# Patient Record
Sex: Female | Born: 1940 | Race: White | Hispanic: No | Marital: Married | Smoking: Former smoker
Health system: Southern US, Community
[De-identification: ages and names within clinical notes are randomized; demographics above are authoritative.]

## PROBLEM LIST (undated history)

## (undated) ENCOUNTER — Emergency Department (HOSPITAL_COMMUNITY): Admission: EM | Payer: Medicare Other

## (undated) DIAGNOSIS — Z923 Personal history of irradiation: Secondary | ICD-10-CM

## (undated) DIAGNOSIS — F419 Anxiety disorder, unspecified: Secondary | ICD-10-CM

## (undated) DIAGNOSIS — C3490 Malignant neoplasm of unspecified part of unspecified bronchus or lung: Secondary | ICD-10-CM

## (undated) DIAGNOSIS — J45909 Unspecified asthma, uncomplicated: Secondary | ICD-10-CM

## (undated) DIAGNOSIS — I10 Essential (primary) hypertension: Secondary | ICD-10-CM

## (undated) DIAGNOSIS — J449 Chronic obstructive pulmonary disease, unspecified: Secondary | ICD-10-CM

## (undated) DIAGNOSIS — Z8719 Personal history of other diseases of the digestive system: Secondary | ICD-10-CM

## (undated) DIAGNOSIS — G4730 Sleep apnea, unspecified: Secondary | ICD-10-CM

## (undated) HISTORY — PX: COLON RESECTION: SHX5231

## (undated) HISTORY — PX: CHOLECYSTECTOMY: SHX55

## (undated) HISTORY — DX: Essential (primary) hypertension: I10

## (undated) HISTORY — PX: OTHER SURGICAL HISTORY: SHX169

## (undated) HISTORY — DX: Unspecified asthma, uncomplicated: J45.909

## (undated) HISTORY — PX: TONSILLECTOMY: SUR1361

## (undated) HISTORY — PX: APPENDECTOMY: SHX54

---

## 2010-10-07 DIAGNOSIS — Z87442 Personal history of urinary calculi: Secondary | ICD-10-CM

## 2010-10-07 HISTORY — DX: Personal history of urinary calculi: Z87.442

## 2012-08-19 ENCOUNTER — Encounter: Payer: Self-pay | Admitting: Cardiovascular Disease

## 2016-01-01 DIAGNOSIS — G4733 Obstructive sleep apnea (adult) (pediatric): Secondary | ICD-10-CM | POA: Diagnosis present

## 2016-01-01 DIAGNOSIS — I493 Ventricular premature depolarization: Secondary | ICD-10-CM | POA: Insufficient documentation

## 2018-07-15 DIAGNOSIS — E669 Obesity, unspecified: Secondary | ICD-10-CM | POA: Insufficient documentation

## 2018-07-15 DIAGNOSIS — F17210 Nicotine dependence, cigarettes, uncomplicated: Secondary | ICD-10-CM | POA: Insufficient documentation

## 2019-03-08 ENCOUNTER — Telehealth: Payer: Self-pay | Admitting: Internal Medicine

## 2019-03-08 NOTE — Telephone Encounter (Signed)
Checked front desk and visit notes from [facility] were received. Called them at [phone] and talked with Jody, requested copy of chest xray results will be needed for the 03/10/19 appointment with Dr. Melvyn Novas. Jody said info will be faxed.  Called the patient to advise. She stated she will also bring her notes from her PCP to the visit as well.  Nothing further needed at this time.

## 2019-03-10 ENCOUNTER — Other Ambulatory Visit: Payer: Self-pay

## 2019-03-10 ENCOUNTER — Ambulatory Visit (INDEPENDENT_AMBULATORY_CARE_PROVIDER_SITE_OTHER): Payer: Medicare Other | Admitting: Internal Medicine

## 2019-03-10 ENCOUNTER — Encounter: Payer: Self-pay | Admitting: Internal Medicine

## 2019-03-10 VITALS — BP 136/76 | HR 70 | Temp 98.2°F | Ht 65.0 in | Wt 196.6 lb

## 2019-03-10 DIAGNOSIS — J449 Chronic obstructive pulmonary disease, unspecified: Secondary | ICD-10-CM | POA: Diagnosis not present

## 2019-03-10 DIAGNOSIS — F17210 Nicotine dependence, cigarettes, uncomplicated: Secondary | ICD-10-CM | POA: Diagnosis not present

## 2019-03-10 DIAGNOSIS — J441 Chronic obstructive pulmonary disease with (acute) exacerbation: Secondary | ICD-10-CM | POA: Insufficient documentation

## 2019-03-10 DIAGNOSIS — K432 Incisional hernia without obstruction or gangrene: Secondary | ICD-10-CM | POA: Insufficient documentation

## 2019-03-10 MED ORDER — BUDESONIDE-FORMOTEROL FUMARATE 160-4.5 MCG/ACT IN AERO: 2.0000 | INHALATION_SPRAY | Freq: Two times a day (BID) | RESPIRATORY_TRACT | 0 refills | Status: DC

## 2019-03-10 MED ORDER — BUDESONIDE-FORMOTEROL FUMARATE 160-4.5 MCG/ACT IN AERO: 2.0000 | INHALATION_SPRAY | Freq: Two times a day (BID) | RESPIRATORY_TRACT | 11 refills | Status: DC

## 2019-03-10 NOTE — Patient Instructions (Addendum)
The key is to stop smoking completely before smoking completely stops you! -For smoking cessation classes/info call [phone]      Plan A = Automatic = stop advair/ start Symbicort 160 Take 2 puffs first thing in am and then another 2 puffs about 12 hours later.    Work on inhaler technique:  relax and gently blow all the way out then take a nice smooth deep breath back in, triggering the inhaler at same time you start breathing in.  Hold for up to 5 seconds if you can. Blow out thru nose. Rinse and gargle with water when done.      Plan B = Backup Only use your albuterol inhaler as a rescue medication to be used if you can't catch your breath by resting or doing a relaxed purse lip breathing pattern.  - The less you use it, the better it will work when you need it. - Ok to use the inhaler up to 2 puffs  every 4 hours if you must but call for appointment if use goes up over your usual need - Don't leave home without it !!  (think of it like the spare tire for your car)   Plan C = Crisis - only use your albuterol nebulizer if you first try Plan B and it fails to help > ok to use the nebulizer up to every 4 hours but if start needing it regularly call for immediate appointment   Plan D = Doctor - call me if B and C not adequate  Plan E = ER - go to ER or call 911 if all else fails    Please remember to go to the lab department   for your tests - we will call you with the results when they are available.       Please schedule a follow up office visit in 4 weeks, sooner if needed  with all medications /inhalers/ solutions in hand so we can verify exactly what you are taking. This includes all medications from all doctors and over the counters

## 2019-03-10 NOTE — Assessment & Plan Note (Signed)
F/u by Alvino Chapel, Jule Ser   Would do better if loses wt and stops coughing but this is a tall order and suspect she'll be fine for surgery but would put off for now in absence of worse pain, obstructive symptoms.   Will readdress surgical clearance issues on return

## 2019-03-10 NOTE — Assessment & Plan Note (Signed)
Counseled re importance of smoking cessation but did not meet time criteria for separate billing    Total time devoted to counseling  > 50 % of initial 60 min office visit:  reviewed case with pt/   device teaching which extended face to face time for this visit discussion of options/alternatives/ personally creating written customized instructions  in presence of pt  then going over those specific  Instructions directly with the pt including how to use all of the meds but in particular covering each new medication in detail and the difference between the maintenance= "automatic" meds and the prns using an action plan format for the latter (If this problem/symptom => do that organization reading Left to right).  Please see AVS from this visit for a full list of these instructions which I personally wrote for this pt and  are unique to this visit.

## 2019-03-10 NOTE — Progress Notes (Signed)
Laurie Mccoy, female    DOB: 04/30/1941     MRN: [id number]   Brief patient profile:  89 yowf active smoker from [place] moved to [place] to live near her sons/grandchildren with onset around 1990 doe worse in hot/humid weather and with uri's much worse in winter of 2020 while on ACEi but blood pressure got so low and better since but wants to be cleared for umbilical surgery  in [place] wanted her cleared for 3rd surgery (original was for diverticulitis surgery) so referred to pulmonary clinic 03/10/2019 by Dr  Alvino Chapel.     History of Present Illness  03/10/2019  Pulmonary/ 1st office eval/Terricka Onofrio  Off acei x early May 2020  Chief Complaint  Patient presents with  . Pulmonary Consult    Referred by Dr. Charlane Ferretti for pulmonary clearance for hernia repair. Pt states she has had asthma "for a long time". She states having increased SOB since since she was sick in Feb 2020.   Dyspnea:  Slowed by feet > sob at present Cough: advair irritation mouth throat but really no cough since off acei  Sleep: on side electric bed just a little / cpap  SABA use: albuterol 4-5 mdi/ never neb anymore Singulair could not take bad dreams    No obvious day to day or daytime variability or assoc excess/ purulent sputum or mucus plugs or hemoptysis or cp or chest tightness, subjective wheeze or overt sinus or hb symptoms.   Sleeping ok  without nocturnal  or early am exacerbation  of respiratory  c/o's or need for noct saba. Also denies any obvious fluctuation of symptoms with weather or environmental changes or other aggravating or alleviating factors except as outlined above   No unusual exposure hx or h/o childhood pna/ asthma or knowledge of premature birth.  Current Allergies, Complete Past Medical History, Past Surgical History, Family History, and Social History were reviewed in [employer] record.  ROS  The following are not active complaints unless  bolded Hoarseness, sore throat, dysphagia, dental problems, itching, sneezing,  nasal congestion or discharge of excess mucus or purulent secretions, ear ache,   fever, chills, sweats, unintended wt loss or wt gain, classically pleuritic or exertional cp,  orthopnea pnd or arm/hand swelling  or leg swelling, presyncope, palpitations, abdominal pain, anorexia, nausea, vomiting, diarrhea  or change in bowel habits or change in bladder habits, change in stools or change in urine, dysuria, hematuria,  rash, arthralgias, visual complaints, headache, numbness, weakness or ataxia or problems with walking or coordination,  change in mood or  memory.          No past medical history on file.  Outpatient Medications Prior to Visit  Medication Sig Dispense Refill  . albuterol (PROAIR HFA) 108 (90 Base) MCG/ACT inhaler Inhale 2 puffs into the lungs every 4 (four) hours as needed for wheezing or shortness of breath.    . Fluticasone-Salmeterol (ADVAIR) 500-50 MCG/DOSE AEPB Inhale 1 puff into the lungs 2 (two) times daily.    [name] loratadine (CLARITIN) 10 MG tablet Take 10 mg by mouth daily.        Objective:     BP 136/76 (BP Location: Left Arm, Cuff Size: Normal)   Pulse 70   Temp 98.2 F (36.8 C) (Oral)   Ht 5\' 5"  (1.651 m)   Wt 196 lb 9.6 oz (89.2 kg)   SpO2 94%   BMI 32.72 kg/m   SpO2: 94 %  RA  Obese pleasant amb wf nad   HEENT: nl dentition, turbinates bilaterally, and oropharynx. Nl external ear canals without cough reflex   NECK :  without JVD/Nodes/TM/ nl carotid upstrokes bilaterally   LUNGS: no acc muscle use,  Nl contour chest Exp wheeze better with plm   without cough on insp or exp maneuvers   CV:  RRR  no s3 or murmur or increase in P2, and no edema   ABD:  Obese/soft and nontender with nl inspiratory excursion in the supine position. No bruits or organomegaly appreciated, bowel sounds nl - Ventral defect slt to R of midline scar   MS:  Nl gait/ ext warm without  deformities, calf tenderness, cyanosis or clubbing No obvious joint restrictions   SKIN: warm and dry without lesions    NEURO:  alert, approp, nl sensorium with  no motor or cerebellar deficits apparent.    cxr reported nl by pt w/in last 4 weeks of ov but not available on canopy to review      Assessment   COPD GOLD 3  Active smoker Spirometry 12/28/2018  FEV1 0.82 (37%)  Ratio 0.56 with classic curvature - d/c acei around 1st of May 2020 - d/c advair 500 03/10/2019   - 03/10/2019  After extensive coaching inhaler device,  effectiveness =    90% so try symbicort 160 2bid   I doubt she's as bad as spirometry suggests but clearly should not be continued on high dose advair having been successfully liberated from ACEi effects (which cause very similar symptoms)   rec symb 160 2bid  saba prn  Ov in 4 weeks with spirometry if COVID - 19 restrictions have been lifted.     Ventral hernia, recurrent F/u by Alvino Chapel, Jule Ser   Would do better if loses wt and stops coughing but this is a tall order and suspect she'll be fine for surgery but would put off for now in absence of worse pain, obstructive symptoms.   Will readdress surgical clearance issues on return     Cigarette smoker Counseled re importance of smoking cessation but did not meet time criteria for separate billing       Total time devoted to counseling  > 50 % of initial 60 min office visit:  reviewed case with pt/   device teaching which extended face to face time for this visit discussion of options/alternatives/ personally creating written customized instructions  in presence of pt  then going over those specific  Instructions directly with the pt including how to use all of the meds but in particular covering each new medication in detail and the difference between the maintenance= "automatic" meds and the prns using an action plan format for the latter (If this problem/symptom => do that organization  reading Left to right).  Please see AVS from this visit for a full list of these instructions which I personally wrote for this pt and  are unique to this visit.      Christinia Gully, MD 03/10/2019

## 2019-03-10 NOTE — Assessment & Plan Note (Signed)
Active smoker Spirometry 12/28/2018  FEV1 0.82 (37%)  Ratio 0.56 with classic curvature - d/c acei around 1st of May 2020 - d/c advair 500 03/10/2019   - 03/10/2019  After extensive coaching inhaler device,  effectiveness =    90% so try symbicort 160 2bid   I doubt she's as bad as spirometry suggests but clearly should not be continued on high dose advair having been successfully liberated from ACEi effects (which cause very similar symptoms)   rec symb 160 2bid  saba prn  Ov in 4 weeks with spirometry if COVID - 19 restrictions have been lifted.

## 2019-03-11 LAB — SAR COV2 SEROLOGY (COVID19)AB(IGG),IA: SARS CoV2 AB IGG: NEGATIVE

## 2019-03-11 NOTE — Progress Notes (Signed)
Spoke with pt and notified of results per Dr. Wert. Pt verbalized understanding and denied any questions. 

## 2019-03-17 LAB — ALPHA-1 ANTITRYPSIN PHENOTYPE: A-1 Antitrypsin, Ser: 129 mg/dL (ref 83–199)

## 2019-04-12 ENCOUNTER — Encounter: Payer: Self-pay | Admitting: Internal Medicine

## 2019-04-12 ENCOUNTER — Ambulatory Visit (INDEPENDENT_AMBULATORY_CARE_PROVIDER_SITE_OTHER): Payer: Medicare Other | Admitting: Internal Medicine

## 2019-04-12 ENCOUNTER — Other Ambulatory Visit: Payer: Self-pay

## 2019-04-12 DIAGNOSIS — F17210 Nicotine dependence, cigarettes, uncomplicated: Secondary | ICD-10-CM | POA: Diagnosis not present

## 2019-04-12 DIAGNOSIS — J449 Chronic obstructive pulmonary disease, unspecified: Secondary | ICD-10-CM | POA: Diagnosis not present

## 2019-04-12 MED ORDER — TRELEGY ELLIPTA 100-62.5-25 MCG/INH IN AEPB: 1.0000 | INHALATION_SPRAY | Freq: Every day | RESPIRATORY_TRACT | 11 refills | Status: DC

## 2019-04-12 MED ORDER — TRELEGY ELLIPTA 100-62.5-25 MCG/INH IN AEPB: 1.0000 | INHALATION_SPRAY | Freq: Every day | RESPIRATORY_TRACT | 0 refills | Status: DC

## 2019-04-12 NOTE — Progress Notes (Signed)
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     History of Present Illness  03/10/2019  Pulmonary/ 1st office eval/Laurie Mccoy  Off acei x early May 2020  Chief Complaint  Patient presents with  . Pulmonary Consult    Referred by Dr. Charlane Ferretti for pulmonary clearance for hernia repair. Pt states she has had asthma "for a long time". She states having increased SOB since since she was sick in Feb 2020.   Dyspnea:  Slowed by feet > sob at present Cough: advair irritation mouth throat but really no cough since off acei  Sleep: on side electric bed just a little / cpap  SABA use: albuterol 4-5 mdi/ never neb anymore Singulair could not take bad dreams   rec The key is to stop smoking completely before smoking completely stops you! -For smoking cessation classes/info call [phone]    Plan A = Automatic = stop advair/ start Symbicort 160 Take 2 puffs first thing in am and then another 2 puffs about 12 hours later.  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 Follow-up    Good days and bad days with her breathing- depends on the heat and humidity. She is using her proair 5 x daily on average. She has decided that she wants to stop smoking and  plans to stop end of this wk.   Dyspnea:  No regular ex/ limited by feet hurting x 1.5 y Cough: none Sleeping: on cpap does fines, but when starts day is sob SABA use: never needs at rest  02: none    No obvious day to day or daytime variability or assoc excess/ purulent sputum or mucus plugs or hemoptysis or cp or chest tightness, subjective wheeze or overt sinus or hb symptoms.   Sleeping as above without nocturnal   of respiratory  c/o's or need for noct saba. Also denies any obvious fluctuation of symptoms with weather or environmental changes or other aggravating or alleviating factors except as outlined above   No unusual exposure hx or h/o childhood pna/ asthma or knowledge of premature birth.  Current Allergies, Complete Past Medical History, Past Surgical History, Family History, and Social History were reviewed in [employer] record.  ROS  The following are not active complaints unless bolded Hoarseness, sore throat, dysphagia, dental problems, itching, sneezing,  nasal congestion or discharge of excess mucus or purulent secretions, ear ache,   fever, chills, sweats, unintended wt loss or wt gain, classically pleuritic or exertional cp,  orthopnea pnd or arm/hand swelling  or leg swelling, presyncope, palpitations, abdominal pain, anorexia, nausea, vomiting, diarrhea  or change in bowel habits or change  in bladder habits, change in stools or change in urine, dysuria, hematuria,  rash, arthralgias, visual complaints, headache, numbness, weakness or ataxia or problems with walking or coordination,  change in mood or  memory.        Current Meds  Medication Sig  . albuterol (PROAIR HFA) 108 (90 Base) MCG/ACT inhaler Inhale 2 puffs into the lungs every 4 (four) hours as needed for wheezing or shortness of breath.  . ALPRAZolam (XANAX) 0.25 MG tablet Take 0.25 mg by mouth 2 (two) times daily as needed for anxiety.  . budesonide-formoterol (SYMBICORT) 160-4.5  MCG/ACT inhaler Inhale 2 puffs into the lungs 2 (two) times daily.  [name] loratadine (CLARITIN) 10 MG tablet Take 10 mg by mouth daily.                      Objective:     pleasant amb wf nad  Wt Readings from Last 3 Encounters:  04/12/19 195 lb 12.8 oz (88.8 kg)  03/10/19 196 lb 9.6 oz (89.2 kg)     Vital signs reviewed - Note on arrival 02 sats  94% on RA      . HEENT: nl dentition / oropharynx. Nl external ear canals without cough reflex -  Mild bilateral non-specific turbinate edema     NECK :  without JVD/Nodes/TM/ nl carotid upstrokes bilaterally   LUNGS: no acc muscle use,  Min barrel  contour chest wall with bilateral  slightly decreased bs s audible wheeze and  without cough on insp or exp maneuver and min  Hyperresonant  to  percussion bilaterally     CV:  RRR  no s3 or murmur or increase in P2, and no edema   ABD:  Mod obese soft and nontender with pos end  insp Hoover's  in the supine position. No bruits or organomegaly appreciated, bowel sounds nl - ventral defect slt to right of midline scar  MS:   Nl gait/  ext warm without deformities, calf tenderness, cyanosis or clubbing No obvious joint restrictions   SKIN: warm and dry without lesions    NEURO:  alert, approp, nl sensorium with  no motor or cerebellar deficits apparent.         cxr 12/28/18 in care everywhere: nl        Assessment

## 2019-04-12 NOTE — Patient Instructions (Addendum)
Plan A = Automatic = Trelegy one click each  - take smooth deep breaths x 2 to make sure you get it all   Rinse and gargle when done   The key is to stop smoking completely before smoking completely stops you!      Plan B = Backup Only use your albuterol inhaler as a rescue medication to be used if you can't catch your breath by resting or doing a relaxed purse lip breathing pattern.  - The less you use it, the better it will work when you need it. - Ok to use the inhaler up to 2 puffs  every 4 hours if you must but call for appointment if use goes up over your usual need - Don't leave home without it !!  (think of it like the spare tire for your car)   Plan C = Crisis - only use your albuterol nebulizer if you first try Plan B and it fails to help > ok to use the nebulizer up to every 4 hours but if start needing it regularly call for immediate appointment    Please schedule a follow up visit in 3 months but call sooner if needed

## 2019-04-13 ENCOUNTER — Encounter: Payer: Self-pay | Admitting: Internal Medicine

## 2019-04-13 MED ORDER — ALBUTEROL SULFATE (2.5 MG/3ML) 0.083% IN NEBU: 2.5000 mg | INHALATION_SOLUTION | RESPIRATORY_TRACT | Status: DC | PRN

## 2019-04-13 NOTE — Assessment & Plan Note (Signed)
Active smoker Spirometry 12/28/2018  FEV1 0.82 (37%)  Ratio 0.56 with classic curvature - d/c acei around 1st of May 2020 - d/c advair 500 03/10/2019   - 03/10/2019    try symbicort 160 2bid  But worse sob, more saba by 04/12/2019  - alpha one AT  03/10/19   MM   Level 129  - 04/12/2019  After extensive coaching inhaler device,  effectiveness =    90% with elipta, try trelegy    Group D in terms of symptom/risk and laba/lama/ICS  therefore appropriate rx at this point >>>  trelegy one each am and see if improves ex tol, reduces saba use

## 2019-04-13 NOTE — Assessment & Plan Note (Signed)

## 2019-05-06 ENCOUNTER — Telehealth: Payer: Self-pay | Admitting: Internal Medicine

## 2019-05-06 NOTE — Telephone Encounter (Signed)
Left message for patient to call back  

## 2019-05-06 NOTE — Telephone Encounter (Signed)
Primary Pulmonologist: MW Last office visit and with whom: 04/12/2019 with MW What do we see them for (pulmonary problems): COPD Last OV assessment/plan: Instructions  Plan A = Automatic = Trelegy one click each  - take smooth deep breaths x 2 to make sure you get it all   Rinse and gargle when done   The key is to stop smoking completely before smoking completely stops you!     Plan B = Backup Only use your albuterol inhaler as a rescue medication to be used if you can't catch your breath by resting or doing a relaxed purse lip breathing pattern.  - The less you use it, the better it will work when you need it. - Ok to use the inhaler up to 2 puffs  every 4 hours if you must but call for appointment if use goes up over your usual need - Don't leave home without it !!  (think of it like the spare tire for your car)   Plan C = Crisis - only use your albuterol nebulizer if you first try Plan B and it fails to help > ok to use the nebulizer up to every 4 hours but if start needing it regularly call for immediate appointment    Please schedule a follow up visit in 3 months but call sooner if needed      Was appointment offered to patient (explain)?  Pt wants recommendations   Reason for call: Called and spoke with pt who believes she is having a reaction to the Trelegy inhaler.   Pt stated that she has had swelling in her legs, rapid heartbeat. Pt used to be on BP meds but stated that her BP had become too low so she was taken off of BP meds. From looking at [place] from another provider, at visit on 5/27 pt's lisinopril was not listed and it also was not on pt's first consult OV with MW nor was it on her med list last OV 7/6 with MW. Pt's BP two days ago was 175/75. I had pt take her BP while talking to her and her current BP was 181/85 and pulse is 101. Pt did take her lisinopril about an hour ago.  Pt did recheck her BP while I was still talking to her and the updated BP was 166/80  and pulse was 90.  Pt also has had a headache at the rt temple region. Pt said that her symptoms began 1 week after OV with MW 7/6.   Pt said that she did stop using the trelegy two days ago and began taking symbicort again. Pt has not had to use her rescue inhaler or neb machine any. Pt said that today, 7/30, her legs are not swollen and she does not feel like her heart is racing anymore.  Pt is still having a headache. Pt denies any current complaints of fever but stated a week ago she did have a temp and flu like symptoms and was tested for covid at an urgent care and the results did come back negative.  Pt denies any complaints of nausea or vomiting and also has no abdominal or muscle pain.  Pt does have complaints of feeling weak and being tired and also has an occ cough and will occ get up a small amount of clear phlegm.  Pt is wanting recs to help with her symptoms. Aaron Edelman, please advise on this for pt. Thanks!

## 2019-05-06 NOTE — Telephone Encounter (Signed)
Okay to stop Trelegy Ellipta inhaler, resume Symbicort 160.I would recommend the patient have a tele-visit or office visit with an APP to further evaluate symptoms  Wyn Quaker, FNP

## 2019-05-07 ENCOUNTER — Ambulatory Visit: Payer: Medicare Other | Admitting: Nurse Practitioner

## 2019-05-07 NOTE — Telephone Encounter (Signed)
Called spoke with patient , made OV today with TN to go over medication management.   Nothing further needed. Negative covid questions and negative swab last Friday.

## 2019-05-10 ENCOUNTER — Encounter: Payer: Self-pay | Admitting: Pulmonary Disease

## 2019-05-10 ENCOUNTER — Other Ambulatory Visit: Payer: Self-pay

## 2019-05-10 ENCOUNTER — Ambulatory Visit (INDEPENDENT_AMBULATORY_CARE_PROVIDER_SITE_OTHER): Payer: Medicare Other | Admitting: Pulmonary Disease

## 2019-05-10 VITALS — BP 138/78 | HR 78 | Temp 98.3°F | Ht 64.0 in | Wt 197.4 lb

## 2019-05-10 DIAGNOSIS — J449 Chronic obstructive pulmonary disease, unspecified: Secondary | ICD-10-CM

## 2019-05-10 DIAGNOSIS — F17210 Nicotine dependence, cigarettes, uncomplicated: Secondary | ICD-10-CM | POA: Diagnosis not present

## 2019-05-10 DIAGNOSIS — Z23 Encounter for immunization: Secondary | ICD-10-CM | POA: Diagnosis not present

## 2019-05-10 MED ORDER — BUDESONIDE-FORMOTEROL FUMARATE 160-4.5 MCG/ACT IN AERO: 2.0000 | INHALATION_SPRAY | Freq: Two times a day (BID) | RESPIRATORY_TRACT | 6 refills | Status: DC

## 2019-05-10 NOTE — Progress Notes (Signed)
@Patient  ID: Laurie Mccoy, female    DOB: Jan 29, 1941, 78 y.o.   MRN: [id number]  Chief Complaint  Patient presents with  . Follow-up    COPD, back on symbicort / had reaction trelegy ellipta    Referring provider: No ref. provider found  HPI:  78 year old female current every day smoker followed in our office for COPD  PMH: allergic rhinitis, remote episodes of depression Smoker/ Smoking History: Current every day smoker.  46-pack-year smoking history.  Smoking half pack per day. Maintenance: Symbicort 160 Pt of: Dr. Melvyn Novas  05/10/2019  - Visit   78 year old female current every day smoker followed in our office for suspected COPD.  Patient has a previous spirometry listed in our chart from March/2020 (she reports that this was actually done over a year ago) that shows an FEV1 of 0.82.  Patient has never had formal pulmonary function testing.  At last office visit in July/2020 patient was transitioned from Symbicort 160  to [place].  Patient reports that she took that for 2 to 3 weeks and tolerated quite well.  Patient reports that she had leg swelling as well as heart palpitations and chest pain.  Patient also reports that her blood pressure was quite elevated and she attributes this to her Trelegy Ellipta.  Patient then decided to take to lisinopril that she had from her previous prescription to help lower her blood pressure.  She did not notify our office or notify primary care to receive recommendations regarding blood pressure management.  MMRC - Breathlessness Score 2 - on level ground, I walk slower than people of the same age because of breathlessness, or have to stop for breathe when walking to my own pace   Tests:   03/10/2019-SARS-CoV-2-negative  03/10/2019-alpha-1-129, antitrypsin phenotype is PI*MM  Spirometry 12/28/2018  FEV1 0.82 (37%)  Ratio 0.56 with classic curvature  (Pt says this was over 1 year ago)   FENO:  No results found for: NITRICOXIDE  PFT: No  flowsheet data found.  Imaging: No results found.    Specialty Problems      Pulmonary Problems   COPD GOLD 3     Active smoker Spirometry 12/28/2018  FEV1 0.82 (37%)  Ratio 0.56 with classic curvature - d/c acei around 1st of May 2020 - d/c advair 500 03/10/2019   - 03/10/2019    try symbicort 160 2bid  But worse sob, more saba by 04/12/2019  - alpha one AT  03/10/19   MM   Level 129  - 04/12/2019  After extensive coaching inhaler device,  effectiveness =    90% with elipta, try trelegy  05/10/2019 >>> patient reported reaction to Trelegy Ellipta, transition back to Symbicort 05/10/2019 >>> pulmonary function testing ordered         Allergies  Allergen Reactions  . Compazine [Prochlorperazine Edisylate]     "I look like I have had a stroke"    Immunization History  Administered Date(s) Administered  . Influenza,inj,quad, With Preservative 09/17/2017  . Influenza-Unspecified 07/07/2018  . Pneumococcal Polysaccharide-23 05/10/2019    History reviewed. No pertinent past medical history.  Tobacco History: Social History   Tobacco Use  Smoking Status Current Every Day Smoker  . Packs/day: 1.00  . Years: 46.00  . Pack years: 46.00  Smokeless Tobacco Never Used   Ready to quit: No Counseling given: Yes  Smoking assessment and cessation counseling  Patient currently smoking: 0.5 ppd  I have advised the patient to quit/stop smoking as soon as possible  due to high risk for multiple medical problems.  It will also be very difficult for [place] to manage patient's  respiratory symptoms and status if we continue to expose her lungs to a known irritant.  We do not advise e-cigarettes as a form of stopping smoking.  Patient is not willing to quit smoking.  I have advised the patient that we can assist and have options of nicotine replacement therapy, provided smoking cessation education today, provided smoking cessation counseling, and provided cessation resources. Tried chantix - poor  reaction, didn't tolerate.   Follow-up next office visit office visit for assessment of smoking cessation.  Smoking cessation counseling advised for: 5 min    Outpatient Encounter Medications as of 05/10/2019  Medication Sig  . albuterol (PROAIR HFA) 108 (90 Base) MCG/ACT inhaler Inhale 2 puffs into the lungs every 4 (four) hours as needed for wheezing or shortness of breath.  [name] albuterol (PROVENTIL) (2.5 MG/3ML) 0.083% nebulizer solution Take 3 mLs (2.5 mg total) by nebulization every 4 (four) hours as needed for wheezing or shortness of breath.  . ALPRAZolam (XANAX) 0.25 MG tablet Take 0.25 mg by mouth 2 (two) times daily as needed for anxiety.  . budesonide-formoterol (SYMBICORT) 160-4.5 MCG/ACT inhaler Inhale 2 puffs into the lungs 2 (two) times daily.  [name] loratadine (CLARITIN) 10 MG tablet Take 10 mg by mouth daily.  . budesonide-formoterol (SYMBICORT) 160-4.5 MCG/ACT inhaler Inhale 2 puffs into the lungs 2 (two) times daily.  . Fluticasone-Umeclidin-Vilant (TRELEGY ELLIPTA) 100-62.5-25 MCG/INH AEPB Inhale 1 puff into the lungs daily.   No facility-administered encounter medications on file as of 05/10/2019.     Review of Systems  Review of Systems  Constitutional: Negative for activity change, fatigue and fever.  HENT: Negative for sinus pressure, sinus pain and sore throat.   Respiratory: Positive for shortness of breath. Negative for cough (occasionally productive cough with clear mucous) and wheezing.   Cardiovascular: Negative for chest pain, palpitations and leg swelling.  Gastrointestinal: Negative for diarrhea, nausea and vomiting.  Musculoskeletal: Positive for gait problem.       Nueropathy in feet  Neurological: Negative for dizziness.  Psychiatric/Behavioral: Negative for sleep disturbance. The patient is nervous/anxious.      Physical Exam  BP 138/78 (BP Location: Left Arm, Cuff Size: Normal)   Pulse 78   Temp 98.3 F (36.8 C) (Oral)   Ht 5\' 4"  (1.626 m)   Wt 197  lb 6.4 oz (89.5 kg)   SpO2 96%   BMI 33.88 kg/m   Wt Readings from Last 5 Encounters:  05/10/19 197 lb 6.4 oz (89.5 kg)  04/12/19 195 lb 12.8 oz (88.8 kg)  03/10/19 196 lb 9.6 oz (89.2 kg)    Physical Exam Vitals signs and nursing note reviewed.  Constitutional:      General: She is not in acute distress.    Appearance: Normal appearance.  HENT:     Head: Normocephalic and atraumatic.     Right Ear: Tympanic membrane, ear canal and external ear normal. There is no impacted cerumen.     Left Ear: Tympanic membrane, ear canal and external ear normal. There is no impacted cerumen.     Nose: Nose normal. No congestion or rhinorrhea.     Mouth/Throat:     Mouth: Mucous membranes are moist.     Pharynx: Oropharynx is clear.  Eyes:     Pupils: Pupils are equal, round, and reactive to light.  Neck:     Musculoskeletal: Normal range of  motion.  Cardiovascular:     Rate and Rhythm: Normal rate and regular rhythm.     Pulses: Normal pulses.     Heart sounds: Normal heart sounds. No murmur.  Pulmonary:     Effort: Pulmonary effort is normal. No respiratory distress.     Breath sounds: Normal breath sounds. No decreased air movement. No decreased breath sounds, wheezing or rales.     Comments: A few scattered rhonchi that clear with coughing Abdominal:     General: Abdomen is flat. Bowel sounds are normal.     Palpations: Abdomen is soft.     Comments: Obese abdomen  Musculoskeletal:     Right lower leg: No edema.     Left lower leg: No edema.  Skin:    General: Skin is warm and dry.     Capillary Refill: Capillary refill takes less than 2 seconds.  Neurological:     General: No focal deficit present.     Mental Status: She is alert and oriented to person, place, and time. Mental status is at baseline.     Gait: Gait normal.  Psychiatric:        Mood and Affect: Mood is anxious.        Behavior: Behavior normal.        Thought Content: Thought content normal.        Judgment:  Judgment normal.     Lab Results:  CBC No results found for: WBC, RBC, HGB, HCT, PLT, MCV, MCH, MCHC, RDW, LYMPHSABS, MONOABS, EOSABS, BASOSABS  BMET No results found for: NA, K, CL, CO2, GLUCOSE, BUN, CREATININE, CALCIUM, GFRNONAA, GFRAA  BNP No results found for: BNP  ProBNP No results found for: PROBNP    Assessment & Plan:   Cigarette smoker Discussed multiple times the need to stop smoking Patient is not interested in smoking cessation today Currently smoking half a pack a day  Plan: Emphasized the need to stop smoking Patient to have pulmonary function test Patient to contact our office if and when she decides to stop smoking Consider referral to lung cancer screening program based off of patient's pulmonary function test to evaluate for suspected emphysema  COPD GOLD 3  Plan: Continue Symbicort 160 Could consider transition to AstraZen[employer] triple therapy inhaler when released in September/2020 We will order pulmonary function testing Continue rescue inhaler use as needed Pneumovax 23 today     Return in about 6 weeks (around 06/21/2019), or if symptoms worsen or fail to improve, for Follow up with Dr. Melvyn Novas, Follow up for PFT.   Lauraine Rinne, NP 05/10/2019   This appointment was 32 minutes long with over 50% of the time in direct face-to-face patient care, assessment, plan of care, and follow-up.

## 2019-05-10 NOTE — Patient Instructions (Addendum)
Pneumovax23 today   Continue Symbicort 160 >>> 2 puffs in the morning right when you wake up, rinse out your mouth after use, 12 hours later 2 puffs, rinse after use >>> Take this daily, no matter what >>> This is not a rescue inhaler   Only use your albuterol as a rescue medication to be used if you can't catch your breath by resting or doing a relaxed purse lip breathing pattern.  - The less you use it, the better it will work when you need it. - Ok to use up to 2 puffs  every 4 hours if you must but call for immediate appointment if use goes up over your usual need - Don't leave home without it !!  (think of it like the spare tire for your car)    You need a pulmonary function test on return visit    We recommend that you stop smoking.  >>>You need to set a quit date >>>If you have friends or family who smoke, let them know you are trying to quit and not to smoke around you or in your living environment  Smoking Cessation Resources:  1 800 QUIT NOW  >>> Patient to call this resource and utilize it to help support her quit smoking >>> Keep up your hard work with stopping smoking  You can also contact the [facility] >>>For smoking cessation classes call [phone]  We do not recommend using e-cigarettes as a form of stopping smoking  You can sign up for smoking cessation support texts and information:  >>>https://smokefree.gov/smokefreetxt    Return in about 6 weeks (around 06/21/2019), or if symptoms worsen or fail to improve, for Follow up with Dr. Melvyn Novas, Follow up for PFT.     Coronavirus (COVID-19) Are you at risk?  Are you at risk for the Coronavirus (COVID-19)?  To be considered HIGH RISK for Coronavirus (COVID-19), you have to meet the following criteria:  . Traveled to [place], [place], [place], [place] or [place]; or in the [place] to [place], [place], [place], or [place]; and have fever, cough, and shortness of breath within the last  2 weeks of travel OR . Been in close contact with a person diagnosed with COVID-19 within the last 2 weeks and have fever, cough, and shortness of breath . IF YOU DO NOT MEET THESE CRITERIA, YOU ARE CONSIDERED LOW RISK FOR COVID-19.  What to do if you are HIGH RISK for COVID-19?  [name] If you are having a medical emergency, call 911. . Seek medical care right away. Before you go to a doctor's office, urgent care or emergency department, call ahead and tell them about your recent travel, contact with someone diagnosed with COVID-19, and your symptoms. You should receive instructions from your physician's office regarding next steps of care.  . When you arrive at healthcare provider, tell the healthcare staff immediately you have returned from visiting [place], [place], [place], [place] or [place]; or traveled in the [place] to [place], [place], [place], or [place]; in the last two weeks or you have been in close contact with a person diagnosed with COVID-19 in the last 2 weeks.   . Tell the health care staff about your symptoms: fever, cough and shortness of breath. . After you have been seen by a medical provider, you will be either: o Tested for (COVID-19) and discharged home on quarantine except to seek medical care if symptoms worsen, and asked to  -  Stay home and avoid contact with others until you get your results (4-5 days)  - Avoid travel on public transportation if possible (such as bus, train, or airplane) or o Sent to the Emergency Department by EMS for evaluation, COVID-19 testing, and possible admission depending on your condition and test results.  What to do if you are LOW RISK for COVID-19?  Reduce your risk of any infection by using the same precautions used for avoiding the common cold or flu:  [name] Wash your hands often with soap and warm water for at least 20 seconds.  If soap and water are not readily available, use an alcohol-based hand sanitizer with at least 60%  alcohol.  . If coughing or sneezing, cover your mouth and nose by coughing or sneezing into the elbow areas of your shirt or coat, into a tissue or into your sleeve (not your hands). . Avoid shaking hands with others and consider head nods or verbal greetings only. . Avoid touching your eyes, nose, or mouth with unwashed hands.  . Avoid close contact with people who are sick. . Avoid places or events with large numbers of people in one location, like concerts or sporting events. . Carefully consider travel plans you have or are making. . If you are planning any travel outside or inside the [place], visit the CDC's Travelers' Health webpage for the latest health notices. . If you have some symptoms but not all symptoms, continue to monitor at home and seek medical attention if your symptoms worsen. . If you are having a medical emergency, call 911.   [place] / e-Visit: [email or web address]         [facility]: [facility]: [phone]                   [facility]: [phone]           It is flu season:   >>> Best ways to protect herself from the flu: Receive the yearly flu vaccine, practice good hand hygiene washing with soap and also using hand sanitizer when available, eat a nutritious meals, get adequate rest, hydrate appropriately   Please contact the office if your symptoms worsen or you have concerns that you are not improving.   Thank you for choosing [facility] for your healthcare, and for allowing [place] to partner with you on your healthcare journey. I am thankful to be able to provide care to you today.   Wyn Quaker FNP-C

## 2019-05-10 NOTE — Assessment & Plan Note (Addendum)
Discussed multiple times the need to stop smoking Patient is not interested in smoking cessation today Currently smoking half a pack a day  Plan: Emphasized the need to stop smoking Patient to have pulmonary function test Patient to contact our office if and when she decides to stop smoking Consider referral to lung cancer screening program based off of patient's pulmonary function test to evaluate for suspected emphysema

## 2019-05-10 NOTE — Assessment & Plan Note (Signed)
Plan: Continue Symbicort 160 Could consider transition to AstraZeneca triple therapy inhaler when released in September/2020 We will order pulmonary function testing Continue rescue inhaler use as needed Pneumovax 23 today

## 2019-05-17 ENCOUNTER — Telehealth: Payer: Self-pay | Admitting: Internal Medicine

## 2019-05-17 NOTE — Telephone Encounter (Signed)
Laurie Mccoy, please advise if you have received a form from pt's insurance in regards to the Symbicort. Thanks!

## 2019-05-18 NOTE — Telephone Encounter (Signed)
Pt is calling for an update about faxed request from [facility] for Symbicort. Cb is [phone].

## 2019-05-18 NOTE — Telephone Encounter (Signed)
Spoke with the pt  She states we need to call her insurance to get her symbicort approved  ID number [id number]  [id number] Will need to call tomorrow

## 2019-05-18 NOTE — Telephone Encounter (Signed)
I do not have a form  I just checked Dr Gustavus Bryant lookat  I spoke with the pt and notified that I did not receive anything  Pt states that she is in her basement and needs [place] to call her back b/c she does not have good service  [facility]

## 2019-05-19 NOTE — Telephone Encounter (Addendum)
ATC pt back, line continued to ring without a VM, then went to busy signal. ATC pt back a second time and line went to busy signal almost immediately. WCB.

## 2019-05-19 NOTE — Telephone Encounter (Signed)
Called and spoke to pt. Informed pt we do not currently have Symbicort samples at this time but she can call back later to see if we get more. Also, inquired if pt would be willing to see if Dr. Melvyn Novas had a similar interim inhaler sample to use until we get a decision back about her Symbicort, pt refused. Will await decision of pt's Symbicort.

## 2019-05-19 NOTE — Telephone Encounter (Signed)
Returned call to patient. Patient was requesting to speak with Laurie Mccoy or Dr. Melvyn Mccoy due to her needing a PA approval  for Symbicort and she states her insurance has 'faxed [place] a form' on 3 separate days this week.  She doesn't understand why this has not been taken care of already.  Advised patient that note from today states one of our clinical staff had requested information from the pharmacy to start the PA process.  She didn't not understand why we didn't get the forms from the insurance company and why we needed to call the pharmacy. Explained to patient that the other nurse was most likely just trying to make sure we were able to get a form today in the event she was unable to locate the faxed form from the insurance company.  Advised patient I would go look for the form and call her back for an update.    Located the form and Benetta Spar assisted me in submitting the form online.  Contacted the patient to inform her we had submitted the PA and should receive a follow up hopefully by tomorrow and we would call her once we get feedback on the request. Patient states she contacted the pharmacy to 'see why we would request info from them and the pharmacy told her we did not need to call them about PA's and that anyone could fill out a simple form and it didn't have to be a doctor or a nurse.'    Patient asked about the process for PA requests and patient messages stating 'it doesn't seem to work.'  She was concerned about changing meds and now she is 'allergic' to trelegy and can't get the symbicort.  She states our processes don't work. Apologized to the patient for delays and advised I could have a manager contact her regarding her concerns if she desires.  Patient states she would like that.  Patient also requested she be given a copy of the PA form as she wishes to review what is required to complete these forms.  Wallene Dales will mail the patient a copy.   Will route to B. Rakestraw, RN for follow up  with patient concerns.

## 2019-05-19 NOTE — Telephone Encounter (Signed)
PT CALLING BACK  NEEDING TO SPEAK TO NURSE A/B THIS  SOMETHING A/B INSURANCE MIX/MESS UP SHE CAN BE REACHED @ [phone].Hillery Hunter

## 2019-05-19 NOTE — Telephone Encounter (Signed)
Called and spoke to patient.  Patient was upset about PA not resulting yet.  Patient gave Laurie Mccoy ID [id number].  I called [phone] and answered the pending questions.  Received approval with case ID [id number] with dates of approval until 05/18/2020.  Called and let patient know of approval. Nothing further needed at this time.

## 2019-05-19 NOTE — Telephone Encounter (Signed)
Before calling pt's insurance, I called pt's pharmacy to see if pt's symbicort was requiring a PA. Per pharmacy, the symbicort is requiring a PA to be done. I provided them with our office fax and they said that they would be sending the PA to our office. Will keep open and await fax.

## 2019-05-19 NOTE — Telephone Encounter (Signed)
Pt requesting a call back from [place] or Dr. Melvyn Novas regarding symbicort.  [phone].

## 2019-05-19 NOTE — Telephone Encounter (Signed)
PT CALLING TO SEE IF WE HAVE SAMPLES OF SYMBICORT SHE CAN BE REACHED @ [phone].Laurie Mccoy

## 2019-05-23 NOTE — Progress Notes (Signed)
Chart and office note reviewed in detail  > agree with a/p as outlined    

## 2019-07-10 ENCOUNTER — Other Ambulatory Visit (HOSPITAL_COMMUNITY): Payer: Medicare Other

## 2019-07-13 ENCOUNTER — Ambulatory Visit: Payer: Medicare Other | Admitting: Internal Medicine

## 2019-07-15 ENCOUNTER — Telehealth: Payer: Self-pay | Admitting: Internal Medicine

## 2019-07-15 ENCOUNTER — Other Ambulatory Visit: Payer: Self-pay | Admitting: Internal Medicine

## 2019-07-15 MED ORDER — PREDNISONE 10 MG PO TABS: ORAL_TABLET | ORAL | 0 refills | Status: DC

## 2019-07-15 NOTE — Telephone Encounter (Signed)
My avs says we changed trelegy  To symbicort due to reaction to trelegy so we're studck fuor now  But would leave off symbicort and just take albuterol prn with hfa or neb form in short run  [place] to use benadryl prn   If not improving:  Prednisone 10 mg take  4 each am x 2 days,   2 each am x 2 days,  1 each am x 2 days and stop     Ov w/in 2 weeks with all meds in hand to regroup

## 2019-07-15 NOTE — Telephone Encounter (Signed)
Called and spoke to pt. Pt states she has been taking her Symbicort for about a month and feels she is having a reaction to it. Pt c/o slight increase in SOB, low back aches, joint pain, hoarseness, fatigue, mild dizziness x 1 week. Pt also noticed a small rash on her arm that has been present for a couple days. Pt denies swelling, f/c/s, CP/tightness. Pt states she has not stopped the Symbicort, I advised her to stop until we hear from MW. Pt states she also has not take an antihistamine but has Benadryl at home. Pt states she does not feel this warrants and ED or UC visit, she would like recs from MW.   Dr. Melvyn Novas please advise. Thanks.

## 2019-07-15 NOTE — Telephone Encounter (Signed)
Called and spoke to pt. Informed her of the recs per MW. Rx sent to preferred pharmacy. Pt also states she recently took her pressure and it was 169/78 and HR was 88,advised pt to keep an eye on her pressure and if it doesn't go down or increase to then call her PCP or seek emergency care. Pt verbalized understanding. Pt requesting to keep this 'on file'. Pt has been scheduled to see MW on 10/21. Nothing further needed at this time.

## 2019-07-28 ENCOUNTER — Other Ambulatory Visit: Payer: Self-pay

## 2019-07-28 ENCOUNTER — Ambulatory Visit (INDEPENDENT_AMBULATORY_CARE_PROVIDER_SITE_OTHER): Payer: Medicare Other | Admitting: Internal Medicine

## 2019-07-28 ENCOUNTER — Encounter: Payer: Self-pay | Admitting: Internal Medicine

## 2019-07-28 DIAGNOSIS — J449 Chronic obstructive pulmonary disease, unspecified: Secondary | ICD-10-CM

## 2019-07-28 DIAGNOSIS — I10 Essential (primary) hypertension: Secondary | ICD-10-CM

## 2019-07-28 DIAGNOSIS — F17210 Nicotine dependence, cigarettes, uncomplicated: Secondary | ICD-10-CM | POA: Diagnosis not present

## 2019-07-28 MED ORDER — TELMISARTAN 40 MG PO TABS: 40.0000 mg | ORAL_TABLET | Freq: Every day | ORAL | 2 refills | Status: DC

## 2019-07-28 NOTE — Progress Notes (Signed)
Laurie Mccoy, female    DOB: 1941/04/27     MRN: [id number]   Brief patient profile:  34 yowf active smoker prev worked with GP's  from [place] moved to [place] to live near her sons/grandchildren with onset around 1990 doe worse in hot/humid weather and with uri's much worse in winter of 2020 while on ACEi but blood pressure got so low and better since but wants to be cleared for umbilical surgery  in [place] wanted her cleared for 3rd surgery (original was for diverticulitis surgery) so referred to pulmonary clinic 03/10/2019 by Dr  Alvino Chapel.     History of Present Illness  03/10/2019  Pulmonary/ 1st office eval/Laurie Mccoy  Off acei x early May 2020  Chief Complaint  Patient presents with  . Pulmonary Consult    Referred by Dr. Charlane Ferretti for pulmonary clearance for hernia repair. Pt states she has had asthma "for a long time". She states having increased SOB since since she was sick in Feb 2020.   Dyspnea:  Slowed by feet > sob at present Cough: advair irritation mouth throat but really no cough since off acei  Sleep: on side electric bed just a little / cpap  SABA use: albuterol 4-5 mdi/ never neb anymore Singulair could not take bad dreams   rec The key is to stop smoking completely before smoking completely stops you! -For smoking cessation classes/info call [phone]    Plan A = Automatic = stop advair/ start Symbicort 160 Take 2 puffs first thing in am and then another 2 puffs about 12 hours later.  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 Follow-up    Good days and bad days with her breathing- depends on the heat and humidity. She is using her proair 5 x daily on average. She has decided that she wants to stop smoking and  plans to stop end of this wk.   Dyspnea:  No regular ex/ limited by feet hurting x 1.5 y Cough: none Sleeping: on cpap does fine, but when starts day is sob SABA use: never needs at rest  02: none  rec Plan A = Automatic = Trelegy one click each  - take smooth deep breaths x 2 to make sure you get it all  Rinse and gargle when done  The key is to stop smoking completely before smoking completely stops you! Plan B = Backup Only use your albuterol inhaler as a rescue medication Plan C = Crisis - only use your albuterol nebulizer if you first try Plan B  07/26/2019 eval at [facility] for atypical cp migratory / upper abd also  Listed as taking lisinopril but hasn't started it  Nl trop/ probnp / cxr  rec re start lisinopril See cards > scheduled for 07/30/19 and no cp since   07/28/2019  f/u ov/Laurie Mccoy re: GOLD III copd / can't tol trelegy or symbicort so just using saba hfa tid Chief Complaint  Patient presents with  . Follow-up  Dyspnea:  Still limited by feet/ able to walk  slow pace ok flat surfaces  MMRC2 = can't walk a nl pace on a flat grade s sob but does fine slow and flat  Cough:  none Sleeping: on cpap  SABA use: 3 x day hfa/ no neb needed  02: none    No obvious day to day or daytime variability or assoc excess/ purulent sputum or mucus plugs or hemoptysis or   chest tightness, subjective wheeze or overt sinus or hb symptoms.   Sleeping  without nocturnal  or early am exacerbation  of respiratory  c/o's or need for noct saba. Also denies any obvious fluctuation of symptoms with weather or environmental changes or other aggravating or alleviating factors except as outlined above   No unusual exposure hx or h/o childhood pna/ asthma or knowledge of premature birth.  Current Allergies, Complete Past Medical History, Past Surgical History, Family History, and Social History were reviewed in [employer] record.  ROS  The following are not active  complaints unless bolded Hoarseness, sore throat, dysphagia, dental problems, itching, sneezing,  nasal congestion or discharge of excess mucus or purulent secretions, ear ache,   fever, chills, sweats, unintended wt loss or wt gain, classically pleuritic or exertional cp,  orthopnea pnd or arm/hand swelling  or leg swelling, presyncope, palpitations, abdominal pain, anorexia, nausea, vomiting, diarrhea  or change in bowel habits or change in bladder habits, change in stools or change in urine, dysuria, hematuria,  rash, arthralgias, visual complaints, headache, numbness, weakness or ataxia or problems with walking or coordination,  change in mood or  memory.        Current Meds  Medication Sig  . albuterol (PROAIR HFA) 108 (90 Base) MCG/ACT inhaler Inhale 2 puffs into the lungs every 4 (four) hours as needed for wheezing or shortness of breath.  . loratadine (CLARITIN) 10 MG tablet Take 10 mg by mouth daily.                            Objective:        Wt Readings from Last 3 Encounters:  07/28/19 198 lb (89.8 kg)  05/10/19 197 lb 6.4 oz (89.5 kg)  04/12/19 195 lb 12.8 oz (88.8 kg)     Vital signs reviewed - Note on arrival 02 sats  96% on RA and bp  160/74    Somewhat anxious but pleasant amb wf nad   HEENT : pt wearing mask not removed for exam due to covid - 19 concerns.   NECK :  without JVD/Nodes/TM/ nl carotid upstrokes bilaterally   LUNGS: no acc muscle use,  Min barrel  contour chest wall with bilateral  slightly decreased bs s audible wheeze and  without cough on insp or exp maneuvers and min  Hyperresonant  to  percussion bilaterally     CV:  RRR  no s3 or murmur or increase in P2, and no edema   ABD:  soft and nontender with pos end  insp Hoover's  in the supine position. No bruits or organomegaly appreciated, bowel sounds nl  MS:   Nl gait/  ext warm without deformities, calf tenderness, cyanosis or clubbing No obvious joint restrictions   SKIN: warm  and dry without lesions    NEURO:  alert, approp, nl sensorium with  no motor or cerebellar deficits apparent.         I personally reviewed images and agree with radiology impression as follows:  CXR:   08/05/19  No acute findings      Assessment

## 2019-07-28 NOTE — Patient Instructions (Addendum)
Don't take lisinopril   Micardis 40 mg one half daily up to 2 whole pills  daily goal is to keep bp under 135 and 90 on bottom    Please schedule a follow up visit in 3 months but call sooner if needed  - add: correctioin: f/u 1 m with pfts same day

## 2019-07-29 ENCOUNTER — Encounter: Payer: Self-pay | Admitting: Internal Medicine

## 2019-07-29 DIAGNOSIS — I10 Essential (primary) hypertension: Secondary | ICD-10-CM | POA: Insufficient documentation

## 2019-07-29 NOTE — Assessment & Plan Note (Signed)
Counseled re importance of smoking cessation but did not meet time criteria for separate billing   °

## 2019-07-29 NOTE — Assessment & Plan Note (Addendum)
ACE inhibitors are problematic in  pts with airway complaints because  even experienced pulmonologists can't always distinguish ace effects from copd/asthma.  By themselves they don't actually cause a problem, much like oxygen can't by itself start a fire, but they certainly serve as a powerful catalyst or enhancer for any "fire"  or inflammatory process in the upper airway, be it caused by an ET  tube or more commonly reflux (especially in the obese or pts with known GERD or who are on biphoshonates).    In the era of ARB near equivalency until we have a better handle on airways dz rec micaridis 40 mg - she can take one half to 2 and self titrate as tends to have intolerance to stronger rx   >>> f/u with cards as planned 07/29/2019

## 2019-07-29 NOTE — Assessment & Plan Note (Addendum)
Active smoker Spirometry 12/28/2018  FEV1 0.82 (37%)  Ratio 0.56 with classic curvature - d/c acei around 1st of May 2020 - d/c advair 500 03/10/2019   - 03/10/2019    try symbicort 160 2bid  But worse sob, more saba by 04/12/2019  - alpha one AT  03/10/19   MM   Level 129  - 04/12/2019  After extensive coaching inhaler device,  effectiveness =    90% with elipta, try trelegy  05/10/2019 >>> patient reported reaction to Trelegy Ellipta, transition back to Symbicort> could not tolerate 05/10/2019 >>> pulmonary function testing ordered  Although she has GOLD III she is more limited by feet and not having tendency to aecopd off all maint rx.  Since can't tol either trelegy or symbicort ok with me just to use saba prn as long as not trending up in dependency or requiring neb saba/ reviewed    >>> f/u with pfts in 1 m

## 2019-07-29 NOTE — Progress Notes (Signed)
Cardiology Office Note:   Date:  07/30/2019  NAME:  Laurie Mccoy    MRN: [id number] DOB:  Apr 21, 1941   PCP:  [name], NP  Cardiologist:  No primary care provider on file.  Electrophysiologist:  None   Referring MD: [name], NP   Chief Complaint  Patient presents with  . Chest Pain   History of Present Illness:   Laurie Mccoy is a 78 y.o. female with a hx of hypertension, tobacco abuse, obesity who is being seen today for the evaluation of chest pain at the request of White, Delmar Landau, NP.  She reports she was seen in the emergency room on Monday for acute onset of chest tightness that also went into her neck.  She reports the pain was achy and sharp and also went into her back.  She reports this was around 6:30 in the evening and she was with significant stress at that time.  She had an extended weekend with many visitors in her home.  She reports a similar episode a number of years ago and it resolved and was attributed to anxiety.  The pain she had was better with an acid and she took 2 aspirin at the discretion of her husband.  She did go to the emergency room and ruled out for myocardial infarction.  She had no acute ischemic EKG changes.  She elected not to be admitted to undergo stress test.  Overall, she reports she is in fairly good health.  She reports she is not diabetic she does have a history of high blood pressure but takes medications infrequently.  Her EKG demonstrates no ischemic changes today no prior infarction.  She had a single PAC noted.  She reports she does not exercise routinely due to pain in her legs she has great pulses on exam, but does have some arthritis she is dealing with.  She also reports a long history of asthma since moving back to [place] from [place].  She reports the weather here does not conducive for her sinuses.  She is finally stopped all of her allergy medications and seems to be doing much better.  She reports when she does  exert herself she does not routinely get chest pain or chest pressure.  She does not really get short of breath either with activity.  She is inquired about pursuing activity that is better on the joints.  She has 3 sons and lives with her husband.  She does report her diet could be better and there can work on this.  Review of her laboratory data from her primary care physician shows a serum creatinine of 0.64, LDL 62, HDL 80!!,  TSH 3.4.  Past Medical History: Past Medical History:  Diagnosis Date  . Asthma   . Hypertension     Past Surgical History: Past Surgical History:  Procedure Laterality Date  . APPENDECTOMY    . CHOLECYSTECTOMY    . COLON RESECTION    . hernia      Current Medications: Current Meds  Medication Sig  . albuterol (PROAIR HFA) 108 (90 Base) MCG/ACT inhaler Inhale 2 puffs into the lungs every 4 (four) hours as needed for wheezing or shortness of breath.  [name] albuterol (PROVENTIL) (2.5 MG/3ML) 0.083% nebulizer solution Take 3 mLs (2.5 mg total) by nebulization every 4 (four) hours as needed for wheezing or shortness of breath.  . ALPRAZolam (XANAX) 0.25 MG tablet Take 0.25 mg by mouth 2 (two) times daily as needed  for anxiety.  [name] loratadine (CLARITIN) 10 MG tablet Take 10 mg by mouth daily.  [name] telmisartan (MICARDIS) 40 MG tablet Take 1 tablet (40 mg total) by mouth daily.     Allergies:    Compazine [prochlorperazine edisylate]   Social History: Social History   Socioeconomic History  . Marital status: Married    Spouse name: Not on file  . Number of children: Not on file  . Years of education: Not on file  . Highest education level: Not on file  Occupational History  . Not on file  Social Needs  . Financial resource strain: Not on file  . Food insecurity    Worry: Not on file    Inability: Not on file  . Transportation needs    Medical: Not on file    Non-medical: Not on file  Tobacco Use  . Smoking status: Current Every Day Smoker    Packs/day:  1.00    Years: 46.00    Pack years: 46.00  . Smokeless tobacco: Never Used  Substance and Sexual Activity  . Alcohol use: Not on file  . Drug use: Not on file  . Sexual activity: Not on file  Lifestyle  . Physical activity    Days per week: Not on file    Minutes per session: Not on file  . Stress: Not on file  Relationships  . Social Herbalist on phone: Not on file    Gets together: Not on file    Attends religious service: Not on file    Active member of club or organization: Not on file    Attends meetings of clubs or organizations: Not on file    Relationship status: Not on file  Other Topics Concern  . Not on file  Social History Narrative  . Not on file    Family History: The patient's family history includes Brain cancer in her mother; Lung cancer in her father and mother; Stroke in her brother.  ROS:   All other ROS reviewed and negative. Pertinent positives noted in the HPI.     EKGs/Labs/Other Studies Reviewed:   The following studies were personally reviewed by me today:  EKG:  EKG is ordered today.  The ekg ordered today demonstrates normal sinus rhythm, heart rate 82, PACs noted, no evidence of ischemia or prior infarction, and was personally reviewed by me.   Recent Labs: No results found for requested labs within last 8760 hours.   Recent Lipid Panel No results found for: CHOL, TRIG, HDL, CHOLHDL, VLDL, LDLCALC, LDLDIRECT  Physical Exam:   VS:  BP 128/62   Pulse 82   Ht 5\' 4"  (1.626 m)   Wt 197 lb (89.4 kg)   BMI 33.81 kg/m    Wt Readings from Last 3 Encounters:  07/30/19 197 lb (89.4 kg)  07/28/19 198 lb (89.8 kg)  05/10/19 197 lb 6.4 oz (89.5 kg)    General: Well nourished, well developed, in no acute distress Heart: Atraumatic, normal size  Eyes: PEERLA, EOMI  Neck: Supple, no JVD Endocrine: No thryomegaly Cardiac: Normal S1, S2; RRR; no murmurs, rubs, or gallops Lungs: Clear to auscultation bilaterally, no wheezing, rhonchi or  rales  Abd: Soft, nontender, no hepatomegaly  Ext: No edema, pulses 2+ Musculoskeletal: No deformities, BUE and BLE strength normal and equal Skin: Warm and dry, no rashes   Neuro: Alert and oriented to person, place, time, and situation, CNII-XII grossly intact, no focal deficits  Psych: Normal mood  and affect   ASSESSMENT:   Raphael Espe is a 78 y.o. female who presents for the following: 1. Precordial pain   2. Essential hypertension   3. Tobacco abuse     PLAN:   1. Precordial pain -She presents with a one-time episode of pretty atypical pain.  I suspect this is anxiety related or possibly GERD related.  It did improve with acid reflux medications and aspirin.  She does have risk factors including heavy smoking history.  And I discussed with her that women have quite varying presentations of coronary artery disease.  Therefore I think it is reasonable to proceed with a coronary CTA.  We will give her diltiazem 120 mg immediate release tablet 1 hour before scan.  She will get a BMP 1 week before the scan.  We will plan to see her back in 3 months after the scan. -She has great pulses on exam and no evidence of a carotid bruit.  I have a low suspicion for PAD given her very benign exam  2. Essential hypertension -Stable today off medication  3. Tobacco abuse -Counseled the importance of smoking cessation  Disposition: Return in about 3 months (around 10/30/2019).  Medication Adjustments/Labs and Tests Ordered: Current medicines are reviewed at length with the patient today.  Concerns regarding medicines are outlined above.  Orders Placed This Encounter  Procedures  . CT CORONARY MORPH W/CTA COR W/SCORE W/CA W/CM &/OR WO/CM  . CT CORONARY FRACTIONAL FLOW RESERVE DATA PREP  . CT CORONARY FRACTIONAL FLOW RESERVE FLUID ANALYSIS  . Basic metabolic panel  . EKG 12-Lead   Meds ordered this encounter  Medications  . diltiazem (CARDIZEM) 120 MG tablet    Sig: Take 1 tablet (120  mg total) by mouth once for 1 dose.    Dispense:  1 tablet    Refill:  0    Patient Instructions  Medication Instructions:  Take Diltiazem 120 mg 1 hour before the CTA. *If you need a refill on your cardiac medications before your next appointment, please call your pharmacy*  Lab Work: BMET one week before CTA is scheduled. If you have labs (blood work) drawn today and your tests are completely normal, you will receive your results only by: [name] MyChart Message (if you have MyChart) OR . A paper copy in the mail If you have any lab test that is abnormal or we need to change your treatment, we will call you to review the results.  Testing/Procedures: Your physician has requested that you have cardiac CT. Cardiac computed tomography (CT) is a painless test that uses an x-ray machine to take clear, detailed pictures of your heart. For further information please visit HugeFiesta.tn. Please follow instruction sheet as given.  Follow-Up: At [facility], you and your health needs are our priority.  As part of our continuing mission to provide you with exceptional heart care, we have created designated Provider Care Teams.  These Care Teams include your primary Cardiologist (physician) and Advanced Practice Providers (APPs -  Physician Assistants and Nurse Practitioners) who all work together to provide you with the care you need, when you need it.  Your next appointment:   3 months  The format for your next appointment:   In Person  Provider:   Dr.O'Neal  Other Instructions Your cardiac CT will be scheduled at one of the below locations:   [facility] [address] [place], [place] [address] [phone]  If scheduled at [facility], please  arrive at the [facility] main entrance of [facility] 30-45 minutes prior to test start time. Proceed to the [facility] Radiology Department (first floor) to check-in and test prep.  If scheduled at  [facility], please arrive 15 mins early for check-in and test prep.  Please follow these instructions carefully (unless otherwise directed):  Hold all erectile dysfunction medications at least 3 days (72 hrs) prior to test.  On the Night Before the Test: . Be sure to Drink plenty of water. . Do not consume any caffeinated/decaffeinated beverages or chocolate 12 hours prior to your test. . Do not take any antihistamines 12 hours prior to your test.  On the Day of the Test: . Drink plenty of water. Do not drink any water within one hour of the test. . Do not eat any food 4 hours prior to the test. . You may take your regular medications prior to the test.  . Take metoprolol (Lopressor) two hours prior to test. . HOLD Furosemide/Hydrochlorothiazide morning of the test. . FEMALES- please wear underwire-free bra if available       After the Test: . Drink plenty of water. . After receiving IV contrast, you may experience a mild flushed feeling. This is normal. . On occasion, you may experience a mild rash up to 24 hours after the test. This is not dangerous. If this occurs, you can take Benadryl 25 mg and increase your fluid intake. . If you experience trouble breathing, this can be serious. If it is severe call 911 IMMEDIATELY. If it is mild, please call our office. . If you take any of these medications: Glipizide/Metformin, Avandament, Glucavance, please do not take 48 hours after completing test unless otherwise instructed.   Once we have confirmed authorization from your insurance company, we will call you to set up a date and time for your test.   For non-scheduling related questions, please contact the cardiac imaging nurse navigator should you have any questions/concerns: Marchia Bond, RN Navigator Cardiac Imaging [facility] Heart and Vascular Services [phone] Office        Signed, Addison Naegeli. Audie Box, [place]  [address] [place], [place] [address] [phone]  07/30/2019 11:09 AM

## 2019-07-30 ENCOUNTER — Other Ambulatory Visit: Payer: Self-pay

## 2019-07-30 ENCOUNTER — Ambulatory Visit (INDEPENDENT_AMBULATORY_CARE_PROVIDER_SITE_OTHER): Payer: Medicare Other | Admitting: Cardiovascular Disease

## 2019-07-30 ENCOUNTER — Encounter: Payer: Self-pay | Admitting: Cardiovascular Disease

## 2019-07-30 VITALS — BP 128/62 | HR 82 | Ht 64.0 in | Wt 197.0 lb

## 2019-07-30 DIAGNOSIS — R072 Precordial pain: Secondary | ICD-10-CM

## 2019-07-30 DIAGNOSIS — Z720 Tobacco use: Secondary | ICD-10-CM | POA: Diagnosis not present

## 2019-07-30 DIAGNOSIS — I10 Essential (primary) hypertension: Secondary | ICD-10-CM | POA: Diagnosis not present

## 2019-07-30 MED ORDER — DILTIAZEM HCL 120 MG PO TABS: 120.0000 mg | ORAL_TABLET | Freq: Once | ORAL | 0 refills | Status: DC

## 2019-07-30 NOTE — Patient Instructions (Signed)
Medication Instructions:  Take Diltiazem 120 mg 1 hour before the CTA. *If you need a refill on your cardiac medications before your next appointment, please call your pharmacy*  Lab Work: BMET one week before CTA is scheduled. If you have labs (blood work) drawn today and your tests are completely normal, you will receive your results only by: [name] MyChart Message (if you have MyChart) OR . A paper copy in the mail If you have any lab test that is abnormal or we need to change your treatment, we will call you to review the results.  Testing/Procedures: Your physician has requested that you have cardiac CT. Cardiac computed tomography (CT) is a painless test that uses an x-ray machine to take clear, detailed pictures of your heart. For further information please visit HugeFiesta[facility]tn. Please follow instruction sheet as given.  Follow-Up: At [facility], you and your health needs are our priority.  As part of our continuing mission to provide you with exceptional heart care, we have created designated Provider Care Teams.  These Care Teams include your primary Cardiologist (physician) and Advanced Practice Providers (APPs -  Physician Assistants and Nurse Practitioners) who all work together to provide you with the care you need, when you need it.  Your next appointment:   3 months  The format for your next appointment:   In Person  Provider:   Dr.O'Neal  Other Instructions Your cardiac CT will be scheduled at one of the below locations:   [facility] [address] [place], [place] [address] [phone]  If scheduled at [facility], please arrive at the [facility] main entrance of [facility] 30-45 minutes prior to test start time. Proceed to the [facility] Radiology Department (first floor) to check-in and test prep.  If scheduled at [facility], please arrive 15 mins early for check-in and test prep.  Please follow  these instructions carefully (unless otherwise directed):  Hold all erectile dysfunction medications at least 3 days (72 hrs) prior to test.  On the Night Before the Test: . Be sure to Drink plenty of water. . Do not consume any caffeinated/decaffeinated beverages or chocolate 12 hours prior to your test. . Do not take any antihistamines 12 hours prior to your test.  On the Day of the Test: . Drink plenty of water. Do not drink any water within one hour of the test. . Do not eat any food 4 hours prior to the test. . You may take your regular medications prior to the test.  . Take metoprolol (Lopressor) two hours prior to test. . HOLD Furosemide/Hydrochlorothiazide morning of the test. . FEMALES- please wear underwire-free bra if available       After the Test: . Drink plenty of water. . After receiving IV contrast, you may experience a mild flushed feeling. This is normal. . On occasion, you may experience a mild rash up to 24 hours after the test. This is not dangerous. If this occurs, you can take Benadryl 25 mg and increase your fluid intake. . If you experience trouble breathing, this can be serious. If it is severe call 911 IMMEDIATELY. If it is mild, please call our office. . If you take any of these medications: Glipizide/Metformin, Avandament, Glucavance, please do not take 48 hours after completing test unless otherwise instructed.   Once we have confirmed authorization from your insurance company, we will call you to set up a date and time for your test.  For non-scheduling related questions, please contact the cardiac imaging nurse navigator should you have any questions/concerns: Marchia Bond, RN Navigator Cardiac Imaging [facility] Heart and Vascular Services [phone] Office

## 2019-08-06 ENCOUNTER — Telehealth: Payer: Self-pay | Admitting: Internal Medicine

## 2019-08-06 NOTE — Telephone Encounter (Signed)
Left the information below on the patient vmail per DPR giving permission for detailed messages.  Asked for the patient to call back to confirm the message has been received.  Message left in triage as an FYI upon call having been returned.

## 2019-08-06 NOTE — Telephone Encounter (Signed)
No safe options for a maintenance inhaler so I rec just stay on albuterol hfa prn and neb to back it up - if starts needing the neb more ofter we need her back in the office right away, otherwise we can address this concern when comes in for pfts  Even maintence meds for copd just the treat the symptoms, not the disease, so it's not like going off a statin or a bp med as long as the symptoms don't worsen.

## 2019-08-06 NOTE — Telephone Encounter (Signed)
Call returned to patient, confirmed DOB, patient states she asked that Dr. Melvyn Novas or Aaron Edelman give her a call. I made her aware they are seeing patients in clinic.   She states she was taking off her inhalers due to allergies. She only has been using her pro-air. She is wanting recommendations where she feels like she needs a maintenance inhaler. She reports she has only used the nebulizer once this month.   Dr. Melvyn Novas please advise patient states due to being allergic to every inhaler we have placed her on what other recommendations do we have on the days where she feels like she needs something more than the pro-air.

## 2019-08-06 NOTE — Telephone Encounter (Signed)
Spoke with the pt  She received the detailed msg that Hinton Dyer left her with Dr Gustavus Bryant response and there is nothing further needed at this time

## 2019-08-06 NOTE — Telephone Encounter (Signed)
Pt returned missed call and would like a call back from Dr.Wert or [facility].Please advise

## 2019-08-12 ENCOUNTER — Telehealth: Payer: Self-pay | Admitting: Internal Medicine

## 2019-08-12 ENCOUNTER — Encounter: Payer: Self-pay | Admitting: Internal Medicine

## 2019-08-12 ENCOUNTER — Ambulatory Visit (INDEPENDENT_AMBULATORY_CARE_PROVIDER_SITE_OTHER): Payer: Medicare Other | Admitting: Internal Medicine

## 2019-08-12 DIAGNOSIS — I10 Essential (primary) hypertension: Secondary | ICD-10-CM

## 2019-08-12 DIAGNOSIS — J449 Chronic obstructive pulmonary disease, unspecified: Secondary | ICD-10-CM | POA: Diagnosis not present

## 2019-08-12 DIAGNOSIS — F17210 Nicotine dependence, cigarettes, uncomplicated: Secondary | ICD-10-CM

## 2019-08-12 NOTE — Assessment & Plan Note (Addendum)
D/c ACEi  07/28/19 due to sob / hoarseness   Pt says bp fine on nothing but advised her to monitor and start micardis 40 mg one daily if rises.  Although even in retrospect it may not be clear the ACEi contributed to the pt's symptoms,  adding them back at this point or in the future would risk confusion in interpretation of non-specific respiratory symptoms to which this patient is prone  ie  Better not to muddy the waters here.    Each maintenance medication was reviewed in detail including most importantly the difference between maintenance and as needed and under what circumstances the prns are to be used.  Please see AVS for specific  Instructions which are unique to this visit and I personally typed out  which were reviewed in detail over the phone with the patient and a copy provided per MyChart

## 2019-08-12 NOTE — Assessment & Plan Note (Addendum)
Active smoker Spirometry 12/28/2018  FEV1 0.82 (37%)  Ratio 0.56 with classic curvature - d/c acei around 1st of May 2020 - d/c advair 500 03/10/2019   - 03/10/2019    try symbicort 160 2bid  But worse sob, more saba by 04/12/2019  - alpha one AT  03/10/19   MM   Level 129  - 04/12/2019  After extensive coaching inhaler device,  effectiveness =    90% with elipta, try trelegy  05/10/2019 >>> patient reported reaction to Trelegy Ellipta = thush/ palpitations, transition back to Symbicort 05/10/2019 >>> pulmonary function testing ordered  It may well be the best choice for her is lama as can't tol ics or laba and since she is familiar with elipta rec try incruse one click am pending pfts and short term:  Prednisone 10 mg take  4 each am x 2 days,   2 each am x 2 days,  1 each am x 2 days and stop and continue saba prn / complete cards w/u as planned - if worse in meantime or no convincing improvement on pred then add empirical gerd rx otc pending pfts based on  Studies  Which in some series show up to 75% of pts  report no assoc GI/ Heartburn symptoms but present with atypical wheezing or atypical cp, both which may apply here.

## 2019-08-12 NOTE — Progress Notes (Addendum)
Laurie Mccoy, female    DOB: 11-Aug-1941     MRN: [id number]   Brief patient profile:  5 yowf active smoker prev worked with GP's  from [place] moved to [place] to live near her sons/grandchildren with onset around 1990 doe worse in hot/humid weather and with uri's much worse in winter of 2020 while on ACEi but blood pressure got so low and better since but wants to be cleared for umbilical surgery  in [place] wanted her cleared for 3rd surgery (original was for diverticulitis surgery) so referred to pulmonary clinic 03/10/2019 by Dr  Alvino Chapel.     History of Present Illness  03/10/2019  Pulmonary/ 1st office eval/Ariane Ditullio  Off acei x early May 2020  Chief Complaint  Patient presents with  . Pulmonary Consult    Referred by Dr. Charlane Ferretti for pulmonary clearance for hernia repair. Pt states she has had asthma "for a long time". She states having increased SOB since since she was sick in Feb 2020.   Dyspnea:  Slowed by feet > sob at present Cough: advair irritation mouth throat but really no cough since off acei  Sleep: on side electric bed just a little / cpap  SABA use: albuterol 4-5 mdi/ never neb anymore Singulair could not take bad dreams   rec The key is to stop smoking completely before smoking completely stops you! Plan A = Automatic = stop advair/ start Symbicort 160 Take 2 puffs first thing in am and then another 2 puffs about 12 hours later.  Work on inhaler technique:    Plan B = Backup Only use your albuterol inhaler  Plan C = Crisis - only use your albuterol nebulizer if you first try Plan B   04/12/2019  f/u ov/Ghada Abbett re: copd GOLD III maint on symb / still smoking Chief Complaint  Patient presents with  . Follow-up    Good days and bad days with her breathing- depends on the heat and humidity. She is using her proair 5 x daily on average. She has decided that she wants to stop smoking and plans to stop end of this wk.   Dyspnea:  No regular ex/  limited by feet hurting x 1.5 y Cough: none Sleeping: on cpap does fine, but when starts day is sob SABA use: never needs at rest  02: none  rec Plan A = Automatic = Trelegy one click each  - take smooth deep breaths x 2 to make sure you get it all  Rinse and gargle when done  The key is to stop smoking completely before smoking completely stops you! Plan B = Backup Only use your albuterol inhaler as a rescue medication Plan C = Crisis - only use your albuterol nebulizer if you first try Plan B  07/26/2019 eval at [facility] for atypical cp migratory / upper abd also  Listed as taking lisinopril but had not started it  Nl trop/ probnp / cxr  rec re start lisinopril See cards > scheduled for 07/30/19 and no cp since      07/28/2019  f/u ov/Denzell Colasanti re: GOLD III copd / can't tol trelegy or symbicort so just using saba hfa tid Chief Complaint  Patient presents with  . Follow-up  Dyspnea:  Still limited by feet/ able to walk  slow pace ok flat surfaces  MMRC2 = can't walk a nl pace on a flat grade s sob but does fine slow and flat  Cough: none Sleeping: on cpap  SABA use: 3 x day hfa/ no neb needed  02: none  rec Don't take lisinopril  Micardis 40 mg one half daily up to 2 whole pills  daily goal is to keep bp under 135 and 90 on bottom    Virtual Visit via Telephone Note 08/12/2019   I connected with Laurie Mccoy on 08/12/19 at  4:25  PM EST by telephone and verified that I am speaking with the correct person using two identifiers.   I discussed the limitations, risks, security and privacy concerns of performing an evaluation and management service by telephone and the availability of in person appointments. I also discussed with the patient that there may be a patient responsible charge related to this service. The patient expressed understanding and agreed to proceed.   History of Present Illness: Dyspnea:  More doe, Assoc with wheezing  Cough: minimal non  productive  Sleeping: ok on cpap then wheezing when takes it off  SABA use: 3-4 x per day  02: none    No obvious day to day or daytime variability or assoc excess/ purulent sputum or mucus plugs or hemoptysis or cp or chest tightness,   overt sinus or hb symptoms (though has atypical cp relieve by Tums per cards)     Also denies any obvious fluctuation of symptoms with weather or environmental changes or other aggravating or alleviating factors except as outlined above.   Meds reviewed/ med reconciliation completed       Observations/Objective: Speaking in full sentences, no spont cough, some hoarseness   Assessment and Plan: See problem list for active a/p's   Follow Up Instructions: See avs for instructions unique to this ov which includes revised/ updated med list     I discussed the assessment and treatment plan with the patient. The patient was provided an opportunity to ask questions and all were answered. The patient agreed with the plan and demonstrated an understanding of the instructions.   The patient was advised to call back or seek an in-person evaluation if the symptoms worsen or if the condition fails to improve as anticipated.  I provided 25  minutes of non-face-to-face time during this encounter.   Christinia Gully, MD

## 2019-08-12 NOTE — Assessment & Plan Note (Signed)
Has used Chantix in the past and had a psychiatric reaction to it  Counseled re importance of smoking cessation but did not meet time criteria for separate billing

## 2019-08-12 NOTE — Telephone Encounter (Signed)
Called and spoke to patient. Patient stated that she is having increased shortness of breath and wheezing. Patient stated the wheezing has been bad and that she is using nebulizer/rescue inhaler very frequently. Sometimes using as frequently as 20 minutes apart but at the longest 2 hours between.  Patient is also having a non-productive cough.  Patient stated overall she had felt better with being off the inhalers.  Scheduled patient for televisit with Dr. Melvyn Novas. Nothing further needed at this time.

## 2019-08-12 NOTE — Patient Instructions (Addendum)
Prednisone 10 mg take  4 each am x 2 days,   2 each am x 2 days,  1 each am x 2 days and stop - this should minimize the need for your albuterol but you should always keep it on hand for breathing problems  that don't resolve  after resting or occur at rest or at night.   If not convincingly better : Try prilosec otc 20mg   Take 30-60 min before first meal of the day and Pepcid ac (famotidine) 20 mg one @  bedtime until cough is completely gone for at least a week without the need for cough suppression.   If your blood blood pressure goes back up add back ibasartan 40 mg daily (Micardis) which should be at your drugstore.  Don't take your inhalers or nebs the am of the pfts if at all  Possible.   The key is to stop smoking completely before smoking completely stops you!  For smoking cessation information call [phone]  Add:  Ok to start incruse if cough not an issue

## 2019-08-13 ENCOUNTER — Telehealth: Payer: Self-pay | Admitting: *Deleted

## 2019-08-13 MED ORDER — INCRUSE ELLIPTA 62.5 MCG/INH IN AEPB: 1.0000 | INHALATION_SPRAY | Freq: Every morning | RESPIRATORY_TRACT | 11 refills | Status: DC

## 2019-08-13 MED ORDER — PREDNISONE 10 MG PO TABS: ORAL_TABLET | ORAL | 0 refills | Status: DC

## 2019-08-13 NOTE — Telephone Encounter (Signed)
Spoke with the pt and notified of recs per MW  She verbalized understanding  She states that the pred taper was never sent to pharm  This was on her AVS from yesterday but never sent  Rx sent  Nothing further needed

## 2019-08-13 NOTE — Telephone Encounter (Signed)
-----   Message from Tanda Rockers, MD sent at 08/13/2019  5:06 AM EST ----- After review of her records, let  her know the best choice of a maintenance inhaler for now for her is Incruse since she did not tolerate the other ingredients in Trelegy but liked the benefit the best  As long as not coughing rec incruse one puff each am - I already sent it in.

## 2019-08-13 NOTE — Addendum Note (Signed)
Addended by: Christinia Gully B on: 08/13/2019 05:10 AM   Modules accepted: Orders

## 2019-08-20 ENCOUNTER — Telehealth: Payer: Self-pay | Admitting: Internal Medicine

## 2019-08-20 MED ORDER — ALBUTEROL SULFATE HFA 108 (90 BASE) MCG/ACT IN AERS: 2.0000 | INHALATION_SPRAY | RESPIRATORY_TRACT | 2 refills | Status: DC | PRN

## 2019-08-20 NOTE — Telephone Encounter (Signed)
Pharmacy is [place] [place].

## 2019-08-20 NOTE — Telephone Encounter (Signed)
Refill of pt's proair inhaler sent to pt's preferred pharmacy. Called and spoke with pt letting her know this had been done and pt verbalized understanding. Nothing further needed.

## 2019-08-24 ENCOUNTER — Other Ambulatory Visit (HOSPITAL_COMMUNITY): Admission: RE | Admit: 2019-08-24 | Payer: Medicare Other | Source: Ambulatory Visit

## 2019-08-24 ENCOUNTER — Other Ambulatory Visit (HOSPITAL_COMMUNITY): Payer: Medicare Other

## 2019-08-27 ENCOUNTER — Ambulatory Visit: Payer: Medicare Other | Admitting: Internal Medicine

## 2019-08-30 ENCOUNTER — Ambulatory Visit (INDEPENDENT_AMBULATORY_CARE_PROVIDER_SITE_OTHER): Payer: Medicare Other | Admitting: Internal Medicine

## 2019-08-30 ENCOUNTER — Other Ambulatory Visit: Payer: Self-pay

## 2019-08-30 ENCOUNTER — Encounter: Payer: Self-pay | Admitting: Internal Medicine

## 2019-08-30 DIAGNOSIS — J449 Chronic obstructive pulmonary disease, unspecified: Secondary | ICD-10-CM

## 2019-08-30 DIAGNOSIS — F17210 Nicotine dependence, cigarettes, uncomplicated: Secondary | ICD-10-CM | POA: Diagnosis not present

## 2019-08-30 MED ORDER — TRELEGY ELLIPTA 100-62.5-25 MCG/INH IN AEPB: 1.0000 | INHALATION_SPRAY | Freq: Every day | RESPIRATORY_TRACT | Status: DC

## 2019-08-30 MED ORDER — PREDNISONE 10 MG PO TABS: ORAL_TABLET | ORAL | 0 refills | Status: DC

## 2019-08-30 MED ORDER — CEFDINIR 300 MG PO CAPS: 300.0000 mg | ORAL_CAPSULE | Freq: Two times a day (BID) | ORAL | 0 refills | Status: DC

## 2019-08-30 NOTE — Progress Notes (Signed)
Laurie Mccoy, female    DOB: 06-19-1941     MRN: [id number]   Brief patient profile:  80 yowf active smoker prev worked with GP's  from [place] moved to [place] to live near her sons/grandchildren with onset around 1990 doe worse in hot/humid weather and with uri's much worse in winter of 2020 while on ACEi but blood pressure got so low and better since but wants to be cleared for umbilical surgery  in [place] wanted her cleared for 3rd surgery (original was for diverticulitis surgery) so referred to pulmonary clinic 03/10/2019 by Dr  Alvino Chapel.     History of Present Illness  03/10/2019  Pulmonary/ 1st office eval/Wert  Off acei x early May 2020  Chief Complaint  Patient presents with  . Pulmonary Consult    Referred by Dr. Charlane Ferretti for pulmonary clearance for hernia repair. Pt states she has had asthma "for a long time". She states having increased SOB since since she was sick in Feb 2020.   Dyspnea:  Slowed by feet > sob at present Cough: advair irritation mouth throat but really no cough since off acei  Sleep: on side electric bed just a little / cpap  SABA use: albuterol 4-5 mdi/ never neb anymore Singulair could not take bad dreams   rec The key is to stop smoking completely before smoking completely stops you! Plan A = Automatic = stop advair/ start Symbicort 160 Take 2 puffs first thing in am and then another 2 puffs about 12 hours later.  Work on inhaler technique:    Plan B = Backup Only use your albuterol inhaler  Plan C = Crisis - only use your albuterol nebulizer if you first try Plan B   04/12/2019  f/u ov/Wert re: copd GOLD III maint on symb / still smoking Chief Complaint  Patient presents with  . Follow-up    Good days and bad days with her breathing- depends on the heat and humidity. She is using her proair 5 x daily on average. She has decided that she wants to stop smoking and plans to stop end of this wk.   Dyspnea:  No regular ex/  limited by feet hurting x 1.5 y Cough: none Sleeping: on cpap does fine, but when starts day is sob SABA use: never needs at rest  02: none  rec Plan A = Automatic = Trelegy one click each  - take smooth deep breaths x 2 to make sure you get it all  Rinse and gargle when done  The key is to stop smoking completely before smoking completely stops you! Plan B = Backup Only use your albuterol inhaler as a rescue medication Plan C = Crisis - only use your albuterol nebulizer if you first try Plan B  07/26/2019 eval at [facility] for atypical cp migratory / upper abd also  Listed as taking lisinopril but had not started it  Nl trop/ probnp / cxr  rec re start lisinopril See cards > scheduled for 07/30/19 and no cp since      07/28/2019  f/u ov/Wert re: GOLD III copd / can't tol trelegy or symbicort so just using saba hfa tid Chief Complaint  Patient presents with  . Follow-up  Dyspnea:  Still limited by feet/ able to walk  slow pace ok flat surfaces  MMRC2 = can't walk a nl pace on a flat grade s sob but does fine slow and flat  Cough: none Sleeping: on cpap  SABA use: 3 x day hfa/ no neb needed  02: none  rec Don't take lisinopril  Micardis 40 mg one half daily up to 2 whole pills  daily goal is to keep bp under 135 and 90 on bottom    Virtual Visit via Telephone Note 08/12/2019  History of Present Illness: Dyspnea:  More doe, Assoc with wheezing  Cough: minimal non productive  Sleeping: ok on cpap then wheezing when takes it off  SABA use: 3-4 x per day  02: none  rec Prednisone 10 mg take  4 each am x 2 days,   2 each am x 2 days,  1 each am x 2 days and stop  If not convincingly better : Try prilosec otc 20mg   Take 30-60 min before first meal of the day and Pepcid ac (famotidine) 20 mg one @  bedtime  If your blood blood pressure goes back up add back ibasartan 40 mg daily (Micardis) which should be at your drugstore. Don't take your inhalers or nebs the am of  the pfts if at all  Possible.  The key is to stop smoking completely before smoking completely stops you! Add:  Ok to start incruse if cough not an issue    Virtual Visit via Telephone Note 08/30/2019   I connected with Laurie Mccoy on 08/30/19 at 11:40 AM EST by telephone and verified that I am speaking with the correct person using two identifiers.   I discussed the limitations, risks, security and privacy concerns of performing an evaluation and management service by telephone and the availability of in person appointments. I also discussed with the patient that there may be a patient responsible charge related to this service. The patient expressed understanding and agreed to proceed.   History of Present Illness: Prednisone x 6 days 80% better added  prilosec pepcid per recs and then quite a bit worse no better on incruse and now coughing atin  Dyspnea:  MMRC1 = can walk nl pace, flat grade, can't hurry or go uphills or steps s sob  - can do basement to first floor s stopping  Cough: maybe a half a tsp beige in evening mostly, not in am Sleeping: doing great cpap but w/in 5 min p stir  Severe sob resolves in 5 min s rx  SABA use: only with ex and better w/in 5 m but also resting 02: nne   No obvious day to day or daytime variability or assoc  mucus plugs or hemoptysis or cp or chest tightness, s overt sinus or hb symptoms.    Also denies any obvious fluctuation of symptoms with weather or environmental changes or other aggravating or alleviating factors except as outlined above.   Meds reviewed/ med reconciliation completed         Observations/Objective: Moderately hoarse with mostly dry sounding cough but able to finish full sentences   Assessment and Plan: See problem list for active a/p's   Follow Up Instructions: See avs for instructions unique to this ov which includes revised/ updated med list     I discussed the assessment and treatment plan with the  patient. The patient was provided an opportunity to ask questions and all were answered. The patient agreed with the plan and demonstrated an understanding of the instructions.   The patient was advised to call back or seek an in-person evaluation if the symptoms worsen or if the condition fails to improve as anticipated.  I provided 25 minutes of non-face-to-face  time during this encounter.   Christinia Gully, MD

## 2019-08-30 NOTE — Patient Instructions (Addendum)
I spent extra time with pt today reviewing appropriate use of albuterol for prn use on exertion with the following points: 1) saba is for relief of sob that does not improve by walking a slower pace or resting but rather if the pt does not improve after trying this first. 2) If the pt is convinced, as many are, that saba helps recover from activity faster then it's easy to tell if this is the case by re-challenging : ie stop, take the inhaler, then p 5 minutes try the exact same activity (intensity of workload) that just caused the symptoms and see if they are substantially diminished or not after saba 3) if there is an activity that reproducibly causes the symptoms, try the saba 15 min before the activity on alternate days   If in fact the saba really does help, then fine to continue to use it prn but advised may need to look closer at the maintenance regimen being used to achieve better control of airways disease with exertion.  Prednisone 10 mg take  4 each am x 2 days,   2 each am x 2 days,  1 each am x 2 days and stop   If mucus gets nasty  >  omnicef 300 mg twice daily x 10 days    Start Trelegy at end of Prednisone one click each am

## 2019-08-30 NOTE — Assessment & Plan Note (Addendum)
Active smoker Spirometry 12/28/2018  FEV1 0.82 (37%)  Ratio 0.56 with classic curvature - d/c acei around 1st of May 2020 - d/c advair 500 03/10/2019   - 03/10/2019    try symbicort 160 2bid  But worse sob, more saba by 04/12/2019  - alpha one AT  03/10/19   MM   Level 129  - 04/12/2019  After extensive coaching inhaler device,  effectiveness =    90% with elipta, try trelegy  05/10/2019 >>> patient reported reaction to Trelegy Ellipta = thush/ palpitations, transition back to Symbicort 05/10/2019 >>> pulmonary function testing ordered 08/12/2019 rec incruse one click each am> cough worse, did not do as well when d/c pred despite rx for GERD   Group D in terms of symptom/risk and laba/lama/ICS  therefore appropriate rx at this point >>>  rechallenge with trelegy plus have on hand pred/ omnicef in event of a flare over the holidays  Also: Try albuterol 15 min before an activity that you know would make you short of breath and see if it makes any difference and if makes none then don't take it after activity unless you can't catch your breath.        Each maintenance medication was reviewed in detail including most importantly the difference between maintenance and as needed and under what circumstances the prns are to be used.  Please see AVS for specific  Instructions which are unique to this visit and I personally typed out  which were reviewed in detail over the phone with the patient and a copy provided myChart

## 2019-08-30 NOTE — Assessment & Plan Note (Signed)
Counseled re importance of smoking cessation but did not meet time criteria for separate billing   °

## 2019-09-15 ENCOUNTER — Ambulatory Visit: Payer: Medicare Other | Admitting: Primary Care

## 2019-09-17 ENCOUNTER — Telehealth: Payer: Self-pay | Admitting: Cardiovascular Disease

## 2019-09-17 NOTE — Telephone Encounter (Signed)
Pt is coming to the [facility] office today for BMET prior to her CT 09/23/19.

## 2019-09-17 NOTE — Telephone Encounter (Signed)
New Message  I have [name] on the phone and she would like to speak with one of Dr. Kathalene Frames nurse regarding labs and bloodwork   Please call to discuss

## 2019-09-18 LAB — BASIC METABOLIC PANEL
BUN/Creatinine Ratio: 14 (ref 12–28)
BUN: 9 mg/dL (ref 8–27)
CO2: 23 mmol/L (ref 20–29)
Calcium: 9.6 mg/dL (ref 8.7–10.3)
Chloride: 102 mmol/L (ref 96–106)
Creatinine, Ser: 0.64 mg/dL (ref 0.57–1.00)
GFR calc Af Amer: 100 mL/min/{1.73_m2} (ref >59)
GFR calc non Af Amer: 87 mL/min/{1.73_m2} (ref >59)
Glucose: 110 mg/dL — ABNORMAL HIGH (ref 65–99)
Potassium: 4.9 mmol/L (ref 3.5–5.2)
Sodium: 139 mmol/L (ref 134–144)

## 2019-09-22 ENCOUNTER — Telehealth (HOSPITAL_COMMUNITY): Payer: Self-pay | Admitting: Emergency Medicine

## 2019-09-22 ENCOUNTER — Encounter (HOSPITAL_COMMUNITY): Payer: Self-pay

## 2019-09-22 NOTE — Telephone Encounter (Signed)
Pt returning phone call regarding upcoming cardiac imaging study; pt verbalizes understanding of appt date/time, parking situation and where to check in, pre-test NPO status and medications ordered, and verified current allergies; name and call back number provided for further questions should they arise Sanii Kukla RN Navigator Cardiac Imaging [facility] [phone] office [phone] cell   

## 2019-09-23 ENCOUNTER — Ambulatory Visit (HOSPITAL_COMMUNITY)
Admission: RE | Admit: 2019-09-23 | Discharge: 2019-09-23 | Disposition: A | Discharge status: Home / Self Care | Payer: Medicare Other | Source: Ambulatory Visit | Attending: Cardiovascular Disease | Admitting: Cardiovascular Disease

## 2019-09-23 ENCOUNTER — Other Ambulatory Visit: Payer: Self-pay

## 2019-09-23 ENCOUNTER — Telehealth: Payer: Self-pay | Admitting: Cardiovascular Disease

## 2019-09-23 DIAGNOSIS — R072 Precordial pain: Secondary | ICD-10-CM | POA: Diagnosis not present

## 2019-09-23 DIAGNOSIS — Z720 Tobacco use: Secondary | ICD-10-CM | POA: Insufficient documentation

## 2019-09-23 DIAGNOSIS — I10 Essential (primary) hypertension: Secondary | ICD-10-CM | POA: Diagnosis present

## 2019-09-23 MED ORDER — NITROGLYCERIN 0.4 MG SL SUBL: 0.8000 mg | SUBLINGUAL_TABLET | Freq: Once | SUBLINGUAL | Status: DC

## 2019-09-23 MED ORDER — IOHEXOL 350 MG/ML SOLN
80.0000 mL | Freq: Once | INTRAVENOUS | Status: AC | PRN
  Administered 2019-09-23: 80 mL via INTRAVENOUS

## 2019-09-23 MED ORDER — NITROGLYCERIN 0.4 MG SL SUBL
SUBLINGUAL_TABLET | SUBLINGUAL | Status: AC
  Filled 2019-09-23: qty 2

## 2019-09-23 NOTE — Telephone Encounter (Signed)
Spoke to  From [facility]. Michela Pitcher that it is a routine call to let Laurie Mccoy know that there were no acute cardiac findings on her CTA. Advised her to send the results to office fax number.

## 2019-09-24 ENCOUNTER — Telehealth: Payer: Self-pay | Admitting: Cardiovascular Disease

## 2019-09-24 DIAGNOSIS — I2510 Atherosclerotic heart disease of native coronary artery without angina pectoris: Secondary | ICD-10-CM

## 2019-09-24 DIAGNOSIS — R911 Solitary pulmonary nodule: Secondary | ICD-10-CM

## 2019-09-24 MED ORDER — ROSUVASTATIN CALCIUM 10 MG PO TABS: 10.0000 mg | ORAL_TABLET | Freq: Every day | ORAL | 3 refills | Status: DC

## 2019-09-24 MED ORDER — ASPIRIN 81 MG PO CHEW: 81.0000 mg | CHEWABLE_TABLET | Freq: Once | ORAL | Status: DC

## 2019-09-24 NOTE — Telephone Encounter (Signed)
Patient called and updated. -W

## 2019-09-24 NOTE — Telephone Encounter (Signed)
Called Mrs. Roscoe about CTA. Non-obstructive. Will start ASA and crestor. She wants to start low dose. Will start 10 mg QHS.   Incompletely imagine RUL lung nodule. I discussed she needs dedicated CT chest. She has a pulmonologist, Dr. Melvyn Novas with [facility]. Will send him information to get this imaged and evaluated.   She was in agreement with this plan.   [place] T. Audie Box, [place]  [address], [place], [place] [address] [phone]  8:45 AM

## 2019-09-30 ENCOUNTER — Other Ambulatory Visit: Payer: Self-pay | Admitting: Internal Medicine

## 2019-09-30 DIAGNOSIS — R911 Solitary pulmonary nodule: Secondary | ICD-10-CM

## 2019-10-05 ENCOUNTER — Ambulatory Visit (HOSPITAL_COMMUNITY)
Admission: RE | Admit: 2019-10-05 | Discharge: 2019-10-05 | Disposition: A | Discharge status: Home / Self Care | Payer: Medicare Other | Source: Ambulatory Visit | Attending: Cardiovascular Disease | Admitting: Cardiovascular Disease

## 2019-10-05 ENCOUNTER — Other Ambulatory Visit: Payer: Self-pay

## 2019-10-05 DIAGNOSIS — R911 Solitary pulmonary nodule: Secondary | ICD-10-CM | POA: Insufficient documentation

## 2019-10-06 ENCOUNTER — Encounter: Payer: Self-pay | Admitting: Internal Medicine

## 2019-10-06 DIAGNOSIS — E041 Nontoxic single thyroid nodule: Secondary | ICD-10-CM | POA: Insufficient documentation

## 2019-10-06 DIAGNOSIS — R918 Other nonspecific abnormal finding of lung field: Secondary | ICD-10-CM | POA: Insufficient documentation

## 2019-10-15 ENCOUNTER — Other Ambulatory Visit: Payer: Self-pay | Admitting: Internal Medicine

## 2019-10-18 ENCOUNTER — Telehealth: Payer: Self-pay | Admitting: Internal Medicine

## 2019-10-18 NOTE — Telephone Encounter (Signed)
I called pt & rescheduled her covid test.  Nothing further needed.

## 2019-10-19 ENCOUNTER — Other Ambulatory Visit (HOSPITAL_COMMUNITY): Payer: Medicare Other

## 2019-10-22 ENCOUNTER — Ambulatory Visit: Payer: Medicare Other | Admitting: Internal Medicine

## 2019-10-22 ENCOUNTER — Encounter: Payer: Medicare Other | Admitting: Internal Medicine

## 2019-10-26 ENCOUNTER — Other Ambulatory Visit: Payer: Self-pay | Admitting: Internal Medicine

## 2019-10-28 NOTE — Progress Notes (Signed)
Cardiology Office Note:   Date:  10/29/2019  NAME:  Laurie Mccoy    MRN: [id number] DOB:  10/31/1940   PCP:  [name], NP  Cardiologist:  No primary care provider on file.   Referring MD: [name], NP   Chief Complaint  Patient presents with  . Follow-up   History of Present Illness:   Laurie Mccoy is a 79 y.o. female with a hx of hypertension, tobacco abuse, nonobstructive CAD who presents for follow-up.  She was evaluated in October with chest pain episodes.  Coronary CTA demonstrated mild nonobstructive CAD.  She is maintained on aspirin statin.  She was found to have lung nodules concerning for cancer.  Pulmonology has repeated lung scans and are following her for this.  She reports she is doing somewhat well since her cardiac CT.  She has had a repeat CT lung study and is waiting to follow-up with her pulmonologist the results of this.  She reports no further episodes of chest pain or pressure.  She also reports fatigue.  She reports most days she does not want to get out of bed.  She reports that is difficult for her to do things around town.  She reports that her COPD medications have recently been changed due to insurance issues.  She also reports she is frustrated as she has not heard about any follow-up of her thyroid nodules.  She denies any lower extremity edema.  She reports no chest pain, shortness of breath, palpitations today.  She does have episodic episodes of skipped heartbeats.  Her pulse is regular on my examination today.  She reports that she thinks her thyroid is low and would like to have it checked today.  I do agree this is important given her symptoms.  I reviewed her recent lipid profile which shows a very normal LDL and HDL cholesterol.  Blood pressure is slightly elevated today but she reports is improved from prior.  She reports she has not started her Crestor 10 mg daily.  She reports she is worried about starting a statin agent.  I have  instructed that she should attempt to try this and we can tailor the dose and/or days of therapy.  She has very well controlled cholesterol but I would still prefer her to be on a statin due to the nonobstructive CAD.  Problem List 1. 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 Tobacco abuse 3. Hypertension   Past Medical History: Past Medical History:  Diagnosis Date  . Asthma   . Hypertension     Past Surgical History: Past Surgical History:  Procedure Laterality Date  . APPENDECTOMY    . CHOLECYSTECTOMY    . COLON RESECTION    . hernia      Current Medications: Current Meds  Medication Sig  . albuterol (PROVENTIL) (2.5 MG/3ML) 0.083% nebulizer solution Take 3 mLs (2.5 mg total) by nebulization every 4 (four) hours as needed for wheezing or shortness of breath.  [name] albuterol (VENTOLIN HFA) 108 (90 Base) MCG/ACT inhaler Inhale 2 puffs into the lungs every 6 (six) hours as needed for wheezing or shortness of breath.  . ALPRAZolam (XANAX) 0.25 MG tablet Take 0.25 mg by mouth 2 (two) times daily as needed for anxiety.  . cefdinir (OMNICEF) 300 MG capsule Take 1 capsule (300 mg total) by mouth 2 (two) times daily.  . Fluticasone-Umeclidin-Vilant (TRELEGY ELLIPTA) 100-62.5-25 MCG/INH AEPB Inhale 1 puff into the lungs daily.  [name]  loratadine (CLARITIN) 10 MG tablet Take 10 mg by mouth daily.  [name] telmisartan (MICARDIS) 40 MG tablet TAKE ONE TABLET BY MOUTH EVERY DAY  . [DISCONTINUED] predniSONE (DELTASONE) 10 MG tablet Take  4 each am x 2 days,   2 each am x 2 days,  1 each am x 2 days and stop  . [DISCONTINUED] rosuvastatin (CRESTOR) 10 MG tablet Take 1 tablet (10 mg total) by mouth daily.   Current Facility-Administered Medications for the 10/29/19 encounter (Office Visit) with Geralynn Rile, MD  Medication  . aspirin chewable tablet 81 mg     Allergies:    Trelegy ellipta [fluticasone-umeclidin-vilant] and Compazine [prochlorperazine edisylate]    Social History: Social History   Socioeconomic History  . Marital status: Married    Spouse name: Not on file  . Number of children: Not on file  . Years of education: Not on file  . Highest education level: Not on file  Occupational History  . Not on file  Tobacco Use  . Smoking status: Current Every Day Smoker    Packs/day: 1.00    Years: 46.00    Pack years: 46.00  . Smokeless tobacco: Never Used  Substance and Sexual Activity  . Alcohol use: Not on file  . Drug use: Not on file  . Sexual activity: Not on file  Other Topics Concern  . Not on file  Social History Narrative  . Not on file   Social Determinants of Health   Financial Resource Strain:   . Difficulty of Paying Living Expenses: Not on file  Food Insecurity:   . Worried About Charity fundraiser in the Last Year: Not on file  . Ran Out of Food in the Last Year: Not on file  Transportation Needs:   . Lack of Transportation (Medical): Not on file  . Lack of Transportation (Non-Medical): Not on file  Physical Activity:   . Days of Exercise per Week: Not on file  . Minutes of Exercise per Session: Not on file  Stress:   . Feeling of Stress : Not on file  Social Connections:   . Frequency of Communication with Friends and Family: Not on file  . Frequency of Social Gatherings with Friends and Family: Not on file  . Attends Religious Services: Not on file  . Active Member of Clubs or Organizations: Not on file  . Attends Archivist Meetings: Not on file  . Marital Status: Not on file     Family History: The patient's family history includes Brain cancer in her mother; Lung cancer in her father and mother; Stroke in her brother.  ROS:   All other ROS reviewed and negative. Pertinent positives noted in the HPI.     EKGs/Labs/Other Studies Reviewed:   The following studies were personally reviewed by me today:  CCTA 09/23/2019 1. Coronary calcium score of 662. This was 91 percentile for age  and sex matched control.  2. Normal coronary origin with right dominance.  3. Mild non-obstructive (25-49%) CAD in the LAD, LCX, RCA.  4. PFO noted.  RECOMMENDATIONS: 1. Mild non-obstructive CAD (25-49%). Consider non-atherosclerotic causes of chest pain. Consider preventive therapy and risk factor modification.  Recent Labs: 09/17/2019: BUN 9; Creatinine, Ser 0.64; Potassium 4.9; Sodium 139   Recent Lipid Panel No results found for: CHOL, TRIG, HDL, CHOLHDL, VLDL, LDLCALC, LDLDIRECT  Physical Exam:   VS:  BP (!) 146/70   Pulse 81   Ht 5\' 4"  (1.626 m)  Wt 203 lb (92.1 kg)   SpO2 97%   BMI 34.84 kg/m    Wt Readings from Last 3 Encounters:  10/29/19 203 lb (92.1 kg)  07/30/19 197 lb (89.4 kg)  07/28/19 198 lb (89.8 kg)    General: Well nourished, well developed, in no acute distress Heart: Atraumatic, normal size  Eyes: PEERLA, EOMI  Neck: Supple, no JVD Endocrine: No thryomegaly Cardiac: Normal S1, S2; RRR; no murmurs, rubs, or gallops Lungs: Clear to auscultation bilaterally, no wheezing, rhonchi or rales  Abd: Soft, nontender, no hepatomegaly  Ext: No edema, pulses 2+ Musculoskeletal: No deformities, BUE and BLE strength normal and equal Skin: Warm and dry, no rashes   Neuro: Alert and oriented to person, place, time, and situation, CNII-XII grossly intact, no focal deficits  Psych: Normal mood and affect   ASSESSMENT:   Laurie Mccoy is a 79 y.o. female who presents for the following: 1. Coronary artery disease involving native coronary artery of native heart without angina pectoris   2. Essential hypertension   3. Fatigue, unspecified type   4. SOB (shortness of breath)   5. Tobacco abuse     PLAN:   1. Coronary artery disease involving native coronary artery of native heart without angina pectoris -Recent cardiac CTA with nonobstructive CAD.  We'll continue aspirin/statin.  I have reassured her that her 10 mg dose of Crestor should be a good dose  for her moving forward.  She will start this today.  2. Essential hypertension -A bit better than last visit.  She will continue current medications  3. Fatigue, unspecified type 4. SOB (shortness of breath) -She reports continued fatigue and shortness of breath.  She does have COPD and continues to smoke.  I encouraged her to quit.  She will work on this. -She had normal thyroid studies a year ago.  We will repeat them today.  Her most recent CT scan does show concerns for thyroid nodule.  I will reach out to her primary care physician to further evaluate this.  We will go ahead and get the ball rolling with thyroid studies. -Overall I suspect this is an element of COPD and deconditioning.  She has no evidence of volume overload and her cardiac exam is benign.  She only has nonobstructive CAD and this does not explain her symptoms.   Disposition: Return in about 1 year (around 10/28/2020).  Medication Adjustments/Labs and Tests Ordered: Current medicines are reviewed at length with the patient today.  Concerns regarding medicines are outlined above.  Orders Placed This Encounter  Procedures  . TSH  . T4, free  . Ambulatory referral to Sleep Studies   No orders of the defined types were placed in this encounter.   Patient Instructions  Medication Instructions:  The current medical regimen is effective;  continue present plan and medications.  *If you need a refill on your cardiac medications before your next appointment, please call your pharmacy*  Lab Work: TSH, Free T4 If you have labs (blood work) drawn today and your tests are completely normal, you will receive your results only by: [name] MyChart Message (if you have MyChart) OR . A paper copy in the mail If you have any lab test that is abnormal or we need to change your treatment, we will call you to review the results.  Follow-Up: At [facility], you and your health needs are our priority.  As part of our continuing  mission to provide you with exceptional heart care, we  have created designated Provider Care Teams.  These Care Teams include your primary Cardiologist (physician) and Advanced Practice Providers (APPs -  Physician Assistants and Nurse Practitioners) who all work together to provide you with the care you need, when you need it.  Your next appointment:   12 month(s)  The format for your next appointment:   In Person  Provider:   Eleonore Chiquito, MD    Please make appointment with next available for Dr.Kelly to discuss sleep as new patient.        Signed, Addison Naegeli. Audie Box, [place]  [address] [place], [place] [address] [phone]  10/29/2019 12:31 PM

## 2019-10-29 ENCOUNTER — Other Ambulatory Visit: Payer: Self-pay

## 2019-10-29 ENCOUNTER — Ambulatory Visit (INDEPENDENT_AMBULATORY_CARE_PROVIDER_SITE_OTHER): Payer: Medicare Other | Admitting: Cardiovascular Disease

## 2019-10-29 ENCOUNTER — Encounter: Payer: Self-pay | Admitting: Cardiovascular Disease

## 2019-10-29 VITALS — BP 146/70 | HR 81 | Ht 64.0 in | Wt 203.0 lb

## 2019-10-29 DIAGNOSIS — Z720 Tobacco use: Secondary | ICD-10-CM

## 2019-10-29 DIAGNOSIS — I2510 Atherosclerotic heart disease of native coronary artery without angina pectoris: Secondary | ICD-10-CM

## 2019-10-29 DIAGNOSIS — I10 Essential (primary) hypertension: Secondary | ICD-10-CM

## 2019-10-29 DIAGNOSIS — R0602 Shortness of breath: Secondary | ICD-10-CM | POA: Diagnosis not present

## 2019-10-29 DIAGNOSIS — R5383 Other fatigue: Secondary | ICD-10-CM

## 2019-10-29 NOTE — Patient Instructions (Addendum)
Medication Instructions:  The current medical regimen is effective;  continue present plan and medications.  *If you need a refill on your cardiac medications before your next appointment, please call your pharmacy*  Lab Work: TSH, Free T4 If you have labs (blood work) drawn today and your tests are completely normal, you will receive your results only by: [name] MyChart Message (if you have MyChart) OR . A paper copy in the mail If you have any lab test that is abnormal or we need to change your treatment, we will call you to review the results.  Follow-Up: At [facility], you and your health needs are our priority.  As part of our continuing mission to provide you with exceptional heart care, we have created designated Provider Care Teams.  These Care Teams include your primary Cardiologist (physician) and Advanced Practice Providers (APPs -  Physician Assistants and Nurse Practitioners) who all work together to provide you with the care you need, when you need it.  Your next appointment:   12 month(s)  The format for your next appointment:   In Person  Provider:   Eleonore Chiquito, MD    Please make appointment with next available for Dr.Kelly to discuss sleep as new patient.

## 2019-10-30 LAB — TSH: TSH: 1.64 u[IU]/mL (ref 0.450–4.500)

## 2019-10-30 LAB — T4, FREE: Free T4: 1.37 ng/dL (ref 0.82–1.77)

## 2019-11-01 ENCOUNTER — Telehealth: Payer: Self-pay | Admitting: Pulmonary Disease

## 2019-11-01 ENCOUNTER — Encounter: Payer: Self-pay | Admitting: Pulmonary Disease

## 2019-11-01 ENCOUNTER — Ambulatory Visit (INDEPENDENT_AMBULATORY_CARE_PROVIDER_SITE_OTHER): Payer: Medicare Other | Admitting: Pulmonary Disease

## 2019-11-01 DIAGNOSIS — J449 Chronic obstructive pulmonary disease, unspecified: Secondary | ICD-10-CM | POA: Diagnosis not present

## 2019-11-01 DIAGNOSIS — Z79899 Other long term (current) drug therapy: Secondary | ICD-10-CM | POA: Diagnosis not present

## 2019-11-01 DIAGNOSIS — F17210 Nicotine dependence, cigarettes, uncomplicated: Secondary | ICD-10-CM | POA: Diagnosis not present

## 2019-11-01 DIAGNOSIS — E041 Nontoxic single thyroid nodule: Secondary | ICD-10-CM

## 2019-11-01 DIAGNOSIS — R918 Other nonspecific abnormal finding of lung field: Secondary | ICD-10-CM

## 2019-11-01 MED ORDER — BREZTRI AEROSPHERE 160-9-4.8 MCG/ACT IN AERO: 2.0000 | INHALATION_SPRAY | Freq: Two times a day (BID) | RESPIRATORY_TRACT | 0 refills | Status: DC

## 2019-11-01 NOTE — Telephone Encounter (Signed)
Happy to speak with the patient.  Please schedule her as a televisit/virtual visit and we can review what question she has for her CT chest super D.  I can try my best to help answer these questions.  That would be the best way for there to be carved out time for me to be able to speak with the patient directly.  Wyn Quaker, FNP

## 2019-11-01 NOTE — Patient Instructions (Addendum)
You were seen today by Lauraine Rinne, NP  for:   1. Thyroid nodule  Talk to primary care regarding her thyroid nodule, you need a thyroid ultrasound  2. COPD GOLD 3   Continue Breztri >>> 2 puffs in the morning right when you wake up, rinse out your mouth after use, 12 hours later 2 puffs, rinse after use >>> Take this daily, no matter what >>> This is not a rescue inhaler   Note your daily symptoms > remember "red flags" for COPD:   >>>Increase in cough >>>increase in sputum production >>>increase in shortness of breath or activity  intolerance.   If you notice these symptoms, please call the office to be seen.    3. Cigarette smoker 4. Medication management  Please present to our office in 1-2 weeks for an appointment with the clinical pharmacy team for:  . Smoking cessation . Medication Management - inhaler cost . Medication reconciliation  . Medication Access  . Inhaler teaching - HFA? Breztri  We recommend that you stop smoking.  >>>You need to set a quit date >>>If you have friends or family who smoke, let them know you are trying to quit and not to smoke around you or in your living environment  Smoking Cessation Resources:  1 800 QUIT NOW  >>> Patient to call this resource and utilize it to help support her quit smoking >>> Keep up your hard work with stopping smoking  You can also contact the [facility] >>>For smoking cessation classes call [phone]  We do not recommend using e-cigarettes as a form of stopping smoking  You can sign up for smoking cessation support texts and information:  >>>https://smokefree.gov/smokefreetxt   5. Abnormal findings on diagnostic imaging of lung  - CT Chest Wo Contrast; Future   We recommend today:  Orders Placed This Encounter  Procedures  . CT Chest Wo Contrast    Standing Status:   Future    Standing Expiration Date:   12/29/2020    Scheduling Instructions:     Complete by 02/09/20    Order  Specific Question:   Preferred imaging location?    Answer:   [place]    Order Specific Question:   Radiology Contrast Protocol - do NOT remove file path    Answer:   [email or web address]   Orders Placed This Encounter  Procedures  . CT Chest Wo Contrast   Meds ordered this encounter  Medications  . Budeson-Glycopyrrol-Formoterol (BREZTRI AEROSPHERE) 160-9-4.8 MCG/ACT AERO    Sig: Inhale 2 puffs into the lungs 2 (two) times daily.    Dispense:  10.7 g    Refill:  0    Order Specific Question:   Lot Number?    Answer:   [id number]    Order Specific Question:   Expiration Date?    Answer:   04/06/2021    Order Specific Question:   Quantity    Answer:   2    Follow Up:    Return in about 4 months (around 02/29/2020) for Follow up for PFT, After Chest CT, Follow up with Wyn Quaker FNP-C.   Please do your part to reduce the spread of COVID-19:      Reduce your risk of any infection  and COVID19 by using the similar precautions used for avoiding the common cold or flu:  [name] Wash your hands often with soap and warm water for at least 20 seconds.  If soap and water are  not readily available, use an alcohol-based hand sanitizer with at least 60% alcohol.  . If coughing or sneezing, cover your mouth and nose by coughing or sneezing into the elbow areas of your shirt or coat, into a tissue or into your sleeve (not your hands). Langley Gauss A MASK when in public  . Avoid shaking hands with others and consider head nods or verbal greetings only. . Avoid touching your eyes, nose, or mouth with unwashed hands.  . Avoid close contact with people who are sick. . Avoid places or events with large numbers of people in one location, like concerts or sporting events. . If you have some symptoms but not all symptoms, continue to monitor at home and seek medical attention if your symptoms worsen. . If you are having a medical emergency, call 911.   [place] / e-Visit: [email or web address]         [facility]: [facility]: [phone]                   [facility]: [phone]     It is flu season:   >>> Best ways to protect herself from the flu: Receive the yearly flu vaccine, practice good hand hygiene washing with soap and also using hand sanitizer when available, eat a nutritious meals, get adequate rest, hydrate appropriately   Please contact the office if your symptoms worsen or you have concerns that you are not improving.   Thank you for choosing [facility] for your healthcare, and for allowing [place] to partner with you on your healthcare journey. I am thankful to be able to provide care to you today.   Wyn Quaker FNP-C

## 2019-11-01 NOTE — Telephone Encounter (Signed)
Laurie Mccoy,  I called the patient back and she did not want to provide reason for requested call back. Had to advise her that the provider would need a heads up as to the reason for the call, especially if there was information that needed to be reviewed first.  The patient stated she wanted to talk to you about her Chest CT - super D done 10/06/19 (and a couple other things (which of course she would not discuss).  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 Her PFT and appointment with you same day on 11/11/19 was cancelled. She stated she will be getting her covid vaccine close to that time.

## 2019-11-01 NOTE — Assessment & Plan Note (Signed)
Patient like Trelegy Ellipta, but reports she had leg swelling from it Patient then went back on Symbicort 160 and then reported that she developed some arm bruising Patient then was placed on Incruse Ellipta but patient found Incruse Ellipta did not maintain her breathing long enough Patient then resume Symbicort 160 which she had leftover, patient reports she is been on this for 3 weeks now and has not developed any bruising  Patient reporting that Symbicort 160 is a tier 5 on her insurance right now which is over $160 for 1 month prescription  Patient is currently been off of all inhalers for 3 days  Plan: Trial of Breztri  Samples provided today 1 to 2-week follow-up with clinical pharmacy team

## 2019-11-01 NOTE — Addendum Note (Signed)
Addended by: Tery Sanfilippo R on: 11/01/2019 01:28 PM   Modules accepted: Orders

## 2019-11-01 NOTE — Assessment & Plan Note (Signed)
Plan: Emphasized need to stop smoking Patient to complete clinical pharmacy team follow-up for smoking cessation as well as medication management

## 2019-11-01 NOTE — Assessment & Plan Note (Signed)
Plan: Trial of Laurie Mccoy  We will coordinate appointment with clinical pharmacy team Emphasized need to stop smoking We will order CT chest to be completed in May/2021 Follow-up with Wyn Quaker, FNP in 6 to 8 weeks

## 2019-11-01 NOTE — Assessment & Plan Note (Addendum)
10/29/2019-TSH and T4 stable per chart review  Plan: Recommended patient follow back up with primary care and ensure they get thyroid ultrasound

## 2019-11-01 NOTE — Progress Notes (Signed)
Virtual Visit via Telephone Note  I connected with [name] on 11/01/19 at  2:00 PM EST by telephone and verified that I am speaking with the correct person using two identifiers.  Location: Patient: Home Provider: [facility] - [address], [place], [place], [place] [address]   I discussed the limitations, risks, security and privacy concerns of performing an evaluation and management service by telephone and the availability of in person appointments. I also discussed with the patient that there may be a patient responsible charge related to this service. The patient expressed understanding and agreed to proceed.  Patient consented to consult via telephone: Yes People present and their role in pt care: Pt    History of Present Illness:  79 year old female current every day smoker followed in our office for COPD  PMH: allergic rhinitis, remote episodes of depression Smoker/ Smoking History: Current every day smoker.  46-pack-year smoking history.  Smoking half pack per day. Maintenance: Symbicort 160 Pt of: Dr. Melvyn Novas  Chief complaint: Follow up on CT imaging   79 year old female current everyday smoker followed in our office for COPD.  Patient recently completed a super D CT of her chest in December/2020 those results are listed below:  10/06/2019-CT super D chest-mild centrilobular and paraseptal emphysema, scattered groundglass nodules measuring up to 11 mm in posterior segment of right upper lobe, calcified granuloma in the right upper lobe, aortic arthrosclerosis, dominant left thyroid nodule, recommend thyroid ultrasound  Patient wanted to follow-up with our office regarding these results.  She also reports that she has received her first Covid shot.  She reports she was fairly symptomatic after the first Covid shot.  She was scheduled for second Covid shot and so she canceled her pulmonary function test which been a few days after and other concerned  that she may have worsened symptoms and not be able to complete the test.  Patient has not been rescheduled for pulmonary function test yet.  Patient continues to smoke.  She is working on stopping.  She is tapering down.  She is interested in smoking cessation.  She is a history of previously using Chantix but this caused psychiatric changes.  Patient also has concerns regarding her inhalers.  She was previously managed on Trelegy Ellipta which she liked.  Unfortunately she felt that she developed leg swelling because of this.  She was transitioned to Symbicort 160 which then she developed arm swelling/bruising from that.  She was then switched to [employer] which she did not feel manage her breathing well.  She is interested in trying other inhalers.  She is resume Symbicort 160 and she has been on this for 3 weeks and she has not developed any arm bruising or swelling.  Unfortunately she is ran out of inhalers and she is not currently on any inhalers for the last 3 days.  She reports that Symbicort 160 is a tier 5 with her insurance this year.  Observations/Objective:  03/10/2019-SARS-CoV-2-negative  03/10/2019-alpha-1-129, antitrypsin phenotype is PI*MM  Spirometry 12/28/2018  FEV1 0.82 (37%)  Ratio 0.56 with classic curvature  (Pt says this was over 1 year ago)   10/06/2019-CT super D chest-mild centrilobular and paraseptal emphysema, scattered groundglass nodules measuring up to 11 mm in posterior segment of right upper lobe, calcified granuloma in the right upper lobe, aortic arthrosclerosis, dominant left thyroid nodule, recommend thyroid ultrasound  Social History   Tobacco Use  Smoking Status Current Every Day Smoker  . Packs/day: 1.00  .  Years: 46.00  . Pack years: 46.00  Smokeless Tobacco Never Used   Immunization History  Administered Date(s) Administered  . Influenza,inj,quad, With Preservative 09/17/2017  . Influenza-Unspecified 07/07/2018  . Pneumococcal  Polysaccharide-23 05/10/2019      Assessment and Plan:  COPD GOLD 3  Plan: Trial of Breztri  We will coordinate appointment with clinical pharmacy team Emphasized need to stop smoking We will order CT chest to be completed in May/2021 Follow-up with Wyn Quaker, FNP in 6 to 8 weeks  Thyroid nodule 10/29/2019-TSH and T4 stable per chart review  Plan: Recommended patient follow back up with primary care and ensure they get thyroid ultrasound  Cigarette smoker Plan: Emphasized need to stop smoking Patient to complete clinical pharmacy team follow-up for smoking cessation as well as medication management  Medication management Patient like Trelegy Ellipta, but reports she had leg swelling from it Patient then went back on Symbicort 160 and then reported that she developed some arm bruising Patient then was placed on Incruse Ellipta but patient found Incruse Ellipta did not maintain her breathing long enough Patient then resume Symbicort 160 which she had leftover, patient reports she is been on this for 3 weeks now and has not developed any bruising  Patient reporting that Symbicort 160 is a tier 5 on her insurance right now which is over $160 for 1 month prescription  Patient is currently been off of all inhalers for 3 days  Plan: Trial of Breztri  Samples provided today 1 to 2-week follow-up with clinical pharmacy team   Follow Up Instructions:  Return in about 4 months (around 02/29/2020) for Follow up for PFT, After Chest CT, Follow up with Wyn Quaker FNP-C.   I discussed the assessment and treatment plan with the patient. The patient was provided an opportunity to ask questions and all were answered. The patient agreed with the plan and demonstrated an understanding of the instructions.   The patient was advised to call back or seek an in-person evaluation if the symptoms worsen or if the condition fails to improve as anticipated.  I provided 24 minutes of  non-face-to-face time during this encounter.   Lauraine Rinne, NP

## 2019-11-01 NOTE — Telephone Encounter (Signed)
Called patient back, scheduled for televisit today at 2:00. Nothing further needed.

## 2019-11-08 ENCOUNTER — Other Ambulatory Visit (HOSPITAL_COMMUNITY): Payer: Medicare Other

## 2019-11-10 NOTE — Progress Notes (Signed)
Subjective:  Patient presents today to [facility] to see pharmacy team for inhaler education and smoking cessation per referral of Laurie Quaker, NP, on 11/01/2019.  Past medical history includes HTN, COPD, thyroid nodule (10/06/2019), multiple pulmonary nodules (10/06/2019), and recrurrent ventral hernia. Patient reporting that "Symbicort 160 is a tier 5 on her insurance right now which is over $160 for 1 month prescription".   Based on test claims Frederik Schmidt, CPT, found the following information: 1) Dulera - non form  2) Advair HFA - $47.00  3) Symbicort - non-form- $199.81- [place]  4) Breo Ellipta - $47.00  5) Stiolto Respimat - non- form  6) Breztri - non- form  7) Trelegy - $47.00  8) Chantix - $244.94  9) Bupropion - $20.00  10) Anoro - $47 Claims does not show deductible being applied.   Patient states she feels that Breztri, Trelegy Ellipta, and Symbicort were all effective at managing her COPD. She states Symbicort is too expensive to use. She found Trelegy Ellipta affordable, but thinks it may have caused leg swelling. Had thorough discussion with patient that it is highly unlikely that Trelegy Ellipta caused leg swelling based on pharmacokinetics of medication. Patient states she would be willing to re-trial Trelegy Ellipta in the future due to its efficacy and would be willing to monitor amount of salt intake / amount of water intake, and mobility to determine if those could be underlying causes of edema rather than Trelegy Ellipta inhaler. She states she finds $47 copay affordable for inhaler, but it is not affordable to pay more than that for inhalers.  She reports she smokes ~10 cigarettes day currently. She has used Chantix and bupropion in the past; both agents made her "crazy". She knows she needs to quit due to her health. After last appt with Laurie Mccoy (11/01/2019) she purchased the nicotine patch 21 mg daily and has not started officially using it.  Respiratory  Medications Current: Breztri, albuertol prn, albuterol nebs Tried in past: Trelegy Ellipta ("leg swelling"), Symbicort ("arm bruising", cost), Incruse Ellipta (ineffective) Patient reports no known adherence challenges  Respiratory Symptomatology  Triggers: "sometimes no reason", exertion, CPAP  SOB: 1-2x per day Chest pain: denies Coughing at night: 0 days/week Coughing with exertion: denies  Frequency of rescue inhaler use: 1-2x per day  ER visits in last year: 1  Hospitalizations in last year: 0  Social History   Tobacco Use  Smoking Status Current Every Day Smoker  . Packs/day: 1.00  . Years: 46.00  . Pack years: 46.00  Smokeless Tobacco Never Used    Objective: Allergies  Allergen Reactions  . Trelegy Ellipta [Fluticasone-Umeclidin-Vilant] Swelling    Swelling in legs and headache.  . Compazine [Prochlorperazine Edisylate]     "I look like I have had a stroke"    Outpatient Encounter Medications as of 11/15/2019  Medication Sig  . albuterol (PROVENTIL) (2.5 MG/3ML) 0.083% nebulizer solution Take 3 mLs (2.5 mg total) by nebulization every 4 (four) hours as needed for wheezing or shortness of breath.  [name] albuterol (VENTOLIN HFA) 108 (90 Base) MCG/ACT inhaler Inhale 2 puffs into the lungs every 6 (six) hours as needed for wheezing or shortness of breath.  . ALPRAZolam (XANAX) 0.25 MG tablet Take 0.25 mg by mouth 2 (two) times daily as needed for anxiety.  . Budeson-Glycopyrrol-Formoterol (BREZTRI AEROSPHERE) 160-9-4.8 MCG/ACT AERO Inhale 2 puffs into the lungs 2 (two) times daily.  . cefdinir (OMNICEF) 300 MG capsule Take 1 capsule (300  mg total) by mouth 2 (two) times daily.  . Fluticasone-Umeclidin-Vilant (TRELEGY ELLIPTA) 100-62.5-25 MCG/INH AEPB Inhale 1 puff into the lungs daily.  [name] loratadine (CLARITIN) 10 MG tablet Take 10 mg by mouth daily.  [name] telmisartan (MICARDIS) 40 MG tablet TAKE ONE TABLET BY MOUTH EVERY DAY   Facility-Administered Encounter Medications as  of 11/15/2019  Medication  . aspirin chewable tablet 81 mg     Immunization History  Administered Date(s) Administered  . Influenza,inj,quad, With Preservative 09/17/2017  . Influenza-Unspecified 07/07/2018  . Pneumococcal Polysaccharide-23 05/10/2019     PFTs (12/28/2018) FEV1 0.82 (37%) Ratio 0.56 with classic curvature  **Patient has follow up PFTs scheduled for 12/27/2019.  Chest X-ray (10/06/2019) CT super D chest-mild centrilobular and paraseptal emphysema, scattered groundglass nodules measuring up to 11 mm in posterior segment of right upper lobe, calcified granuloma in the right upper lobe, aortic arthrosclerosis, dominant left thyroid nodule, recommend thyroid ultrasound  Eosinophils (07/26/2019) Most recent blood eosinophil count was 0.1 cells/microL  PIFR (11/15/2019) -Inspiratory flow measured using the In-check DIAL G16 and was in range of 30-90 for use of DPI (medium low, Ellipta) device. Patient scored 90. -Inspiratory flow measured using the In-check DIAL G16 and was in range of 20-60 for use of pMDI (Respimat) device. Patient scored 50.  Assessment and Plan  1. Inhaler Optimization a. PIFR i. Inspiratory flow measured using the In-check DIAL G16 and was in range of 30-90 for use of DPI (medium low, Ellipta) device. Patient scored 90. ii. Inspiratory flow measured using the In-check DIAL G16 and was in range of 20-60 for use of pMDI (Respimat) device. Patient scored 50. b. Optimal inhaler for patient would be Anoro Ellipta considering PIFR score for DPI, eosinophils are < 300 cells/microL, and zero hospitalizations due to COPD in the past year. i. Patient was counseled on the purpose, proper use, and adverse effects of Anoro Ellipta inhaler. Patient verbalized understanding. ii. Reviewed appropriate use of maintenance vs rescue inhalers.  Stressed importance of using maintenance inhaler daily and rescue inhaler only as needed.  Patient verbalized  understanding. iii. Demonstrated proper inhaler technique using Ellipta demo inhaler.  Patient able to demonstrate proper inhaler technique using teach back method. Patient was given 2 samples in office today for 14-day supply of Anoro Ellipta (lot number: [id number]; expiration date: 03/06/2021). c. Follow Up: 1 week via telephone (11/22/2019); after discussion with Laurie Quaker, NP, will make a follow up appt with Laurie Mccoy if breathing worsens on Anoro Ellipta.   2. Smoking Cessation  a. Provided information on 1 800-QUIT NOW support program.  b. Non-Pharmacologic i. Plan: red hot candies, gum, mints, cinnamon toothpicks c. Pharmacologic i. Initiated nicotine replacement tx with nicotine patch 21 mg daily and nicotine gum/lozenge. Due to time constraints unable to address how quickly she smokes after waking (will ask at follow up). [place] [facility] will ask prior to sending her nicotine gum/lozenge to ensure she receives appropriate dose. Patient instructed to call [place] [facility] to obtain smoking cessation medications; patient verbalized understanding.  Patient counseled on purpose, proper use, and potential adverse effects.  1. Nicotine Patch a. Patient counseled on purpose, proper use, and potential adverse effects, including mild itching or redness at the point of application, headache, trouble sleeping, and/or vivid dreams    b. Patch Schedule for >10 cigarettes daily i. Weeks 1-6: one 21 mg patch daily ii. Weeks 7-8: one 14 mg patch daily iii. Weeks 9-10: one 7 mg patch daily 2. Nicotine Lozenge a. Patient counseled  on purpose, proper use, and potential adverse effects including nausea, hiccups, cough, and heartburn.  Instructed patient to use  2 mg unless the smoke within 30 minutes of waking up in which they should use 4 mg. b. Lozenge dosing schedule i. Weeks 1 to 6: 1 lozenge every 1 to 2 hours (maximum: 5 lozenges every 6 hours; 20 lozenges/day); to increase chances of quitting, use at least 9  lozenges/day during the first 6 weeks ii. Weeks 7 to 9: 1 lozenge every 2 to 4 hours (maximum: 5 lozenges every 6 hours; 20 lozenges/day) iii. Weeks 10 to 12: 1 lozenge every 4 to 8 hours (maximum: 5 lozenges every 6 hours; 20 lozenges/day) 3. Nicotine Gum a. Patient counseled on purpose, proper use, and potential adverse effects including jaw soreness and upset stomach if swallowing saliva.  Instructed patient to use 4 mg if they smoke a pack a day or more and 2 mg if they smoke less than a pack a day. b. Gum dosing schedule i. Weeks 1 to 6: Chew 1 piece of gum every 1 to 2 hours (maximum: 24 pieces/day); to increase chances of quitting, chew at least 9 pieces/day during the first 6 weeks ii. Weeks 7 to 9: Chew 1 piece of gum every 2 to 4 hours (maximum: 24 pieces/day) iii. Weeks 10 to 12: Chew 1 piece of gum every 4 to 8 hours (maximum: 24 pieces/day) d. Follow up: 1 week (11/22/2019)  3. Medication Reconciliation a. A drug regimen assessment was performed, including review of allergies, interactions, disease-state management, dosing and immunization history. Medications were reviewed with the patient, including name, instructions, indication, goals of therapy, potential side effects, importance of adherence, and safe use. b. Drug interaction(s): none   4. Immunizations e. Patient is indicated for the influenzae and shingles vaccinations. Unable to discuss due to time constraints - will address at follow up appt.  Thank you for involving pharmacy to assist in providing this patient's care.   Drexel Iha, PharmD PGY2 Ambulatory Care Pharmacy Resident

## 2019-11-11 ENCOUNTER — Ambulatory Visit: Payer: Medicare Other | Admitting: Pulmonary Disease

## 2019-11-15 ENCOUNTER — Ambulatory Visit (INDEPENDENT_AMBULATORY_CARE_PROVIDER_SITE_OTHER): Payer: Medicare Other | Admitting: Pharmacist

## 2019-11-15 ENCOUNTER — Other Ambulatory Visit: Payer: Self-pay

## 2019-11-15 DIAGNOSIS — F17210 Nicotine dependence, cigarettes, uncomplicated: Secondary | ICD-10-CM

## 2019-11-15 DIAGNOSIS — Z720 Tobacco use: Secondary | ICD-10-CM

## 2019-11-15 DIAGNOSIS — J449 Chronic obstructive pulmonary disease, unspecified: Secondary | ICD-10-CM | POA: Diagnosis not present

## 2019-11-15 MED ORDER — UMECLIDINIUM-VILANTEROL 62.5-25 MCG/INH IN AEPB: 1.0000 | INHALATION_SPRAY | Freq: Every day | RESPIRATORY_TRACT | 11 refills | Status: DC

## 2019-11-15 NOTE — Patient Instructions (Signed)
It was a pleasure seeing you in clinic today Mrs. Queener!  Today the plan is... 1. Start Anoro Ellipta one puff daily. Schedule an appt with Wyn Quaker in 2 weeks to re-assess how your breathing is doing.  2. Start using nicotine patch daily and nicotine lozenge/gum  3. Call [place] [facility] 1-800-QUITNOW  Please call the PharmD clinic at [phone] if you have any questions that you would like to speak with a pharmacist about Stanton Kidney, Museum/gallery conservator).

## 2019-11-22 ENCOUNTER — Telehealth: Payer: Self-pay | Admitting: Pharmacist

## 2019-11-22 NOTE — Telephone Encounter (Signed)
Called patient on 11/22/2019 at 10:42 AM   Patient confirms she is tolerating Anoro Ellipta and is not experiencing any side effects.    She has not called [facility], however, stated she will call today. Will reach out to patient in ~1 month to assess success of smoking cessation attempt.   Thank you for involving pharmacy to assist in providing this patient's care.   Drexel Iha, PharmD PGY2 Ambulatory Care Pharmacy Resident

## 2019-11-30 ENCOUNTER — Telehealth: Payer: Self-pay | Admitting: Cardiovascular Disease

## 2019-11-30 NOTE — Telephone Encounter (Signed)
Patient states she is requesting to ask Dr. Audie Box a question in regards to a thyroid nodule. Patient did not go into further detail. Please call.

## 2019-11-30 NOTE — Telephone Encounter (Signed)
Called patient back- she states that she went to see the ENT doctor regarding her nodule- and they suggested a biopsy, but she is unsure if she should let the ENT do this, or if she go to Endocrinologist to do the biopsy. She states she would like to ask Dr.O'Neal a question about it because she trusts him and his opinion. I advised patient I could let him know of her question, but she requested that he call her. I advised patient I would send a message, and either she would hear from me, or from him.  Patient was thankful for the call.   Please advise, thank you!

## 2019-11-30 NOTE — Telephone Encounter (Signed)
Please let her know that the ENT is probably the better to do this. They are surgeons and much more capable of handling this issue.   Evalina Field, MD

## 2019-11-30 NOTE — Telephone Encounter (Signed)
Called patient, advised of message from MD. Patient verbalized understanding and was thankful for the call.

## 2019-12-01 ENCOUNTER — Telehealth: Payer: Self-pay | Admitting: Pharmacist

## 2019-12-01 DIAGNOSIS — J449 Chronic obstructive pulmonary disease, unspecified: Secondary | ICD-10-CM

## 2019-12-01 MED ORDER — TRELEGY ELLIPTA 100-62.5-25 MCG/INH IN AEPB: 1.0000 | INHALATION_SPRAY | Freq: Every day | RESPIRATORY_TRACT | 11 refills | Status: DC

## 2019-12-01 NOTE — Telephone Encounter (Signed)
Thank you for working with the patient.  Agree with retrialing Trelegy Ellipta.  Wyn Quaker, FNP

## 2019-12-01 NOTE — Telephone Encounter (Signed)
Contacted patient back on 12/01/2019 at 4:50 PM   Patient states that the [facility] lacks efficacy and requests to be switched to a stronger inhaler.     Drexel Iha, PharmD PGY2 Ambulatory Care Pharmacy Resident

## 2019-12-01 NOTE — Telephone Encounter (Signed)
Called patient on 12/01/2019 at 4:38 PM and left HIPAA-compliant VM with instructions to call [facility] back   Will re-attempt to contact patient on Friday (12/03/2019)  Drexel Iha, PharmD PGY2 Ambulatory Care Pharmacy Resident

## 2019-12-01 NOTE — Addendum Note (Signed)
Addended by: Ellwood Handler on: 12/01/2019 04:57 PM   Modules accepted: Orders

## 2019-12-01 NOTE — Telephone Encounter (Signed)
Contacted patient back on 12/01/2019 at 4:50 PM   Patient states that the [facility] lacks efficacy and requests to be switched to a stronger inhaler. Based on insurance formulary, will change and retrial patient on Trelegy Ellipta. Patient has tried Trelegy Ellipta in the past and thinks it may have caused edema. Discussed with patient again how based on pharmacology of Trelegy Ellipta it is highly unlikely Trelegy Ellipta caused swelling and there have been no case reports in the literature associating Trelegy Ellipta use with swelling. Discussed with patient how alternative option would be Breztri, however, considering this medication is non-formulary it likely will be expensive if formulary request is approved. Patient would prefer to re-trial Trelegy considering cost and confirms she is comfortable re-trialing medication. Sent Trelegy Ellipta rx to patient's preferred pharmacy. Advised patient to contact me if there are any issues obtaining prescription. Patient verbalized understanding.  Educated patient on mechanism of action, efficacy, dosing, and administration. Reiterated to patient she will need to rinse her mouth out since inhaler contains ICS. Patient verbalized understanding.  Patient will make a follow up appt soon with Wyn Quaker, FNP, to re-assess respiratory symptomatology and efficacy of Trelegy Ellipta.  Thank you for involving pharmacy to assist in providing this patient's care.   Drexel Iha, PharmD PGY2 Ambulatory Care Pharmacy Resident

## 2019-12-16 ENCOUNTER — Telehealth: Payer: Self-pay | Admitting: Pulmonary Disease

## 2019-12-16 NOTE — Telephone Encounter (Signed)
Spoke with pt, she states she has an appt on 3/22 for a pulmonary test. She has just discovered that she has a big nodule on her thyroid. It causes her swallowing issues and she coughs frequently. She thinks she should cancel the PFT test for right now. She will call to reschedule after her surgery on her thyroid. All appointments cancelled. Laurie Mccoy.

## 2019-12-16 NOTE — Telephone Encounter (Signed)
Okay noted.  Unsure why patient is canceling work-up as she is delaying the work-up with endocrinology.  See endocrinology assessment and plan listed below:  ASSESSMENT: This is a 79 y.o. female with a large (5 cm) thyroid nodule in the left lobe, recently identified incidentally on chest CT, and further characterized with thyroid ultrasound. She had FNA biopsy performed on 2/26, but the sample was nondiagnostic. She is euthyroid. She has had mild compressive symptoms, including intermittent dysphagia and hoarseness.  I examined her thyroid gland with bedside ultrasound today. The dominant nodule in the left lobe has fairly reassuring sonographic features, with predominantly spongiform texture and minimal internal Doppler activity. It would be reasonable to proceed with left hemithyroidectomy, simply based on her compressive symptoms and the size of the nodule. However, she would like to wait for a few more months. I will see her back in the office in March to measure the nodule again with ultrasound, and discuss either repeating FNA biopsy or referring back to Dr. Jenell Milliner for left hemithyroidectomy.  I would not recommend starting Synthroid for purposes of goiter suppression.  PLAN:  1) Will forego any additional testing today. 2) She will return for follow-up and repeat US in 3 months.   Thank you for allowing me to participate in the care of this patient.     Electronically signed by Adair Patter, MD at 12/15/2019 12:49 PM EST     The pulmonary recommendations from our office still remain.  I would recommend the patient complete pulmonary function test.  We can respect the patient's decision to delay these thoughts that she would like to do and she can contact her office when she is ready to proceed forward.   Wyn Quaker FNP

## 2019-12-24 ENCOUNTER — Telehealth: Payer: Self-pay | Admitting: Pharmacist

## 2019-12-24 ENCOUNTER — Other Ambulatory Visit (HOSPITAL_COMMUNITY): Payer: Medicare Other

## 2019-12-24 NOTE — Telephone Encounter (Signed)
Called patient on 12/24/2019 at 4:18 PM.  Patient confirms she has called [facility]. States she had a wonderful conversation with Loa Socks ([facility] representative). She has received patches/lozenges in the mail, however, was hesitant to initiate use since she has had biopsies taken lately of her thyroid. Explained she can use agents although she is getting biopsies taken, however, if she gets an imaging done stated she may have to take patch off. She is able to put patch on again as soon as imaging is done. Patient verbalized understanding. She would like to put patch on next Thursday (12/30/2019) and ideally would like to quit smoking by 01/06/2020. She will be watching her grandchildren over the next week, which is why she would prefer to wait until 12/30/2019 to put on patch.  Tobacco use: 8 cigarettes daily  Triggers: stress  Current smoking cessation agents: nicotine patch and lozenge (has not started using yet) Prior smoking cessation agents:  -Chantix - made her "crazy" -Bupropion - made her "crazy" Non-pharmacologic methods: N/A  Motivation to quit: health Goal: Smoke 0 cigarettes by next Thursday (12/30/2019)  Plan: Advised patient to decrease cigarette use by 1-2 cigs/day over the next week. Initiate nicotine patch and lozenge. Will follow pu on 01/07/2020.  Follow up: 01/07/2020

## 2019-12-27 ENCOUNTER — Ambulatory Visit: Payer: Medicare Other | Admitting: Pulmonary Disease

## 2019-12-27 ENCOUNTER — Telehealth: Payer: Self-pay | Admitting: Internal Medicine

## 2019-12-27 NOTE — Telephone Encounter (Signed)
Called Diane back with [employer] and was advised pt had a super D CT chest that was performed in 09/2019 but amended today 12/27/19. Addendum is below:  ADDENDUM: 2.8 cm low-attenuation left thyroid nodule. Recommend thyroid US (ref: J Am Coll Radiol. 2015 Feb;12(2): 143-50).These results will be called to the ordering clinician or representative by the Radiologist Assistant, and communication documented in the PACS or [employer].   Electronically Signed   By: Lorin Picket M.D.   On: 12/27/2019 08:10     Dr. Melvyn Novas please advise. Thanks.

## 2019-12-28 NOTE — Telephone Encounter (Signed)
12/28/2019  This was discussed in January/2021.  Patient was encouraged to complete follow-up with endocrinology.  Patient completed initial consult in March/2021 with Dr. Steffanie Dunn at [facility].  Not initial consult note is listed below:  HISTORY OF PRESENT ILLNESS: This is a 79 y.o. female who is seen in consultation at the request of Dr. 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 The patient has a history of COPD, and had a chest CT done in December 2020 in the [facility] system, for monitoring of a pulmonary nodule. This CT incidentally identified a left thyroid nodule. She subsequently had a thyroid ultrasound performed on 11/08/19 at [facility]. The right lobe of the thyroid measured 4.6 x 1.7 x 1.8 cm. The left lobe of the thyroid measured 5.5 x 2.8 x 3.2 cm. The isthmus measured 0.6 cm in AP diameter. There was a dominant 5.2 x 2.7 x 3.2 cm solid nodule replacing most of the left thyroid lobe, identified as being hyperechoic. There was also a 0.6 cm complex nodule in the right inferior pole.  Dr. Jenell Milliner performed FNA biopsy of this nodule on 12/03/2019. The FNA cytology was reportedly nondiagnostic. They had discussed left thyroid lobectomy versus continued monitoring, and she wanted another opinion on what she should do. She reports that she has been having dysphagia to some solid foods intermittently for the last year or so. She also has had worsening hoarseness. She has chronic exertional dyspnea related to COPD, and continues to smoke about 1/2 pack of cigarettes daily. She also has obstructive sleep apnea, and uses nocturnal CPAP therapy. She denies any symptoms of hyperthyroidism, including tremor, palpitations, tachycardia, heat intolerance, weight loss, fatigue, or insomnia. She recently had a normal TSH in January.  She has no history of radiation treatment or radiation exposure to the head/neck, and no family history of thyroid cancer.    Assessment  and plan: ASSESSMENT: This is a 80 y.o. female with a large (5 cm) thyroid nodule in the left lobe, recently identified incidentally on chest CT, and further characterized with thyroid ultrasound. She had FNA biopsy performed on 2/26, but the sample was nondiagnostic. She is euthyroid. She has had mild compressive symptoms, including intermittent dysphagia and hoarseness.  I examined her thyroid gland with bedside ultrasound today. The dominant nodule in the left lobe has fairly reassuring sonographic features, with predominantly spongiform texture and minimal internal Doppler activity. It would be reasonable to proceed with left hemithyroidectomy, simply based on her compressive symptoms and the size of the nodule. However, she would like to wait for a few more months. I will see her back in the office in March to measure the nodule again with ultrasound, and discuss either repeating FNA biopsy or referring back to Dr. Jenell Milliner for left hemithyroidectomy.  I would not recommend starting Synthroid for purposes of goiter suppression.  PLAN:  1) Will forego any additional testing today. 2) She will return for follow-up and repeat US in 3 months.    This is already been followed up on.  Patient can follow back up with endocrinology if she has additional concerns.  It apparently looks like the patient did have a fairly long discussion with the endocrinologist in March/2021 where she declined additional work-up at this time and will do a repeat ultrasound sometime in June/2021.  We will route back to triage for follow-up and documentation.  Wyn Quaker, FNP

## 2019-12-28 NOTE — Telephone Encounter (Signed)
Will forward to APP of day in Dr. Gustavus Bryant absence.  Tammy, please advise. Thanks.

## 2019-12-28 NOTE — Telephone Encounter (Signed)
Called and spoke with pt about the addendum that was done on CT from 12/20. Stated to her to f/u with endocrinology and to have repeat US June 2021. Pt verbalized understanding. Nothing further needed.

## 2019-12-28 NOTE — Telephone Encounter (Signed)
According to [facility] note 10/2019 this was addressed will route to him

## 2020-01-10 ENCOUNTER — Telehealth: Payer: Self-pay | Admitting: Pharmacist

## 2020-01-10 NOTE — Telephone Encounter (Signed)
Called patient on 01/10/2020 at 4:27 PM.  Patient has decreased from smoking 8 cigarettes daily to 1 cigarette daily -- congratulated patient for her efforts!!! She had planned to officially quit on 12/30/19, however, she had family over this past weekend which was stressful so she started smoking again. She feels when she gets back into a routine she will quit. She confirmed she has received nicotine patch and lozenge from [facility]. She is being followed by [facility] currently.   Tobacco use: 1 cigarettes daily  Triggers: stress  Current smoking cessation agents: nicotine patch and lozenge  Prior smoking cessation agents:  -Chantix - made her "crazy" -Bupropion - made her "crazy" Non-pharmacologic methods: N/A  Motivation to quit: health Goal: none  Plan: Continue NRT and follow with [facility]  Follow up: Patient would prefer to follow with solely [facility] for now and continue to use nicotine patch/lozenge. She states she would like to "surpise me" when she quits smoking and will contact office. Will await patient's call.

## 2020-01-10 NOTE — Telephone Encounter (Signed)
[place] noted. Thank you for working with the pt.   Wyn Quaker FNP

## 2020-01-19 ENCOUNTER — Ambulatory Visit (INDEPENDENT_AMBULATORY_CARE_PROVIDER_SITE_OTHER): Payer: Medicare Other | Admitting: Cardiovascular Disease

## 2020-01-19 ENCOUNTER — Other Ambulatory Visit: Payer: Self-pay

## 2020-01-19 ENCOUNTER — Ambulatory Visit: Payer: Medicare Other | Admitting: Cardiovascular Disease

## 2020-01-19 ENCOUNTER — Encounter: Payer: Self-pay | Admitting: Cardiovascular Disease

## 2020-01-19 VITALS — BP 120/62 | HR 75 | Ht 66.0 in | Wt 200.2 lb

## 2020-01-19 DIAGNOSIS — Z720 Tobacco use: Secondary | ICD-10-CM | POA: Diagnosis not present

## 2020-01-19 DIAGNOSIS — J432 Centrilobular emphysema: Secondary | ICD-10-CM | POA: Diagnosis not present

## 2020-01-19 DIAGNOSIS — I2510 Atherosclerotic heart disease of native coronary artery without angina pectoris: Secondary | ICD-10-CM | POA: Diagnosis not present

## 2020-01-19 DIAGNOSIS — G4733 Obstructive sleep apnea (adult) (pediatric): Secondary | ICD-10-CM

## 2020-01-19 DIAGNOSIS — I10 Essential (primary) hypertension: Secondary | ICD-10-CM

## 2020-01-19 NOTE — Patient Instructions (Signed)

## 2020-01-19 NOTE — Progress Notes (Signed)
Cardiology Office Note    Date:  01/26/2020   ID:  [name], DOB 1941/09/17, MRN [id number]  PCP:  [name], NP  Cardiologist:  Shelva Majestic, MD   Initial sleep evaluation with me.  History of Present Illness:  Laurie Mccoy is a 79 y.o. female who presents for an initial sleep evaluation with me.  She had moved to the [place] area from [place] approximately 2 years ago.  Laurie Mccoy has a long history of tobacco use having started smoking at age 23 and smoked for at least 30 years.  She has been found to have mild centrilobular and paraseptal emphysema with scattered groundglass nodules on CT imaging.  She recently established cardiology care with Dr. 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  While living in [place] she underwent a sleep study in November 2013 at [place].  This revealed severe sleep apnea with an AHI of 30/h.  Sleep apnea was more severe and REM sleep with 69/h.  CPAP therapy was recommended.  Is a Respironics dream station CPAP unit and has a so clean at home for cleansing.  She brought her machine to the office today since she was concerned to make certain it was still working properly.  Interrogation of her machine revealed her average usage at 5.1 hours and her AHI is excellent at 1.3.  Average pressure is 12.6 cm of water.  He had [employer] as her DME company but they have recently been bought out by [employer].  We will need to contact them so that her machine can be linked to [place] for continued follow-up.  Presently she believes she is sleeping well with her CPAP unit.  She is unaware of breakthrough snoring.  Epworth Sleepiness Scale score was calculated in the office today and this endorsed at 10 suggesting some borderline residual daytime sleepiness.  She is unaware of breakthrough snoring.  She denies frequent nocturia.  He denies awareness of bruxism, restless legs, and is unaware of  any hypnagogic hallucinations or cataplectic events.  She presents for evaluation.  Past Medical History:  Diagnosis Date  . Asthma   . Hypertension     Past Surgical History:  Procedure Laterality Date  . APPENDECTOMY    . CHOLECYSTECTOMY    . COLON RESECTION    . hernia      Current Medications: Outpatient Medications Prior to Visit  Medication Sig Dispense Refill  . albuterol (PROVENTIL) (2.5 MG/3ML) 0.083% nebulizer solution Take 3 mLs (2.5 mg total) by nebulization every 4 (four) hours as needed for wheezing or shortness of breath.    [name] albuterol (VENTOLIN HFA) 108 (90 Base) MCG/ACT inhaler Inhale 2 puffs into the lungs every 6 (six) hours as needed for wheezing or shortness of breath. 18 g 1  . ALPRAZolam (XANAX) 0.25 MG tablet Take 0.25 mg by mouth 2 (two) times daily as needed for anxiety.    [name] aspirin EC 81 MG tablet Take 81 mg by mouth daily.    . Fluticasone-Umeclidin-Vilant (TRELEGY ELLIPTA) 100-62.5-25 MCG/INH AEPB Inhale 1 puff into the lungs daily. Please remember to rinse your mouth! 1 each 11  . loratadine (CLARITIN) 10 MG tablet Take 10 mg by mouth daily.    . nicotine (NICODERM CQ - DOSED IN MG/24 HOURS) 21 mg/24hr patch Place 21 mg onto the skin daily.    . nicotine polacrilex (NICOTINE MINI) 4 MG lozenge Take 4 mg by mouth as  needed for smoking cessation.    [name] telmisartan (MICARDIS) 40 MG tablet TAKE ONE TABLET BY MOUTH EVERY DAY 30 tablet 2  . rosuvastatin (CRESTOR) 10 MG tablet Take 10 mg by mouth daily.    [name] umeclidinium-vilanterol (ANORO ELLIPTA) 62.5-25 MCG/INH AEPB Inhale 1 puff into the lungs daily. 60 each 11   No facility-administered medications prior to visit.     Allergies:   Compazine [prochlorperazine edisylate] and Trelegy ellipta [fluticasone-umeclidin-vilant]   Social History   Socioeconomic History  . Marital status: Married    Spouse name: Not on file  . Number of children: Not on file  . Years of education: Not on file  . Highest  education level: Not on file  Occupational History  . Not on file  Tobacco Use  . Smoking status: Current Every Day Smoker    Packs/day: 0.50    Years: 46.00    Pack years: 23.00  . Smokeless tobacco: Never Used  Substance and Sexual Activity  . Alcohol use: Not on file  . Drug use: Not on file  . Sexual activity: Not on file  Other Topics Concern  . Not on file  Social History Narrative  . Not on file   Social Determinants of Health   Financial Resource Strain:   . Difficulty of Paying Living Expenses:   Food Insecurity:   . Worried About Charity fundraiser in the Last Year:   . Arboriculturist in the Last Year:   Transportation Needs:   . Film/video editor (Medical):   [name] Lack of Transportation (Non-Medical):   Physical Activity:   . Days of Exercise per Week:   . Minutes of Exercise per Session:   Stress:   . Feeling of Stress :   Social Connections:   . Frequency of Communication with Friends and Family:   . Frequency of Social Gatherings with Friends and Family:   . Attends Religious Services:   . Active Member of Clubs or Organizations:   . Attends Archivist Meetings:   [name] Marital Status:      Family History:  The patient's family history includes Brain cancer in her mother; Lung cancer in her father and mother; Stroke in her brother.   ROS General: Negative; No fevers, chills, or night sweats;  HEENT: Negative; No changes in vision or hearing, sinus congestion, difficulty swallowing Pulmonary: COPD, emphysema Cardiovascular: Positive for hypertension and hyperlipidemia GI: Negative; No nausea, vomiting, diarrhea, or abdominal pain GU: Negative; No dysuria, hematuria, or difficulty voiding Musculoskeletal: Negative; no myalgias, joint pain, or weakness Hematologic/Oncology: Negative; no easy bruising, bleeding Endocrine: Negative; no heat/cold intolerance; no diabetes Neuro: Negative; no changes in balance, headaches Skin: Negative; No  rashes or skin lesions Psychiatric: Negative; No behavioral problems, depression Sleep: Positive for obstructive sleep apnea on CPAP therapy since 2013.  She has a Respironics dream station CPAP unit.; No snoring, daytime sleepiness, hypersomnolence, bruxism, restless legs, hypnogognic hallucinations, no cataplexy Other comprehensive 14 point system review is negative.   PHYSICAL EXAM:   VS:  BP 120/62   Pulse 75   Ht 5\' 6"  (1.676 m)   Wt 200 lb 3.2 oz (90.8 kg)   BMI 32.31 kg/m     Blood pressure by me was 126/76.  Wt Readings from Last 3 Encounters:  01/19/20 200 lb 3.2 oz (90.8 kg)  10/29/19 203 lb (92.1 kg)  07/30/19 197 lb (89.4 kg)    General: Alert, oriented, no distress.  Skin:  normal turgor, no rashes, warm and dry HEENT: Normocephalic, atraumatic. Pupils equal round and reactive to light; sclera anicteric; extraocular muscles intact;  Nose without nasal septal hypertrophy Mouth/Parynx benign; Mallinpatti scale 3 Neck: No JVD, no carotid bruits; normal carotid upstroke Lungs: Decreased breath sounds without wheezing Chest wall: without tenderness to palpitation Heart: PMI not displaced, RRR, s1 s2 normal, 1/6 systolic murmur, no diastolic murmur, no rubs, gallops, thrills, or heaves Abdomen: soft, nontender; no hepatosplenomehaly, BS+; abdominal aorta nontender and not dilated by palpation. Back: no CVA tenderness Pulses 2+ Musculoskeletal: full range of motion, normal strength, no joint deformities Extremities: no clubbing cyanosis or edema, Homan's sign negative  Neurologic: grossly nonfocal; Cranial nerves grossly wnl Psychologic: Normal mood and affect   Studies/Labs Reviewed:   EKG:  EKG is ordered today.  NSR at 75, PVC ; normal intervals  Recent Labs: BMP Latest Ref Rng & Units 09/17/2019  Glucose 65 - 99 mg/dL 110(H)  BUN 8 - 27 mg/dL 9  Creatinine 0.57 - 1.00 mg/dL 0.64  BUN/Creat Ratio 12 - 28 14  Sodium 134 - 144 mmol/L 139  Potassium 3.5 - 5.2  mmol/L 4.9  Chloride 96 - 106 mmol/L 102  CO2 20 - 29 mmol/L 23  Calcium 8.7 - 10.3 mg/dL 9.6     No flowsheet data found.  No flowsheet data found. No results found for: MCV Lab Results  Component Value Date   TSH 1.640 10/29/2019   No results found for: HGBA1C   BNP No results found for: BNP  ProBNP No results found for: PROBNP   Lipid Panel  No results found for: CHOL, TRIG, HDL, CHOLHDL, VLDL, LDLCALC, LDLDIRECT, LABVLDL   RADIOLOGY: No results found.   Additional studies/ records that were reviewed today include:  I have reviewed the records from [facility] sleep disorder center regarding her polysomnogram report from August 19, 2012.  I have reviewed the sleep evaluation of Dr. Lottie Rater from December 2018.    ASSESSMENT:    1. OSA (obstructive sleep apnea)   2. Coronary artery disease involving native coronary artery of native heart without angina pectoris   3. Tobacco abuse   4. Centrilobular emphysema ([place])   5. Essential hypertension    PLAN:  Laurie Mccoy is a very pleasant 79 year old female who moved to [place] area from [place] approximately 2 years ago.  She has significant history of tobacco use and has been found to have emphysema with several scattered groundglass nodules a calcified granuloma in her right upper lobe as well as aortic atherosclerosis and a thyroid nodule.  She has been seen by lobe our pulmonary and recently established cardiology care with Dr. Davina Poke.  Was able to review her initial sleep study done at the [facility] in [place] from August 19, 2012.  This confirmed severe sleep apnea with an AHI of 30/h.  Sleep apnea was severe and REM sleep with an AHI of 69.  She brought her machine with her today and I was able to interrogate her Respironics dream station CPAP unit.  She is compliant.  The machine is functioning appropriately.  At an average 12.6 cm pressure AHI is 1.3.  She  cleans her machine with the so clean system.  She was averaging 5.1 hours of sleep per night.  Reassured her that her machine is functioning well.  Her blood pressure today is stable.  She is on lipid-lowering therapy with her aortic atherosclerosis and continues to be  on telmisartan for hypertension.  She is on Trelegy Ellipta and as needed albuterol for her lung disease.  I discussed optimal sleep duration at 8 hours.  I reviewed with her data regarding the importance of treating her sleep apnea particularly with reference to cardiovascular health.  I will see her in 1 year for reevaluation or sooner as needed.  Medication Adjustments/Labs and Tests Ordered: Current medicines are reviewed at length with the patient today.  Concerns regarding medicines are outlined above.  Medication changes, Labs and Tests ordered today are listed in the Patient Instructions below. Patient Instructions  Medication Instructions:  CONTINUE WITH CURRENT MEDICATIONS. NO CHANGES.  *If you need a refill on your cardiac medications before your next appointment, please call your pharmacy*    Follow-Up: At [facility], you and your health needs are our priority.  As part of our continuing mission to provide you with exceptional heart care, we have created designated Provider Care Teams.  These Care Teams include your primary Cardiologist (physician) and Advanced Practice Providers (APPs -  Physician Assistants and Nurse Practitioners) who all work together to provide you with the care you need, when you need it.  We recommend signing up for the patient portal called "MyChart".  Sign up information is provided on this After Visit Summary.  MyChart is used to connect with patients for Virtual Visits (Telemedicine).  Patients are able to view lab/test results, encounter notes, upcoming appointments, etc.  Non-urgent messages can be sent to your provider as well.   To learn more about what you can do with MyChart, go to  [email or web address].    Your next appointment:   12 month(s)  The format for your next appointment:   In Person  Provider:   Shelva Majestic, MD       Signed, Shelva Majestic, MD  01/26/2020 7:21 PM    [facility] [address] [place], [place], [place]  [address] Phone: [phone]

## 2020-01-26 ENCOUNTER — Encounter: Payer: Self-pay | Admitting: Cardiovascular Disease

## 2020-02-08 ENCOUNTER — Ambulatory Visit: Payer: Medicare Other | Admitting: Cardiovascular Disease

## 2020-02-15 ENCOUNTER — Other Ambulatory Visit: Payer: Self-pay

## 2020-02-15 ENCOUNTER — Encounter (HOSPITAL_COMMUNITY): Payer: Self-pay

## 2020-02-15 ENCOUNTER — Ambulatory Visit (HOSPITAL_COMMUNITY)
Admission: RE | Admit: 2020-02-15 | Discharge: 2020-02-15 | Disposition: A | Discharge status: Home / Self Care | Payer: Medicare Other | Source: Ambulatory Visit | Attending: Pulmonary Disease | Admitting: Pulmonary Disease

## 2020-02-15 ENCOUNTER — Other Ambulatory Visit: Payer: Self-pay | Admitting: Internal Medicine

## 2020-02-15 DIAGNOSIS — R918 Other nonspecific abnormal finding of lung field: Secondary | ICD-10-CM | POA: Diagnosis not present

## 2020-03-01 ENCOUNTER — Encounter: Payer: Self-pay | Admitting: Pulmonary Disease

## 2020-03-01 ENCOUNTER — Ambulatory Visit (INDEPENDENT_AMBULATORY_CARE_PROVIDER_SITE_OTHER): Payer: Medicare Other | Admitting: Pulmonary Disease

## 2020-03-01 ENCOUNTER — Other Ambulatory Visit: Payer: Self-pay

## 2020-03-01 VITALS — BP 142/58 | HR 75 | Temp 98.9°F | Ht 65.5 in | Wt 199.6 lb

## 2020-03-01 DIAGNOSIS — R918 Other nonspecific abnormal finding of lung field: Secondary | ICD-10-CM

## 2020-03-01 DIAGNOSIS — E041 Nontoxic single thyroid nodule: Secondary | ICD-10-CM | POA: Diagnosis not present

## 2020-03-01 DIAGNOSIS — J449 Chronic obstructive pulmonary disease, unspecified: Secondary | ICD-10-CM

## 2020-03-01 DIAGNOSIS — F17210 Nicotine dependence, cigarettes, uncomplicated: Secondary | ICD-10-CM

## 2020-03-01 DIAGNOSIS — I10 Essential (primary) hypertension: Secondary | ICD-10-CM

## 2020-03-01 MED ORDER — NICOTINE 14 MG/24HR TD PT24: 14.0000 mg | MEDICATED_PATCH | Freq: Every day | TRANSDERMAL | 6 refills | Status: DC

## 2020-03-01 NOTE — Assessment & Plan Note (Signed)
Plan: Continue to not smoke Continue trelegy Ellipta inhaler We will repeat CT of chest in May/2022 We will prescribe 14 mg nicotine patch Continue to use nicotine lozenges 42-month follow-up with our office

## 2020-03-01 NOTE — Patient Instructions (Addendum)
You were seen today by Lauraine Rinne, NP  for:   1. COPD GOLD 3   Trelegy Ellipta  >>> 1 puff daily in the morning >>>rinse mouth out after use  >>> This inhaler contains 3 medications that help manage her respiratory status, contact our office if you cannot afford this medication or unable to remain on this medication  Only use your albuterol as a rescue medication to be used if you can't catch your breath by resting or doing a relaxed purse lip breathing pattern.  - The less you use it, the better it will work when you need it. - Ok to use up to 2 puffs  every 4 hours if you must but call for immediate appointment if use goes up over your usual need - Don't leave home without it !!  (think of it like the spare tire for your car)   Note your daily symptoms > remember "red flags" for COPD:   >>>Increase in cough >>>increase in sputum production >>>increase in shortness of breath or activity  intolerance.   If you notice these symptoms, please call the office to be seen.    We will repeat a CT of your chest in May/2022.  We will bring you back in 4 months to see Dr. Melvyn Novas.   2. Essential hypertension  Follow up with PCP and Cardiology about your blood pressure   3. Thyroid nodule  Continue to follow-up with endocrinology  Agree with repeating ultrasound on thyroid nodule  4. Cigarette smoker  - nicotine (NICODERM CQ) 14 mg/24hr patch; Place 1 patch (14 mg total) onto the skin daily.  Dispense: 28 patch; Refill: 6  Nicotine patches: >>>Make sure you rotate sites that you do not get skin irritation, Apply 1 patch each morning to a non-hairy skin site  If you are smoking greater than 10 cigarettes/day and weigh over 45 kg start with the nicotine patch of 21 mg a day for 6 weeks, then 14 mg a day for 2 weeks, then finished with 7 mg a day for 2 weeks, then stop  If you are smoking less than 10 cigarettes a day or weight less than 45 kg start with medium dose pack of 14 mg a day  for 6 weeks, followed by 7 mg a day for 2 weeks   >>>If insomnia occurs you are having trouble sleeping you can take the patch off at night, and place a new one on in the morning >>>If the patch is removed at night and you have morning cravings start short acting nicotine replacement therapy such as gum or lozenges  Nicotine lozenge: Lozenges are commonly uses short acting NRT product  >>>Smokers who smoke within 30 minutes of awakening should use 4 mg dose >>>Smokers who wait more than 30 minutes after awakening to smoke should use 2 mg dose  Can use up to 1 lozenge every 1-2 hours for 6 weeks >>>Total amount of lozenges that can be used per day as 20 >>>Gradually reduce number of lozenges used per day after 2 weeks of use  Place lozenge in mouth and allowed to dissolve for 30 minutes loss and does not need to be chewed  Lozenges have advantages to be able to be used in people with TMG, poor dentition, dentures   We recommend today:   Meds ordered this encounter  Medications  . nicotine (NICODERM CQ) 14 mg/24hr patch    Sig: Place 1 patch (14 mg total) onto the skin daily.  Dispense:  28 patch    Refill:  6    Follow Up:    Return in about 4 months (around 07/02/2020), or if symptoms worsen or fail to improve, for Follow up with Dr. Melvyn Novas.   Please do your part to reduce the spread of COVID-19:      Reduce your risk of any infection  and COVID19 by using the similar precautions used for avoiding the common cold or flu:  [name] Wash your hands often with soap and warm water for at least 20 seconds.  If soap and water are not readily available, use an alcohol-based hand sanitizer with at least 60% alcohol.  . If coughing or sneezing, cover your mouth and nose by coughing or sneezing into the elbow areas of your shirt or coat, into a tissue or into your sleeve (not your hands). Langley Gauss A MASK when in public  . Avoid shaking hands with others and consider head nods or verbal  greetings only. . Avoid touching your eyes, nose, or mouth with unwashed hands.  . Avoid close contact with people who are sick. . Avoid places or events with large numbers of people in one location, like concerts or sporting events. . If you have some symptoms but not all symptoms, continue to monitor at home and seek medical attention if your symptoms worsen. . If you are having a medical emergency, call 911.   [place] / e-Visit: [email or web address]         [facility]: [facility]: [phone]                   [facility]: [phone]     It is flu season:   >>> Best ways to protect herself from the flu: Receive the yearly flu vaccine, practice good hand hygiene washing with soap and also using hand sanitizer when available, eat a nutritious meals, get adequate rest, hydrate appropriately   Please contact the office if your symptoms worsen or you have concerns that you are not improving.   Thank you for choosing [facility] for your healthcare, and for allowing [place] to partner with you on your healthcare journey. I am thankful to be able to provide care to you today.   Wyn Quaker FNP-C

## 2020-03-01 NOTE — Assessment & Plan Note (Signed)
Recently stopped smoking 3 days ago Congratulated patient  Plan: Continue to not smoke 14 mg nicotine replacement patches prescribed today Continue nicotine lozenges Follow-up with our office in 4 months

## 2020-03-01 NOTE — Progress Notes (Signed)
@Patient  ID: Laurie Mccoy, female    DOB: 11-Dec-1940, 79 y.o.   MRN: [id number]  Chief Complaint  Patient presents with  . Follow-up    Referring provider: [name], NP  HPI:  79 year old female current every day smoker followed in our office for COPD  PMH: allergic rhinitis, remote episodes of depression Smoker/ Smoking History: Current every day smoker.  46-pack-year smoking history.  Smoking half pack per day. Maintenance: Trelegy Ellipta  Pt of: Dr. Melvyn Novas  03/01/2020  - Visit   79 year old female former smoker followed in our office for COPD.  Patient recently stopped smoking 3 days ago.  She is requesting nicotine replacement therapies such as patches.  She was previously tried on 21 mg felt this was too strong.  She is would like a prescription for 14 mg.  We will discuss this today.  Patient is now on Trelegy Ellipta.  She is tolerating this well.  She is not having the same symptoms that she had previously when she was trialed on this.  She feels that her breathing is improved when taking this.  She was seen by [facility] endocrinology Dr. Steffanie Dunn for her thyroid nodule.  Plan is to have a repeat ultrasound in June/2021.  Potentially may need surgical removal after that.  Patient had a recent CT chest in May/2021 that showed multiple groundglass nodules.  We will likely repeat CT in 1 year to confirm stability and monitor.  Patient denies any worsened acute symptoms such as hemoptysis, fatigue, weight loss.  Questionaires / Pulmonary Flowsheets:   ACT:  No flowsheet data found.  MMRC: mMRC Dyspnea Scale mMRC Score  03/01/2020 1    Tests:   03/10/2019-alpha-1-129, antitrypsin phenotype is PI*MM  Spirometry 12/28/2018  FEV1 0.82 (37%)  Ratio 0.56 with classic curvature  (Pt says this was over 1 year ago)   10/06/2019-CT super D chest-mild centrilobular and paraseptal emphysema, scattered groundglass nodules measuring up to 11 mm in posterior segment of right upper  lobe, calcified granuloma in the right upper lobe, aortic arthrosclerosis, dominant left thyroid nodule, recommend thyroid ultrasound  02/15/2020-CT chest without contrast-numerous bilateral groundglass pulmonary nodules as above, annual CT is recommended till 5 years of stability has been established, if persistent these nodules should be considered highly suspicious at the solid component of the nodule 6 mm or greater in size and enlarging, aortic arthrosclerosis, 2.8 cm left lobe thyroid nodule recommend thyroid ultrasound if not previously performed  FENO:  No results found for: NITRICOXIDE  PFT: No flowsheet data found.  WALK:  No flowsheet data found.  Imaging: CT Chest Wo Contrast  Result Date: 02/15/2020 CLINICAL DATA:  Left lobe thyroid nodule, bilateral ground-glass pulmonary nodules EXAM: CT CHEST WITHOUT CONTRAST TECHNIQUE: Multidetector CT imaging of the chest was performed following the standard protocol without IV contrast. COMPARISON:  10/05/2019 FINDINGS: Cardiovascular: The heart is normal in size without pericardial effusion. Stable extensive atherosclerosis of the thoracic aorta and coronary vasculature. Mediastinum/Nodes: 2.8 cm left lobe thyroid nodule again identified, unchanged. Ultrasound is recommended if not previously evaluated. No pathologic adenopathy.  The trachea and esophagus are normal. Lungs/Pleura: Upper lobe predominant emphysema is again noted. There are multiple subsolid pulmonary nodules: Right upper lobe, 7 x 5 by 8 mm, image 49 series 7, new since prior study. Right upper lobe, 13 x 4 x 10 mm, image 58 series 7, stable. Left lower lobe, 8 x 5 x 7 mm, image 54 series 7, stable. Left upper lobe, 6 x  6 x 6 mm, image 21 series 7, stable. No effusion or pneumothorax.  Central airways are patent. Upper Abdomen: No acute abnormality. Musculoskeletal: No acute displaced fractures. Reconstructed images demonstrate no additional findings. IMPRESSION: 1. Numerous  bilateral ground-glass pulmonary nodules as above. Annual CT is recommended until 5 years of stability has been established. If persistent these nodules should be considered highly suspicious if the solid component of the nodule is 6 mm or greater in size and enlarging. This recommendation follows the consensus statement: Guidelines for Management of Incidental Pulmonary Nodules Detected on CT Images: From the Fleischner Society 2017; Radiology 2017; 284:228-243. 2. Aortic Atherosclerosis (ICD10-I70.0) and Emphysema (ICD10-J43.9). 3. 2.8 cm left lobe thyroid nodule. Recommend thyroid US if not previously performed. (Ref: J Am Coll Radiol. 2015 Feb;12(2): 143-50). Electronically Signed   By: Randa Ngo M.D.   On: 02/15/2020 23:13    Lab Results:  CBC No results found for: WBC, RBC, HGB, HCT, PLT, MCV, MCH, MCHC, RDW, LYMPHSABS, MONOABS, EOSABS, BASOSABS  BMET    Component Value Date/Time   NA 139 09/17/2019 1550   K 4.9 09/17/2019 1550   CL 102 09/17/2019 1550   CO2 23 09/17/2019 1550   GLUCOSE 110 (H) 09/17/2019 1550   BUN 9 09/17/2019 1550   CREATININE 0.64 09/17/2019 1550   CALCIUM 9.6 09/17/2019 1550   GFRNONAA 87 09/17/2019 1550   GFRAA 100 09/17/2019 1550    BNP No results found for: BNP  ProBNP No results found for: PROBNP  Specialty Problems      Pulmonary Problems   COPD GOLD 3     Active smoker Spirometry 12/28/2018  FEV1 0.82 (37%)  Ratio 0.56 with classic curvature - d/c acei around 1st of May 2020 - d/c advair 500 03/10/2019   - 03/10/2019    try symbicort 160 2bid  But worse sob, more saba by 04/12/2019  - alpha one AT  03/10/19   MM   Level 129  - 04/12/2019  After extensive coaching inhaler device,  effectiveness =    90% with elipta, try trelegy  05/10/2019 >>> patient reported reaction to Trelegy Ellipta = thush/ palpitations, transition back to Symbicort 05/10/2019 >>> pulmonary function testing ordered 08/12/2019 rec incruse one click each am> cough worse, did not do  as well when d/c pred despite rx for GERD  02/27/2020-stopped smoking -       Multiple pulmonary nodules determined by computed tomography of lung    See CT  10/06/2019 > placed in tickle for f/u 6 m         Allergies  Allergen Reactions  . Compazine [Prochlorperazine Edisylate] Other (See Comments)    Facial drooping  . Trelegy Ellipta [Fluticasone-Umeclidin-Vilant] Swelling    Swelling in legs and headache.    Immunization History  Administered Date(s) Administered  . Influenza, High Dose Seasonal PF 08/08/2019  . Influenza,inj,quad, With Preservative 09/17/2017  . Influenza-Unspecified 07/07/2018  . PFIZER SARS-COV-2 Vaccination 10/21/2019, 11/10/2019  . Pneumococcal Polysaccharide-23 05/10/2019    Past Medical History:  Diagnosis Date  . Asthma   . Hypertension     Tobacco History: Social History   Tobacco Use  Smoking Status Former Smoker  . Packs/day: 0.50  . Years: 46.00  . Pack years: 23.00  . Types: Cigarettes  . Quit date: 02/27/2020  Smokeless Tobacco Never Used  Tobacco Comment   no cigs in 3 days   Counseling given: Not Answered Comment: no cigs in 3 days  Smoking assessment and cessation  counseling  Patient currently smoking: quit 3 day ago  I have advised the patient to quit/stop smoking as soon as possible due to high risk for multiple medical problems.  It will also be very difficult for [place] to manage patient's  respiratory symptoms and status if we continue to expose her lungs to a known irritant.  We do not advise e-cigarettes as a form of stopping smoking.  Patient is willing to quit smoking.  We will prescribe nicotine replacement 14 mg patches for the patient as requested.  I have encouraged her to remain using her lozenges in addition to this.  I have advised the patient that we can assist and have options of nicotine replacement therapy, provided smoking cessation education today, provided smoking cessation counseling, and provided  cessation resources.  Follow-up next office visit office visit for assessment of smoking cessation.    Smoking cessation counseling advised for: 4 min  Outpatient Encounter Medications as of 03/01/2020  Medication Sig  . albuterol (PROVENTIL) (2.5 MG/3ML) 0.083% nebulizer solution Take 3 mLs (2.5 mg total) by nebulization every 4 (four) hours as needed for wheezing or shortness of breath.  [name] albuterol (VENTOLIN HFA) 108 (90 Base) MCG/ACT inhaler Inhale 2 puffs into the lungs every 6 (six) hours as needed for wheezing or shortness of breath.  . ALPRAZolam (XANAX) 0.25 MG tablet Take 0.25 mg by mouth 2 (two) times daily as needed for anxiety.  [name] aspirin EC 81 MG tablet Take 81 mg by mouth daily.  . Fluticasone-Umeclidin-Vilant (TRELEGY ELLIPTA) 100-62.5-25 MCG/INH AEPB Inhale 1 puff into the lungs daily. Please remember to rinse your mouth!  [name] loratadine (CLARITIN) 10 MG tablet Take 10 mg by mouth daily.  . nicotine polacrilex (NICOTINE MINI) 4 MG lozenge Take 4 mg by mouth as needed for smoking cessation.  [name] telmisartan (MICARDIS) 40 MG tablet TAKE ONE TABLET BY MOUTH EVERY DAY  . [DISCONTINUED] nicotine (NICODERM CQ - DOSED IN MG/24 HOURS) 21 mg/24hr patch Place 21 mg onto the skin daily.  . nicotine (NICODERM CQ) 14 mg/24hr patch Place 1 patch (14 mg total) onto the skin daily.   No facility-administered encounter medications on file as of 03/01/2020.     Review of Systems  Review of Systems  Constitutional: Positive for fatigue. Negative for activity change and fever.  HENT: Negative for sinus pressure, sinus pain and sore throat.   Respiratory: Positive for cough. Negative for shortness of breath and wheezing.   Cardiovascular: Negative for chest pain and palpitations.  Gastrointestinal: Negative for diarrhea, nausea and vomiting.  Musculoskeletal: Negative for arthralgias.       Working with podiatry for foot pain  Neurological: Negative for dizziness.  Psychiatric/Behavioral:  Negative for sleep disturbance. The patient is not nervous/anxious.      Physical Exam  BP (!) 142/58 (BP Location: Left Arm, Cuff Size: Normal)   Pulse 75   Temp 98.9 F (37.2 C) (Oral)   Ht 5' 5.5" (1.664 m)   Wt 199 lb 9.6 oz (90.5 kg)   SpO2 96% Comment: RA  BMI 32.71 kg/m   Wt Readings from Last 5 Encounters:  03/01/20 199 lb 9.6 oz (90.5 kg)  01/19/20 200 lb 3.2 oz (90.8 kg)  10/29/19 203 lb (92.1 kg)  07/30/19 197 lb (89.4 kg)  07/28/19 198 lb (89.8 kg)    BMI Readings from Last 5 Encounters:  03/01/20 32.71 kg/m  01/19/20 32.31 kg/m  10/29/19 34.84 kg/m  07/30/19 33.81 kg/m  07/28/19 33.99 kg/m  Physical Exam Vitals and nursing note reviewed.  Constitutional:      General: She is not in acute distress.    Appearance: Normal appearance. She is normal weight.  HENT:     Head: Normocephalic and atraumatic.     Right Ear: External ear normal.     Left Ear: External ear normal.     Nose: Nose normal.  Pulmonary:     Effort: Pulmonary effort is normal. No respiratory distress.     Breath sounds: No decreased air movement. No decreased breath sounds, wheezing or rales.  Musculoskeletal:     Cervical back: Normal range of motion.  Skin:    General: Skin is warm and dry.     Capillary Refill: Capillary refill takes less than 2 seconds.  Neurological:     General: No focal deficit present.     Mental Status: She is alert and oriented to person, place, and time. Mental status is at baseline.     Gait: Gait normal.  Psychiatric:        Mood and Affect: Mood normal.        Behavior: Behavior normal.        Thought Content: Thought content normal.        Judgment: Judgment normal.       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  I recommended the patient to schedule an appointment with primary care as well as cardiology discuss her concerns regarding her blood pressure.  Patient feels that her diastolic reading on her blood pressure is too low and contributes to her fatigue.  COPD GOLD 3  Plan: Continue to not smoke Continue trelegy Ellipta inhaler We will repeat CT of chest in May/2022 We will prescribe 14 mg nicotine patch Continue to use nicotine lozenges 62-month follow-up with our office  Thyroid nodule Plan: Continue to follow-up with endocrinology Obtain repeat ultrasound as decided by patient and endocrinologist  Former smoker Recently stopped smoking 3 days ago Congratulated patient  Plan: Continue to not smoke 14 mg nicotine replacement patches prescribed today Continue nicotine lozenges Follow-up with our office in 4 months  Multiple pulmonary nodules determined by computed tomography of lung Plan: We will plan on repeating CT chest in 1 year We will discuss case with Dr. Melvyn Novas 64-month follow-up with our office    Return in about 4 months (around 07/02/2020), or if symptoms worsen or fail to improve, for Follow up with Dr. Melvyn Novas.   Lauraine Rinne, NP 03/01/2020   This appointment required 32 minutes of patient care (this includes precharting, chart review, review of results, face-to-face care, etc.).

## 2020-03-01 NOTE — Assessment & Plan Note (Signed)
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  I recommended the patient to schedule an appointment with primary care as well as cardiology discuss her concerns regarding her blood pressure.  Patient feels that her diastolic reading on her blood pressure is too low and contributes to her fatigue.

## 2020-03-01 NOTE — Assessment & Plan Note (Signed)
Plan: We will plan on repeating CT chest in 1 year We will discuss case with Dr. Melvyn Novas 19-month follow-up with our office

## 2020-03-01 NOTE — Assessment & Plan Note (Signed)
Plan: Continue to follow-up with endocrinology Obtain repeat ultrasound as decided by patient and endocrinologist

## 2020-03-07 IMAGING — CT CT CHEST SUPER D W/O CM
2 of 4 series · 11 of 36 positions shown, 13 images · non-contrast
Comparison: 09/23/2019.
COMPARISON: 09/23/2019.
COMPARISON: 09/23/2019.

Addendum:
CLINICAL DATA: Pulmonary nodule.

EXAM:
CT CHEST WITHOUT CONTRAST
TECHNIQUE: Multidetector CT imaging of the chest was performed using thin slice
collimation for electromagnetic bronchoscopy planning purposes,
without intravenous contrast.

[Series 3: chest wo · axial · 0.67mm/px · z∈[-84,+158]mm · 8 of 143 slices shown, 10 images]
[im 11/143  mediastinal]
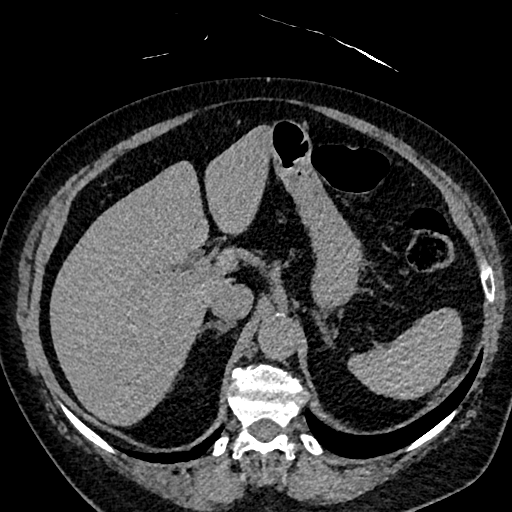
[im 11/143  lung]
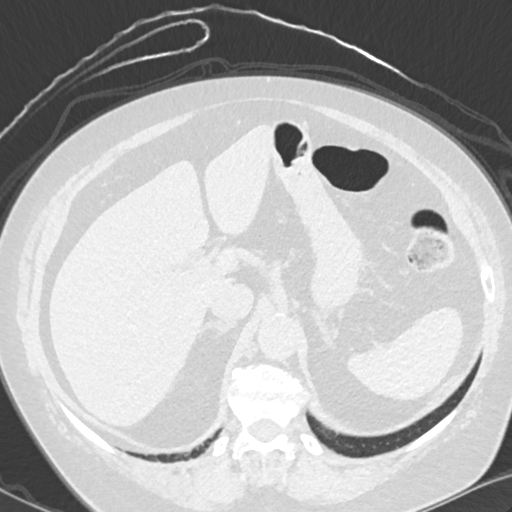
[im 33/143  lung]
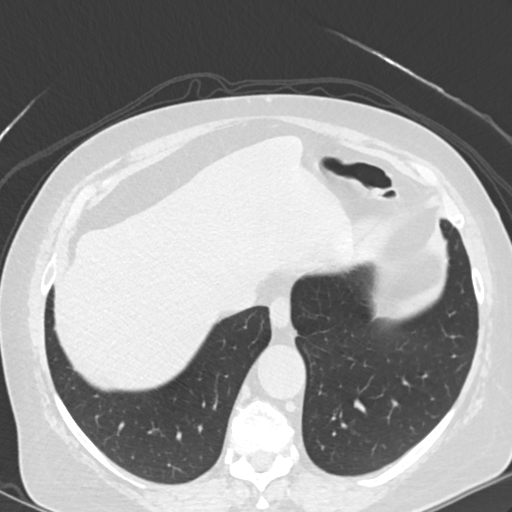
[im 44/143  lung]
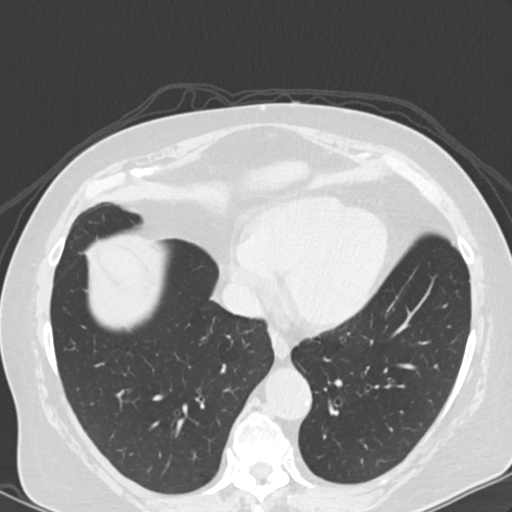
[im 66/143  lung]
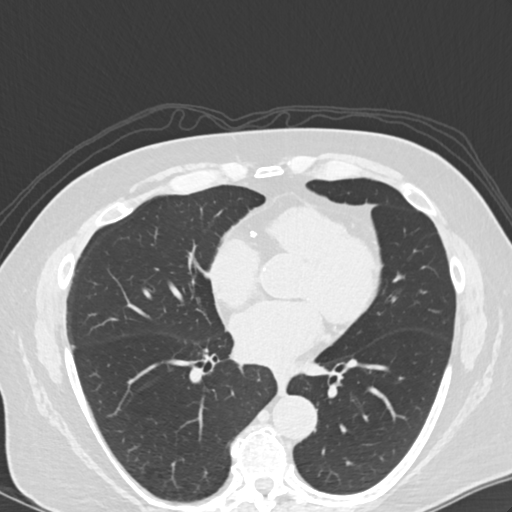
[im 77/143  mediastinal]
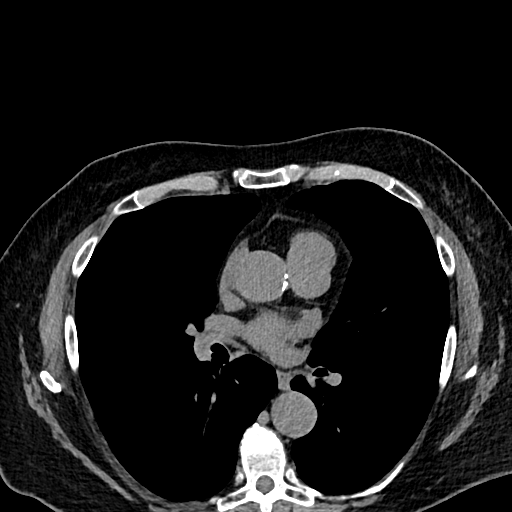
[im 77/143  lung]
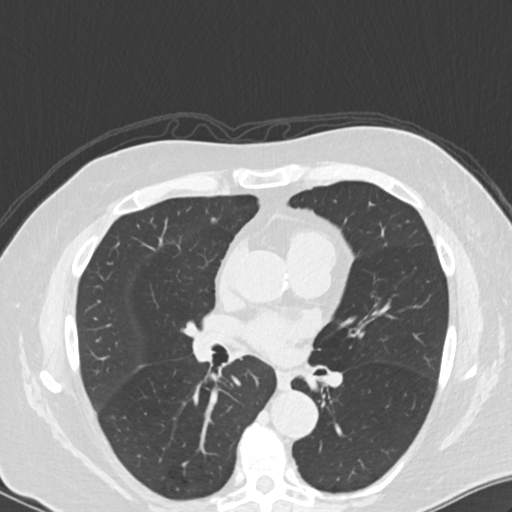
[im 99/143  lung]
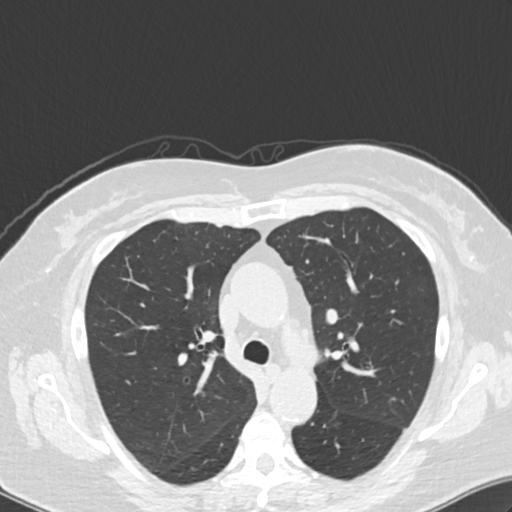
[im 110/143  lung]
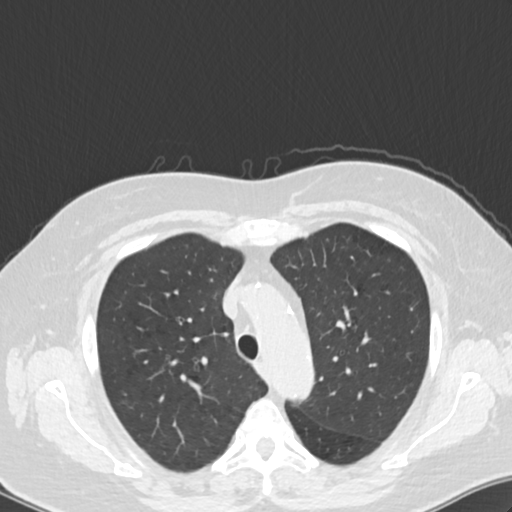
[im 132/143  lung]
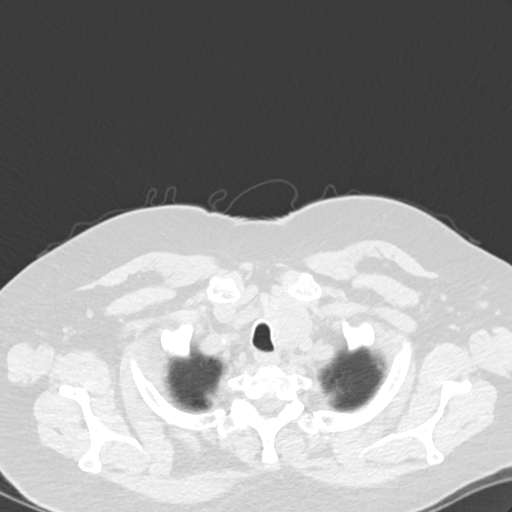

[Series 6: cor · coronal · 0.57mm/px · 3 of 162 slices shown]
[im 33/162  lung]
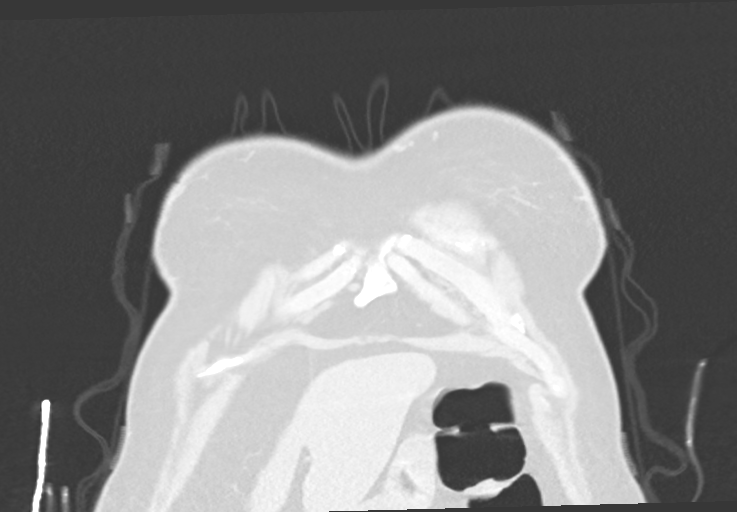
[im 65/162  lung]
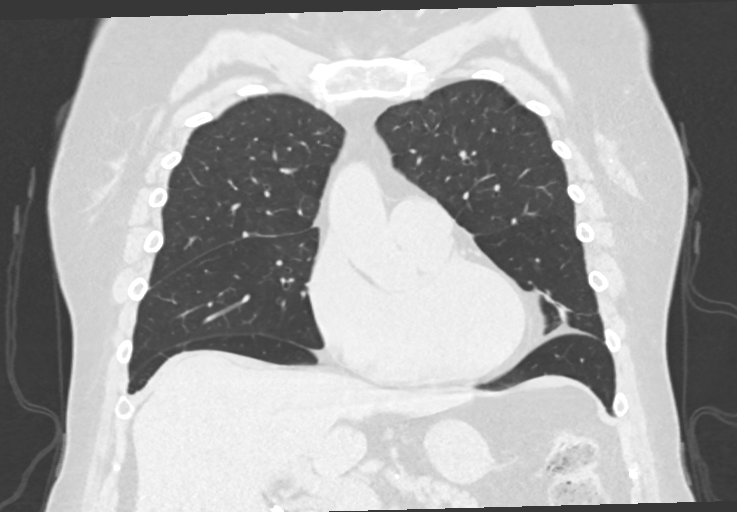
[im 97/162  lung]
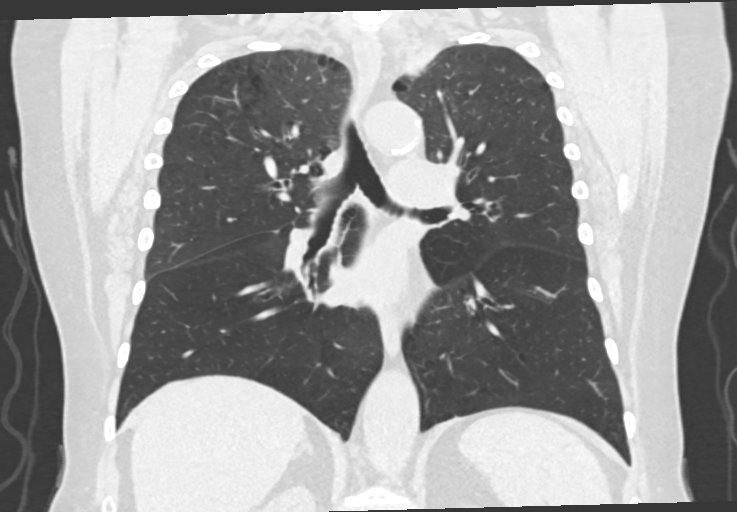

[11 of 36 positions shown; findings below may reference images not displayed]

FINDINGS: Cardiovascular: Atherosclerotic calcification of the aorta and
coronary arteries. Heart size normal. No pericardial effusion.

Mediastinum/Nodes: Low-attenuation left thyroid nodule measures
cm. No pathologically enlarged mediastinal or axillary lymph nodes.
Hilar regions are difficult to definitively evaluate without IV
contrast but appear grossly unremarkable. Esophagus is grossly
unremarkable.

Lungs/Pleura: Mild centrilobular and paraseptal emphysema. Scattered
ground-glass nodules measure up to 11 mm in the posterior segment
right upper lobe (4/56). Calcified granuloma in the right upper
lobe. Smudgy subpleural lymph nodes along the minor fissure.
Scarring/volume loss in the inferior lingula. No pleural fluid.
Airway is unremarkable.

Upper Abdomen: Visualized portions of the liver, adrenal glands,
kidneys, spleen, pancreas, stomach and bowel are grossly
unremarkable. Cholecystectomy. No upper abdominal adenopathy.

Musculoskeletal: Degenerative changes in the spine. No worrisome
lytic or sclerotic lesions.
IMPRESSION: 1. Bilateral ground-glass nodules measure up to 11 mm in the right
upper lobe. This examination will serve as baseline for future
comparison. Non-contrast chest CT at 3-6 months is recommended. If
nodules persist, subsequent management will be based upon the most
suspicious nodule(s). This recommendation follows the consensus
statement: Guidelines for Management of Incidental Pulmonary Nodules
Detected on CT Images: From the [HOSPITAL] 6546; Radiology
6546; [DATE].
2. Aortic atherosclerosis (PRV85-1Y8.8). Coronary artery
calcification.
3.  Emphysema (PRV85-CVZ.8).

ADDENDUM:
The following was discussed in the body of the report and is now
included in the impression section as follows:

4. Dominant left thyroid nodule. Recommend thyroid US(Ref: [HOSPITAL]. [DATE]): 143-50).

ADDENDUM:
2.8 cm low-attenuation left thyroid nodule. Recommend thyroid US
(ref: [HOSPITAL]. [DATE]): 143-50).These results will
be called to the ordering clinician or representative by the
Radiologist Assistant, and communication documented in the PACS or
[REDACTED].

*** End of Addendum ***
Addendum:
FINDINGS: Cardiovascular: Atherosclerotic calcification of the aorta and
coronary arteries. Heart size normal. No pericardial effusion.

Mediastinum/Nodes: Low-attenuation left thyroid nodule measures
cm. No pathologically enlarged mediastinal or axillary lymph nodes.
Hilar regions are difficult to definitively evaluate without IV
contrast but appear grossly unremarkable. Esophagus is grossly
unremarkable.

Lungs/Pleura: Mild centrilobular and paraseptal emphysema. Scattered
ground-glass nodules measure up to 11 mm in the posterior segment
right upper lobe (4/56). Calcified granuloma in the right upper
lobe. Smudgy subpleural lymph nodes along the minor fissure.
Scarring/volume loss in the inferior lingula. No pleural fluid.
Airway is unremarkable.

Upper Abdomen: Visualized portions of the liver, adrenal glands,
kidneys, spleen, pancreas, stomach and bowel are grossly
unremarkable. Cholecystectomy. No upper abdominal adenopathy.

Musculoskeletal: Degenerative changes in the spine. No worrisome
lytic or sclerotic lesions.
IMPRESSION: 1. Bilateral ground-glass nodules measure up to 11 mm in the right
upper lobe. This examination will serve as baseline for future
comparison. Non-contrast chest CT at 3-6 months is recommended. If
nodules persist, subsequent management will be based upon the most
suspicious nodule(s). This recommendation follows the consensus
statement: Guidelines for Management of Incidental Pulmonary Nodules
Detected on CT Images: From the [HOSPITAL] 6546; Radiology
6546; [DATE].
2. Aortic atherosclerosis (PRV85-1Y8.8). Coronary artery
calcification.
3.  Emphysema (PRV85-CVZ.8).

ADDENDUM:
The following was discussed in the body of the report and is now
included in the impression section as follows:

4. Dominant left thyroid nodule. Recommend thyroid US(Ref: [HOSPITAL]. [DATE]): 143-50).

*** End of Addendum ***
FINDINGS: Cardiovascular: Atherosclerotic calcification of the aorta and
coronary arteries. Heart size normal. No pericardial effusion.

Mediastinum/Nodes: Low-attenuation left thyroid nodule measures
cm. No pathologically enlarged mediastinal or axillary lymph nodes.
Hilar regions are difficult to definitively evaluate without IV
contrast but appear grossly unremarkable. Esophagus is grossly
unremarkable.

Lungs/Pleura: Mild centrilobular and paraseptal emphysema. Scattered
ground-glass nodules measure up to 11 mm in the posterior segment
right upper lobe (4/56). Calcified granuloma in the right upper
lobe. Smudgy subpleural lymph nodes along the minor fissure.
Scarring/volume loss in the inferior lingula. No pleural fluid.
Airway is unremarkable.

Upper Abdomen: Visualized portions of the liver, adrenal glands,
kidneys, spleen, pancreas, stomach and bowel are grossly
unremarkable. Cholecystectomy. No upper abdominal adenopathy.

Musculoskeletal: Degenerative changes in the spine. No worrisome
lytic or sclerotic lesions.
IMPRESSION: 1. Bilateral ground-glass nodules measure up to 11 mm in the right
upper lobe. This examination will serve as baseline for future
comparison. Non-contrast chest CT at 3-6 months is recommended. If
nodules persist, subsequent management will be based upon the most
suspicious nodule(s). This recommendation follows the consensus
statement: Guidelines for Management of Incidental Pulmonary Nodules
Detected on CT Images: From the [HOSPITAL] 6546; Radiology
6546; [DATE].
2. Aortic atherosclerosis (PRV85-1Y8.8). Coronary artery
calcification.
3.  Emphysema (PRV85-CVZ.8).

## 2020-03-08 ENCOUNTER — Telehealth: Payer: Self-pay | Admitting: Cardiovascular Disease

## 2020-03-08 NOTE — Telephone Encounter (Signed)
New message   Patient states that telmisartan (MICARDIS) 40 MG tablet this medication is causing her b/p to go up and down sporatically. Please advise.

## 2020-03-08 NOTE — Telephone Encounter (Signed)
Let her know that this is not a major issue unless she is having symptoms.  If she is asymptomatic I would recommend continue her current medications.  [place] T. Audie Box, [place]  [address], [place], [place] [address] [phone]  3:10 PM

## 2020-03-08 NOTE — Telephone Encounter (Signed)
Spoke with patient and she is concerned her diastolic blood pressure being so low Takes Telmisartan 40 mg hs. Per patient Dr Melvyn Novas started on this because he did not want her on Lisinopril  She is not taking daily secondary to her diastolic blood pressure dropping into the 40's When blood pressure drops she has fatigue Blood pressure ranging from 120's/40's-150's/60's She saw pulmonology today and they suggested reaching out to Dr Audie Box Will forward to Dr Audie Box for review

## 2020-03-08 NOTE — Telephone Encounter (Signed)
Left message to call back  

## 2020-03-09 NOTE — Telephone Encounter (Signed)
   Pt is returning call from [place] and she was advised to speak with triage  Please advise

## 2020-03-09 NOTE — Telephone Encounter (Signed)
Called patient back-  Advised of message from Dr.O'Neal.  She states that she just has no energy- so I did suggest to have her be seen by a PA to discuss if any changes to medications should be made.   Patient advised if her BP at night is low 118-120/40-50- to hold it and see what her BP would be in the mornings.  She advised understanding, will call as soon as she gets back into town to make an appointment.

## 2020-03-27 NOTE — Progress Notes (Signed)
Cardiology Office Note:   Date:  03/28/2020  NAME:  Laurie Mccoy    MRN: [id number] DOB:  07-23-1941   PCP:  [name], NP  Cardiologist:  Shelva Majestic, MD   Referring MD: [name], NP   Chief Complaint  Patient presents with  . Hypertension   History of Present Illness:   Laurie Mccoy is a 79 y.o. female with a hx of HTN, non-obstructive CAD, HLD who presets for follow-up. Recently BP medications changed to telmisartan per pulmonary. Has had low DBPs. Follow-up today.  She reports she was switched to an ARB from lisinopril in January.  Unclear reasons for this.  She apparently had low diastolic blood pressure since that time.  She reports that her diastolic blood pressures range in the 50-60 range.  Systolically her pressures range in the 1 30-1 60 range.  She reports that she does not feel well and she feels fatigued when her blood pressure is low like this.  She was evaluated for lower extreme edema and did not have any blood clots.  She seems to be doing okay with still has intermittent episodes of heart racing.  She reports they last seconds and resolve without intervention.  She reports that she still does get some intermittent episodes of chest pain but they resolve quickly and are sharp.  We did review her lipid profile in office it shows an LDL of 77 and an HDL of 62.  She appears to be stable taking her statin infrequently.  Overall main issue today is blood pressure.  She is still smoking but working on quitting.  She is also worked on changing her diet and has made some headway in her husband's diet as well.  Problem List 1. 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 Tobacco abuse 3. Hypertension  4. OSA  Past Medical History: Past Medical History:  Diagnosis Date  . Asthma   . Hypertension     Past Surgical History: Past Surgical History:  Procedure Laterality Date  . APPENDECTOMY    . CHOLECYSTECTOMY    . COLON  RESECTION    . hernia      Current Medications: Current Meds  Medication Sig  . albuterol (PROVENTIL) (2.5 MG/3ML) 0.083% nebulizer solution Take 3 mLs (2.5 mg total) by nebulization every 4 (four) hours as needed for wheezing or shortness of breath.  [name] albuterol (VENTOLIN HFA) 108 (90 Base) MCG/ACT inhaler Inhale 2 puffs into the lungs every 6 (six) hours as needed for wheezing or shortness of breath.  . ALPRAZolam (XANAX) 0.25 MG tablet Take 0.25 mg by mouth 2 (two) times daily as needed for anxiety.  [name] aspirin EC 81 MG tablet Take 81 mg by mouth daily.  . Fluticasone-Umeclidin-Vilant (TRELEGY ELLIPTA) 100-62.5-25 MCG/INH AEPB Inhale 1 puff into the lungs daily. Please remember to rinse your mouth!  [name] loratadine (CLARITIN) 10 MG tablet Take 10 mg by mouth daily.  . nicotine (NICODERM CQ) 14 mg/24hr patch Place 1 patch (14 mg total) onto the skin daily.  . nicotine polacrilex (NICOTINE MINI) 4 MG lozenge Take 4 mg by mouth as needed for smoking cessation.  . [DISCONTINUED] telmisartan (MICARDIS) 40 MG tablet TAKE ONE TABLET BY MOUTH EVERY DAY     Allergies:    Trelegy ellipta [fluticasone-umeclidin-vilant]   Social History: Social History   Socioeconomic History  . Marital status: Married    Spouse name: Not on file  . Number of children: Not on  file  . Years of education: Not on file  . Highest education level: Not on file  Occupational History  . Not on file  Tobacco Use  . Smoking status: Former Smoker    Packs/day: 0.50    Years: 46.00    Pack years: 23.00    Types: Cigarettes    Quit date: 02/27/2020    Years since quitting: 0.0  . Smokeless tobacco: Never Used  . Tobacco comment: no cigs in 3 days  Vaping Use  . Vaping Use: Never used  Substance and Sexual Activity  . Alcohol use: Not on file  . Drug use: Not on file  . Sexual activity: Not on file  Other Topics Concern  . Not on file  Social History Narrative  . Not on file   Social Determinants of Health     Financial Resource Strain:   . Difficulty of Paying Living Expenses:   Food Insecurity:   . Worried About Charity fundraiser in the Last Year:   . Arboriculturist in the Last Year:   Transportation Needs:   . Film/video editor (Medical):   [name] Lack of Transportation (Non-Medical):   Physical Activity:   . Days of Exercise per Week:   . Minutes of Exercise per Session:   Stress:   . Feeling of Stress :   Social Connections:   . Frequency of Communication with Friends and Family:   . Frequency of Social Gatherings with Friends and Family:   . Attends Religious Services:   . Active Member of Clubs or Organizations:   . Attends Archivist Meetings:   [name] Marital Status:      Family History: The patient's family history includes Brain cancer in her mother; Lung cancer in her father and mother; Stroke in her brother.  ROS:   All other ROS reviewed and negative. Pertinent positives noted in the HPI.     EKGs/Labs/Other Studies Reviewed:   The following studies were personally reviewed by me today:  Recent Labs: 09/17/2019: BUN 9; Creatinine, Ser 0.64; Potassium 4.9; Sodium 139 10/29/2019: TSH 1.640   Recent Lipid Panel No results found for: CHOL, TRIG, HDL, CHOLHDL, VLDL, LDLCALC, LDLDIRECT  Physical Exam:   VS:  BP 130/86 (BP Location: Left Arm, Patient Position: Sitting, Cuff Size: Large)   Pulse 80   Temp (!) 97.2 F (36.2 C)   Ht 5\' 6"  (1.676 m)   Wt 199 lb (90.3 kg)   SpO2 94%   BMI 32.12 kg/m    Wt Readings from Last 3 Encounters:  03/28/20 199 lb (90.3 kg)  03/01/20 199 lb 9.6 oz (90.5 kg)  01/19/20 200 lb 3.2 oz (90.8 kg)    General: Well nourished, well developed, in no acute distress Heart: Atraumatic, normal size  Eyes: PEERLA, EOMI  Neck: Supple, no JVD Endocrine: No thryomegaly Cardiac: Normal S1, S2; RRR; no murmurs, rubs, or gallops Lungs: Clear to auscultation bilaterally, no wheezing, rhonchi or rales  Abd: Soft, nontender, no  hepatomegaly  Ext: No edema, pulses 2+ Musculoskeletal: No deformities, BUE and BLE strength normal and equal Skin: Warm and dry, no rashes   Neuro: Alert and oriented to person, place, time, and situation, CNII-XII grossly intact, no focal deficits  Psych: Normal mood and affect   ASSESSMENT:   Laurie Mccoy is a 79 y.o. female who presents for the following: 1. Coronary artery disease involving native coronary artery of native heart without angina pectoris   2.  Essential hypertension   3. Mixed hyperlipidemia   4. Palpitations     PLAN:   1. Coronary artery disease involving native coronary artery of native heart without angina pectoris -Nonobstructive CAD on coronary CTA.  No concerning symptoms.  She will continue aspirin and statin.  Most recent LDL cholesterol 77 through the [facility] system.  Her HDL is high as well.  2. Essential hypertension -We will go ahead and start her on amlodipine 10 mg daily.  She will stop her ARB.  3. Mixed hyperlipidemia -Continue statin  4. Palpitations -Episodes of palpitations.  She has been monitored in the past.  I did discuss the cardia mobile device with her.  This may be an option.  Pulse is regular on exam today.   Disposition: Return in about 3 months (around 06/28/2020).  Medication Adjustments/Labs and Tests Ordered: Current medicines are reviewed at length with the patient today.  Concerns regarding medicines are outlined above.  No orders of the defined types were placed in this encounter.  Meds ordered this encounter  Medications  . amLODipine (NORVASC) 10 MG tablet    Sig: Take 1 tablet (10 mg total) by mouth daily.    Dispense:  180 tablet    Refill:  3    Patient Instructions  Medication Instructions:  Stop Telmisartan  Start Amlodipine 10 mg daily   *If you need a refill on your cardiac medications before your next appointment, please call your pharmacy*   Follow-Up: At [facility], you and your health needs  are our priority.  As part of our continuing mission to provide you with exceptional heart care, we have created designated Provider Care Teams.  These Care Teams include your primary Cardiologist (physician) and Advanced Practice Providers (APPs -  Physician Assistants and Nurse Practitioners) who all work together to provide you with the care you need, when you need it.  We recommend signing up for the patient portal called "MyChart".  Sign up information is provided on this After Visit Summary.  MyChart is used to connect with patients for Virtual Visits (Telemedicine).  Patients are able to view lab/test results, encounter notes, upcoming appointments, etc.  Non-urgent messages can be sent to your provider as well.   To learn more about what you can do with MyChart, go to [email or web address].    Your next appointment:   3 month(s)  The format for your next appointment:   In Person  Provider:   Eleonore Chiquito, MD       Time Spent with Patient: I have spent a total of 35 minutes with patient reviewing hospital notes, telemetry, EKGs, labs and examining the patient as well as establishing an assessment and plan that was discussed with the patient.  > 50% of time was spent in direct patient care.  Signed, Addison Naegeli. Audie Box, [place]  [address] [place] [place], [place] [address] [phone]  03/28/2020 3:10 PM

## 2020-03-28 ENCOUNTER — Ambulatory Visit (INDEPENDENT_AMBULATORY_CARE_PROVIDER_SITE_OTHER): Payer: Medicare Other | Admitting: Cardiovascular Disease

## 2020-03-28 ENCOUNTER — Other Ambulatory Visit: Payer: Self-pay

## 2020-03-28 ENCOUNTER — Encounter: Payer: Self-pay | Admitting: Cardiovascular Disease

## 2020-03-28 VITALS — BP 130/86 | HR 80 | Temp 97.2°F | Ht 66.0 in | Wt 199.0 lb

## 2020-03-28 DIAGNOSIS — R002 Palpitations: Secondary | ICD-10-CM | POA: Diagnosis not present

## 2020-03-28 DIAGNOSIS — E782 Mixed hyperlipidemia: Secondary | ICD-10-CM | POA: Diagnosis not present

## 2020-03-28 DIAGNOSIS — I10 Essential (primary) hypertension: Secondary | ICD-10-CM

## 2020-03-28 DIAGNOSIS — I2510 Atherosclerotic heart disease of native coronary artery without angina pectoris: Secondary | ICD-10-CM | POA: Diagnosis not present

## 2020-03-28 MED ORDER — AMLODIPINE BESYLATE 10 MG PO TABS: 10.0000 mg | ORAL_TABLET | Freq: Every day | ORAL | 3 refills | Status: DC

## 2020-03-28 NOTE — Patient Instructions (Signed)
Medication Instructions:  Stop Telmisartan  Start Amlodipine 10 mg daily   *If you need a refill on your cardiac medications before your next appointment, please call your pharmacy*   Follow-Up: At [facility], you and your health needs are our priority.  As part of our continuing mission to provide you with exceptional heart care, we have created designated Provider Care Teams.  These Care Teams include your primary Cardiologist (physician) and Advanced Practice Providers (APPs -  Physician Assistants and Nurse Practitioners) who all work together to provide you with the care you need, when you need it.  We recommend signing up for the patient portal called "MyChart".  Sign up information is provided on this After Visit Summary.  MyChart is used to connect with patients for Virtual Visits (Telemedicine).  Patients are able to view lab/test results, encounter notes, upcoming appointments, etc.  Non-urgent messages can be sent to your provider as well.   To learn more about what you can do with MyChart, go to [email or web address].    Your next appointment:   3 month(s)  The format for your next appointment:   In Person  Provider:   Eleonore Chiquito, MD

## 2020-03-31 NOTE — Telephone Encounter (Signed)
Spoke with pt, aware the amlodipine has not had time to work fully yet. Also advise her to take her bp only once daily. Preoccupation with bp will only make the numbers higher. Patient voiced understanding and will call after a couple weeks if bp runs high.

## 2020-04-21 ENCOUNTER — Telehealth: Payer: Self-pay | Admitting: Cardiovascular Disease

## 2020-04-21 MED ORDER — TELMISARTAN 20 MG PO TABS: 20.0000 mg | ORAL_TABLET | Freq: Every day | ORAL | 0 refills | Status: DC

## 2020-04-21 NOTE — Telephone Encounter (Signed)
Pt c/o swelling: STAT is pt has developed SOB within 24 hours  1) How much weight have you gained and in what time span? No weight gain   2) If swelling, where is the swelling located? Legs and feet  3) Are you currently taking a fluid pill? No  4) Are you currently SOB? No  5) Do you have a log of your daily weights (if so, list)? No   6) Have you gained 3 pounds in a day or 5 pounds in a week? No  7) Have you traveled recently? Yes, patient is in [place], [place] caring for her brother

## 2020-04-21 NOTE — Telephone Encounter (Signed)
Started after starting Amlodipine. Was taking Lisinopril HCT and pulmonologist wanted changed  Feet, ankles, and legs swelling Did have doppler of legs and was negative  Denies any change in breathing Will forward to Pharm D for review

## 2020-04-21 NOTE — Telephone Encounter (Signed)
Advised patient, verbalized understanding Scheduled follow up visit

## 2020-04-21 NOTE — Telephone Encounter (Signed)
Patient changed from telmisartan to amlodipine on Dr Audie Box on 03/28/2020.  Please resume telmisartan 40mg  at 1/2 tablet daily, and schedule follow up with Dr Audie Box or APP for medication adjustment

## 2020-04-23 NOTE — Telephone Encounter (Signed)
Thanks, we will see her on 7/30.   [place] T. Audie Box, [place]  [address], [place], [place] [address] [phone]  4:27 PM

## 2020-04-25 ENCOUNTER — Other Ambulatory Visit: Payer: Self-pay

## 2020-04-25 MED ORDER — HYDROCHLOROTHIAZIDE 25 MG PO TABS: 25.0000 mg | ORAL_TABLET | Freq: Every day | ORAL | 3 refills | Status: DC

## 2020-05-03 NOTE — Progress Notes (Signed)
Cardiology Office Note:   Date:  05/05/2020  NAME:  Laurie Mccoy    MRN: [id number] DOB:  1941/09/26   PCP:  [name], NP  Cardiologist:  Shelva Majestic, MD   Referring MD: [name], NP   Chief Complaint  Patient presents with  . Hypertension   History of Present Illness:   Laurie Mccoy is a 79 y.o. female with a hx of non-obstructive CAD, HTN, HLD, tobacco abuse who presents for follow-up of hypertension. She was evaluated last month for low DBP. We stopped her ARB and started norvasc. She has LE edema and we put her on HCTZ. Had a lot of swelling with amlodipine. Seems to be doing much better on HCTZ. She reports she still has minimal swelling in her legs but they are drastically improved. She brings a log of her blood pressure which show systolic blood pressures that range from the one 30-1 50 range. Diastolic blood pressure in the 60-70 range. Heart rates are well controlled in the 60-70 range. She reports she is still smoking about 5 cigarettes/day. She is not exercising routinely but does have plans to get back to being a little more active. We did talk about water aerobics and other activities that may be beneficial to her. Overall appears to be doing well. Continue current blood pressure regimen. Denies chest pain, shortness breath or palpitations today.  Problem List 1. 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 Tobacco abuse 3. Hypertension 4. OSA   Past Medical History: Past Medical History:  Diagnosis Date  . Asthma   . Hypertension     Past Surgical History: Past Surgical History:  Procedure Laterality Date  . APPENDECTOMY    . CHOLECYSTECTOMY    . COLON RESECTION    . hernia      Current Medications: Current Meds  Medication Sig  . albuterol (PROVENTIL) (2.5 MG/3ML) 0.083% nebulizer solution Take 3 mLs (2.5 mg total) by nebulization every 4 (four) hours as needed for wheezing or shortness of breath.  [name]  albuterol (VENTOLIN HFA) 108 (90 Base) MCG/ACT inhaler Inhale 2 puffs into the lungs every 6 (six) hours as needed for wheezing or shortness of breath.  . ALPRAZolam (XANAX) 0.25 MG tablet Take 0.25 mg by mouth 2 (two) times daily as needed for anxiety.  [name] aspirin EC 81 MG tablet Take 81 mg by mouth daily.  . Fluticasone-Umeclidin-Vilant (TRELEGY ELLIPTA) 100-62.5-25 MCG/INH AEPB Inhale 1 puff into the lungs daily. Please remember to rinse your mouth!  . hydrochlorothiazide (HYDRODIURIL) 25 MG tablet Take 1 tablet (25 mg total) by mouth daily.  [name] loratadine (CLARITIN) 10 MG tablet Take 10 mg by mouth daily.  . nicotine (NICODERM CQ) 14 mg/24hr patch Place 1 patch (14 mg total) onto the skin daily.  . nicotine polacrilex (NICOTINE MINI) 4 MG lozenge Take 4 mg by mouth as needed for smoking cessation.  [name] telmisartan (MICARDIS) 20 MG tablet Take 1 tablet (20 mg total) by mouth daily.     Allergies:    Norvasc [amlodipine] and Trelegy ellipta [fluticasone-umeclidin-vilant]   Social History: Social History   Socioeconomic History  . Marital status: Married    Spouse name: Not on file  . Number of children: Not on file  . Years of education: Not on file  . Highest education level: Not on file  Occupational History  . Not on file  Tobacco Use  . Smoking status: Former Smoker    Packs/day: 0.50  Years: 46.00    Pack years: 23.00    Types: Cigarettes    Quit date: 02/27/2020    Years since quitting: 0.1  . Smokeless tobacco: Never Used  . Tobacco comment: no cigs in 3 days  Vaping Use  . Vaping Use: Never used  Substance and Sexual Activity  . Alcohol use: Not on file  . Drug use: Not on file  . Sexual activity: Not on file  Other Topics Concern  . Not on file  Social History Narrative  . Not on file   Social Determinants of Health   Financial Resource Strain:   . Difficulty of Paying Living Expenses:   Food Insecurity:   . Worried About Charity fundraiser in the Last  Year:   . Arboriculturist in the Last Year:   Transportation Needs:   . Film/video editor (Medical):   [name] Lack of Transportation (Non-Medical):   Physical Activity:   . Days of Exercise per Week:   . Minutes of Exercise per Session:   Stress:   . Feeling of Stress :   Social Connections:   . Frequency of Communication with Friends and Family:   . Frequency of Social Gatherings with Friends and Family:   . Attends Religious Services:   . Active Member of Clubs or Organizations:   . Attends Archivist Meetings:   [name] Marital Status:      Family History: The patient's family history includes Brain cancer in her mother; Lung cancer in her father and mother; Stroke in her brother.  ROS:   All other ROS reviewed and negative. Pertinent positives noted in the HPI.     EKGs/Labs/Other Studies Reviewed:   The following studies were personally reviewed by me today:  Recent Labs: 09/17/2019: BUN 9; Creatinine, Ser 0.64; Potassium 4.9; Sodium 139 10/29/2019: TSH 1.640   Recent Lipid Panel No results found for: CHOL, TRIG, HDL, CHOLHDL, VLDL, LDLCALC, LDLDIRECT  Physical Exam:   VS:  BP (!) 135/63   Pulse 88   Ht 5' (1.524 m)   Wt 195 lb (88.5 kg)   SpO2 97%   BMI 38.08 kg/m    Wt Readings from Last 3 Encounters:  05/05/20 195 lb (88.5 kg)  03/28/20 199 lb (90.3 kg)  03/01/20 199 lb 9.6 oz (90.5 kg)    General: Well nourished, well developed, in no acute distress Heart: Atraumatic, normal size  Eyes: PEERLA, EOMI  Neck: Supple, no JVD Endocrine: No thryomegaly Cardiac: Normal S1, S2; RRR; no murmurs, rubs, or gallops Lungs: Clear to auscultation bilaterally, no wheezing, rhonchi or rales  Abd: Soft, nontender, no hepatomegaly  Ext: No edema, pulses 2+ Musculoskeletal: No deformities, BUE and BLE strength normal and equal Skin: Warm and dry, no rashes   Neuro: Alert and oriented to person, place, time, and situation, CNII-XII grossly intact, no focal deficits    Psych: Normal mood and affect   ASSESSMENT:   Laurie Mccoy is a 79 y.o. female who presents for the following: 1. Coronary artery disease involving native coronary artery of native heart without angina pectoris   2. Mixed hyperlipidemia   3. Essential hypertension   4. Tobacco abuse     PLAN:   1. 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 25-49% lesions  -LDL 62, HDL 80 -Continue aspirin and statin.  2. Mixed hyperlipidemia -Aspirin statin. Most recent LDL 62. HDL 80.  3.  Essential hypertension -She did not tolerate amlodipine. Had swelling. Continue ARB and HCTZ 25 mg daily. Blood pressure is well controlled today. We will continue his current regimen.  4. Tobacco abuse -Still smoking 5 cigarettes/day. Tobacco cessation counseling provided.  Disposition: Return in about 6 months (around 11/05/2020).  Medication Adjustments/Labs and Tests Ordered: Current medicines are reviewed at length with the patient today.  Concerns regarding medicines are outlined above.  No orders of the defined types were placed in this encounter.  No orders of the defined types were placed in this encounter.   Patient Instructions  Medication Instructions:  The current medical regimen is effective;  continue present plan and medications.  *If you need a refill on your cardiac medications before your next appointment, please call your pharmacy*   Follow-Up: At [facility], you and your health needs are our priority.  As part of our continuing mission to provide you with exceptional heart care, we have created designated Provider Care Teams.  These Care Teams include your primary Cardiologist (physician) and Advanced Practice Providers (APPs -  Physician Assistants and Nurse Practitioners) who all work together to provide you with the care you need, when you need it.  We recommend signing up for the  patient portal called "MyChart".  Sign up information is provided on this After Visit Summary.  MyChart is used to connect with patients for Virtual Visits (Telemedicine).  Patients are able to view lab/test results, encounter notes, upcoming appointments, etc.  Non-urgent messages can be sent to your provider as well.   To learn more about what you can do with MyChart, go to [email or web address].    Your next appointment:   6 month(s)  The format for your next appointment:   In Person  Provider:   Eleonore Chiquito, MD        Time Spent with Patient: I have spent a total of 25 minutes with patient reviewing hospital notes, telemetry, EKGs, labs and examining the patient as well as establishing an assessment and plan that was discussed with the patient.  > 50% of time was spent in direct patient care.  Signed, Addison Naegeli. Audie Box, [place]  [address] [place], [place] [address] [phone]  05/05/2020 3:51 PM

## 2020-05-05 ENCOUNTER — Encounter: Payer: Self-pay | Admitting: Cardiovascular Disease

## 2020-05-05 ENCOUNTER — Ambulatory Visit (INDEPENDENT_AMBULATORY_CARE_PROVIDER_SITE_OTHER): Payer: Medicare Other | Admitting: Cardiovascular Disease

## 2020-05-05 ENCOUNTER — Other Ambulatory Visit: Payer: Self-pay

## 2020-05-05 VITALS — BP 135/63 | HR 88 | Ht 60.0 in | Wt 195.0 lb

## 2020-05-05 DIAGNOSIS — Z720 Tobacco use: Secondary | ICD-10-CM | POA: Diagnosis not present

## 2020-05-05 DIAGNOSIS — I2510 Atherosclerotic heart disease of native coronary artery without angina pectoris: Secondary | ICD-10-CM

## 2020-05-05 DIAGNOSIS — I10 Essential (primary) hypertension: Secondary | ICD-10-CM | POA: Diagnosis not present

## 2020-05-05 DIAGNOSIS — E782 Mixed hyperlipidemia: Secondary | ICD-10-CM | POA: Diagnosis not present

## 2020-05-05 NOTE — Patient Instructions (Signed)
Medication Instructions:  The current medical regimen is effective;  continue present plan and medications.  *If you need a refill on your cardiac medications before your next appointment, please call your pharmacy*   Follow-Up: At [facility], you and your health needs are our priority.  As part of our continuing mission to provide you with exceptional heart care, we have created designated Provider Care Teams.  These Care Teams include your primary Cardiologist (physician) and Advanced Practice Providers (APPs -  Physician Assistants and Nurse Practitioners) who all work together to provide you with the care you need, when you need it.  We recommend signing up for the patient portal called "MyChart".  Sign up information is provided on this After Visit Summary.  MyChart is used to connect with patients for Virtual Visits (Telemedicine).  Patients are able to view lab/test results, encounter notes, upcoming appointments, etc.  Non-urgent messages can be sent to your provider as well.   To learn more about what you can do with MyChart, go to [email or web address].    Your next appointment:   6 month(s)  The format for your next appointment:   In Person  Provider:   McIntosh O'Neal, MD      

## 2020-07-03 ENCOUNTER — Ambulatory Visit (INDEPENDENT_AMBULATORY_CARE_PROVIDER_SITE_OTHER): Payer: Medicare Other | Admitting: Internal Medicine

## 2020-07-03 ENCOUNTER — Other Ambulatory Visit: Payer: Self-pay

## 2020-07-03 ENCOUNTER — Encounter: Payer: Self-pay | Admitting: Internal Medicine

## 2020-07-03 DIAGNOSIS — R918 Other nonspecific abnormal finding of lung field: Secondary | ICD-10-CM

## 2020-07-03 DIAGNOSIS — I2510 Atherosclerotic heart disease of native coronary artery without angina pectoris: Secondary | ICD-10-CM | POA: Diagnosis not present

## 2020-07-03 DIAGNOSIS — J449 Chronic obstructive pulmonary disease, unspecified: Secondary | ICD-10-CM

## 2020-07-03 DIAGNOSIS — F17210 Nicotine dependence, cigarettes, uncomplicated: Secondary | ICD-10-CM | POA: Diagnosis not present

## 2020-07-03 MED ORDER — PREDNISONE 10 MG PO TABS: ORAL_TABLET | ORAL | 0 refills | Status: DC

## 2020-07-03 MED ORDER — BREZTRI AEROSPHERE 160-9-4.8 MCG/ACT IN AERO: 2.0000 | INHALATION_SPRAY | Freq: Two times a day (BID) | RESPIRATORY_TRACT | 0 refills | Status: DC

## 2020-07-03 MED ORDER — AZITHROMYCIN 250 MG PO TABS: ORAL_TABLET | ORAL | 0 refills | Status: DC

## 2020-07-03 NOTE — Assessment & Plan Note (Signed)

## 2020-07-03 NOTE — Assessment & Plan Note (Addendum)
Active smoker Spirometry 12/28/2018  FEV1 0.82 (37%)  Ratio 0.56 with classic curvature - d/c acei around 1st of May 2020 - d/c advair 500 03/10/2019   - 03/10/2019    try symbicort 160 2bid  But worse sob, more saba by 04/12/2019  - alpha one AT  03/10/19   MM   Level 129  - 04/12/2019  After extensive coaching inhaler device,  effectiveness =    90% with elipta, try trelegy  05/10/2019 >>> patient reported reaction to Trelegy Ellipta = thush/ palpitations, transition back to Symbicort 05/10/2019 >>> pulmonary function testing ordered 08/12/2019 rec incruse one click each am> cough worse, did not do as well when d/c pred despite rx for GERD - 07/03/2020  After extensive coaching inhaler device,  effectiveness =    90% on 2nd try.    Group D in terms of symptom/risk and laba/lama/ICS  therefore appropriate rx at this point >>>  trelegy ok until cough started and now aggravates the cough so try breztri 2bid x 2 weeks then decide which se likes better   For flare rec zpak/ prednisone x 6 days   Re saba I spent extra time with pt today reviewing appropriate use of albuterol for prn use on exertion with the following points: 1) saba is for relief of sob that does not improve by walking a slower pace or resting but rather if the pt does not improve after trying this first. 2) If the pt is convinced, as many are, that saba helps recover from activity faster then it's easy to tell if this is the case by re-challenging : ie stop, take the inhaler, then p 5 minutes try the exact same activity (intensity of workload) that just caused the symptoms and see if they are substantially diminished or not after saba 3) if there is an activity that reproducibly causes the symptoms, try the saba 15 min before the activity on alternate days   If in fact the saba really does help, then fine to continue to use it prn but advised may need to look closer at the maintenance regimen being used to achieve better control of airways disease  with exertion.

## 2020-07-03 NOTE — Patient Instructions (Addendum)
Prednisone 10 mg take  4 each am x 2 days,   2 each am x 2 days,  1 each am x 2 days and stop   zpak   Instead of Trelegy especially  Breztri Take 2 puffs first thing in am and then another 2 puffs about 12 hours later.  If you prefer it I would be happy to call it in for you  Work on inhaler technique:  relax and gently blow all the way out then take a nice smooth deep breath back in, triggering the inhaler at same time you start breathing in.  Hold for up to 5 seconds if you can. Blow breztri out thru nose. Rinse and gargle with water when done.   Please schedule a follow up visit in 6  months but call sooner if needed

## 2020-07-03 NOTE — Assessment & Plan Note (Signed)
See CT  10/06/2019   - CT chest 02/15/20 1. Numerous bilateral ground-glass pulmonary nodules  On new  7 x 5 x 8 RUL , all others s change  Annual CT is recommended until 5 years of stability has been established. If persistent these nodules should be considered highly suspicious if the solid component of the nodule is 6 mm or greater in size and enlarging. > annual x one year  2. Aortic Atherosclerosis (ICD10-I70.0) and Emphysema (ICD10-J43.9). 3. 2.8 cm left lobe thyroid nodule.   >>> f/u Dr  Steffanie Dunn in [place]    These are not solid enough or large enough for PET or bx, not suspicious enough for excisional bx > really only option for now is follow the Fleischner society guidelines as rec by radiology = yearly f/u unless becomes symptomatic  Discussed in detail all the  indications, usual  risks and alternatives  relative to the benefits with patient who agrees to proceed with conservative f/u with yearly CT x 5 years> placed in reminder file for 02/14/21

## 2020-07-03 NOTE — Progress Notes (Signed)
Laurie Mccoy, female    DOB: [birth year]     MRN: [id number]   Brief patient profile:  71 yowf active smoker prev worked with GP's  from [place] moved to [place] to live near her sons/grandchildren with onset around 1990 doe worse in hot/humid weather and with uri's much worse in winter of 2020 while on ACEi but blood pressure got so low and better since but wants to be cleared for umbilical surgery  in [place] wanted her cleared for 3rd surgery (original was for diverticulitis surgery) so referred to pulmonary clinic 03/10/2019 by Dr  Alvino Chapel.     History of Present Illness  03/10/2019  Pulmonary/ 1st office eval/Laurie Mccoy  Off acei x early May 2020  Chief Complaint  Patient presents with  . Pulmonary Consult    Referred by Dr. Charlane Ferretti for pulmonary clearance for hernia repair. Pt states she has had asthma "for a long time". She states having increased SOB since since she was sick in Feb 2020.   Dyspnea:  Slowed by feet > sob at present Cough: advair irritation mouth throat but really no cough since off acei  Sleep: on side electric bed just a little / cpap  SABA use: albuterol 4-5 mdi/ never neb anymore Singulair could not take bad dreams   rec The key is to stop smoking completely before smoking completely stops you! -For smoking cessation classes/info call [phone]    Plan A = Automatic = stop advair/ start Symbicort 160 Take 2 puffs first thing in am and then another 2 puffs about 12 hours later.  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 Follow-up    Good days and bad days with her breathing- depends on the heat and humidity. She is using her proair 5 x daily on average. She has decided that she wants to stop smoking and  plans to stop end of this wk.   Dyspnea:  No regular ex/ limited by feet hurting x 1.5 y Cough: none Sleeping: on cpap does fine, but when starts day is sob SABA use: never needs at rest  02: none  rec Plan A = Automatic = Trelegy one click each  - take smooth deep breaths x 2 to make sure you get it all  Rinse and gargle when done  The key is to stop smoking completely before smoking completely stops you! Plan B = Backup Only use your albuterol inhaler as a rescue medication Plan C = Crisis - only use your albuterol nebulizer if you first try Plan B  07/26/2019 eval at [facility] for atypical cp migratory / upper abd also  Listed as taking lisinopril but hasn't started it  Nl trop/ probnp / cxr  rec re start lisinopril See cards > scheduled for 07/30/19 and no cp since   07/28/2019  f/u ov/Laurie Mccoy re: GOLD III copd / can't tol trelegy or symbicort so just using saba hfa tid Chief Complaint  Patient presents with  . Follow-up  Dyspnea:  Still limited by feet/ able to walk  slow pace ok flat surfaces  MMRC2 = can't walk a nl pace on a flat grade s sob but does fine slow and flat  Cough:  none Sleeping: on cpap  SABA use: 3 x day hfa/ no neb needed  02: none  rec Don't take lisinopril  Micardis 40 mg one half daily up to 2 whole pills  daily goal is to keep bp under 135 and 90 on bottom  Please schedule a follow up visit in 3 months but call sooner if needed  - add: correctioin: f/u 1 m with pfts same day    07/03/2020  f/u ov/Laurie Mccoy re: GOLD III / still smoking, happy with  trelegy   until started with sevre cough x 7d > treleg seems to make it worse. Chief Complaint  Patient presents with  . Follow-up   Dyspnea:  Feet slow her down  But aslong as walk slowly not limited by   sob and can even go up a flight of steps  Cough: none until one week prior with light brown related to bad air tree surgery/ UC rx shot  Sleeping: on cpap recalled > f/u [place] planned SABA use:  normally not using but now starts each am with saba to "open her up for the day" before can use her trelegy. 02: none   No obvious day to day or daytime variability or assoc excess/ purulent sputum or mucus plugs or hemoptysis or cp or chest tightness, subjective wheeze or overt sinus or hb symptoms.   Sleeping  without nocturnal  or early am exacerbation  of respiratory  c/o's or need for noct saba. Also denies any obvious fluctuation of symptoms with weather or environmental changes or other aggravating or alleviating factors except as outlined above   No unusual exposure hx or h/o childhood pna/ asthma or knowledge of premature birth.  Current Allergies, Complete Past Medical History, Past Surgical History, Family History, and Social History were reviewed in [employer] record.  ROS  The following are not active complaints unless bolded Hoarseness, sore throat, dysphagia, dental problems, itching, sneezing,  nasal congestion or discharge of excess mucus or purulent secretions, ear ache,   fever, chills, sweats, unintended wt loss or wt gain, classically pleuritic or exertional cp,  orthopnea pnd or arm/hand swelling  or leg swelling, presyncope, palpitations, abdominal pain, anorexia, nausea, vomiting, diarrhea  or change in bowel habits or change in bladder habits, change in stools or change in urine, dysuria, hematuria,  rash, arthralgias, visual complaints, headache, numbness, weakness or ataxia or problems with walking or coordination,  change in mood or  memory.        Current Meds  Medication Sig  . albuterol (PROVENTIL) (2.5 MG/3ML) 0.083% nebulizer solution Take 3 mLs (2.5 mg total) by nebulization every 4 (four) hours as needed for wheezing or shortness of breath.  [name] albuterol (VENTOLIN HFA) 108 (90 Base) MCG/ACT inhaler Inhale 2 puffs into the lungs every 6 (six) hours as needed for wheezing or shortness of breath.  . ALPRAZolam (XANAX) 0.25 MG tablet Take 0.25  mg by mouth 2 (two) times daily as needed for anxiety.  [name] aspirin EC 81 MG tablet Take 81 mg by mouth daily.  . Fluticasone-Umeclidin-Vilant (TRELEGY ELLIPTA) 100-62.5-25 MCG/INH AEPB Inhale 1 puff into the lungs daily. Please remember to rinse your mouth!  . hydrochlorothiazide (HYDRODIURIL) 25 MG tablet Take 1 tablet (25 mg total) by mouth daily.  [name] loratadine (CLARITIN) 10 MG tablet Take 10 mg by mouth daily.  . nicotine (NICODERM CQ) 14 mg/24hr patch Place 1 patch (14 mg total) onto the skin daily.  . nicotine polacrilex (NICOTINE MINI)  4 MG lozenge Take 4 mg by mouth as needed for smoking cessation.  [name] telmisartan (MICARDIS) 20 MG tablet Take 1 tablet (20 mg total) by mouth daily.                              Objective:      amb elderly wf nad    07/03/2020       194   07/28/19 198 lb (89.8 kg)  05/10/19 197 lb 6.4 oz (89.5 kg)  04/12/19 195 lb 12.8 oz (88.8 kg)     Vital signs reviewed  07/03/2020  - Note at rest 02 sats  96% on RA    HEENT : pt wearing mask not removed for exam due to covid -19 concerns.     HEENT : pt wearing mask not removed for exam due to covid - 19 concerns.    NECK :  without JVD/Nodes/TM/ nl carotid upstrokes bilaterally   LUNGS: no acc muscle use,  Mild barrel  contour chest wall with bilateral  Distant bs s audible wheeze and  without cough on insp or exp maneuvers  and mild  Hyperresonant  to  percussion bilaterally     CV:  RRR  no s3 or murmur or increase in P2, and no edema   ABD:  soft and nontender with pos end  insp Hoover's  in the supine position. No bruits or organomegaly appreciated, bowel sounds nl  MS:   Nl gait/  ext warm without deformities, calf tenderness, cyanosis or clubbing No obvious joint restrictions   SKIN: warm and dry without lesions    NEURO:  alert, approp, nl sensorium with  no motor or cerebellar deficits apparent.          I personally reviewed images and agree with radiology impression as  follows:   Chest CT w/o contrast 02/15/20 1. Numerous bilateral ground-glass pulmonary nodules as above. Annual CT is recommended until 5 years of stability has been established. If persistent these nodules should be considered highly suspicious if the solid component of the nodule is 6 mm or greater in size and enlarging. This recommendation follows the consensus statement: Guidelines for Management of Incidental Pulmonary Nodules Detected on CT Images: From the Fleischner Society 2017; Radiology 2017; 284:228-243. 2. Aortic Atherosclerosis (ICD10-I70.0) and Emphysema (ICD10-J43.9). 3. 2.8 cm left lobe thyroid nodule. Recommend thyroid US if not previously performed. (Ref: J Am Coll Radiol. 2015 Feb;12(2): 143-50).     Assessment

## 2020-07-24 ENCOUNTER — Ambulatory Visit: Payer: Medicare Other | Admitting: Cardiovascular Disease

## 2020-07-25 ENCOUNTER — Telehealth: Payer: Self-pay | Admitting: Internal Medicine

## 2020-07-25 NOTE — Telephone Encounter (Signed)
Medication name and strength: Breztri Provider: [place]: [facility] Patient insurance ID: [id number] Phone: [phone] Fax: [phone]  Was the PA started on CMM?  yes If yes, please enter the Key: [id number] Timeframe for approval/denial: [employer] has not yet replied to your PA request. You may close this dialog, return to your dashboard, and perform other tasks.  To check for an update later, open this request again from your dashboard.  If [employer] has not replied to your request within 24 hours please contact [employer] at [phone]. For [facility] members, please call [phone].   Routing to [place] to f/u on

## 2020-07-26 NOTE — Telephone Encounter (Signed)
Checked CCM and med was approved:  Approvedtoday CaseId:[id number];Status:Approved;Review Type:Prior Auth;Coverage Start Date:06/25/2020;Coverage End Date:07/26/2021;  [place] and let them know that this  Nothing further needed

## 2020-08-08 ENCOUNTER — Other Ambulatory Visit: Payer: Self-pay | Admitting: Cardiovascular Disease

## 2020-08-23 ENCOUNTER — Telehealth: Payer: Self-pay | Admitting: Internal Medicine

## 2020-08-23 MED ORDER — BREZTRI AEROSPHERE 160-9-4.8 MCG/ACT IN AERO: 2.0000 | INHALATION_SPRAY | Freq: Two times a day (BID) | RESPIRATORY_TRACT | 5 refills | Status: DC

## 2020-08-23 NOTE — Telephone Encounter (Signed)
Rx has been sent in. 

## 2020-09-12 ENCOUNTER — Telehealth: Payer: Self-pay | Admitting: *Deleted

## 2020-09-12 DIAGNOSIS — G4733 Obstructive sleep apnea (adult) (pediatric): Secondary | ICD-10-CM

## 2020-09-12 NOTE — Telephone Encounter (Signed)
PER DR KELLY: order for supplies sent to [facility] via fax.

## 2020-10-07 DIAGNOSIS — E049 Nontoxic goiter, unspecified: Secondary | ICD-10-CM

## 2020-10-07 HISTORY — DX: Nontoxic goiter, unspecified: E04.9

## 2020-10-26 ENCOUNTER — Telehealth: Payer: Self-pay | Admitting: Cardiovascular Disease

## 2020-10-26 NOTE — Telephone Encounter (Signed)
Pt called in and just wanted Dr Jenetta DownerNori Riis to know that she has been exposed to covid for the last 5 days from her grandchildern.  She denies any symptoms.  She stated so far her and her husband were fine.  She just wanted to inform Dr Marisue Ivan of the [facility]

## 2020-10-26 NOTE — Telephone Encounter (Signed)
Left message for patient thank her for notifying the office.she will need to call back to reschedule  office visit with Dr Audie Box  . Will need to change to the second or third week in Feb 2022.

## 2020-11-03 ENCOUNTER — Ambulatory Visit: Payer: Medicare Other | Admitting: Cardiovascular Disease

## 2020-11-09 ENCOUNTER — Other Ambulatory Visit: Payer: Self-pay | Admitting: Cardiovascular Disease

## 2021-01-15 ENCOUNTER — Other Ambulatory Visit: Payer: Self-pay | Admitting: Cardiovascular Disease

## 2021-01-15 ENCOUNTER — Telehealth: Payer: Self-pay | Admitting: Cardiovascular Disease

## 2021-01-15 MED ORDER — TELMISARTAN 20 MG PO TABS: 20.0000 mg | ORAL_TABLET | Freq: Every day | ORAL | 1 refills | Status: DC

## 2021-01-15 NOTE — Telephone Encounter (Signed)
*  STAT* If patient is at the pharmacy, call can be transferred to refill team.   1. Which medications need to be refilled? (please list name of each medication and dose if known) telmisartan (MICARDIS) 20 MG tablet  2. Which pharmacy/location (including street and city if local pharmacy) is medication to be sent to? [place], [place] - [address]  3. Do they need a 30 day or 90 day supply? 90 day   Patient is out of medication. Patient is scheduled 05/02/2021. She is very upset that her refill was denied due to needing an appointment. 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 She states it is ridiculous that her refill was denied, when she cannot get an appointment until July. I offered to schedule something sooner with a PA and she refused. Informed patient she will be able to get enough medication to make it to her appointment. She requested I send a message to Dr. Audie Box in regards to her complaint.

## 2021-01-15 NOTE — Telephone Encounter (Signed)
Left message for pt to call back and set up appointment with Dr. Audie Box. Can send in medication refills once appointment established.

## 2021-01-15 NOTE — Telephone Encounter (Signed)
Spoke to patient she stated she needs Micardis refilled.Stated she has a appointment scheduled with Dr.O'Neal 05/02/21.Refill sent to pharmacy.

## 2021-02-23 ENCOUNTER — Telehealth: Payer: Self-pay | Admitting: Internal Medicine

## 2021-02-23 NOTE — Telephone Encounter (Signed)
I have transferred pt to [place]

## 2021-02-26 ENCOUNTER — Ambulatory Visit (HOSPITAL_COMMUNITY): Payer: Medicare Other

## 2021-02-28 ENCOUNTER — Telehealth: Payer: Self-pay | Admitting: Internal Medicine

## 2021-02-28 DIAGNOSIS — R918 Other nonspecific abnormal finding of lung field: Secondary | ICD-10-CM

## 2021-02-28 NOTE — Telephone Encounter (Signed)
I placed a new ct chest order  Pt aware  Nothing further needed

## 2021-04-03 ENCOUNTER — Telehealth: Payer: Self-pay | Admitting: Cardiovascular Disease

## 2021-04-03 ENCOUNTER — Ambulatory Visit
Admission: RE | Admit: 2021-04-03 | Discharge: 2021-04-03 | Disposition: A | Discharge status: Home / Self Care | Payer: Medicare Other | Source: Ambulatory Visit | Attending: Internal Medicine | Admitting: Internal Medicine

## 2021-04-03 DIAGNOSIS — R918 Other nonspecific abnormal finding of lung field: Secondary | ICD-10-CM

## 2021-04-03 NOTE — Telephone Encounter (Signed)
Pt c/o medication issue: 1. Name of Medication: Telmisartan 2. How are you currently taking this medication (dosage and times per day)? 1 tap a day 3. Are you having a reaction (difficulty breathing--STAT)?  SOB  4. What is your medication issue? Patient state that she is having some hip pain

## 2021-04-03 NOTE — Telephone Encounter (Signed)
Returned call to patient-patient reports that she use to be on lisinopril for her blood pressure but this was stopped by Dr. Melvyn Novas ("he didn't like it") and started on telmisartan.   Reports Dr. Audie Box then added a diuretic (HCTZ).   She reports she always feels dehydrated, has a dry mouth.  Reports she drinks 32-64 oz of water daily + her iced coffee.   She also reports BL hip pain for 1 month that comes and goes.   She is unsure if this is related to the medication.   Reports feeling fatigued, feeling "pooped" all the time.   She takes her telmisartan + hctz at night.   No BP readings to report but states it has been around 130/80s.      She is wondering if she could return to taking the lisinopril (looks like it was stopped d/t SOB + hoarseness).    Patient also request to cancel her appointment 7/27, she will be out of town until 7/30.  Will route to MD to review.

## 2021-04-05 ENCOUNTER — Other Ambulatory Visit: Payer: Self-pay | Admitting: Internal Medicine

## 2021-04-05 ENCOUNTER — Other Ambulatory Visit: Payer: Self-pay

## 2021-04-05 MED ORDER — LISINOPRIL 40 MG PO TABS: 40.0000 mg | ORAL_TABLET | Freq: Every day | ORAL | 1 refills | Status: DC

## 2021-04-05 NOTE — Telephone Encounter (Signed)
Left detailed message for Pt.  Pt was sent mychart message with Dr. Kathalene Frames advisement.  Requested Pt check her mychart message and respond with what she would like to do for blood pressure medication.  Advised to respond today or tomorrow so medications can be sent in to her pharmacy prior to the holiday weekend.  Await further needs.

## 2021-04-05 NOTE — Telephone Encounter (Signed)
PT is returning a call 

## 2021-04-05 NOTE — Telephone Encounter (Signed)
Updated medication list.  Patient made aware, medication sent to pharmacy.

## 2021-04-05 NOTE — Addendum Note (Signed)
Addended by: Caprice Beaver T on: 04/05/2021 04:44 PM   Modules accepted: Orders

## 2021-04-05 NOTE — Telephone Encounter (Signed)
Notified via mychart-  Patient requested appointment.

## 2021-04-05 NOTE — Telephone Encounter (Signed)
Patient is following up, requesting to speak with Dr. Kathalene Frames nurse regarding her medication issue from 04/03/21. She didn't go into detail, but she asked that someone please call her soon.

## 2021-05-02 ENCOUNTER — Ambulatory Visit: Payer: Medicare Other | Admitting: Cardiovascular Disease

## 2021-05-08 MED ORDER — MOMETASONE FURO-FORMOTEROL FUM 200-5 MCG/ACT IN AERO
2.0000 | INHALATION_SPRAY | Freq: Two times a day (BID) | RESPIRATORY_TRACT | 11 refills | Status: DC
Start: 2021-05-08 — End: 2021-05-30

## 2021-05-08 MED ORDER — MOMETASONE FURO-FORMOTEROL FUM 200-5 MCG/ACT IN AERO: 2.0000 | INHALATION_SPRAY | Freq: Two times a day (BID) | RESPIRATORY_TRACT | 11 refills | Status: DC

## 2021-05-08 NOTE — Addendum Note (Signed)
Addended by: Lorretta Harp on: 05/08/2021 05:04 PM   Modules accepted: Orders

## 2021-05-08 NOTE — Telephone Encounter (Signed)
Rec change to dulera 200 Take 2 puffs first thing in am and then another 2 puffs about 12 hours later x one month to see if we can isolate the components that may be contributing (or not ) to the side effects she's experiencing.  Note all inhalers will list basically the same reported side effects if you read the internet info on them so starting/ stopping is the only way to sort it out.

## 2021-05-08 NOTE — Telephone Encounter (Signed)
Dr. Melvyn Novas, please see mychart messages sent by pt and advise.

## 2021-05-09 MED ORDER — ADVAIR HFA 115-21 MCG/ACT IN AERO: 2.0000 | INHALATION_SPRAY | Freq: Two times a day (BID) | RESPIRATORY_TRACT | 12 refills | Status: DC

## 2021-05-09 NOTE — Addendum Note (Signed)
Addended by: Coralie Keens on: 05/09/2021 01:57 PM   Modules accepted: Orders

## 2021-05-09 NOTE — Telephone Encounter (Signed)
I received form from [facility]  It states that [facility] is not covered and needs PA  It listed covered alternatives:  Advair disk Advair hfa  Arnuity  Breo Flovent  Fluticasone furoate-vilanterol  Trelegy   Dr Melvyn Novas, please advise

## 2021-05-09 NOTE — Telephone Encounter (Signed)
Try advair hfa 115 Take 2 puffs first thing in am and then another 2 puffs about 12 hours later.

## 2021-05-09 NOTE — Telephone Encounter (Signed)
Dr. Melvyn Novas and Magda Paganini, just FYI to be on the lookout for PA on dulera. Thanks!  [facility] is waiting for the PA so they can fill the Perscription. They sent the request to you yesterday. Please let me know the status. Cyd Silence

## 2021-05-30 ENCOUNTER — Other Ambulatory Visit: Payer: Self-pay

## 2021-05-30 ENCOUNTER — Ambulatory Visit (INDEPENDENT_AMBULATORY_CARE_PROVIDER_SITE_OTHER): Payer: Medicare Other | Admitting: Pulmonary Disease

## 2021-05-30 ENCOUNTER — Encounter: Payer: Self-pay | Admitting: Pulmonary Disease

## 2021-05-30 ENCOUNTER — Telehealth: Payer: Self-pay | Admitting: Pulmonary Disease

## 2021-05-30 VITALS — BP 124/80 | HR 87 | Temp 98.0°F | Ht 67.0 in | Wt 198.8 lb

## 2021-05-30 DIAGNOSIS — Z716 Tobacco abuse counseling: Secondary | ICD-10-CM | POA: Diagnosis not present

## 2021-05-30 DIAGNOSIS — R918 Other nonspecific abnormal finding of lung field: Secondary | ICD-10-CM

## 2021-05-30 DIAGNOSIS — F17200 Nicotine dependence, unspecified, uncomplicated: Secondary | ICD-10-CM | POA: Diagnosis not present

## 2021-05-30 DIAGNOSIS — R911 Solitary pulmonary nodule: Secondary | ICD-10-CM | POA: Diagnosis not present

## 2021-05-30 NOTE — Patient Instructions (Signed)
Thank you for visiting Dr. Valeta Harms at [facility]. Today we recommend the following:  Orders Placed This Encounter  Procedures   Procedural/ Surgical Case Request: VIDEO BRONCHOSCOPY WITH ENDOBRONCHIAL NAVIGATION   CT Super D Chest Wo Contrast   Ambulatory referral to Pulmonology   Bronchoscopy on 06/12/2021  Return in about 5 weeks (around 07/04/2021) for with APP or Dr. Valeta Harms.    Please do your part to reduce the spread of COVID-19.

## 2021-05-30 NOTE — Telephone Encounter (Signed)
Pt informed of the following:   Super D CT from 6/28 will be converted and sent to [facility] per [facility]. Once rec'd we will send to [facility].  Covid test 9/2 between 8-3.  ENB sched 9/6 at 11:00am; 8:30am arrival time.  Case # [id number]

## 2021-05-30 NOTE — Progress Notes (Signed)
Synopsis: Referred in August 2022 for lung nodule by [name], NP  Subjective:   PATIENT ID: Laurie Mccoy GENDER: female DOB: 11-19-1940, MRN: [id number]  Chief Complaint  Patient presents with   Follow-up    Pt is being seen today due to a lung nodule.  Pt states she has been doing okay since last visit. Does have SOB with exertion.    This is a 80 year old female, asthma, hypertension, past medical history, family history of lung cancer mother never smoker, father lung cancer former smoker.  Presents today for evaluation of abnormal imaging.  Patient had a noncontrasted CT of the chest in June 2022.  He had slight interval increase and a irregular lobular shaped posterior right upper lobe groundglass subsolid lesion.  He had other smaller groundglass lesions that had resolved.  Also had a 2.6 cm left thyroid nodule.  Due to the increasing size in the lesion there was concern for underlying malignancy.  Patient was seen by Dr. Melvyn Novas.  Referred here today to discuss bronchoscopy and next steps.  OV 05/30/2021: Discussed images today in the office with patient.  We discussed all the pros and cons of watchful waiting versus tissue sampling.    Past Medical History:  Diagnosis Date   Asthma    Hypertension      Family History  Problem Relation Age of Onset   Lung cancer Mother        never smoker   Brain cancer Mother    Lung cancer Father        smoked   Stroke Brother      Past Surgical History:  Procedure Laterality Date   APPENDECTOMY     CHOLECYSTECTOMY     COLON RESECTION     hernia      Social History   Socioeconomic History   Marital status: Married    Spouse name: Not on file   Number of children: Not on file   Years of education: Not on file   Highest education level: Not on file  Occupational History   Not on file  Tobacco Use   Smoking status: Some Days    Packs/day: 0.50    Years: 46.00    Pack years: 23.00    Types: Cigarettes    Last  attempt to quit: 02/27/2020    Years since quitting: 1.2   Smokeless tobacco: Never   Tobacco comments:    no cigs in 3 days  Vaping Use   Vaping Use: Never used  Substance and Sexual Activity   Alcohol use: Not on file   Drug use: Not on file   Sexual activity: Not on file  Other Topics Concern   Not on file  Social History Narrative   Not on file   Social Determinants of Health   Financial Resource Strain: Not on file  Food Insecurity: Not on file  Transportation Needs: Not on file  Physical Activity: Not on file  Stress: Not on file  Social Connections: Not on file  Intimate Partner Violence: Not on file     Allergies  Allergen Reactions   Norvasc [Amlodipine]     Lower extremity swelling     Trelegy Ellipta [Fluticasone-Umeclidin-Vilant] Swelling    Swelling in legs and headache.     Outpatient Medications Prior to Visit  Medication Sig Dispense Refill   albuterol (PROVENTIL) (2.5 MG/3ML) 0.083% nebulizer solution Take 3 mLs (2.5 mg total) by nebulization every 4 (four) hours as needed for wheezing  or shortness of breath.     albuterol (VENTOLIN HFA) 108 (90 Base) MCG/ACT inhaler Inhale 2 puffs into the lungs every 6 (six) hours as needed for wheezing or shortness of breath. 18 g 1   ALPRAZolam (XANAX) 0.25 MG tablet Take 0.25 mg by mouth 2 (two) times daily as needed for anxiety.     aspirin EC 81 MG tablet Take 81 mg by mouth daily.     Budeson-Glycopyrrol-Formoterol (BREZTRI AEROSPHERE) 160-9-4.8 MCG/ACT AERO Inhale into the lungs.     Cyanocobalamin (B-12) 5000 MCG CAPS Take by mouth.     hydrochlorothiazide (HYDRODIURIL) 25 MG tablet Take by mouth.     loratadine (CLARITIN) 10 MG tablet Take 10 mg by mouth daily.     nicotine (NICODERM CQ) 14 mg/24hr patch Place 1 patch (14 mg total) onto the skin daily. 28 patch 6   nicotine polacrilex (COMMIT) 4 MG lozenge Take 4 mg by mouth as needed for smoking cessation.     telmisartan (MICARDIS) 20 MG tablet Take 20 mg  by mouth daily.     fluticasone-salmeterol (ADVAIR HFA) 115-21 MCG/ACT inhaler Inhale 2 puffs into the lungs 2 (two) times daily. 1 each 12   mometasone-formoterol (DULERA) 200-5 MCG/ACT AERO Inhale 2 puffs into the lungs in the morning and at bedtime. 13 g 11   azithromycin (ZITHROMAX) 250 MG tablet Take 2 on day one then 1 daily x 4 days 6 tablet 0   lisinopril (ZESTRIL) 40 MG tablet Take 1 tablet (40 mg total) by mouth daily. 90 tablet 1   predniSONE (DELTASONE) 10 MG tablet Take  4 each am x 2 days,   2 each am x 2 days,  1 each am x 2 days and stop 14 tablet 0   No facility-administered medications prior to visit.    Review of Systems  Constitutional:  Negative for chills, fever, malaise/fatigue and weight loss.  HENT:  Negative for hearing loss, sore throat and tinnitus.   Eyes:  Negative for blurred vision and double vision.  Respiratory:  Positive for cough and shortness of breath. Negative for hemoptysis, sputum production, wheezing and stridor.   Cardiovascular:  Negative for chest pain, palpitations, orthopnea, leg swelling and PND.  Gastrointestinal:  Negative for abdominal pain, constipation, diarrhea, heartburn, nausea and vomiting.  Genitourinary:  Negative for dysuria, hematuria and urgency.  Musculoskeletal:  Negative for joint pain and myalgias.  Skin:  Negative for itching and rash.  Neurological:  Negative for dizziness, tingling, weakness and headaches.  Endo/Heme/Allergies:  Negative for environmental allergies. Does not bruise/bleed easily.  Psychiatric/Behavioral:  Negative for depression. The patient is not nervous/anxious and does not have insomnia.   All other systems reviewed and are negative.   Objective:  Physical Exam Vitals reviewed.  Constitutional:      General: She is not in acute distress.    Appearance: She is well-developed. She is obese.  HENT:     Head: Normocephalic and atraumatic.  Eyes:     General: No scleral icterus.     Conjunctiva/sclera: Conjunctivae normal.     Pupils: Pupils are equal, round, and reactive to light.  Neck:     Vascular: No JVD.     Trachea: No tracheal deviation.  Cardiovascular:     Rate and Rhythm: Normal rate and regular rhythm.     Heart sounds: Normal heart sounds. No murmur heard. Pulmonary:     Effort: Pulmonary effort is normal. No tachypnea, accessory muscle usage or respiratory distress.  Breath sounds: No stridor. No wheezing, rhonchi or rales.  Abdominal:     General: Bowel sounds are normal. There is no distension.     Palpations: Abdomen is soft.     Tenderness: There is no abdominal tenderness.  Musculoskeletal:        General: No tenderness.     Cervical back: Neck supple.  Lymphadenopathy:     Cervical: No cervical adenopathy.  Skin:    General: Skin is warm and dry.     Capillary Refill: Capillary refill takes less than 2 seconds.     Findings: No rash.  Neurological:     Mental Status: She is alert and oriented to person, place, and time.  Psychiatric:        Behavior: Behavior normal.     Vitals:   05/30/21 0959  BP: 124/80  Pulse: 87  Temp: 98 F (36.7 C)  TempSrc: Oral  SpO2: 97%  Weight: 198 lb 12.8 oz (90.2 kg)  Height: 5\' 7"  (1.702 m)   97% on RA BMI Readings from Last 3 Encounters:  05/30/21 31.14 kg/m  07/03/20 30.38 kg/m  05/05/20 38.08 kg/m   Wt Readings from Last 3 Encounters:  05/30/21 198 lb 12.8 oz (90.2 kg)  07/03/20 194 lb (88 kg)  05/05/20 195 lb (88.5 kg)     CBC No results found for: WBC, RBC, HGB, HCT, PLT, MCV, MCH, MCHC, RDW, LYMPHSABS, MONOABS, EOSABS, BASOSABS   Chest Imaging: June 2022 CT chest: Patient with a right upper lobe groundglass subsolid lesion 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  Additionally there is a left upper lobe proximal cystic cavity defined as well. Both could represent malignancy. The patient's images have been independently reviewed by me.    Pulmonary Functions Testing  Results: No flowsheet data found.  FeNO:   Pathology:   Echocardiogram:   Heart Catheterization:     Assessment & Plan:     ICD-10-CM   1. Lung nodules  R91.8 Ambulatory referral to Pulmonology    Procedural/ Surgical Case Request: VIDEO BRONCHOSCOPY WITH ENDOBRONCHIAL NAVIGATION    CT Super D Chest Wo Contrast    2. Right upper lobe pulmonary nodule  R91.1     3. Ground glass opacity present on imaging of lung  R91.8       Discussion:  This is a 80 year old female, abnormal CT scan of the chest for follow-up of lung nodules.  She has a right upper lobe subsolid lesion that has slowly becoming more solid with areas of groundglass concerning for an indolent neoplasm, additionally has a cystic lesion with an associated nodular rim on the left upper lobe also possible malignancy.  Due to the bilateral nature and the fact that she is a current ongoing smoker would recommend tissue biopsy and sampling.  Plan: We discussed the risk benefits and alternatives of bronchoscopy attempt to bleeding and pneumothorax. We also counseled patient on smoking cessation today. We also discussed the utility of tissue sampling in the setting of whether or not this is a malignancy. We discussed diagnostic yield and probability of being able to establish a diagnosis using navigational bronchoscopy techniques. Patient is agreeable to proceed with tissue sampling. We have settled on the date of 06/12/2021.  We will work on trying to get her CT scan from June to plan the procedure.  If not she will need a repeat super D CT. She is on no anticoagulants.    Current Outpatient Medications:    albuterol (PROVENTIL) (2.5 MG/3ML) 0.083%  nebulizer solution, Take 3 mLs (2.5 mg total) by nebulization every 4 (four) hours as needed for wheezing or shortness of breath., Disp: , Rfl:    albuterol (VENTOLIN HFA) 108 (90 Base) MCG/ACT inhaler, Inhale 2 puffs into the lungs every 6 (six) hours as needed for wheezing  or shortness of breath., Disp: 18 g, Rfl: 1   ALPRAZolam (XANAX) 0.25 MG tablet, Take 0.25 mg by mouth 2 (two) times daily as needed for anxiety., Disp: , Rfl:    aspirin EC 81 MG tablet, Take 81 mg by mouth daily., Disp: , Rfl:    Budeson-Glycopyrrol-Formoterol (BREZTRI AEROSPHERE) 160-9-4.8 MCG/ACT AERO, Inhale into the lungs., Disp: , Rfl:    Cyanocobalamin (B-12) 5000 MCG CAPS, Take by mouth., Disp: , Rfl:    hydrochlorothiazide (HYDRODIURIL) 25 MG tablet, Take by mouth., Disp: , Rfl:    loratadine (CLARITIN) 10 MG tablet, Take 10 mg by mouth daily., Disp: , Rfl:    nicotine (NICODERM CQ) 14 mg/24hr patch, Place 1 patch (14 mg total) onto the skin daily., Disp: 28 patch, Rfl: 6   nicotine polacrilex (COMMIT) 4 MG lozenge, Take 4 mg by mouth as needed for smoking cessation., Disp: , Rfl:    telmisartan (MICARDIS) 20 MG tablet, Take 20 mg by mouth daily., Disp: , Rfl:   I spent 42 minutes dedicated to the care of this patient on the date of this encounter to include pre-visit review of records, face-to-face time with the patient discussing conditions above, post visit ordering of testing, clinical documentation with the electronic health record, making appropriate referrals as documented, and communicating necessary findings to members of the patients care team.   Garner Nash, [facility] 05/30/2021 10:15 AM

## 2021-05-30 NOTE — H&P (View-Only) (Signed)
Synopsis: Referred in August 2022 for lung nodule by [name], NP  Subjective:   PATIENT ID: Laurie Mccoy GENDER: female DOB: [birth year], MRN: [id number]  Chief Complaint  Patient presents with   Follow-up    Pt is being seen today due to a lung nodule.  Pt states she has been doing okay since last visit. Does have SOB with exertion.    This is a 80 year old female, asthma, hypertension, past medical history, family history of lung cancer mother never smoker, father lung cancer former smoker.  Presents today for evaluation of abnormal imaging.  Patient had a noncontrasted CT of the chest in June 2022.  He had slight interval increase and a irregular lobular shaped posterior right upper lobe groundglass subsolid lesion.  He had other smaller groundglass lesions that had resolved.  Also had a 2.6 cm left thyroid nodule.  Due to the increasing size in the lesion there was concern for underlying malignancy.  Patient was seen by Dr. Melvyn Novas.  Referred here today to discuss bronchoscopy and next steps.  OV 05/30/2021: Discussed images today in the office with patient.  We discussed all the pros and cons of watchful waiting versus tissue sampling.    Past Medical History:  Diagnosis Date   Asthma    Hypertension      Family History  Problem Relation Age of Onset   Lung cancer Mother        never smoker   Brain cancer Mother    Lung cancer Father        smoked   Stroke Brother      Past Surgical History:  Procedure Laterality Date   APPENDECTOMY     CHOLECYSTECTOMY     COLON RESECTION     hernia      Social History   Socioeconomic History   Marital status: Married    Spouse name: Not on file   Number of children: Not on file   Years of education: Not on file   Highest education level: Not on file  Occupational History   Not on file  Tobacco Use   Smoking status: Some Days    Packs/day: 0.50    Years: 46.00    Pack years: 23.00    Types: Cigarettes    Last  attempt to quit: 02/27/2020    Years since quitting: 1.2   Smokeless tobacco: Never   Tobacco comments:    no cigs in 3 days  Vaping Use   Vaping Use: Never used  Substance and Sexual Activity   Alcohol use: Not on file   Drug use: Not on file   Sexual activity: Not on file  Other Topics Concern   Not on file  Social History Narrative   Not on file   Social Determinants of Health   Financial Resource Strain: Not on file  Food Insecurity: Not on file  Transportation Needs: Not on file  Physical Activity: Not on file  Stress: Not on file  Social Connections: Not on file  Intimate Partner Violence: Not on file     Allergies  Allergen Reactions   Norvasc [Amlodipine]     Lower extremity swelling     Trelegy Ellipta [Fluticasone-Umeclidin-Vilant] Swelling    Swelling in legs and headache.     Outpatient Medications Prior to Visit  Medication Sig Dispense Refill   albuterol (PROVENTIL) (2.5 MG/3ML) 0.083% nebulizer solution Take 3 mLs (2.5 mg total) by nebulization every 4 (four) hours as needed for wheezing  or shortness of breath.     albuterol (VENTOLIN HFA) 108 (90 Base) MCG/ACT inhaler Inhale 2 puffs into the lungs every 6 (six) hours as needed for wheezing or shortness of breath. 18 g 1   ALPRAZolam (XANAX) 0.25 MG tablet Take 0.25 mg by mouth 2 (two) times daily as needed for anxiety.     aspirin EC 81 MG tablet Take 81 mg by mouth daily.     Budeson-Glycopyrrol-Formoterol (BREZTRI AEROSPHERE) 160-9-4.8 MCG/ACT AERO Inhale into the lungs.     Cyanocobalamin (B-12) 5000 MCG CAPS Take by mouth.     hydrochlorothiazide (HYDRODIURIL) 25 MG tablet Take by mouth.     loratadine (CLARITIN) 10 MG tablet Take 10 mg by mouth daily.     nicotine (NICODERM CQ) 14 mg/24hr patch Place 1 patch (14 mg total) onto the skin daily. 28 patch 6   nicotine polacrilex (COMMIT) 4 MG lozenge Take 4 mg by mouth as needed for smoking cessation.     telmisartan (MICARDIS) 20 MG tablet Take 20 mg  by mouth daily.     fluticasone-salmeterol (ADVAIR HFA) 115-21 MCG/ACT inhaler Inhale 2 puffs into the lungs 2 (two) times daily. 1 each 12   mometasone-formoterol (DULERA) 200-5 MCG/ACT AERO Inhale 2 puffs into the lungs in the morning and at bedtime. 13 g 11   azithromycin (ZITHROMAX) 250 MG tablet Take 2 on day one then 1 daily x 4 days 6 tablet 0   lisinopril (ZESTRIL) 40 MG tablet Take 1 tablet (40 mg total) by mouth daily. 90 tablet 1   predniSONE (DELTASONE) 10 MG tablet Take  4 each am x 2 days,   2 each am x 2 days,  1 each am x 2 days and stop 14 tablet 0   No facility-administered medications prior to visit.    Review of Systems  Constitutional:  Negative for chills, fever, malaise/fatigue and weight loss.  HENT:  Negative for hearing loss, sore throat and tinnitus.   Eyes:  Negative for blurred vision and double vision.  Respiratory:  Positive for cough and shortness of breath. Negative for hemoptysis, sputum production, wheezing and stridor.   Cardiovascular:  Negative for chest pain, palpitations, orthopnea, leg swelling and PND.  Gastrointestinal:  Negative for abdominal pain, constipation, diarrhea, heartburn, nausea and vomiting.  Genitourinary:  Negative for dysuria, hematuria and urgency.  Musculoskeletal:  Negative for joint pain and myalgias.  Skin:  Negative for itching and rash.  Neurological:  Negative for dizziness, tingling, weakness and headaches.  Endo/Heme/Allergies:  Negative for environmental allergies. Does not bruise/bleed easily.  Psychiatric/Behavioral:  Negative for depression. The patient is not nervous/anxious and does not have insomnia.   All other systems reviewed and are negative.   Objective:  Physical Exam Vitals reviewed.  Constitutional:      General: She is not in acute distress.    Appearance: She is well-developed. She is obese.  HENT:     Head: Normocephalic and atraumatic.  Eyes:     General: No scleral icterus.     Conjunctiva/sclera: Conjunctivae normal.     Pupils: Pupils are equal, round, and reactive to light.  Neck:     Vascular: No JVD.     Trachea: No tracheal deviation.  Cardiovascular:     Rate and Rhythm: Normal rate and regular rhythm.     Heart sounds: Normal heart sounds. No murmur heard. Pulmonary:     Effort: Pulmonary effort is normal. No tachypnea, accessory muscle usage or respiratory distress.  Breath sounds: No stridor. No wheezing, rhonchi or rales.  Abdominal:     General: Bowel sounds are normal. There is no distension.     Palpations: Abdomen is soft.     Tenderness: There is no abdominal tenderness.  Musculoskeletal:        General: No tenderness.     Cervical back: Neck supple.  Lymphadenopathy:     Cervical: No cervical adenopathy.  Skin:    General: Skin is warm and dry.     Capillary Refill: Capillary refill takes less than 2 seconds.     Findings: No rash.  Neurological:     Mental Status: She is alert and oriented to person, place, and time.  Psychiatric:        Behavior: Behavior normal.     Vitals:   05/30/21 0959  BP: 124/80  Pulse: 87  Temp: 98 F (36.7 C)  TempSrc: Oral  SpO2: 97%  Weight: 198 lb 12.8 oz (90.2 kg)  Height: 5\' 7"  (1.702 m)   97% on RA BMI Readings from Last 3 Encounters:  05/30/21 31.14 kg/m  07/03/20 30.38 kg/m  05/05/20 38.08 kg/m   Wt Readings from Last 3 Encounters:  05/30/21 198 lb 12.8 oz (90.2 kg)  07/03/20 194 lb (88 kg)  05/05/20 195 lb (88.5 kg)     CBC No results found for: WBC, RBC, HGB, HCT, PLT, MCV, MCH, MCHC, RDW, LYMPHSABS, MONOABS, EOSABS, BASOSABS   Chest Imaging: June 2022 CT chest: Patient with a right upper lobe groundglass subsolid lesion 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  Additionally there is a left upper lobe proximal cystic cavity defined as well. Both could represent malignancy. The patient's images have been independently reviewed by me.    Pulmonary Functions Testing  Results: No flowsheet data found.  FeNO:   Pathology:   Echocardiogram:   Heart Catheterization:     Assessment & Plan:     ICD-10-CM   1. Lung nodules  R91.8 Ambulatory referral to Pulmonology    Procedural/ Surgical Case Request: VIDEO BRONCHOSCOPY WITH ENDOBRONCHIAL NAVIGATION    CT Super D Chest Wo Contrast    2. Right upper lobe pulmonary nodule  R91.1     3. Ground glass opacity present on imaging of lung  R91.8       Discussion:  This is a 80 year old female, abnormal CT scan of the chest for follow-up of lung nodules.  She has a right upper lobe subsolid lesion that has slowly becoming more solid with areas of groundglass concerning for an indolent neoplasm, additionally has a cystic lesion with an associated nodular rim on the left upper lobe also possible malignancy.  Due to the bilateral nature and the fact that she is a current ongoing smoker would recommend tissue biopsy and sampling.  Plan: We discussed the risk benefits and alternatives of bronchoscopy attempt to bleeding and pneumothorax. We also counseled patient on smoking cessation today. We also discussed the utility of tissue sampling in the setting of whether or not this is a malignancy. We discussed diagnostic yield and probability of being able to establish a diagnosis using navigational bronchoscopy techniques. Patient is agreeable to proceed with tissue sampling. We have settled on the date of 06/12/2021.  We will work on trying to get her CT scan from June to plan the procedure.  If not she will need a repeat super D CT. She is on no anticoagulants.    Current Outpatient Medications:    albuterol (PROVENTIL) (2.5 MG/3ML) 0.083%  nebulizer solution, Take 3 mLs (2.5 mg total) by nebulization every 4 (four) hours as needed for wheezing or shortness of breath., Disp: , Rfl:    albuterol (VENTOLIN HFA) 108 (90 Base) MCG/ACT inhaler, Inhale 2 puffs into the lungs every 6 (six) hours as needed for wheezing  or shortness of breath., Disp: 18 g, Rfl: 1   ALPRAZolam (XANAX) 0.25 MG tablet, Take 0.25 mg by mouth 2 (two) times daily as needed for anxiety., Disp: , Rfl:    aspirin EC 81 MG tablet, Take 81 mg by mouth daily., Disp: , Rfl:    Budeson-Glycopyrrol-Formoterol (BREZTRI AEROSPHERE) 160-9-4.8 MCG/ACT AERO, Inhale into the lungs., Disp: , Rfl:    Cyanocobalamin (B-12) 5000 MCG CAPS, Take by mouth., Disp: , Rfl:    hydrochlorothiazide (HYDRODIURIL) 25 MG tablet, Take by mouth., Disp: , Rfl:    loratadine (CLARITIN) 10 MG tablet, Take 10 mg by mouth daily., Disp: , Rfl:    nicotine (NICODERM CQ) 14 mg/24hr patch, Place 1 patch (14 mg total) onto the skin daily., Disp: 28 patch, Rfl: 6   nicotine polacrilex (COMMIT) 4 MG lozenge, Take 4 mg by mouth as needed for smoking cessation., Disp: , Rfl:    telmisartan (MICARDIS) 20 MG tablet, Take 20 mg by mouth daily., Disp: , Rfl:   I spent 42 minutes dedicated to the care of this patient on the date of this encounter to include pre-visit review of records, face-to-face time with the patient discussing conditions above, post visit ordering of testing, clinical documentation with the electronic health record, making appropriate referrals as documented, and communicating necessary findings to members of the patients care team.   Garner Nash, [facility] 05/30/2021 10:15 AM

## 2021-05-31 ENCOUNTER — Encounter (HOSPITAL_BASED_OUTPATIENT_CLINIC_OR_DEPARTMENT_OTHER): Payer: Self-pay

## 2021-05-31 NOTE — Telephone Encounter (Signed)
Rec'd Super D Disk. Interofficed to [facility] END[facility].

## 2021-06-02 ENCOUNTER — Other Ambulatory Visit: Payer: Medicare Other

## 2021-06-07 ENCOUNTER — Encounter (HOSPITAL_COMMUNITY): Payer: Self-pay | Admitting: Pulmonary Disease

## 2021-06-07 ENCOUNTER — Other Ambulatory Visit: Payer: Self-pay

## 2021-06-07 NOTE — Progress Notes (Signed)
PCP - Bradly Bienenstock, NP Cardiologist - Dr. Farris Has EKG - DOS Chest x-ray -  ECHO -  Cardiac Cath -  CPAP -   ERAS Protcol -   COVID TEST- 06/08/21   Anesthesia review: n/a  -------------  SDW INSTRUCTIONS:  Your procedure is scheduled on Tuesday 9/6. Please report to [facility] Main Entrance "A" at 0830 A.M., and check in at the Admitting office. Call this number if you have problems the morning of surgery: [phone]   Remember: Do not eat or drink after midnight the night before your surgery   Medications to take morning of surgery with a sip of water include: acetaminophen (TYLENOL) if needed Inhaler and nebulizer --- Please bring all inhalers with you the day of surgery.  ALPRAZolam Duanne Moron) if needed loratadine (CLARITIN) if needed   As of today, STOP taking any Aspirin (unless otherwise instructed by your surgeon), Aleve, Naproxen, Ibuprofen, Motrin, Advil, Goody's, BC's, all herbal medications, fish oil, and all vitamins.    The Morning of Surgery Do not wear jewelry, make-up or nail polish. Do not wear lotions, powders, or perfumes, or deodorant Do not shave 48 hours prior to surgery.   Men may shave face and neck. Do not bring valuables to the hospital. [facility] is not responsible for any belongings or valuables.  If you are a smoker, DO NOT Smoke 24 hours prior to surgery  If you wear a CPAP at night please bring your mask the morning of surgery   Remember that you must have someone to transport you home after your surgery, and remain with you for 24 hours if you are discharged the same day.  Please bring cases for contacts, glasses, hearing aids, dentures or bridgework because it cannot be worn into surgery.   Patients discharged the day of surgery will not be allowed to drive home.   Please shower the NIGHT BEFORE/MORNING OF SURGERY (use antibacterial soap like DIAL soap if possible). Wear comfortable clothes the morning of surgery. Oral Hygiene is also  important to reduce your risk of infection.  Remember - BRUSH YOUR TEETH THE MORNING OF SURGERY WITH YOUR REGULAR TOOTHPASTE  Patient denies shortness of breath, fever, cough and chest pain.

## 2021-06-08 ENCOUNTER — Other Ambulatory Visit: Payer: Self-pay | Admitting: Pulmonary Disease

## 2021-06-09 LAB — SARS CORONAVIRUS 2 (TAT 6-24 HRS): SARS Coronavirus 2: NEGATIVE

## 2021-06-12 ENCOUNTER — Ambulatory Visit (HOSPITAL_COMMUNITY): Payer: Medicare Other | Admitting: Anesthesiology

## 2021-06-12 ENCOUNTER — Ambulatory Visit (HOSPITAL_COMMUNITY): Payer: Medicare Other

## 2021-06-12 ENCOUNTER — Encounter (HOSPITAL_COMMUNITY)
Admission: RE | Disposition: A | Discharge status: Home / Self Care | Payer: Self-pay | Source: Home / Self Care | Attending: Pulmonary Disease

## 2021-06-12 ENCOUNTER — Encounter (HOSPITAL_COMMUNITY): Payer: Self-pay | Admitting: Pulmonary Disease

## 2021-06-12 ENCOUNTER — Ambulatory Visit (HOSPITAL_COMMUNITY)
Admission: RE | Admit: 2021-06-12 | Discharge: 2021-06-12 | Disposition: A | Discharge status: Home / Self Care | Payer: Medicare Other | Attending: Pulmonary Disease | Admitting: Pulmonary Disease

## 2021-06-12 DIAGNOSIS — Z888 Allergy status to other drugs, medicaments and biological substances status: Secondary | ICD-10-CM | POA: Insufficient documentation

## 2021-06-12 DIAGNOSIS — J45909 Unspecified asthma, uncomplicated: Secondary | ICD-10-CM | POA: Diagnosis not present

## 2021-06-12 DIAGNOSIS — Z7982 Long term (current) use of aspirin: Secondary | ICD-10-CM | POA: Diagnosis not present

## 2021-06-12 DIAGNOSIS — Z7952 Long term (current) use of systemic steroids: Secondary | ICD-10-CM | POA: Diagnosis not present

## 2021-06-12 DIAGNOSIS — E041 Nontoxic single thyroid nodule: Secondary | ICD-10-CM | POA: Diagnosis not present

## 2021-06-12 DIAGNOSIS — Z801 Family history of malignant neoplasm of trachea, bronchus and lung: Secondary | ICD-10-CM | POA: Diagnosis not present

## 2021-06-12 DIAGNOSIS — Z79899 Other long term (current) drug therapy: Secondary | ICD-10-CM | POA: Diagnosis not present

## 2021-06-12 DIAGNOSIS — I10 Essential (primary) hypertension: Secondary | ICD-10-CM | POA: Diagnosis not present

## 2021-06-12 DIAGNOSIS — R918 Other nonspecific abnormal finding of lung field: Secondary | ICD-10-CM | POA: Insufficient documentation

## 2021-06-12 DIAGNOSIS — F17210 Nicotine dependence, cigarettes, uncomplicated: Secondary | ICD-10-CM | POA: Diagnosis not present

## 2021-06-12 DIAGNOSIS — Z419 Encounter for procedure for purposes other than remedying health state, unspecified: Secondary | ICD-10-CM

## 2021-06-12 DIAGNOSIS — Z7951 Long term (current) use of inhaled steroids: Secondary | ICD-10-CM | POA: Insufficient documentation

## 2021-06-12 DIAGNOSIS — Z9889 Other specified postprocedural states: Secondary | ICD-10-CM

## 2021-06-12 HISTORY — DX: Sleep apnea, unspecified: G47.30

## 2021-06-12 HISTORY — PX: BRONCHIAL BRUSHINGS: SHX5108

## 2021-06-12 HISTORY — PX: BRONCHIAL BIOPSY: SHX5109

## 2021-06-12 HISTORY — PX: VIDEO BRONCHOSCOPY WITH RADIAL ENDOBRONCHIAL ULTRASOUND: SHX6849

## 2021-06-12 HISTORY — PX: VIDEO BRONCHOSCOPY WITH ENDOBRONCHIAL NAVIGATION: SHX6175

## 2021-06-12 HISTORY — PX: BRONCHIAL WASHINGS: SHX5105

## 2021-06-12 HISTORY — PX: FIDUCIAL MARKER PLACEMENT: SHX6858

## 2021-06-12 LAB — BASIC METABOLIC PANEL
Anion gap: 6 (ref 5–15)
BUN: 9 mg/dL (ref 8–23)
CO2: 23 mmol/L (ref 22–32)
Calcium: 8.7 mg/dL — ABNORMAL LOW (ref 8.9–10.3)
Chloride: 107 mmol/L (ref 98–111)
Creatinine, Ser: 0.56 mg/dL (ref 0.44–1.00)
GFR, Estimated: >60 mL/min (ref >60)
Glucose, Bld: 115 mg/dL — ABNORMAL HIGH (ref 70–99)
Potassium: 4 mmol/L (ref 3.5–5.1)
Sodium: 136 mmol/L (ref 135–145)

## 2021-06-12 LAB — CBC
HCT: 42.2 % (ref 36.0–46.0)
Hemoglobin: 13.7 g/dL (ref 12.0–15.0)
MCH: 32 pg (ref 26.0–34.0)
MCHC: 32.5 g/dL (ref 30.0–36.0)
MCV: 98.6 fL (ref 80.0–100.0)
Platelets: 246 10*3/uL (ref 150–400)
RBC: 4.28 MIL/uL (ref 3.87–5.11)
RDW: 12.4 % (ref 11.5–15.5)
WBC: 7.2 10*3/uL (ref 4.0–10.5)
nRBC: 0 % (ref 0.0–0.2)

## 2021-06-12 SURGERY — VIDEO BRONCHOSCOPY WITH ENDOBRONCHIAL NAVIGATION
Anesthesia: General | Laterality: Bilateral

## 2021-06-12 MED ORDER — FENTANYL CITRATE (PF) 100 MCG/2ML IJ SOLN
INTRAMUSCULAR | Status: DC | PRN
  Administered 2021-06-12 (×2): 50 ug via INTRAVENOUS

## 2021-06-12 MED ORDER — ONDANSETRON HCL 4 MG/2ML IJ SOLN: 4.0000 mg | Freq: Once | INTRAMUSCULAR | Status: DC | PRN

## 2021-06-12 MED ORDER — ALBUTEROL SULFATE HFA 108 (90 BASE) MCG/ACT IN AERS
2.0000 | INHALATION_SPRAY | RESPIRATORY_TRACT | Status: AC
  Administered 2021-06-12: 2 via RESPIRATORY_TRACT
  Filled 2021-06-12: qty 6.7

## 2021-06-12 MED ORDER — ROCURONIUM BROMIDE 10 MG/ML (PF) SYRINGE
PREFILLED_SYRINGE | INTRAVENOUS | Status: DC | PRN
  Administered 2021-06-12: 70 mg via INTRAVENOUS

## 2021-06-12 MED ORDER — ONDANSETRON HCL 4 MG/2ML IJ SOLN
INTRAMUSCULAR | Status: DC | PRN
  Administered 2021-06-12: 4 mg via INTRAVENOUS

## 2021-06-12 MED ORDER — PROPOFOL 10 MG/ML IV BOLUS
INTRAVENOUS | Status: DC | PRN
  Administered 2021-06-12: 30 mg via INTRAVENOUS
  Administered 2021-06-12: 50 mg via INTRAVENOUS
  Administered 2021-06-12: 150 mg via INTRAVENOUS

## 2021-06-12 MED ORDER — OXYCODONE HCL 5 MG/5ML PO SOLN
5.0000 mg | Freq: Once | ORAL | Status: DC | PRN
Start: 2021-06-12 — End: 2021-06-12
  Filled 2021-06-12: qty 5

## 2021-06-12 MED ORDER — CHLORHEXIDINE GLUCONATE 0.12 % MT SOLN
OROMUCOSAL | Status: AC
  Administered 2021-06-12: 15 mL
  Filled 2021-06-12: qty 15

## 2021-06-12 MED ORDER — LORAZEPAM 2 MG/ML IJ SOLN
INTRAMUSCULAR | Status: AC
  Filled 2021-06-12: qty 1

## 2021-06-12 MED ORDER — DEXAMETHASONE SODIUM PHOSPHATE 4 MG/ML IJ SOLN
INTRAMUSCULAR | Status: DC | PRN
  Administered 2021-06-12: 10 mg via INTRAVENOUS

## 2021-06-12 MED ORDER — LORAZEPAM 2 MG/ML IJ SOLN
1.0000 mg | Freq: Once | INTRAMUSCULAR | Status: AC
  Administered 2021-06-12: 1 mg via INTRAVENOUS

## 2021-06-12 MED ORDER — FENTANYL CITRATE (PF) 100 MCG/2ML IJ SOLN: 25.0000 ug | INTRAMUSCULAR | Status: DC | PRN

## 2021-06-12 MED ORDER — LIDOCAINE 2% (20 MG/ML) 5 ML SYRINGE
INTRAMUSCULAR | Status: DC | PRN
  Administered 2021-06-12: 60 mg via INTRAVENOUS

## 2021-06-12 MED ORDER — OXYCODONE HCL 5 MG PO TABS
5.0000 mg | ORAL_TABLET | Freq: Once | ORAL | Status: DC | PRN
  Filled 2021-06-12: qty 1

## 2021-06-12 MED ORDER — LACTATED RINGERS IV SOLN: INTRAVENOUS | Status: DC

## 2021-06-12 SURGICAL SUPPLY — 45 items
ADAPTER BRONCH F/PENTAX (ADAPTER) ×3 IMPLANT
ADAPTER VALVE BIOPSY EBUS (MISCELLANEOUS) IMPLANT
ADPTR VALVE BIOPSY EBUS (MISCELLANEOUS)
BRUSH CYTOL CELLEBRITY 1.5X140 (MISCELLANEOUS) ×3 IMPLANT
BRUSH SUPERTRAX BIOPSY (INSTRUMENTS) IMPLANT
BRUSH SUPERTRAX NDL-TIP CYTO (INSTRUMENTS) ×3 IMPLANT
CANISTER SUCT 3000ML PPV (MISCELLANEOUS) ×3 IMPLANT
CHANNEL WORK EXTEND EDGE 180 (KITS) IMPLANT
CHANNEL WORK EXTEND EDGE 45 (KITS) IMPLANT
CHANNEL WORK EXTEND EDGE 90 (KITS) IMPLANT
CONT SPEC 4OZ CLIKSEAL STRL BL (MISCELLANEOUS) ×3 IMPLANT
COVER BACK TABLE 60X90IN (DRAPES) ×3 IMPLANT
FILTER STRAW FLUID ASPIR (MISCELLANEOUS) IMPLANT
FORCEPS BIOP SUPERTRX PREMAR (INSTRUMENTS) ×3 IMPLANT
GAUZE SPONGE 4X4 12PLY STRL (GAUZE/BANDAGES/DRESSINGS) ×3 IMPLANT
GLOVE SURG SS PI 7.5 STRL IVOR (GLOVE) ×6 IMPLANT
GOWN STRL REUS W/ TWL LRG LVL3 (GOWN DISPOSABLE) ×4 IMPLANT
GOWN STRL REUS W/TWL LRG LVL3 (GOWN DISPOSABLE) ×2
KIT CLEAN ENDO COMPLIANCE (KITS) ×3 IMPLANT
KIT LOCATABLE GUIDE (CANNULA) IMPLANT
KIT MARKER FIDUCIAL DELIVERY (KITS) IMPLANT
KIT PROCEDURE EDGE 180 (KITS) IMPLANT
KIT PROCEDURE EDGE 45 (KITS) IMPLANT
KIT PROCEDURE EDGE 90 (KITS) IMPLANT
KIT TURNOVER KIT B (KITS) ×3 IMPLANT
MARKER SKIN DUAL TIP RULER LAB (MISCELLANEOUS) ×3 IMPLANT
NEEDLE SUPERTRX PREMARK BIOPSY (NEEDLE) ×3 IMPLANT
NS IRRIG 1000ML POUR BTL (IV SOLUTION) ×3 IMPLANT
OIL SILICONE PENTAX (PARTS (SERVICE/REPAIRS)) ×3 IMPLANT
PAD ARMBOARD 7.5X6 YLW CONV (MISCELLANEOUS) ×6 IMPLANT
PATCHES PATIENT (LABEL) ×9 IMPLANT
SOL ANTI FOG 6CC (MISCELLANEOUS) ×2 IMPLANT
SOLUTION ANTI FOG 6CC (MISCELLANEOUS) ×1
SYR 20CC LL (SYRINGE) ×3 IMPLANT
SYR 20ML ECCENTRIC (SYRINGE) ×3 IMPLANT
SYR 50ML SLIP (SYRINGE) ×3 IMPLANT
SuperLock fiducial marker ×3 IMPLANT
TOWEL OR 17X24 6PK STRL BLUE (TOWEL DISPOSABLE) ×3 IMPLANT
TRAP SPECIMEN MUCOUS 40CC (MISCELLANEOUS) IMPLANT
TUBE CONNECTING 20X1/4 (TUBING) ×3 IMPLANT
UNDERPAD 30X30 (UNDERPADS AND DIAPERS) ×3 IMPLANT
VALVE BIOPSY  SINGLE USE (MISCELLANEOUS) ×1
VALVE BIOPSY SINGLE USE (MISCELLANEOUS) ×2 IMPLANT
VALVE SUCTION BRONCHIO DISP (MISCELLANEOUS) ×3 IMPLANT
WATER STERILE IRR 1000ML POUR (IV SOLUTION) ×3 IMPLANT

## 2021-06-12 NOTE — Anesthesia Preprocedure Evaluation (Addendum)
Anesthesia Evaluation  Patient identified by MRN, date of birth, ID band Patient awake    Reviewed: Allergy & Precautions, NPO status , Patient's Chart, lab work & pertinent test results  Airway Mallampati: III  TM Distance: >3 FB Neck ROM: Full    Dental no notable dental hx.    Pulmonary asthma , sleep apnea , COPD, Current Smoker,    Pulmonary exam normal breath sounds clear to auscultation       Cardiovascular hypertension, Normal cardiovascular exam Rhythm:Regular Rate:Normal     Neuro/Psych negative neurological ROS  negative psych ROS   GI/Hepatic negative GI ROS, Neg liver ROS,   Endo/Other  Obesity  Renal/GU negative Renal ROS  negative genitourinary   Musculoskeletal negative musculoskeletal ROS (+)   Abdominal   Peds negative pediatric ROS (+)  Hematology negative hematology ROS (+)   Anesthesia Other Findings Patient anxious and tearful + cough and congestion  Reproductive/Obstetrics negative OB ROS                          Anesthesia Physical Anesthesia Plan  ASA: 3  Anesthesia Plan: General   Post-op Pain Management:    Induction: Intravenous  PONV Risk Score and Plan: 2  Airway Management Planned: Oral ETT  Additional Equipment: None  Intra-op Plan:   Post-operative Plan: Extubation in OR  Informed Consent: I have reviewed the patients History and Physical, chart, labs and discussed the procedure including the risks, benefits and alternatives for the proposed anesthesia with the patient or authorized representative who has indicated his/her understanding and acceptance.     Dental advisory given  Plan Discussed with: CRNA and Anesthesiologist  Anesthesia Plan Comments:         Anesthesia Quick Evaluation

## 2021-06-12 NOTE — Discharge Instructions (Signed)
Flexible Bronchoscopy, Care After This sheet gives you information about how to care for yourself after your test. Your doctor may also give you more specific instructions. If you have problems or questions, contact your doctor. Follow these instructions at home: Eating and drinking Do not eat or drink anything (not even water) for 2 hours after your test, or until your numbing medicine (local anesthetic) wears off. When your numbness is gone and your cough and gag reflexes have come back, you may: Eat only soft foods. Slowly drink liquids. The day after the test, go back to your normal diet. Driving Do not drive for 24 hours if you were given a medicine to help you relax (sedative). Do not drive or use heavy machinery while taking prescription pain medicine. General instructions  Take over-the-counter and prescription medicines only as told by your doctor. Return to your normal activities as told. Ask what activities are safe for you. Do not use any products that have nicotine or tobacco in them. This includes cigarettes and e-cigarettes. If you need help quitting, ask your doctor. Keep all follow-up visits as told by your doctor. This is important. It is very important if you had a tissue sample (biopsy) taken. Get help right away if: You have shortness of breath that gets worse. You get light-headed. You feel like you are going to pass out (faint). You have chest pain. You cough up: More than a little blood. More blood than before. Summary Do not eat or drink anything (not even water) for 2 hours after your test, or until your numbing medicine wears off. Do not use cigarettes. Do not use e-cigarettes. Get help right away if you have chest pain.  This information is not intended to replace advice given to you by your health care provider. Make sure you discuss any questions you have with your health care provider. Document Released: 07/21/2009 Document Revised: 09/05/2017 Document  Reviewed: 10/11/2016 Elsevier Patient Education  2020 [employer].

## 2021-06-12 NOTE — Interval H&P Note (Signed)
History and Physical Interval Note:  06/12/2021 11:11 AM  Laurie Mccoy  has presented today for surgery, with the diagnosis of Lung nodules.  The various methods of treatment have been discussed with the patient and family. After consideration of risks, benefits and other options for treatment, the patient has consented to  Procedure(s) with comments: [place] (Bilateral) - ION as a surgical intervention.  The patient's history has been reviewed, patient examined, no change in status, stable for surgery.  I have reviewed the patient's chart and labs.  Questions were answered to the patient's satisfaction.     [place]

## 2021-06-12 NOTE — Op Note (Signed)
Video Bronchoscopy with Robotic Assisted Bronchoscopic Navigation   Date of Operation: 06/12/2021   Pre-op Diagnosis: Bilateral lung nodules  Post-op Diagnosis: Bilateral lung nodules  Surgeon: Garner Nash, DO   Assistants: None   Anesthesia: General endotracheal anesthesia  Operation: Flexible video fiberoptic bronchoscopy with robotic assistance and biopsies.  Estimated Blood Loss: Minimal  Complications: None  Indications and History: Laurie Mccoy is a 80 y.o. female with history of tobacco abuse, bilateral lung nodules. The risks, benefits, complications, treatment options and expected outcomes were discussed with the patient.  The possibilities of pneumothorax, pneumonia, reaction to medication, pulmonary aspiration, perforation of a viscus, bleeding, failure to diagnose a condition and creating a complication requiring transfusion or operation were discussed with the patient who freely signed the consent.    Description of Procedure: The patient was seen in the Preoperative Area, was examined and was deemed appropriate to proceed.  The patient was taken to [facility] endoscopy room 3, identified as [name] and the procedure verified as Flexible Video Fiberoptic Bronchoscopy.  A Time Out was held and the above information confirmed.   Prior to the date of the procedure a high-resolution CT scan of the chest was performed. Utilizing ION software program a virtual tracheobronchial tree was generated to allow the creation of distinct navigation pathways to the patient's parenchymal abnormalities. After being taken to the operating room general anesthesia was initiated and the patient  was orally intubated. The video fiberoptic bronchoscope was introduced via the endotracheal tube and a general inspection was performed which showed normal right and left lung anatomy, aspiration of the bilateral mainstems was completed to remove any remaining secretions. Robotic catheter inserted  into patient's endotracheal tube.   Target #1 right lobe groundglass opacity: The distinct navigation pathways prepared prior to this procedure were then utilized to navigate to patient's lesion identified on CT scan. The robotic catheter was secured into place and the vision probe was withdrawn.  Lesion location was approximated using fluoroscopy and radial endobronchial ultrasound for peripheral targeting. Under fluoroscopic guidance transbronchial needle brushings, and transbronchial forceps biopsies were performed to be sent for cytology and pathology.  Following tissue sampling a single fiducial was placed within the right upper lobe lesion under fluoroscopic guidance with a fiducial catheter and wire delivery kit.  A bronchioalveolar lavage was performed in the right upper lobe and sent for cytology.  Target #2 left upper lobe proximal, cystic lesion plus nodule: The distinct navigation pathways prepared prior to this procedure were then utilized to navigate to patient's lesion identified on CT scan. The robotic catheter was secured into place and the vision probe was withdrawn.  Lesion location was approximated using fluoroscopy and radial endobronchial ultrasound for peripheral targeting.  After multiple attempts of maneuvering of the catheter we could not locate a space that we felt justified to take a sample.  Due to the proximal nature of the lesion as well as associated cyst formation and proximity to the fissure line It was felt safest not to take additional biopsies.  At the end of the procedure a general airway inspection was performed and there was no evidence of active bleeding. The bronchoscope was removed.  The patient tolerated the procedure well. There was no significant blood loss and there were no obvious complications. A post-procedural chest x-ray is pending.  Samples Target #1: 1. Transbronchial needle brushings from right upper lobe 3. Transbronchial forceps biopsies from right  upper lobe 4. Bronchoalveolar lavage from right upper lobe  Plans:  The patient will be discharged from the PACU to home when recovered from anesthesia and after chest x-ray is reviewed. We will review the cytology, pathology and microbiology results with the patient when they become available. Outpatient followup will be with Garner Nash, [place] Lyndal Alamillo, DO [facility] 06/12/2021 12:47 PM

## 2021-06-12 NOTE — Anesthesia Postprocedure Evaluation (Signed)
Anesthesia Post Note  Patient: [name]  Procedure(s) Performed: VIDEO BRONCHOSCOPY WITH ENDOBRONCHIAL NAVIGATION (Bilateral) RADIAL ENDOBRONCHIAL ULTRASOUND BRONCHIAL BIOPSIES BRONCHIAL BRUSHINGS BRONCHIAL WASHINGS FIDUCIAL MARKER PLACEMENT     Patient location during evaluation: PACU Anesthesia Type: General Level of consciousness: awake Pain management: pain level controlled Vital Signs Assessment: post-procedure vital signs reviewed and stable Respiratory status: spontaneous breathing and respiratory function stable Cardiovascular status: stable Postop Assessment: no apparent nausea or vomiting Anesthetic complications: no   No notable events documented.  Last Vitals:  Vitals:   06/12/21 1326 06/12/21 1341  BP: (!) 159/70 (!) 140/44  Pulse: 72 72  Resp: 14 18  Temp:    SpO2: 96% 96%    Last Pain:  Vitals:   06/12/21 1341  TempSrc:   PainSc: 0-No pain                 Merlinda Frederick

## 2021-06-12 NOTE — Transfer of Care (Signed)
Immediate Anesthesia Transfer of Care Note  Patient: Laurie Mccoy  Procedure(s) Performed: VIDEO BRONCHOSCOPY WITH ENDOBRONCHIAL NAVIGATION (Bilateral) RADIAL ENDOBRONCHIAL ULTRASOUND BRONCHIAL BIOPSIES  Patient Location: PACU  Anesthesia Type:General  Level of Consciousness: awake  Airway & Oxygen Therapy: Patient Spontanous Breathing and Patient connected to face mask oxygen  Post-op Assessment: Report given to RN and Post -op Vital signs reviewed and stable  Post vital signs: Reviewed and stable  Last Vitals:  Vitals Value Taken Time  BP 151/65 06/12/21 1241  Temp 36.8 C 06/12/21 1241  Pulse 46 06/12/21 1242  Resp 17 06/12/21 1242  SpO2 97 % 06/12/21 1242  Vitals shown include unvalidated device data.  Last Pain:  Vitals:   06/12/21 0901  TempSrc:   PainSc: 0-No pain         Complications: No notable events documented.

## 2021-06-12 NOTE — Telephone Encounter (Signed)
BI please advise. Thanks   General instructions:  No 1. Use of cpap   Second: overthecounter and other drugs   Third:  when cen I start PT for back   Fourth:  when can I start a natural path to improve COPD.

## 2021-06-12 NOTE — Anesthesia Procedure Notes (Signed)
Procedure Name: Intubation Date/Time: 06/12/2021 11:31 AM Performed by: Lieutenant Diego, CRNA Pre-anesthesia Checklist: Patient identified, Emergency Drugs available, Suction available and Patient being monitored Patient Re-evaluated:Patient Re-evaluated prior to induction Oxygen Delivery Method: Circle system utilized Preoxygenation: Pre-oxygenation with 100% oxygen Induction Type: IV induction Ventilation: Mask ventilation without difficulty Laryngoscope Size: Miller, 2, Glidescope, Mac and 4 Grade View: Grade II Tube type: Oral Tube size: 8.5 mm Number of attempts: 1 Airway Equipment and Method: Stylet, Oral airway and Bougie stylet Placement Confirmation: ETT inserted through vocal cords under direct vision, positive ETCO2 and breath sounds checked- equal and bilateral Tube secured with: Tape Dental Injury: Teeth and Oropharynx as per pre-operative assessment  Difficulty Due To: Difficulty was unanticipated, Difficult Airway- due to anterior larynx and Difficult Airway- due to dentition Future Recommendations: Recommend- induction with short-acting agent, and alternative techniques readily available

## 2021-06-13 ENCOUNTER — Encounter (HOSPITAL_COMMUNITY): Payer: Self-pay | Admitting: Pulmonary Disease

## 2021-06-13 LAB — ACID FAST SMEAR (AFB, MYCOBACTERIA): Acid Fast Smear: NEGATIVE

## 2021-06-14 ENCOUNTER — Telehealth: Payer: Self-pay | Admitting: Internal Medicine

## 2021-06-14 LAB — CULTURE, BAL-QUANTITATIVE W GRAM STAIN: Culture: NO GROWTH

## 2021-06-14 LAB — CYTOLOGY - NON PAP

## 2021-06-15 NOTE — Telephone Encounter (Signed)
Called and spoke with Patient.  Patient was unsure who called and no messages or encounters in chart.

## 2021-06-17 LAB — AEROBIC/ANAEROBIC CULTURE W GRAM STAIN (SURGICAL/DEEP WOUND): Culture: NO GROWTH

## 2021-07-05 NOTE — Progress Notes (Signed)
Cardiology Office Note:    Date:  07/06/2021  NAME:  Laurie Mccoy    MRN: [id number] DOB:  [birth year]   PCP:  [name], NP  Cardiologist:  Shelva Majestic, MD  Electrophysiologist:  None   Referring MD: [name], NP   Chief Complaint  Patient presents with   Follow-up    History of Present Illness:    Laurie Mccoy is a 80 y.o. female with a hx of nonobstructive CAD, hypertension, tobacco abuse, sleep apnea who presents for follow-up.  She reports she is doing well.  Recently underwent bronchoscopy for lung nodules.  Blood pressure 144/74.  Seems to be well controlled on telmisartan and HCTZ.  She reports she would like to start taking HCTZ in the morning due to urination at night.  I encouraged her to do this.  She describes no chest pain or trouble breathing.  She is not exercising due to back pain.  She will complete physical therapy soon.  Hopes to become more active.  She is working on dieting more.  Weight still not going down.  She knows what she needs to do.  I did review her lipid profile.  LDL has gone up to 77 from 62.  HDL also has decreased.  She also is still smoking but trying to quit.  She was counseled about smoking cessation in the office today.  She otherwise seems to be relatively stable.  Denies any symptoms of angina today in office.  Problem List 1. 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 Tobacco abuse 3. Hypertension  4. OSA 5. Lung nodules   Past Medical History: Past Medical History:  Diagnosis Date   Asthma    Hypertension    Sleep apnea     Past Surgical History: Past Surgical History:  Procedure Laterality Date   APPENDECTOMY     BRONCHIAL BIOPSY  06/12/2021   Procedure: BRONCHIAL BIOPSIES;  Surgeon: Garner Nash, DO;  Location: [facility];  Service: Pulmonary;;   BRONCHIAL BRUSHINGS  06/12/2021   Procedure: BRONCHIAL BRUSHINGS;   Surgeon: Garner Nash, DO;  Location: [facility];  Service: Pulmonary;;   BRONCHIAL WASHINGS  06/12/2021   Procedure: BRONCHIAL WASHINGS;  Surgeon: Garner Nash, DO;  Location: [facility];  Service: Pulmonary;;   CHOLECYSTECTOMY     COLON RESECTION     FIDUCIAL MARKER PLACEMENT  06/12/2021   Procedure: FIDUCIAL MARKER PLACEMENT;  Surgeon: Garner Nash, DO;  Location: [facility];  Service: Pulmonary;;   hernia     TONSILLECTOMY     VIDEO BRONCHOSCOPY WITH ENDOBRONCHIAL NAVIGATION Bilateral 06/12/2021   Procedure: VIDEO BRONCHOSCOPY WITH ENDOBRONCHIAL NAVIGATION;  Surgeon: Garner Nash, DO;  Location: [facility];  Service: Pulmonary;  Laterality: Bilateral;  ION   VIDEO BRONCHOSCOPY WITH RADIAL ENDOBRONCHIAL ULTRASOUND  06/12/2021   Procedure: RADIAL ENDOBRONCHIAL ULTRASOUND;  Surgeon: Garner Nash, DO;  Location: [facility];  Service: Pulmonary;;    Current Medications: Current Meds  Medication Sig   acetaminophen (TYLENOL) 500 MG tablet Take 500 mg by mouth every 6 (six) hours as needed for moderate pain.   albuterol (PROVENTIL) (2.5 MG/3ML) 0.083% nebulizer solution Take 3 mLs (2.5 mg total) by nebulization every 4 (four) hours as needed for wheezing or shortness of breath.   albuterol (VENTOLIN HFA) 108 (90 Base) MCG/ACT inhaler Inhale 2 puffs into the lungs every 6 (six) hours as needed  for wheezing or shortness of breath.   ALPRAZolam (XANAX) 0.25 MG tablet Take 0.25 mg by mouth 2 (two) times daily as needed for anxiety.   Budeson-Glycopyrrol-Formoterol (BREZTRI AEROSPHERE) 160-9-4.8 MCG/ACT AERO Inhale 2 puffs into the lungs 2 (two) times daily.   Cyanocobalamin 5000 MCG/ML LIQD Place 5,000 mcg under the tongue daily.   famotidine (PEPCID) 20 MG tablet Take 20 mg by mouth at bedtime.   Homeopathic Products (LEG CRAMPS) TABS Take 2 tablets by mouth at bedtime as needed (leg cramps).   hydrochlorothiazide (HYDRODIURIL) 25 MG tablet Take 25 mg by mouth at bedtime.    loratadine (CLARITIN) 10 MG tablet Take 10 mg by mouth daily as needed for allergies.   Magnesium 400 MG CAPS Take 400-800 mg by mouth See admin instructions. Take 400 mg in the morning and 800 mg at night   nicotine (NICODERM CQ) 14 mg/24hr patch Place 1 patch (14 mg total) onto the skin daily.   OVER THE COUNTER MEDICATION Take 1 capsule by mouth every other day. v-vain otc supplement   OVER THE COUNTER MEDICATION Take 2 capsules by mouth in the morning and at bedtime. Healthy feet and nerves otc supplement   telmisartan (MICARDIS) 20 MG tablet Take 20 mg by mouth at bedtime.     Allergies:    Compazine [prochlorperazine], Norvasc [amlodipine], and Trelegy ellipta [fluticasone-umeclidin-vilant]   Social History: Social History   Socioeconomic History   Marital status: Married    Spouse name: Not on file   Number of children: Not on file   Years of education: Not on file   Highest education level: Not on file  Occupational History   Not on file  Tobacco Use   Smoking status: Some Days    Packs/day: 0.50    Years: 46.00    Pack years: 23.00    Types: Cigarettes    Last attempt to quit: 02/27/2020    Years since quitting: 1.3   Smokeless tobacco: Never   Tobacco comments:    no cigs in 3 days  Vaping Use   Vaping Use: Never used  Substance and Sexual Activity   Alcohol use: Not Currently   Drug use: Never   Sexual activity: Not on file  Other Topics Concern   Not on file  Social History Narrative   Not on file   Social Determinants of Health   Financial Resource Strain: Not on file  Food Insecurity: Not on file  Transportation Needs: Not on file  Physical Activity: Not on file  Stress: Not on file  Social Connections: Not on file     Family History: The patient's family history includes Brain cancer in her mother; Lung cancer in her father and mother; Stroke in her brother.  ROS:   All other ROS reviewed and negative. Pertinent positives noted in the HPI.     EKGs/Labs/Other Studies Reviewed:   The following studies were personally reviewed by me today:  Recent Labs: 06/12/2021: BUN 9; Creatinine, Ser 0.56; Hemoglobin 13.7; Platelets 246; Potassium 4.0; Sodium 136   Recent Lipid Panel No results found for: CHOL, TRIG, HDL, CHOLHDL, VLDL, LDLCALC, LDLDIRECT  Physical Exam:   VS:  BP (!) 144/74   Pulse 77   Ht 5\' 6"  (1.676 m)   Wt 202 lb 9.6 oz (91.9 kg)   SpO2 96%   BMI 32.70 kg/m    Wt Readings from Last 3 Encounters:  07/06/21 202 lb 9.6 oz (91.9 kg)  06/12/21 194 lb (88 kg)  05/30/21 198 lb 12.8 oz (90.2 kg)    General: Well nourished, well developed, in no acute distress Head: Atraumatic, normal size  Eyes: PEERLA, EOMI  Neck: Supple, no JVD Endocrine: No thryomegaly Cardiac: Normal S1, S2; RRR; no murmurs, rubs, or gallops Lungs: Clear to auscultation bilaterally, no wheezing, rhonchi or rales  Abd: Soft, nontender, no hepatomegaly  Ext: No edema, pulses 2+ Musculoskeletal: No deformities, BUE and BLE strength normal and equal Skin: Warm and dry, no rashes   Neuro: Alert and oriented to person, place, time, and situation, CNII-XII grossly intact, no focal deficits  Psych: Normal mood and affect   ASSESSMENT:   Korri Ask is a 80 y.o. female who presents for the following: 1. Coronary artery disease involving native coronary artery of native heart without angina pectoris   2. Mixed hyperlipidemia   3. Essential hypertension   4. Tobacco abuse     PLAN:   1. Coronary artery disease involving native coronary artery of native heart without angina pectoris 2. Mixed hyperlipidemia -Coronary CTA in 2020 demonstrated mild CAD.  She does have an elevated coronary calcium score 662 which is 89th percentile.  Her LDL cholesterol had always been less than 70.  We had held off on statin therapy at her request.  It now is above 77.  I have recommended low-dose statin therapy.  For now she would like to try diet and exercise.   We will see how this goes.  She should continue aspirin 81 mg daily.  She describes no symptoms concerning for angina.  3. Essential hypertension -Well-controlled.  She will continue telmisartan 20 mg daily as well as HCTZ 25 mg daily.  4. Tobacco abuse -4 minutes of smoking cessation counseling was provided in office today.  Disposition: Return in about 6 months (around 01/03/2022).  Medication Adjustments/Labs and Tests Ordered: Current medicines are reviewed at length with the patient today.  Concerns regarding medicines are outlined above.  No orders of the defined types were placed in this encounter.  No orders of the defined types were placed in this encounter.   Patient Instructions  Medication Instructions:  The current medical regimen is effective;  continue present plan and medications.  *If you need a refill on your cardiac medications before your next appointment, please call your pharmacy*    Follow-Up: At [facility], you and your health needs are our priority.  As part of our continuing mission to provide you with exceptional heart care, we have created designated Provider Care Teams.  These Care Teams include your primary Cardiologist (physician) and Advanced Practice Providers (APPs -  Physician Assistants and Nurse Practitioners) who all work together to provide you with the care you need, when you need it.  We recommend signing up for the patient portal called "MyChart".  Sign up information is provided on this After Visit Summary.  MyChart is used to connect with patients for Virtual Visits (Telemedicine).  Patients are able to view lab/test results, encounter notes, upcoming appointments, etc.  Non-urgent messages can be sent to your provider as well.   To learn more about what you can do with MyChart, go to [email or web address].    Your next appointment:   6 month(s)  The format for your next appointment:   In Person  Provider:   Eleonore Chiquito, MD      Time Spent with Patient: I have spent a total of 35 minutes with patient reviewing hospital notes, telemetry, EKGs, labs and examining the patient as  well as establishing an assessment and plan that was discussed with the patient.  > 50% of time was spent in direct patient care.  Signed, Addison Naegeli. Audie Box, MD, [place]  [address] [place], [place] [address] [phone]  07/06/2021 4:28 PM

## 2021-07-06 ENCOUNTER — Encounter (HOSPITAL_BASED_OUTPATIENT_CLINIC_OR_DEPARTMENT_OTHER): Payer: Self-pay | Admitting: Cardiovascular Disease

## 2021-07-06 ENCOUNTER — Other Ambulatory Visit: Payer: Self-pay

## 2021-07-06 ENCOUNTER — Ambulatory Visit (INDEPENDENT_AMBULATORY_CARE_PROVIDER_SITE_OTHER): Payer: Medicare Other | Admitting: Cardiovascular Disease

## 2021-07-06 VITALS — BP 144/74 | HR 77 | Ht 66.0 in | Wt 202.6 lb

## 2021-07-06 DIAGNOSIS — F17200 Nicotine dependence, unspecified, uncomplicated: Secondary | ICD-10-CM

## 2021-07-06 DIAGNOSIS — Z720 Tobacco use: Secondary | ICD-10-CM

## 2021-07-06 DIAGNOSIS — E782 Mixed hyperlipidemia: Secondary | ICD-10-CM | POA: Diagnosis not present

## 2021-07-06 DIAGNOSIS — I2510 Atherosclerotic heart disease of native coronary artery without angina pectoris: Secondary | ICD-10-CM

## 2021-07-06 DIAGNOSIS — I10 Essential (primary) hypertension: Secondary | ICD-10-CM | POA: Diagnosis not present

## 2021-07-06 DIAGNOSIS — F17210 Nicotine dependence, cigarettes, uncomplicated: Secondary | ICD-10-CM

## 2021-07-06 NOTE — Patient Instructions (Signed)
Medication Instructions:  The current medical regimen is effective;  continue present plan and medications.  *If you need a refill on your cardiac medications before your next appointment, please call your pharmacy*   Follow-Up: At [facility], you and your health needs are our priority.  As part of our continuing mission to provide you with exceptional heart care, we have created designated Provider Care Teams.  These Care Teams include your primary Cardiologist (physician) and Advanced Practice Providers (APPs -  Physician Assistants and Nurse Practitioners) who all work together to provide you with the care you need, when you need it.  We recommend signing up for the patient portal called "MyChart".  Sign up information is provided on this After Visit Summary.  MyChart is used to connect with patients for Virtual Visits (Telemedicine).  Patients are able to view lab/test results, encounter notes, upcoming appointments, etc.  Non-urgent messages can be sent to your provider as well.   To learn more about what you can do with MyChart, go to [email or web address].    Your next appointment:   6 month(s)  The format for your next appointment:   In Person  Provider:   Callaway O'Neal, MD      

## 2021-07-12 LAB — FUNGAL ORGANISM REFLEX

## 2021-07-12 LAB — FUNGUS CULTURE WITH STAIN

## 2021-07-12 LAB — FUNGUS CULTURE RESULT

## 2021-07-16 ENCOUNTER — Other Ambulatory Visit: Payer: Self-pay

## 2021-07-16 ENCOUNTER — Encounter: Payer: Self-pay | Admitting: Pulmonary Disease

## 2021-07-16 ENCOUNTER — Ambulatory Visit (INDEPENDENT_AMBULATORY_CARE_PROVIDER_SITE_OTHER): Payer: Medicare Other | Admitting: Pulmonary Disease

## 2021-07-16 VITALS — BP 128/58 | HR 100 | Temp 98.1°F | Ht 66.0 in | Wt 200.6 lb

## 2021-07-16 DIAGNOSIS — R911 Solitary pulmonary nodule: Secondary | ICD-10-CM | POA: Diagnosis not present

## 2021-07-16 DIAGNOSIS — F17200 Nicotine dependence, unspecified, uncomplicated: Secondary | ICD-10-CM | POA: Diagnosis not present

## 2021-07-16 DIAGNOSIS — R918 Other nonspecific abnormal finding of lung field: Secondary | ICD-10-CM

## 2021-07-16 NOTE — Patient Instructions (Signed)
Thank you for visiting Dr. Valeta Harms at [facility]. Today we recommend the following:  Orders Placed This Encounter  Procedures   CT Super D Chest Wo Contrast    Return in about 6 months (around 01/14/2022) for with APP or Dr. Valeta Harms.    Please do your part to reduce the spread of COVID-19.

## 2021-07-16 NOTE — Progress Notes (Signed)
Synopsis: Referred in August 2022 for lung nodule by [name], NP  Subjective:   PATIENT ID: Laurie Mccoy GENDER: female DOB: June 12, 1941, MRN: [id number]  Chief Complaint  Patient presents with   Follow-up    This is a 80 year old female, asthma, hypertension, past medical history, family history of lung cancer mother never smoker, father lung cancer former smoker.  Presents today for evaluation of abnormal imaging.  Patient had a noncontrasted CT of the chest in June 2022.  He had slight interval increase and a irregular lobular shaped posterior right upper lobe groundglass subsolid lesion.  He had other smaller groundglass lesions that had resolved.  Also had a 2.6 cm left thyroid nodule.  Due to the increasing size in the lesion there was concern for underlying malignancy.  Patient was seen by Dr. Melvyn Novas.  Referred here today to discuss bronchoscopy and next steps.  OV 05/30/2021: Discussed images today in the office with patient.  We discussed all the pros and cons of watchful waiting versus tissue sampling.   OV 07/16/2021: Here today for follow-up after bronchoscopy.  Patient started the encounter upset about not receiving a call regarding her bronchoscopy results.  I explained that I would look into why she was not contacted.  I also explained that if she had not heard from our office and had concerns that she should have reached out.  She stated that she was upset and therefore decided just to "wait and see how long it was going to take".  I explained that that type of attitude had no bearing on helping the situation whatsoever.  And that if she has questions regarding issues from our office or myself that she should contact us directly and not set at home wondering what the next steps may be.  Once we got this past we were able to have a discussion regarding next steps regarding her nodule.  We reviewed these today in the office.  Her pathology results were negative for malignancy.   I did explain that both lesions within the chest that she has are concerning for malignancy.  Even though she had negative biopsy results I think they need to be watched closely.  This was explained today again in detail.         Past Medical History:  Diagnosis Date   Asthma    Hypertension    Sleep apnea      Family History  Problem Relation Age of Onset   Lung cancer Mother        never smoker   Brain cancer Mother    Lung cancer Father        smoked   Stroke Brother      Past Surgical History:  Procedure Laterality Date   APPENDECTOMY     BRONCHIAL BIOPSY  06/12/2021   Procedure: BRONCHIAL BIOPSIES;  Surgeon: Garner Nash, DO;  Location: [facility];  Service: Pulmonary;;   BRONCHIAL BRUSHINGS  06/12/2021   Procedure: BRONCHIAL BRUSHINGS;  Surgeon: Garner Nash, DO;  Location: [facility];  Service: Pulmonary;;   BRONCHIAL WASHINGS  06/12/2021   Procedure: BRONCHIAL WASHINGS;  Surgeon: Garner Nash, DO;  Location: [facility];  Service: Pulmonary;;   CHOLECYSTECTOMY     COLON RESECTION     FIDUCIAL MARKER PLACEMENT  06/12/2021   Procedure: FIDUCIAL MARKER PLACEMENT;  Surgeon: Garner Nash, DO;  Location: [facility];  Service: Pulmonary;;   hernia     TONSILLECTOMY     VIDEO  BRONCHOSCOPY WITH ENDOBRONCHIAL NAVIGATION Bilateral 06/12/2021   Procedure: VIDEO BRONCHOSCOPY WITH ENDOBRONCHIAL NAVIGATION;  Surgeon: Garner Nash, DO;  Location: [facility];  Service: Pulmonary;  Laterality: Bilateral;  ION   VIDEO BRONCHOSCOPY WITH RADIAL ENDOBRONCHIAL ULTRASOUND  06/12/2021   Procedure: RADIAL ENDOBRONCHIAL ULTRASOUND;  Surgeon: Garner Nash, DO;  Location: [facility];  Service: Pulmonary;;    Social History   Socioeconomic History   Marital status: Married    Spouse name: Not on file   Number of children: Not on file   Years of education: Not on file   Highest education level: Not on file  Occupational History   Not on file  Tobacco Use    Smoking status: Some Days    Packs/day: 0.50    Years: 46.00    Pack years: 23.00    Types: Cigarettes    Last attempt to quit: 02/27/2020    Years since quitting: 1.3   Smokeless tobacco: Never   Tobacco comments:    no cigs in 3 days  Vaping Use   Vaping Use: Never used  Substance and Sexual Activity   Alcohol use: Not Currently   Drug use: Never   Sexual activity: Not on file  Other Topics Concern   Not on file  Social History Narrative   Not on file   Social Determinants of Health   Financial Resource Strain: Not on file  Food Insecurity: Not on file  Transportation Needs: Not on file  Physical Activity: Not on file  Stress: Not on file  Social Connections: Not on file  Intimate Partner Violence: Not on file     Allergies  Allergen Reactions   Compazine [Prochlorperazine]     Lose muscle control in face    Norvasc [Amlodipine] Swelling    Lower extremity swelling     Trelegy Ellipta [Fluticasone-Umeclidin-Vilant] Swelling    Swelling in legs and headache.     Outpatient Medications Prior to Visit  Medication Sig Dispense Refill   acetaminophen (TYLENOL) 500 MG tablet Take 500 mg by mouth every 6 (six) hours as needed for moderate pain.     albuterol (PROVENTIL) (2.5 MG/3ML) 0.083% nebulizer solution Take 3 mLs (2.5 mg total) by nebulization every 4 (four) hours as needed for wheezing or shortness of breath.     albuterol (VENTOLIN HFA) 108 (90 Base) MCG/ACT inhaler Inhale 2 puffs into the lungs every 6 (six) hours as needed for wheezing or shortness of breath. 18 g 1   ALPRAZolam (XANAX) 0.25 MG tablet Take 0.25 mg by mouth 2 (two) times daily as needed for anxiety.     Budeson-Glycopyrrol-Formoterol (BREZTRI AEROSPHERE) 160-9-4.8 MCG/ACT AERO Inhale 2 puffs into the lungs 2 (two) times daily.     Cyanocobalamin 5000 MCG/ML LIQD Place 5,000 mcg under the tongue daily.     famotidine (PEPCID) 20 MG tablet Take 20 mg by mouth at bedtime.     Homeopathic Products  (LEG CRAMPS) TABS Take 2 tablets by mouth at bedtime as needed (leg cramps).     hydrochlorothiazide (HYDRODIURIL) 25 MG tablet Take 25 mg by mouth at bedtime.     loratadine (CLARITIN) 10 MG tablet Take 10 mg by mouth daily as needed for allergies.     Magnesium 400 MG CAPS Take 400-800 mg by mouth See admin instructions. Take 400 mg in the morning and 800 mg at night     nicotine (NICODERM CQ) 14 mg/24hr patch Place 1 patch (14 mg total) onto the skin daily.  28 patch 6   OVER THE COUNTER MEDICATION Take 1 capsule by mouth every other day. v-vain otc supplement     OVER THE COUNTER MEDICATION Take 2 capsules by mouth in the morning and at bedtime. Healthy feet and nerves otc supplement     telmisartan (MICARDIS) 20 MG tablet Take 20 mg by mouth at bedtime.     No facility-administered medications prior to visit.    Review of Systems  Constitutional:  Negative for chills, fever, malaise/fatigue and weight loss.  HENT:  Negative for hearing loss, sore throat and tinnitus.   Eyes:  Negative for blurred vision and double vision.  Respiratory:  Negative for cough, hemoptysis, sputum production, shortness of breath, wheezing and stridor.   Cardiovascular:  Negative for chest pain, palpitations, orthopnea, leg swelling and PND.  Gastrointestinal:  Negative for abdominal pain, constipation, diarrhea, heartburn, nausea and vomiting.  Genitourinary:  Negative for dysuria, hematuria and urgency.  Musculoskeletal:  Negative for joint pain and myalgias.  Skin:  Negative for itching and rash.  Neurological:  Negative for dizziness, tingling, weakness and headaches.  Endo/Heme/Allergies:  Negative for environmental allergies. Does not bruise/bleed easily.  Psychiatric/Behavioral:  Negative for depression. The patient is not nervous/anxious and does not have insomnia.   All other systems reviewed and are negative.   Objective:  Physical Exam Vitals reviewed.  Constitutional:      General: She is not  in acute distress.    Appearance: She is well-developed. She is obese.  HENT:     Head: Normocephalic and atraumatic.  Eyes:     General: No scleral icterus.    Conjunctiva/sclera: Conjunctivae normal.     Pupils: Pupils are equal, round, and reactive to light.  Neck:     Vascular: No JVD.     Trachea: No tracheal deviation.  Cardiovascular:     Rate and Rhythm: Normal rate and regular rhythm.     Heart sounds: Normal heart sounds. No murmur heard. Pulmonary:     Effort: Pulmonary effort is normal. No tachypnea, accessory muscle usage or respiratory distress.     Breath sounds: Normal breath sounds. No stridor. No wheezing, rhonchi or rales.  Abdominal:     General: Bowel sounds are normal. There is no distension.     Palpations: Abdomen is soft.     Tenderness: There is no abdominal tenderness.  Musculoskeletal:        General: No tenderness.     Cervical back: Neck supple.  Lymphadenopathy:     Cervical: No cervical adenopathy.  Skin:    General: Skin is warm and dry.     Capillary Refill: Capillary refill takes less than 2 seconds.     Findings: No rash.  Neurological:     Mental Status: She is alert and oriented to person, place, and time.  Psychiatric:        Behavior: Behavior normal.     Vitals:   07/16/21 1106  BP: (!) 128/58  Pulse: 100  Temp: 98.1 F (36.7 C)  TempSrc: Oral  SpO2: 94%  Weight: 200 lb 9.6 oz (91 kg)  Height: 5\' 6"  (1.676 m)   94% on RA BMI Readings from Last 3 Encounters:  07/16/21 32.38 kg/m  07/06/21 32.70 kg/m  06/12/21 31.31 kg/m   Wt Readings from Last 3 Encounters:  07/16/21 200 lb 9.6 oz (91 kg)  07/06/21 202 lb 9.6 oz (91.9 kg)  06/12/21 194 lb (88 kg)     CBC  Component Value Date/Time   WBC 7.2 06/12/2021 0837   RBC 4.28 06/12/2021 0837   HGB 13.7 06/12/2021 0837   HCT 42.2 06/12/2021 0837   PLT 246 06/12/2021 0837   MCV 98.6 06/12/2021 0837   MCH 32.0 06/12/2021 0837   MCHC 32.5 06/12/2021 0837   RDW  12.4 06/12/2021 0837     Chest Imaging: June 2022 CT chest: Patient with a right upper lobe groundglass subsolid lesion 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  Additionally there is a left upper lobe proximal cystic cavity defined as well. Both could represent malignancy. The patient's images have been independently reviewed by me.    Pulmonary Functions Testing Results: No flowsheet data found.  FeNO:   Pathology:   Echocardiogram:   Heart Catheterization:     Assessment & Plan:     ICD-10-CM   1. Lung nodules  R91.8 CT Super D Chest Wo Contrast    2. Right upper lobe pulmonary nodule  R91.1 CT Super D Chest Wo Contrast    3. Ground glass opacity present on imaging of lung  R91.8 CT Super D Chest Wo Contrast    4. Current smoker  F17.200 CT Super D Chest Wo Contrast      Discussion:  This is a 80 year old female, abnormal CT imaging of the chest.  Status post bronchoscopy with tissue sampling for a subsolid lesion within the right upper lobe as well as a cystic lesion within the proximal hilum of the left upper lobe.  Biopsies from the right were negative we were unable to obtain access to the left location to complete any biopsies.  I do believe that both are still at high risk for being malignant.  And she needs close follow-up for this.  We discussed this in detail today as well as reviewing the images.  Plan: Repeat noncontrasted CT chest in 6 months. If the subsolid lesion in the right upper lobe changes with any different morphology, becomes larger or has a more solid component develop I think that she needs to consider repeat biopsy. Patient is agreeable to this plan. I think that the other lesion in the proximal left hilum is difficult to access.  Potential need for three-dimensional imaging may help with this in the future. If we were to prove that she has malignancy on 1 side could consider empiric treatments to the other if we are unable to obtain a diagnosis. We  discussed this in detail today as well. Follow-up with [place] in 6 months after the repeat CT scan of the chest.   Current Outpatient Medications:    acetaminophen (TYLENOL) 500 MG tablet, Take 500 mg by mouth every 6 (six) hours as needed for moderate pain., Disp: , Rfl:    albuterol (PROVENTIL) (2.5 MG/3ML) 0.083% nebulizer solution, Take 3 mLs (2.5 mg total) by nebulization every 4 (four) hours as needed for wheezing or shortness of breath., Disp: , Rfl:    albuterol (VENTOLIN HFA) 108 (90 Base) MCG/ACT inhaler, Inhale 2 puffs into the lungs every 6 (six) hours as needed for wheezing or shortness of breath., Disp: 18 g, Rfl: 1   ALPRAZolam (XANAX) 0.25 MG tablet, Take 0.25 mg by mouth 2 (two) times daily as needed for anxiety., Disp: , Rfl:    Budeson-Glycopyrrol-Formoterol (BREZTRI AEROSPHERE) 160-9-4.8 MCG/ACT AERO, Inhale 2 puffs into the lungs 2 (two) times daily., Disp: , Rfl:    Cyanocobalamin 5000 MCG/ML LIQD, Place 5,000 mcg under the tongue daily., Disp: , Rfl:    famotidine (  PEPCID) 20 MG tablet, Take 20 mg by mouth at bedtime., Disp: , Rfl:    Homeopathic Products (LEG CRAMPS) TABS, Take 2 tablets by mouth at bedtime as needed (leg cramps)., Disp: , Rfl:    hydrochlorothiazide (HYDRODIURIL) 25 MG tablet, Take 25 mg by mouth at bedtime., Disp: , Rfl:    loratadine (CLARITIN) 10 MG tablet, Take 10 mg by mouth daily as needed for allergies., Disp: , Rfl:    Magnesium 400 MG CAPS, Take 400-800 mg by mouth See admin instructions. Take 400 mg in the morning and 800 mg at night, Disp: , Rfl:    nicotine (NICODERM CQ) 14 mg/24hr patch, Place 1 patch (14 mg total) onto the skin daily., Disp: 28 patch, Rfl: 6   OVER THE COUNTER MEDICATION, Take 1 capsule by mouth every other day. v-vain otc supplement, Disp: , Rfl:    OVER THE COUNTER MEDICATION, Take 2 capsules by mouth in the morning and at bedtime. Healthy feet and nerves otc supplement, Disp: , Rfl:    telmisartan (MICARDIS) 20 MG tablet, Take  20 mg by mouth at bedtime., Disp: , Rfl:     Garner Nash, DO  Pulmonary Critical Care 07/16/2021 11:18 AM

## 2021-07-25 ENCOUNTER — Other Ambulatory Visit (HOSPITAL_COMMUNITY): Payer: Self-pay

## 2021-07-25 ENCOUNTER — Telehealth: Payer: Self-pay | Admitting: Pharmacy Technician

## 2021-07-25 NOTE — Telephone Encounter (Signed)
Patient Advocate Encounter  Received notification from [facility] regarding a prior authorization for BREZTRI. Authorization has been APPROVED to 10.31.23.    Luciano Cutter, CPhT Patient Advocate Phone: [phone] Fax:  [phone]

## 2021-07-26 LAB — ACID FAST CULTURE WITH REFLEXED SENSITIVITIES (MYCOBACTERIA): Acid Fast Culture: NEGATIVE

## 2021-09-26 ENCOUNTER — Telehealth: Payer: Self-pay | Admitting: Cardiovascular Disease

## 2021-09-26 NOTE — Telephone Encounter (Signed)
Pt c/o medication issue:  1. Name of Medication: Dexamethasone 6 MG  telmisartan (MICARDIS) 20 MG tablet  2. How are you currently taking this medication (dosage and times per day)? Not currently taking either medication   3. Are you having a reaction (difficulty breathing--STAT)? No   4. What is your medication issue? Juliann Pulse is calling with two questions about the listed above medications. She states her PCP had put her on Dexamethasone 6 MG due to being sick. She began taking it twice a day, once in the morning and once at night for a few days. After doing this a couple days she realized it was too much for her, due to it keeping her up at night. She then began taking it once in the morning, but is no longer taking it due to now being well. She is wanting to know if Dr. Audie Box would give her approval to take this every once in a while when she needs an "extra kick" of energy.  She states she is currently trying to exercise more due to her A1c being up, and this gives her the energy she needs to do so. She discussed this with her PCP and they advised her to consult with Dr. Audie Box in regards to it since it is a steroid. Juliann Pulse also reports her BP has been running low in the evenings ranging 140's/60's. Due to this she has not been taking her telmisartan. She is wanting to confirm this is something she should be doing when her BP runs low at night. Please advise.

## 2021-09-26 NOTE — Telephone Encounter (Signed)
Spoke to patient she state she was concern about blood pressure being decrease.  She was speaking of the diastolic  number being in the 60's.  Systolic blood pressure 007'M. RN informed patient to continue taking Telmisartan 20 mg at bedtime.   Patient verbalized understanding.  Patient still wanted to know if from cardiac standpoint if she can use Dexamethasone . RN informed patient that medication is usually used for  inflammation/ infection not to be used to increase energy. Patient states she has 1 and 1/2 tablets left.[name]  aware will defer to Dr Audie Box . Aware not in office at present time.

## 2021-09-28 NOTE — Telephone Encounter (Signed)
Called patient - advised of message below.  Patient verbalized understanding.

## 2021-10-16 ENCOUNTER — Other Ambulatory Visit: Payer: Self-pay | Admitting: Internal Medicine

## 2021-10-16 ENCOUNTER — Telehealth: Payer: Self-pay | Admitting: Internal Medicine

## 2021-10-17 NOTE — Telephone Encounter (Signed)
Called and spoke with pt letting her know that we did send a refill for her [name] to the pharmacy for her and she verbalized understanding.  While speaking with pt about her [name], pt said when she gets 3 inhalers of the [place], she pays a little over $1,000. Asked if she had ever applied for pt assistance and she said that she has gone online to try to locate coupons/copay cards for the [facility] but said that she was not able to find anything.  Pt wants to know if we had any recommendations for her due to the cost of the [place]. Dr. Melvyn Novas, please advise.

## 2021-10-17 NOTE — Telephone Encounter (Signed)
° °  Let patient know there are no simple substitutes and that it is hard to get insurance to cover meds not on their formulary, which the patient has access to and we don't, and their insurance company is obligated to provide it to them.   Rec: Give enough samples (if she doesn't have enough to last0 until we can get pt in and work out a substitute.  On return patient should provide a with a list of alternatives provided by his/her insurance company.

## 2021-10-17 NOTE — Telephone Encounter (Signed)
Called and spoke with pt letting her know the info stated by Dr. Melvyn Novas. Pt said that [name] is not on her formulary list but Trelegy is however she does not want to switch from [place] to the Trelegy due to the [place] working well for her.  Pt said originally she was paying around $500 for one Breztri inhaler but her insurance has made an exception to charge her around $1000 for 3 Breztri inhalers instead of her having to pay $500 for one.  While on the phone with pt, I did schedule her a follow up with Dr. Melvyn Novas. Nothing further needed.

## 2021-10-25 ENCOUNTER — Encounter: Payer: Self-pay | Admitting: Internal Medicine

## 2021-10-25 ENCOUNTER — Ambulatory Visit (INDEPENDENT_AMBULATORY_CARE_PROVIDER_SITE_OTHER): Payer: Medicare Other | Admitting: Internal Medicine

## 2021-10-25 ENCOUNTER — Other Ambulatory Visit: Payer: Self-pay

## 2021-10-25 DIAGNOSIS — F17210 Nicotine dependence, cigarettes, uncomplicated: Secondary | ICD-10-CM

## 2021-10-25 DIAGNOSIS — R918 Other nonspecific abnormal finding of lung field: Secondary | ICD-10-CM

## 2021-10-25 DIAGNOSIS — J449 Chronic obstructive pulmonary disease, unspecified: Secondary | ICD-10-CM

## 2021-10-25 MED ORDER — BREZTRI AEROSPHERE 160-9-4.8 MCG/ACT IN AERO: INHALATION_SPRAY | RESPIRATORY_TRACT | 0 refills | Status: DC

## 2021-10-25 MED ORDER — PREDNISONE 10 MG PO TABS: ORAL_TABLET | ORAL | 5 refills | Status: DC

## 2021-10-25 NOTE — Assessment & Plan Note (Signed)
See CT  10/06/2019   - CT chest 02/15/20 1. Numerous bilateral ground-glass pulmonary nodules  On new  7 x 5 x 8 RUL , all others s change  Annual CT is recommended until 5 years of stability has been established. If persistent these nodules should be considered highly suspicious if the solid component of the nodule is 6 mm or greater in size and enlarging. > annual x one year = 02/14/21 placed in reminder file   . 2.8 cm left lobe thyroid nodule.   >>> f/u Dr  Steffanie Dunn in [place]  CT 04/03/21 1. Slight interval increase in size of the irregular somewhat lobular posterior right upper lobe ground-glass pulmonary lesion. Imaging features remain concerning for neoplasm>>> referred to Dr Windell Norfolk 04/05/2021 > neg bx 2. Interval resolution of the 7 mm ground-glass nodule posterior left upper lobe and 7 mm ground-glass nodule seen previously in the posterior left lower lobe     F/u planned by Dr Windell Norfolk, assured her she's in very good hands and that attempting to prove something so small is difficult to prove benignancy is a challenge

## 2021-10-25 NOTE — Assessment & Plan Note (Signed)
Active smoker Spirometry 12/28/2018  FEV1 0.82 (37%)  Ratio 0.56 with classic curvature - d/c acei around 1st of May 2020 - d/c advair 500 03/10/2019   - 03/10/2019    try symbicort 160 2bid  But worse sob, more saba by 04/12/2019  - alpha one AT  03/10/19   MM   Level 129  - 04/12/2019  After extensive coaching inhaler device,  effectiveness =    90% with elipta, try trelegy  05/10/2019 >>> patient reported reaction to Trelegy Ellipta = thush/ palpitations, transition back to Symbicort 05/10/2019 >>> pulmonary function testing ordered 08/12/2019 rec incruse one click each am> cough worse, did not do as well when d/c pred despite rx for GERD - 10/25/2021  After extensive coaching inhaler device,  effectiveness =    90%     Group D in terms of symptom/risk and laba/lama/ICS  therefore appropriate rx at this point >>>  Continue breztri and approp saba   Re SABA :  I spent extra time with pt today reviewing appropriate use of albuterol for prn use on exertion with the following points: 1) saba is for relief of sob that does not improve by walking a slower pace or resting but rather if the pt does not improve after trying this first. 2) If the pt is convinced, as many are, that saba helps recover from activity faster then it's easy to tell if this is the case by re-challenging : ie stop, take the inhaler, then p 5 minutes try the exact same activity (intensity of workload) that just caused the symptoms and see if they are substantially diminished or not after saba 3) if there is an activity that reproducibly causes the symptoms, try the saba 15 min before the activity on alternate days   If in fact the saba really does help, then fine to continue to use it prn but advised may need to look closer at the maintenance regimen being used to achieve better control of airways disease with exertion.

## 2021-10-25 NOTE — Assessment & Plan Note (Signed)
Counseled re importance of smoking cessation but did not meet time criteria for separate billing           Each maintenance medication was reviewed in detail including emphasizing most importantly the difference between maintenance and prns and under what circumstances the prns are to be triggered using an action plan format where appropriate.  Total time for H and P, chart review, counseling, reviewing hfa device(s) and generating customized AVS unique to this office visit / same day charting =  34 min

## 2021-10-25 NOTE — Progress Notes (Signed)
Laurie Mccoy, female    DOB: 1941/05/13     MRN: [id number]   Brief patient profile:  66 yowf active smoker prev worked with GP's  from [place] moved to [place] to live near her sons/grandchildren with onset around 1990 doe worse in hot/humid weather and with uri's much worse in winter of 2020 while on ACEi but blood pressure got so low and better since but wants to be cleared for umbilical surgery  in [place] wanted her cleared for 3rd surgery (original was for diverticulitis surgery) so referred to pulmonary clinic 03/10/2019 by Dr  Alvino Chapel.     History of Present Illness  03/10/2019  Pulmonary/ 1st office eval/Mallerie Blok  Off acei x early May 2020  Chief Complaint  Patient presents with   Pulmonary Consult    Referred by Dr. Charlane Ferretti for pulmonary clearance for hernia repair. Pt states she has had asthma "for a long time". She states having increased SOB since since she was sick in Feb 2020.   Dyspnea:  Slowed by feet > sob at present Cough: advair irritation mouth throat but really no cough since off acei  Sleep: on side electric bed just a little / cpap  SABA use: albuterol 4-5 mdi/ never neb anymore Singulair could not take bad dreams   rec The key is to stop smoking completely before smoking completely stops you! -For smoking cessation classes/info call [phone]    Plan A = Automatic = stop advair/ start Symbicort 160 Take 2 puffs first thing in am and then another 2 puffs about 12 hours later.  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 She is using her proair 5 x daily on average. She has decided that she wants to stop smoking and  plans to stop end of this wk.   Dyspnea:  No regular ex/ limited by feet hurting x 1.5 y Cough: none Sleeping: on cpap does fine, but when starts day is sob SABA use: never needs at rest  02: none  rec Plan A = Automatic = Trelegy one click each  - take smooth deep breaths x 2 to make sure you get it all  Rinse and gargle when done  The key is to stop smoking completely before smoking completely stops you! Plan B = Backup Only use your albuterol inhaler as a rescue medication Plan C = Crisis - only use your albuterol nebulizer if you first try Plan B  07/26/2019 eval at [facility] for atypical cp migratory / upper abd also  Listed as taking lisinopril but hasn't started it  Nl trop/ probnp / cxr  rec re start lisinopril See cards > scheduled for 07/30/19 and no cp since   07/28/2019  f/u ov/Dacota Ruben re: GOLD III copd / can't tol trelegy or symbicort so just using saba hfa tid Chief Complaint  Patient presents with   Follow-up  Dyspnea:  Still limited by feet/ able to walk  slow pace ok flat surfaces  MMRC2 = can't walk a nl pace on a flat grade s sob but does fine slow and flat  Cough:  none Sleeping: on cpap  SABA use: 3 x day hfa/ no neb needed  02: none  rec Don't take lisinopril  Micardis 40 mg one half daily up to 2 whole pills  daily goal is to keep bp under 135 and 90 on bottom       07/03/2020  f/u ov/Lucilla Petrenko re: GOLD III / still smoking, happy with  trelegy   until started with sevre cough x 7d > treleg seems to make it worse. Chief Complaint  Patient presents with   Follow-up   Dyspnea:  Feet slow her down  But aslong as walk slowly not limited by   sob and can even go up a flight of steps  Cough: none until one week prior with light brown related to bad air tree surgery/ UC rx shot  Sleeping: on cpap recalled > f/u Kelly planned SABA use: normally not using but now starts each am with saba to "open her up for the day" before can use her trelegy. 02: none   Rec Prednisone 10 mg take  4 each am x 2 days,   2 each am x 2 days,  1 each am x 2 days and stop  Zpak  Instead of Trelegy  try   Breztri Take 2 puffs first thing in am and then another 2 puffs about 12 hours later.  If you prefer it I would be happy to call it in for you Work on inhaler technique.   10/25/2021  f/u ov/Iann Rodier re: GOLD 3 copd / no cigs x 3 days maint on breztri though hfa not ideal  Chief Complaint  Patient presents with   Follow-up    Increased sob with exertion x 4 mths.  Dyspnea:  does fine push anywhere [place]  Cough: none  Sleeping: cpap/ head raised on pillows/ bed flat/ feels fine in ams SABA use: 4 puffs a day usually p ex  02: none  Covid status:  vax x 3 plus omicron Jan 2022    No obvious day to day or daytime variability or assoc excess/ purulent sputum or mucus plugs or hemoptysis or cp or chest tightness, subjective wheeze or overt sinus or hb symptoms.   Sleeping  without nocturnal  or early am exacerbation  of respiratory  c/o's or need for noct saba. Also denies any obvious fluctuation of symptoms with weather or environmental changes or other aggravating or alleviating factors except as outlined above   No unusual exposure hx or h/o childhood pna/ asthma or knowledge of premature birth.  Current Allergies, Complete Past Medical History, Past Surgical History, Family History, and Social History were reviewed in [employer] record.  ROS  The following are not active complaints unless bolded Hoarseness, sore throat, dysphagia, dental problems, itching, sneezing,  nasal congestion or discharge of excess mucus or purulent secretions, ear ache,   fever, chills, sweats, unintended wt loss or wt gain, classically pleuritic or exertional cp,  orthopnea pnd or arm/hand swelling  or leg swelling, presyncope, palpitations, abdominal pain, anorexia, nausea, vomiting, diarrhea  or change in bowel habits or change in bladder  habits, change in stools or change in urine, dysuria, hematuria,  rash, arthralgias, visual complaints, headache, numbness, weakness or ataxia or problems with walking or coordination,  change in mood or  memory.        Current Meds  Medication Sig   acetaminophen (TYLENOL) 500 MG tablet Take 500 mg by mouth every 6 (six) hours as needed  for moderate pain.   albuterol (PROVENTIL) (2.5 MG/3ML) 0.083% nebulizer solution Take 3 mLs (2.5 mg total) by nebulization every 4 (four) hours as needed for wheezing or shortness of breath.   albuterol (VENTOLIN HFA) 108 (90 Base) MCG/ACT inhaler Inhale 2 puffs into the lungs every 6 (six) hours as needed for wheezing or shortness of breath.   ALPRAZolam (XANAX) 0.25 MG tablet Take 0.25 mg by mouth 2 (two) times daily as needed for anxiety.   Budeson-Glycopyrrol-Formoterol (BREZTRI AEROSPHERE) 160-9-4.8 MCG/ACT AERO INHALE 2 PUFFS BY MOUTH EVERY MORNING AND EVERY NIGHT AT BEDTIME   famotidine (PEPCID) 20 MG tablet Take 20 mg by mouth at bedtime.   Homeopathic Products (LEG CRAMPS) TABS Take 2 tablets by mouth at bedtime as needed (leg cramps).   hydrochlorothiazide (HYDRODIURIL) 25 MG tablet Take 25 mg by mouth at bedtime.   Magnesium 400 MG CAPS Take 400-800 mg by mouth See admin instructions. Take 400 mg in the morning and 800 mg at night   nicotine (NICODERM CQ) 14 mg/24hr patch Place 1 patch (14 mg total) onto the skin daily.   OVER THE COUNTER MEDICATION Take 2 capsules by mouth in the morning and at bedtime. Healthy feet and nerves otc supplement   telmisartan (MICARDIS) 20 MG tablet Take 20 mg by mouth at bedtime.                                    Objective:       10/25/2021       201   07/03/2020      194   07/28/19 198 lb (89.8 kg)  05/10/19 197 lb 6.4 oz (89.5 kg)  04/12/19 195 lb 12.8 oz (88.8 kg)    Vital signs reviewed  10/25/2021  - Note at rest 02 sats  96% on RA   General appearance:    amb wf nad    HEENT : pt  wearing mask not removed for exam due to covid - 19 concerns.    NECK :  without JVD/Nodes/TM/ nl carotid upstrokes bilaterally   LUNGS: no acc muscle use,  Mild barrel  contour chest wall with bilateral  Distant bs s audible wheeze and  without cough on insp or exp maneuvers  and mild  Hyperresonant  to  percussion bilaterally     CV:  RRR  no s3 or murmur or increase in P2, and no edema   ABD:  soft and nontender with pos end  insp Hoover's  in the supine position. No bruits or organomegaly appreciated, bowel sounds nl  MS:   Nl gait/  ext warm without deformities, calf tenderness, cyanosis or clubbing No obvious joint restrictions   SKIN: warm and dry without lesions    NEURO:  alert, approp, nl sensorium with  no motor or cerebellar deficits apparent.            Assessment

## 2021-10-25 NOTE — Patient Instructions (Addendum)
Plan A = Automatic = Always=    Breztri  Take 2 puffs first thing in am and then another 2 puffs about 12 hours later.     Plan B = Backup (to supplement plan A, not to replace it) Only use your albuterol inhaler as a rescue medication to be used if you can't catch your breath by resting or doing a relaxed purse lip breathing pattern.  - The less you use it, the better it will work when you need it. - Ok to use the inhaler up to 2 puffs  every 4 hours if you must but call for appointment if use goes up over your usual need - Don't leave home without it !!  (think of it like starter fluid  for your car)    Ok to try albuterol 15 min before an activity (on alternating days)  that you know would usually make you short of breath and see if it makes any difference and if makes none then don't take albuterol after activity unless you can't catch your breath as this means it's the resting that helps, not the albuterol.   Plan C =  Crisis   more albuterol than usual >>> Prednisone 10 mg take  4 each am x 2 days,   2 each am x 2 days,  1 each am x 2 days and stop   Please schedule a follow up visit in 6  months but call sooner if needed

## 2021-12-11 ENCOUNTER — Other Ambulatory Visit: Payer: Self-pay | Admitting: Cardiovascular Disease

## 2022-01-07 ENCOUNTER — Encounter (HOSPITAL_BASED_OUTPATIENT_CLINIC_OR_DEPARTMENT_OTHER): Payer: Self-pay | Admitting: Cardiovascular Disease

## 2022-01-07 MED ORDER — TELMISARTAN 20 MG PO TABS: 20.0000 mg | ORAL_TABLET | Freq: Every day | ORAL | 1 refills | Status: DC

## 2022-02-13 ENCOUNTER — Emergency Department (HOSPITAL_COMMUNITY): Payer: Medicare Other

## 2022-02-13 ENCOUNTER — Observation Stay (HOSPITAL_COMMUNITY)
Admission: EM | Admit: 2022-02-13 | Discharge: 2022-02-14 | Disposition: A | Discharge status: Home / Self Care | Payer: Medicare Other | Attending: Internal Medicine | Admitting: Internal Medicine

## 2022-02-13 ENCOUNTER — Other Ambulatory Visit: Payer: Self-pay

## 2022-02-13 DIAGNOSIS — J449 Chronic obstructive pulmonary disease, unspecified: Secondary | ICD-10-CM | POA: Insufficient documentation

## 2022-02-13 DIAGNOSIS — J189 Pneumonia, unspecified organism: Principal | ICD-10-CM | POA: Diagnosis present

## 2022-02-13 DIAGNOSIS — J45909 Unspecified asthma, uncomplicated: Secondary | ICD-10-CM | POA: Diagnosis not present

## 2022-02-13 DIAGNOSIS — F17210 Nicotine dependence, cigarettes, uncomplicated: Secondary | ICD-10-CM | POA: Diagnosis not present

## 2022-02-13 DIAGNOSIS — Z79899 Other long term (current) drug therapy: Secondary | ICD-10-CM | POA: Diagnosis not present

## 2022-02-13 DIAGNOSIS — E119 Type 2 diabetes mellitus without complications: Secondary | ICD-10-CM | POA: Diagnosis not present

## 2022-02-13 DIAGNOSIS — R918 Other nonspecific abnormal finding of lung field: Secondary | ICD-10-CM | POA: Diagnosis present

## 2022-02-13 DIAGNOSIS — M549 Dorsalgia, unspecified: Secondary | ICD-10-CM | POA: Diagnosis present

## 2022-02-13 DIAGNOSIS — G4733 Obstructive sleep apnea (adult) (pediatric): Secondary | ICD-10-CM | POA: Diagnosis not present

## 2022-02-13 DIAGNOSIS — R911 Solitary pulmonary nodule: Secondary | ICD-10-CM | POA: Diagnosis not present

## 2022-02-13 DIAGNOSIS — Z20822 Contact with and (suspected) exposure to covid-19: Secondary | ICD-10-CM | POA: Insufficient documentation

## 2022-02-13 DIAGNOSIS — J441 Chronic obstructive pulmonary disease with (acute) exacerbation: Secondary | ICD-10-CM | POA: Diagnosis present

## 2022-02-13 DIAGNOSIS — I10 Essential (primary) hypertension: Secondary | ICD-10-CM | POA: Diagnosis not present

## 2022-02-13 LAB — I-STAT CHEM 8, ED
BUN: 23 mg/dL (ref 8–23)
Calcium, Ion: 1.09 mmol/L — ABNORMAL LOW (ref 1.15–1.40)
Chloride: 101 mmol/L (ref 98–111)
Creatinine, Ser: 0.7 mg/dL (ref 0.44–1.00)
Glucose, Bld: 106 mg/dL — ABNORMAL HIGH (ref 70–99)
HCT: 44 % (ref 36.0–46.0)
Hemoglobin: 15 g/dL (ref 12.0–15.0)
Potassium: 4.7 mmol/L (ref 3.5–5.1)
Sodium: 136 mmol/L (ref 135–145)
TCO2: 28 mmol/L (ref 22–32)

## 2022-02-13 LAB — CBC WITH DIFFERENTIAL/PLATELET
Abs Immature Granulocytes: 0.03 10*3/uL (ref 0.00–0.07)
Basophils Absolute: 0.1 10*3/uL (ref 0.0–0.1)
Basophils Relative: 1 %
Eosinophils Absolute: 0.1 10*3/uL (ref 0.0–0.5)
Eosinophils Relative: 1 %
HCT: 43.3 % (ref 36.0–46.0)
Hemoglobin: 14.5 g/dL (ref 12.0–15.0)
Immature Granulocytes: 0 %
Lymphocytes Relative: 15 %
Lymphs Abs: 1.5 10*3/uL (ref 0.7–4.0)
MCH: 31.9 pg (ref 26.0–34.0)
MCHC: 33.5 g/dL (ref 30.0–36.0)
MCV: 95.2 fL (ref 80.0–100.0)
Monocytes Absolute: 0.7 10*3/uL (ref 0.1–1.0)
Monocytes Relative: 7 %
Neutro Abs: 7.4 10*3/uL (ref 1.7–7.7)
Neutrophils Relative %: 76 %
Platelets: 297 10*3/uL (ref 150–400)
RBC: 4.55 MIL/uL (ref 3.87–5.11)
RDW: 12.3 % (ref 11.5–15.5)
WBC: 9.8 10*3/uL (ref 4.0–10.5)
nRBC: 0 % (ref 0.0–0.2)

## 2022-02-13 LAB — COMPREHENSIVE METABOLIC PANEL WITH GFR
ALT: 23 U/L (ref 0–44)
AST: 23 U/L (ref 15–41)
Albumin: 3.6 g/dL (ref 3.5–5.0)
Alkaline Phosphatase: 62 U/L (ref 38–126)
Anion gap: 8 (ref 5–15)
BUN: 18 mg/dL (ref 8–23)
CO2: 25 mmol/L (ref 22–32)
Calcium: 8.7 mg/dL — ABNORMAL LOW (ref 8.9–10.3)
Chloride: 104 mmol/L (ref 98–111)
Creatinine, Ser: 0.75 mg/dL (ref 0.44–1.00)
GFR, Estimated: >60 mL/min
Glucose, Bld: 111 mg/dL — ABNORMAL HIGH (ref 70–99)
Potassium: 5 mmol/L (ref 3.5–5.1)
Sodium: 137 mmol/L (ref 135–145)
Total Bilirubin: 1.1 mg/dL (ref 0.3–1.2)
Total Protein: 6.3 g/dL — ABNORMAL LOW (ref 6.5–8.1)

## 2022-02-13 LAB — TROPONIN I (HIGH SENSITIVITY)
Troponin I (High Sensitivity): 7 ng/L
Troponin I (High Sensitivity): 7 ng/L

## 2022-02-13 LAB — RESP PANEL BY RT-PCR (FLU A&B, COVID) ARPGX2
Influenza A by PCR: NEGATIVE
Influenza B by PCR: NEGATIVE
SARS Coronavirus 2 by RT PCR: NEGATIVE

## 2022-02-13 MED ORDER — ACETAMINOPHEN 325 MG PO TABS
650.0000 mg | ORAL_TABLET | Freq: Four times a day (QID) | ORAL | Status: DC | PRN
  Administered 2022-02-14: 650 mg via ORAL
  Filled 2022-02-13: qty 2

## 2022-02-13 MED ORDER — SODIUM CHLORIDE 0.9 % IV SOLN
1.0000 g | Freq: Once | INTRAVENOUS | Status: AC
  Administered 2022-02-13: 1 g via INTRAVENOUS
  Filled 2022-02-13: qty 10

## 2022-02-13 MED ORDER — DOCUSATE SODIUM 100 MG PO CAPS
100.0000 mg | ORAL_CAPSULE | Freq: Two times a day (BID) | ORAL | Status: DC
  Administered 2022-02-13 – 2022-02-14 (×2): 100 mg via ORAL
  Filled 2022-02-13 (×2): qty 1

## 2022-02-13 MED ORDER — HYDRALAZINE HCL 20 MG/ML IJ SOLN: 10.0000 mg | Freq: Four times a day (QID) | INTRAMUSCULAR | Status: DC | PRN

## 2022-02-13 MED ORDER — NICOTINE 14 MG/24HR TD PT24
14.0000 mg | MEDICATED_PATCH | Freq: Every day | TRANSDERMAL | Status: DC
  Administered 2022-02-13: 14 mg via TRANSDERMAL
  Filled 2022-02-13: qty 1

## 2022-02-13 MED ORDER — ACETAMINOPHEN 650 MG RE SUPP: 650.0000 mg | Freq: Four times a day (QID) | RECTAL | Status: DC | PRN

## 2022-02-13 MED ORDER — SODIUM CHLORIDE 0.9 % IV SOLN
2.0000 g | INTRAVENOUS | Status: DC
  Administered 2022-02-14: 2 g via INTRAVENOUS
  Filled 2022-02-13: qty 20

## 2022-02-13 MED ORDER — HEPARIN SODIUM (PORCINE) 5000 UNIT/ML IJ SOLN
5000.0000 [IU] | Freq: Three times a day (TID) | INTRAMUSCULAR | Status: DC
  Filled 2022-02-13: qty 1

## 2022-02-13 MED ORDER — HYDROCHLOROTHIAZIDE 25 MG PO TABS
25.0000 mg | ORAL_TABLET | Freq: Every day | ORAL | Status: DC
  Administered 2022-02-13: 25 mg via ORAL
  Filled 2022-02-13 (×2): qty 1

## 2022-02-13 MED ORDER — AZITHROMYCIN 250 MG PO TABS
500.0000 mg | ORAL_TABLET | Freq: Every day | ORAL | Status: DC
  Administered 2022-02-14: 500 mg via ORAL
  Filled 2022-02-13: qty 2

## 2022-02-13 MED ORDER — SODIUM CHLORIDE 0.9 % IV SOLN: 2.0000 g | INTRAVENOUS | Status: DC

## 2022-02-13 MED ORDER — ALPRAZOLAM 0.25 MG PO TABS: 0.2500 mg | ORAL_TABLET | Freq: Two times a day (BID) | ORAL | Status: DC | PRN

## 2022-02-13 MED ORDER — FENTANYL CITRATE PF 50 MCG/ML IJ SOSY
50.0000 ug | PREFILLED_SYRINGE | Freq: Once | INTRAMUSCULAR | Status: AC
  Administered 2022-02-13: 50 ug via INTRAVENOUS
  Filled 2022-02-13: qty 1

## 2022-02-13 MED ORDER — SODIUM CHLORIDE 0.9 % IV SOLN
500.0000 mg | Freq: Once | INTRAVENOUS | Status: AC
  Administered 2022-02-13: 500 mg via INTRAVENOUS
  Filled 2022-02-13: qty 5

## 2022-02-13 MED ORDER — ONDANSETRON HCL 4 MG/2ML IJ SOLN: 4.0000 mg | Freq: Four times a day (QID) | INTRAMUSCULAR | Status: DC | PRN

## 2022-02-13 MED ORDER — FLUTICASONE FUROATE-VILANTEROL 100-25 MCG/ACT IN AEPB
1.0000 | INHALATION_SPRAY | Freq: Every day | RESPIRATORY_TRACT | Status: DC
  Administered 2022-02-14: 1 via RESPIRATORY_TRACT
  Filled 2022-02-13: qty 28

## 2022-02-13 MED ORDER — BUDESON-GLYCOPYRROL-FORMOTEROL 160-9-4.8 MCG/ACT IN AERO: 2.0000 | INHALATION_SPRAY | Freq: Two times a day (BID) | RESPIRATORY_TRACT | Status: DC

## 2022-02-13 MED ORDER — FAMOTIDINE 20 MG PO TABS
20.0000 mg | ORAL_TABLET | Freq: Every day | ORAL | Status: DC
  Administered 2022-02-13: 20 mg via ORAL
  Filled 2022-02-13: qty 1

## 2022-02-13 MED ORDER — UMECLIDINIUM BROMIDE 62.5 MCG/ACT IN AEPB
1.0000 | INHALATION_SPRAY | Freq: Every day | RESPIRATORY_TRACT | Status: DC
  Administered 2022-02-14: 1 via RESPIRATORY_TRACT
  Filled 2022-02-13: qty 7

## 2022-02-13 MED ORDER — IRBESARTAN 75 MG PO TABS
75.0000 mg | ORAL_TABLET | Freq: Every day | ORAL | Status: DC
  Administered 2022-02-13: 75 mg via ORAL
  Filled 2022-02-13 (×2): qty 1

## 2022-02-13 MED ORDER — ONDANSETRON HCL 4 MG PO TABS
4.0000 mg | ORAL_TABLET | Freq: Four times a day (QID) | ORAL | Status: DC | PRN
Start: 2022-02-13 — End: 2022-02-14

## 2022-02-13 MED ORDER — IOHEXOL 350 MG/ML SOLN
100.0000 mL | Freq: Once | INTRAVENOUS | Status: AC | PRN
  Administered 2022-02-13: 100 mL via INTRAVENOUS

## 2022-02-13 MED ORDER — OXYCODONE HCL 5 MG PO TABS
5.0000 mg | ORAL_TABLET | ORAL | Status: DC | PRN
  Filled 2022-02-13: qty 1

## 2022-02-13 MED ORDER — ALBUTEROL SULFATE (2.5 MG/3ML) 0.083% IN NEBU
2.5000 mg | INHALATION_SOLUTION | RESPIRATORY_TRACT | Status: DC | PRN
  Administered 2022-02-13: 2.5 mg via RESPIRATORY_TRACT
  Filled 2022-02-13: qty 3

## 2022-02-13 NOTE — Assessment & Plan Note (Signed)
Patient with history of pulmonary nodules.  Her CT chest today does demonstrate a rounded like mass within the pneumonia.  Discussed with her that she will need a repeat CT for follow-up.  Discussed with her that this potentially could be a lung mass that is hidden within a pneumonia.  Discussed that she needs to call her pulmonologist to arrange for repeat CT about a month after she finishes antibiotics for her pneumonia. ?

## 2022-02-13 NOTE — ED Triage Notes (Signed)
Pt bib [facility] for sudden onset back pain that radiated to the front. No n/v, diaphoresis, or SOB. Pt also c/o epigastric pain. ? ?EMS vitals ?190/80BP -> 160/80  ?85 HR ?95% O2 ?91 CBG ?20g Lt AC ?42mcg fent  ?

## 2022-02-13 NOTE — Assessment & Plan Note (Signed)
Stable.  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 ?

## 2022-02-13 NOTE — Assessment & Plan Note (Signed)
Patient continues to smoke half a pack a day.  Continue with as needed NicoDerm patch. ?

## 2022-02-13 NOTE — ED Notes (Signed)
Pt given [place] sandwich and a drink. ?

## 2022-02-13 NOTE — Subjective & Objective (Signed)
Chief complaint: Shortness of breath, weakness, cough, left flank pain. ? ?History present illness: ?81 year old female history of hypertension, COPD, chronic tobacco abuse, history of lung nodules presents to the ER today with a 1 week history of fatigue.  She states that she was playing bridge last Monday.  She started feeling poorly on Tuesday.  She states that she started having conjunctivitis on Tuesday.  This progressed over the last several days to having a low-grade fever shortness of breath that started on the weekend.  She had a nonproductive cough.  Denies any fever.  No nausea, vomiting or diarrhea.  She started having left-sided flank pain today which prompted a visit to the ER. ? ?On arrival to ER, temp 98.6 heart rate 80 blood pressure 171/48. ? ?Patient required IV fentanyl due to the pain in her chest. ? ?CTPA was negative for PE.  There is a focal consolidation in the lingula. ? ?Due to continued pain in her chest from her pneumonia, [facility] hospitalist contacted for admission. ?

## 2022-02-13 NOTE — H&P (Signed)
? ?History and Physical  ? ? ?[name] SWN:[id number] DOB: 01-Sep-1941 DOA: 02/13/2022 ? ?DOS: the patient was seen and examined on 02/13/2022 ? ?PCP: [name], NP  ? ?Patient coming from: Home ? ?I have personally briefly reviewed patient's old medical records in [place] ? ?Chief complaint: Shortness of breath, weakness, cough, left flank pain. ? ?History present illness: ?81 year old female history of hypertension, COPD, chronic tobacco abuse, history of lung nodules presents to the ER today with a 1 week history of fatigue.  She states that she was playing bridge last Monday.  She started feeling poorly on Tuesday.  She states that she started having conjunctivitis on Tuesday.  This progressed over the last several days to having a low-grade fever shortness of breath that started on the weekend.  She had a nonproductive cough.  Denies any fever.  No nausea, vomiting or diarrhea.  She started having left-sided flank pain today which prompted a visit to the ER. ? ?On arrival to ER, temp 98.6 heart rate 80 blood pressure 171/48. ? ?Patient required IV fentanyl due to the pain in her chest. ? ?CTPA was negative for PE.  There is a focal consolidation in the lingula. ? ?Due to continued pain in her chest from her pneumonia, [facility] hospitalist contacted for admission.  ? ?ED Course: CTPA shows Lingular pneumonia. ? ?Review of Systems:  ?Review of Systems  ?Constitutional:  Positive for malaise/fatigue. Negative for diaphoresis and fever.  ?HENT: Negative.    ?Eyes:  Positive for discharge and redness.  ?Respiratory:  Positive for cough, shortness of breath and wheezing.   ?Cardiovascular:  Negative for PND.  ?Gastrointestinal:  Negative for abdominal pain, diarrhea, heartburn, nausea and vomiting.  ?Genitourinary:  Positive for flank pain.  ?Skin: Negative.   ?Neurological: Negative.   ?Endo/Heme/Allergies: Negative.   ?Psychiatric/Behavioral: Negative.    ?All other systems reviewed and are  negative. ? ?Past Medical History:  ?Diagnosis Date  ? Asthma   ? Hypertension   ? Sleep apnea   ? ? ?Past Surgical History:  ?Procedure Laterality Date  ? APPENDECTOMY    ? BRONCHIAL BIOPSY  06/12/2021  ? Procedure: BRONCHIAL BIOPSIES;  Surgeon: Garner Nash, DO;  Location: [facility];  Service: Pulmonary;;  ? BRONCHIAL BRUSHINGS  06/12/2021  ? Procedure: BRONCHIAL BRUSHINGS;  Surgeon: Garner Nash, DO;  Location: [facility];  Service: Pulmonary;;  ? BRONCHIAL WASHINGS  06/12/2021  ? Procedure: BRONCHIAL WASHINGS;  Surgeon: Garner Nash, DO;  Location: [facility];  Service: Pulmonary;;  ? CHOLECYSTECTOMY    ? COLON RESECTION    ? FIDUCIAL MARKER PLACEMENT  06/12/2021  ? Procedure: FIDUCIAL MARKER PLACEMENT;  Surgeon: Garner Nash, DO;  Location: [facility];  Service: Pulmonary;;  ? hernia    ? TONSILLECTOMY    ? VIDEO BRONCHOSCOPY WITH ENDOBRONCHIAL NAVIGATION Bilateral 06/12/2021  ? Procedure: VIDEO BRONCHOSCOPY WITH ENDOBRONCHIAL NAVIGATION;  Surgeon: Garner Nash, DO;  Location: [facility];  Service: Pulmonary;  Laterality: Bilateral;  ION  ? VIDEO BRONCHOSCOPY WITH RADIAL ENDOBRONCHIAL ULTRASOUND  06/12/2021  ? Procedure: RADIAL ENDOBRONCHIAL ULTRASOUND;  Surgeon: Garner Nash, DO;  Location: [facility];  Service: Pulmonary;;  ? ? ? reports that she has been smoking cigarettes. She has a 23.00 pack-year smoking history. She has never used smokeless tobacco. She reports that she does not currently use alcohol. She reports that she does not use drugs. ? ?Allergies  ?Allergen Reactions  ? Amoxicillin   ?  itching  ?  Compazine [Prochlorperazine]   ?  Lose muscle control in face   ? Norvasc [Amlodipine] Swelling  ?  Lower extremity swelling  ?  ? Trelegy Ellipta [Fluticasone-Umeclidin-Vilant] Swelling  ?  Swelling in legs and headache.  ? ? ?Family History  ?Problem Relation Age of Onset  ? Lung cancer Mother   ?     never smoker  ? Brain cancer Mother   ? Lung cancer Father   ?     smoked  ?  Stroke Brother   ? ? ?Prior to Admission medications   ?Medication Sig Start Date End Date Taking? Authorizing Provider  ?acetaminophen (TYLENOL) 500 MG tablet Take 500 mg by mouth every 6 (six) hours as needed for moderate pain.    [provider]  ?albuterol (PROVENTIL) (2.5 MG/3ML) 0.083% nebulizer solution Take 3 mLs (2.5 mg total) by nebulization every 4 (four) hours as needed for wheezing or shortness of breath. 04/13/19   Tanda Rockers, MD  ?albuterol (VENTOLIN HFA) 108 (90 Base) MCG/ACT inhaler Inhale 2 puffs into the lungs every 6 (six) hours as needed for wheezing or shortness of breath. 10/18/19   Tanda Rockers, MD  ?ALPRAZolam Duanne Moron) 0.25 MG tablet Take 0.25 mg by mouth 2 (two) times daily as needed for anxiety.    [provider]  ?Budeson-Glycopyrrol-Formoterol (BREZTRI AEROSPHERE) 160-9-4.8 MCG/ACT AERO INHALE 2 PUFFS BY MOUTH EVERY MORNING AND EVERY NIGHT AT BEDTIME 10/25/21   Tanda Rockers, MD  ?Cyanocobalamin 5000 MCG/ML LIQD Place 5,000 mcg under the tongue daily.    [provider]  ?famotidine (PEPCID) 20 MG tablet Take 20 mg by mouth at bedtime.    [provider]  ?Homeopathic Products (LEG CRAMPS) TABS Take 2 tablets by mouth at bedtime as needed (leg cramps).    [provider]  ?hydrochlorothiazide (HYDRODIURIL) 25 MG tablet Take 25 mg by mouth at bedtime. 04/25/20   [provider]  ?Magnesium 400 MG CAPS Take 400-800 mg by mouth See admin instructions. Take 400 mg in the morning and 800 mg at night    [provider]  ?nicotine (NICODERM CQ) 14 mg/24hr patch Place 1 patch (14 mg total) onto the skin daily. 03/01/20   Lauraine Rinne, NP  ?OVER THE COUNTER MEDICATION Take 2 capsules by mouth in the morning and at bedtime. Healthy feet and nerves otc supplement    [provider]  ?predniSONE (DELTASONE) 10 MG tablet Take  4 each am x 2 days,   2 each am x 2 days,  1 each am x 2 days and stop 10/25/21   Tanda Rockers,  MD  ?telmisartan (MICARDIS) 20 MG tablet Take 1 tablet (20 mg total) by mouth at bedtime. 01/07/22   O'Neal, Cassie Freer, MD  ? ? ?Physical Exam: ?Vitals:  ? 02/13/22 1715 02/13/22 1726 02/13/22 1729 02/13/22 1845  ?BP: (!) 169/71   (!) 115/47  ?Pulse: 87   81  ?Resp: 19   (!) 23  ?Temp:  98.6 ?F (37 ?C)    ?TempSrc:  Oral    ?SpO2: 94%   93%  ?Weight:   84.8 kg   ?Height:   5\' 6"  (1.676 m)   ? ? ?Physical Exam ?Vitals and nursing note reviewed.  ?Constitutional:   ?   General: She is not in acute distress. ?   Appearance: Normal appearance. She is not ill-appearing, toxic-appearing or diaphoretic.  ?HENT:  ?   Head: Normocephalic and atraumatic.  ?  Nose: Nose normal. No rhinorrhea.  ?Eyes:  ?   Conjunctiva/sclera:  ?   Right eye: Right conjunctiva is injected.  ?   Left eye: Left conjunctiva is injected.  ?Cardiovascular:  ?   Rate and Rhythm: Normal rate and regular rhythm.  ?   Pulses: Normal pulses.  ?Pulmonary:  ?   Breath sounds: Examination of the left-middle field reveals wheezing and rales. Wheezing and rales present. No decreased breath sounds or rhonchi.  ?Abdominal:  ?   General: Abdomen is flat. Bowel sounds are normal.  ?   Palpations: Abdomen is soft.  ?   Tenderness: There is no guarding or rebound.  ?   Comments: Ventral hernia  ?Musculoskeletal:  ?   Right lower leg: No edema.  ?   Left lower leg: No edema.  ?Skin: ?   General: Skin is warm and dry.  ?   Capillary Refill: Capillary refill takes less than 2 seconds.  ?Neurological:  ?   General: No focal deficit present.  ?   Mental Status: She is alert and oriented to person, place, and time.  ?  ? ?Labs on Admission: I have personally reviewed following labs and imaging studies ? ?CBC: ?Recent Labs  ?Lab 02/13/22 ?1720 02/13/22 ?1728  ?WBC 9.8  --   ?NEUTROABS 7.4  --   ?HGB 14.5 15.0  ?HCT 43.3 44.0  ?MCV 95.2  --   ?PLT 297  --   ? ?Basic Metabolic Panel: ?Recent Labs  ?Lab 02/13/22 ?1720 02/13/22 ?1728  ?NA 137 136  ?K 5.0 4.7  ?CL 104 101   ?CO2 25  --   ?GLUCOSE 111* 106*  ?BUN 18 23  ?CREATININE 0.75 0.70  ?CALCIUM 8.7*  --   ? ?GFR: ?Estimated Creatinine Clearance: 62.6 mL/min (by C-G formula based on SCr of 0.7 mg/dL). ?Liver Function Tests:

## 2022-02-13 NOTE — Assessment & Plan Note (Signed)
Assigned observation status.  Continue IV Rocephin 1 g daily, IV Zithromax 500 mg daily.  Given the patient's conjunctivitis, will need to rule out COVID.  Check Legionella and strep pneumo antigen.  Repeat CBC, BMP in the morning. ?

## 2022-02-13 NOTE — Assessment & Plan Note (Signed)
Mildly exacerbated.  We will hold off on steroids for now.  Continue with DuoNebs every 6 hours. ?

## 2022-02-13 NOTE — Assessment & Plan Note (Signed)
Stable.  Uses CPAP at night. ?

## 2022-02-13 NOTE — ED Provider Notes (Signed)
?[facility] ?Provider Note ? ?CSN: [id number] ?Arrival date & time: 02/13/22 1641 ? ?Chief Complaint(s) ?Chest Pain and Back Pain ? ?HPI ?Laurie Mccoy is a 81 y.o. female with PMH COPD, HTN, thyroid nodules, tobacco abuse who presents emergency department for evaluation of back pain.  Patient states that she was sitting in her recliner when she had sudden onset pain between the scapula.  She denies any recent heavy lifting or trauma to the back.  She states that the pain is worse with deep breaths and any movement of the back.  She is endorsing some mild associated chest pain but denies diaphoresis, nausea, vomiting, numbness, tingling, weakness or other systemic symptoms.  With EMS, patient transitioned from normal sinus rhythm to a bigeminy pattern. ? ? ?Past Medical History ?Past Medical History:  ?Diagnosis Date  ? Asthma   ? Hypertension   ? Sleep apnea   ? ?Patient Active Problem List  ? Diagnosis Date Noted  ? Medication management 11/01/2019  ? Multiple pulmonary nodules determined by computed tomography of lung 10/06/2019  ? Thyroid nodule 10/06/2019  ? Essential hypertension 07/29/2019  ? COPD GOLD 3  03/10/2019  ? Ventral hernia, recurrent 03/10/2019  ? Cigarette smoker 03/10/2019  ? ?Home Medication(s) ?Prior to Admission medications   ?Medication Sig Start Date End Date Taking? Authorizing Provider  ?acetaminophen (TYLENOL) 500 MG tablet Take 500 mg by mouth every 6 (six) hours as needed for moderate pain.    [provider]  ?albuterol (PROVENTIL) (2.5 MG/3ML) 0.083% nebulizer solution Take 3 mLs (2.5 mg total) by nebulization every 4 (four) hours as needed for wheezing or shortness of breath. 04/13/19   Tanda Rockers, MD  ?albuterol (VENTOLIN HFA) 108 (90 Base) MCG/ACT inhaler Inhale 2 puffs into the lungs every 6 (six) hours as needed for wheezing or shortness of breath. 10/18/19   Tanda Rockers, MD  ?ALPRAZolam Duanne Moron) 0.25 MG tablet Take 0.25 mg  by mouth 2 (two) times daily as needed for anxiety.    [provider]  ?Budeson-Glycopyrrol-Formoterol (BREZTRI AEROSPHERE) 160-9-4.8 MCG/ACT AERO INHALE 2 PUFFS BY MOUTH EVERY MORNING AND EVERY NIGHT AT BEDTIME 10/25/21   Tanda Rockers, MD  ?Cyanocobalamin 5000 MCG/ML LIQD Place 5,000 mcg under the tongue daily.    [provider]  ?famotidine (PEPCID) 20 MG tablet Take 20 mg by mouth at bedtime.    [provider]  ?Homeopathic Products (LEG CRAMPS) TABS Take 2 tablets by mouth at bedtime as needed (leg cramps).    [provider]  ?hydrochlorothiazide (HYDRODIURIL) 25 MG tablet Take 25 mg by mouth at bedtime. 04/25/20   [provider]  ?Magnesium 400 MG CAPS Take 400-800 mg by mouth See admin instructions. Take 400 mg in the morning and 800 mg at night    [provider]  ?nicotine (NICODERM CQ) 14 mg/24hr patch Place 1 patch (14 mg total) onto the skin daily. 03/01/20   Lauraine Rinne, NP  ?OVER THE COUNTER MEDICATION Take 2 capsules by mouth in the morning and at bedtime. Healthy feet and nerves otc supplement    [provider]  ?predniSONE (DELTASONE) 10 MG tablet Take  4 each am x 2 days,   2 each am x 2 days,  1 each am x 2 days and stop 10/25/21   Tanda Rockers, MD  ?telmisartan (MICARDIS) 20 MG tablet Take 1 tablet (20 mg total) by mouth at bedtime. 01/07/22   O'Neal, Cassie Freer,  MD  ?                                                                                                                                  ?Past Surgical History ?Past Surgical History:  ?Procedure Laterality Date  ? APPENDECTOMY    ? BRONCHIAL BIOPSY  06/12/2021  ? Procedure: BRONCHIAL BIOPSIES;  Surgeon: Garner Nash, DO;  Location: [facility];  Service: Pulmonary;;  ? BRONCHIAL BRUSHINGS  06/12/2021  ? Procedure: BRONCHIAL BRUSHINGS;  Surgeon: Garner Nash, DO;  Location: [facility];  Service: Pulmonary;;  ? BRONCHIAL WASHINGS  06/12/2021  ? Procedure:  BRONCHIAL WASHINGS;  Surgeon: Garner Nash, DO;  Location: [facility];  Service: Pulmonary;;  ? CHOLECYSTECTOMY    ? COLON RESECTION    ? FIDUCIAL MARKER PLACEMENT  06/12/2021  ? Procedure: FIDUCIAL MARKER PLACEMENT;  Surgeon: Garner Nash, DO;  Location: [facility];  Service: Pulmonary;;  ? hernia    ? TONSILLECTOMY    ? VIDEO BRONCHOSCOPY WITH ENDOBRONCHIAL NAVIGATION Bilateral 06/12/2021  ? Procedure: VIDEO BRONCHOSCOPY WITH ENDOBRONCHIAL NAVIGATION;  Surgeon: Garner Nash, DO;  Location: [facility];  Service: Pulmonary;  Laterality: Bilateral;  ION  ? VIDEO BRONCHOSCOPY WITH RADIAL ENDOBRONCHIAL ULTRASOUND  06/12/2021  ? Procedure: RADIAL ENDOBRONCHIAL ULTRASOUND;  Surgeon: Garner Nash, DO;  Location: [facility];  Service: Pulmonary;;  ? ?Family History ?Family History  ?Problem Relation Age of Onset  ? Lung cancer Mother   ?     never smoker  ? Brain cancer Mother   ? Lung cancer Father   ?     smoked  ? Stroke Brother   ? ? ?Social History ?Social History  ? ?Tobacco Use  ? Smoking status: Some Days  ?  Packs/day: 0.50  ?  Years: 46.00  ?  Pack years: 23.00  ?  Types: Cigarettes  ?  Last attempt to quit: 02/27/2020  ?  Years since quitting: 1.9  ? Smokeless tobacco: Never  ? Tobacco comments:  ?  no cigs in 3 days  ?Vaping Use  ? Vaping Use: Never used  ?Substance Use Topics  ? Alcohol use: Not Currently  ? Drug use: Never  ? ?Allergies ?Amoxicillin, Compazine [prochlorperazine], Norvasc [amlodipine], and Trelegy ellipta [fluticasone-umeclidin-vilant] ? ?Review of Systems ?Review of Systems  ?Respiratory:  Positive for shortness of breath.   ?Cardiovascular:  Positive for chest pain.  ?Musculoskeletal:  Positive for back pain.  ? ?Physical Exam ?Vital Signs  ?I have reviewed the triage vital signs ?BP (!) 169/71   Pulse 87   Temp 98.6 ?F (37 ?C) (Oral)   Resp 19   Ht 5\' 6"  (1.676 m)   Wt 84.8 kg   SpO2 94%   BMI 30.18 kg/m?  ? ?Physical Exam ?Vitals and nursing note reviewed.   ?Constitutional:   ?   General: She is not in acute distress. ?   Appearance: She  is well-developed.  ?HENT:  ?   Head: Normocephalic and atraumatic.  ?Eyes:  ?   Conjunctiva/sclera: Conjunctivae normal.  ?Cardiovascular:  ?   Rate and Rhythm: Normal rate and regular rhythm.  ?   Heart sounds: No murmur heard. ?Pulmonary:  ?   Effort: Pulmonary effort is normal. No respiratory distress.  ?   Breath sounds: Normal breath sounds.  ?Abdominal:  ?   Palpations: Abdomen is soft.  ?   Tenderness: There is no abdominal tenderness.  ?Musculoskeletal:     ?   General: Tenderness present. No swelling.  ?   Cervical back: Neck supple.  ?Skin: ?   General: Skin is warm and dry.  ?   Capillary Refill: Capillary refill takes less than 2 seconds.  ?Neurological:  ?   Mental Status: She is alert.  ?Psychiatric:     ?   Mood and Affect: Mood normal.  ? ? ?ED Results and Treatments ?Labs ?(all labs ordered are listed, but only abnormal results are displayed) ?Labs Reviewed  ?COMPREHENSIVE METABOLIC PANEL - Abnormal; Notable for the following components:  ?    Result Value  ? Glucose, Bld 111 (*)   ? Calcium 8.7 (*)   ? Total Protein 6.3 (*)   ? All other components within normal limits  ?I-STAT CHEM 8, ED - Abnormal; Notable for the following components:  ? Glucose, Bld 106 (*)   ? Calcium, Ion 1.09 (*)   ? All other components within normal limits  ?CBC WITH DIFFERENTIAL/PLATELET  ?TROPONIN I (HIGH SENSITIVITY)  ?TROPONIN I (HIGH SENSITIVITY)  ?                                                                                                                       ? ?Radiology ?CT Angio Chest PE W and/or Wo Contrast ? ?Result Date: 02/13/2022 ?CLINICAL DATA:  Pulmonary embolism (PE) suspected, high prob. EXAM: CT ANGIOGRAPHY CHEST WITH CONTRAST TECHNIQUE: Multidetector CT imaging of the chest was performed using the standard protocol during bolus administration of intravenous contrast. Multiplanar CT image reconstructions and MIPs were  obtained to evaluate the vascular anatomy. RADIATION DOSE REDUCTION: This exam was performed according to the departmental dose-optimization program which includes automated exposure control, adjustment of the mA and/or kV

## 2022-02-14 ENCOUNTER — Ambulatory Visit (HOSPITAL_BASED_OUTPATIENT_CLINIC_OR_DEPARTMENT_OTHER): Admission: RE | Admit: 2022-02-14 | Payer: Medicare Other | Source: Ambulatory Visit

## 2022-02-14 ENCOUNTER — Telehealth: Payer: Self-pay | Admitting: Pulmonary Disease

## 2022-02-14 DIAGNOSIS — I10 Essential (primary) hypertension: Secondary | ICD-10-CM | POA: Diagnosis not present

## 2022-02-14 DIAGNOSIS — J449 Chronic obstructive pulmonary disease, unspecified: Secondary | ICD-10-CM | POA: Diagnosis not present

## 2022-02-14 DIAGNOSIS — F17210 Nicotine dependence, cigarettes, uncomplicated: Secondary | ICD-10-CM | POA: Diagnosis not present

## 2022-02-14 DIAGNOSIS — J189 Pneumonia, unspecified organism: Secondary | ICD-10-CM | POA: Diagnosis not present

## 2022-02-14 LAB — CBC WITH DIFFERENTIAL/PLATELET
Abs Immature Granulocytes: 0.03 10*3/uL (ref 0.00–0.07)
Basophils Absolute: 0.1 10*3/uL (ref 0.0–0.1)
Basophils Relative: 1 %
Eosinophils Absolute: 0.1 10*3/uL (ref 0.0–0.5)
Eosinophils Relative: 1 %
HCT: 42.2 % (ref 36.0–46.0)
Hemoglobin: 13.6 g/dL (ref 12.0–15.0)
Immature Granulocytes: 0 %
Lymphocytes Relative: 22 %
Lymphs Abs: 2.1 10*3/uL (ref 0.7–4.0)
MCH: 31.3 pg (ref 26.0–34.0)
MCHC: 32.2 g/dL (ref 30.0–36.0)
MCV: 97 fL (ref 80.0–100.0)
Monocytes Absolute: 0.8 10*3/uL (ref 0.1–1.0)
Monocytes Relative: 8 %
Neutro Abs: 6.3 10*3/uL (ref 1.7–7.7)
Neutrophils Relative %: 68 %
Platelets: 281 10*3/uL (ref 150–400)
RBC: 4.35 MIL/uL (ref 3.87–5.11)
RDW: 12.2 % (ref 11.5–15.5)
WBC: 9.4 10*3/uL (ref 4.0–10.5)
nRBC: 0 % (ref 0.0–0.2)

## 2022-02-14 LAB — COMPREHENSIVE METABOLIC PANEL
ALT: 14 U/L (ref 0–44)
AST: 12 U/L — ABNORMAL LOW (ref 15–41)
Albumin: 3.2 g/dL — ABNORMAL LOW (ref 3.5–5.0)
Alkaline Phosphatase: 63 U/L (ref 38–126)
Anion gap: 6 (ref 5–15)
BUN: 12 mg/dL (ref 8–23)
CO2: 24 mmol/L (ref 22–32)
Calcium: 8.6 mg/dL — ABNORMAL LOW (ref 8.9–10.3)
Chloride: 106 mmol/L (ref 98–111)
Creatinine, Ser: 0.62 mg/dL (ref 0.44–1.00)
GFR, Estimated: >60 mL/min (ref >60)
Glucose, Bld: 102 mg/dL — ABNORMAL HIGH (ref 70–99)
Potassium: 4 mmol/L (ref 3.5–5.1)
Sodium: 136 mmol/L (ref 135–145)
Total Bilirubin: 0.6 mg/dL (ref 0.3–1.2)
Total Protein: 5.8 g/dL — ABNORMAL LOW (ref 6.5–8.1)

## 2022-02-14 LAB — MAGNESIUM: Magnesium: 2 mg/dL (ref 1.7–2.4)

## 2022-02-14 MED ORDER — AZITHROMYCIN 500 MG PO TABS
500.0000 mg | ORAL_TABLET | Freq: Every day | ORAL | 0 refills | Status: AC
Start: 2022-02-15 — End: 2022-02-19

## 2022-02-14 MED ORDER — CEFDINIR 300 MG PO CAPS: 300.0000 mg | ORAL_CAPSULE | Freq: Two times a day (BID) | ORAL | 0 refills | Status: AC

## 2022-02-14 MED ORDER — TRAMADOL HCL 50 MG PO TABS
50.0000 mg | ORAL_TABLET | Freq: Four times a day (QID) | ORAL | 0 refills | Status: AC | PRN
Start: 2022-02-14 — End: 2022-02-19

## 2022-02-14 NOTE — Progress Notes (Signed)
Social visit: case discussed with [facility] who will arrange close f/u with patient.  Sympathy and reassurance provided.  Appreciate [facility] care. ? ?Erskine Emery MD PCCM ?

## 2022-02-14 NOTE — ED Notes (Signed)
Contacted patient placement to pull bed assignment due to MD and patient agreeing that she will be discharged to home  ?

## 2022-02-14 NOTE — Telephone Encounter (Signed)
PCCM: ? ?Called and spoke with patient just to see how she is doing after recent hospitalization for pneumonia.  She was discharged from the ER today. ? ?She needs an appointment to see APP or Dr. Melvyn Novas in clinic in 6 weeks.  She was unable to get a repeat super D CT scan for the follow-up of her nodule.  She needs to have a repeat CT scan of the chest in 6 weeks to make sure her pneumonia clears will also give [place] a chance to look at her nodules closely. ? ?Garner Nash, DO ?[facility] ?02/14/2022 5:17 PM   ? ?

## 2022-02-14 NOTE — ED Notes (Signed)
?   02/14/22 0813 02/14/22 4270 02/14/22 0815  ?Oxygen Therapy  ?SpO2 93 % 90 % 93 %  ? ? Room air therapy  ?

## 2022-02-14 NOTE — Discharge Summary (Signed)
?Physician Discharge Summary  ?Laurie Mccoy WUJ:[id number] DOB: [birth year] DOA: 02/13/2022 ? ?PCP: [name], NP ? ?Admit date: 02/13/2022 ?Discharge date: 02/14/2022 ? ?Admitted From: Home ?Disposition: Home ? ?Recommendations for Outpatient Follow-up:  ?Follow up with PCP in 1-2 weeks ?Continue azithromycin and cefdinir for treatment of community-acquired pneumonia ? ?Home Health: No ?Equipment/Devices: None ? ?Discharge Condition: Stable ?CODE STATUS: Full code ?Diet recommendation: Heart healthy/consistent carb regular diet ? ?History of present illness: ? ?Laurie Mccoy is a 81 year old female with past medical history significant for essential hypertension, COPD, history of lung nodules, type 2 diabetes mellitus, chronic tobacco abuse who presented to [facility] ED on 5/10 via EMS for complaints of back pain.  Also reports 1 week history of fatigue and started feeling poorly on Tuesday.  This then progressed to having a low-grade fever and shortness of breath over the weekend and nonproductive cough.  No nausea/vomiting/diarrhea. ? ?In the ED, temperature 98.6 ?F, HR 80, RR 16, BP 171/48, SPO2 93% on room air.  Sodium 137, potassium 5.0, chloride 104, CO2 25, glucose 111, BUN 18, creatinine 0.75.  WBC 9.8, hemoglobin 14.5, platelets 297.  COVID-19 PCR negative.  Influenza A/B PCR negative.  High sensitive troponin 7> 7.  Chest x-ray with focal opacity within the left midlung zone.  CT angiogram chest negative for pulmonary embolism but with focal consolidation within a lingula likely infectious, slight interval increase in size of mixed solid/cystic nodule right upper lobe.  Due to her continued pain in her chest from pneumonia, [facility] hospitalist consulted for admission. ? ? ?Hospital course: ? ?Community-acquired pneumonia ?Patient presenting to ED with back pain/chest discomfort, fatigue, progressive shortness of breath associated with nonproductive cough with mild fever at home.  On arrival to the ED,  patient was afebrile without leukocytosis, oxygenating adequately on room air.  High sensitive troponin negative x2, EKG with normal sinus rhythm.  CT angiogram chest negative for pulmonary embolism; but notable for focal consolidation within the lingula consistent with pneumonia.  Patient was initially started on IV azithromycin and ceftriaxone.  Ambulatory O2 screening performed with no desaturation at time of discharge.  Will discharge home on azithromycin to complete 5-day course and cefdinir to complete 7-day course. ? ?Pulmonary nodules ?With history of pulmonary nodules, follows with Dr. Valeta Harms outpatient.  CT angiogram chest on admission demonstrated slight interval increase of right upper lobe nodule.  Will need close outpatient follow-up with pulmonology on discharge. ? ?Essential hypertension ?Continue telmisartan 75 mg p.o. daily, hydrochlorothiazide 25 mg p.o. daily. ? ?Tobacco use disorder ?Patient continues to smoke half a pack a day, counseled on need for complete cessation. ? ?COPD ?Continue Breztri inhaler 2 puffs twice daily.  Tobacco cessation.  Outpatient follow-up with pulmonology. ? ?Type 2 diabetes mellitus ?Continue metformin 500 mg p.o. daily. ? ? ? ?Discharge Diagnoses:  ?Principal Problem: ?  CAP (community acquired pneumonia) ?Active Problems: ?  COPD GOLD 3  ?  Cigarette smoker ?  Essential hypertension ?  Multiple pulmonary nodules determined by computed tomography of lung ?  Obstructive sleep apnea syndrome ? ? ? ?Discharge Instructions ? ?Discharge Instructions   ? ? Call MD for:  difficulty breathing, headache or visual disturbances   Complete by: As directed ?  ? Call MD for:  extreme fatigue   Complete by: As directed ?  ? Call MD for:  persistant dizziness or light-headedness   Complete by: As directed ?  ? Call MD for:  persistant nausea and vomiting   Complete  by: As directed ?  ? Call MD for:  severe uncontrolled pain   Complete by: As directed ?  ? Call MD for:  temperature  >100.4   Complete by: As directed ?  ? Diet - low sodium heart healthy   Complete by: As directed ?  ? Increase activity slowly   Complete by: As directed ?  ? ?  ? ?Allergies as of 02/14/2022   ? ?   Reactions  ? Amoxicillin   ? itching  ? Compazine [prochlorperazine]   ? Lose muscle control in face   ? Norvasc [amlodipine] Swelling  ? Lower extremity swelling   ? Trelegy Ellipta [fluticasone-umeclidin-vilant] Swelling  ? Swelling in legs and headache.  ? ?  ? ?  ?Medication List  ?  ? ?STOP taking these medications   ? ?ALPRAZolam 0.25 MG tablet ?Commonly known as: XANAX ?  ?nicotine 14 mg/24hr patch ?Commonly known as: Nicoderm CQ ?  ?predniSONE 10 MG tablet ?Commonly known as: DELTASONE ?  ? ?  ? ?TAKE these medications   ? ?acetaminophen 500 MG tablet ?Commonly known as: TYLENOL ?Take 500 mg by mouth every 6 (six) hours as needed for moderate pain. ?  ?albuterol (2.5 MG/3ML) 0.083% nebulizer solution ?Commonly known as: PROVENTIL ?Take 3 mLs (2.5 mg total) by nebulization every 4 (four) hours as needed for wheezing or shortness of breath. ?  ?albuterol 108 (90 Base) MCG/ACT inhaler ?Commonly known as: VENTOLIN HFA ?Inhale 2 puffs into the lungs every 6 (six) hours as needed for wheezing or shortness of breath. ?  ?azithromycin 500 MG tablet ?Commonly known as: Zithromax ?Take 1 tablet (500 mg total) by mouth daily for 4 days. Take 1 tablet daily for 3 days. ?Start taking on: Feb 15, 2022 ?  ?Breztri Aerosphere 160-9-4.8 MCG/ACT Aero ?Generic drug: Budeson-Glycopyrrol-Formoterol ?INHALE 2 PUFFS BY MOUTH EVERY MORNING AND EVERY NIGHT AT BEDTIME ?What changed:  ?how much to take ?how to take this ?when to take this ?additional instructions ?  ?cefdinir 300 MG capsule ?Commonly known as: OMNICEF ?Take 1 capsule (300 mg total) by mouth 2 (two) times daily for 6 days. ?Start taking on: Feb 15, 2022 ?  ?hydrochlorothiazide 25 MG tablet ?Commonly known as: HYDRODIURIL ?Take 25 mg by mouth daily as needed (For fluid per  patient). ?  ?Leg Cramps Tabs ?Take 2 tablets by mouth at bedtime as needed (leg cramps). ?  ?metFORMIN 500 MG 24 hr tablet ?Commonly known as: GLUCOPHAGE-XR ?Take 500 mg by mouth every morning. ?  ?telmisartan 20 MG tablet ?Commonly known as: MICARDIS ?Take 1 tablet (20 mg total) by mouth at bedtime. ?  ?traMADol 50 MG tablet ?Commonly known as: Ultram ?Take 1 tablet (50 mg total) by mouth every 6 (six) hours as needed for up to 5 days for moderate pain. ?  ? ?  ? ? Follow-up Information   ? ? [name], NP. Schedule an appointment as soon as possible for a visit in 1 week(s).   ?Specialty: Family Medicine ?Contact information: ?[address] ?[place] [id number] ?[phone] ? ? ?  ?  ? ?  ?  ? ?  ? ?Allergies  ?Allergen Reactions  ? Amoxicillin   ?  itching  ? Compazine [Prochlorperazine]   ?  Lose muscle control in face   ? Norvasc [Amlodipine] Swelling  ?  Lower extremity swelling  ?  ? Trelegy Ellipta [Fluticasone-Umeclidin-Vilant] Swelling  ?  Swelling in legs and headache.  ? ? ?  Consultations: ?None ? ? ?Procedures/Studies: ?CT Angio Chest PE W and/or Wo Contrast ? ?Result Date: 02/13/2022 ?CLINICAL DATA:  Pulmonary embolism (PE) suspected, high prob. EXAM: CT ANGIOGRAPHY CHEST WITH CONTRAST TECHNIQUE: Multidetector CT imaging of the chest was performed using the standard protocol during bolus administration of intravenous contrast. Multiplanar CT image reconstructions and MIPs were obtained to evaluate the vascular anatomy. RADIATION DOSE REDUCTION: This exam was performed according to the departmental dose-optimization program which includes automated exposure control, adjustment of the mA and/or kV according to patient size and/or use of iterative reconstruction technique. CONTRAST:  14mL OMNIPAQUE IOHEXOL 350 MG/ML SOLN COMPARISON:  04/03/2021 FINDINGS: Cardiovascular: Adequate opacification of the pulmonary arterial tree. No intraluminal filling defect identified to suggest acute  pulmonary embolism. Central pulmonary arteries are of normal caliber. Moderate multi-vessel coronary artery calcification. Mild global cardiomegaly. No pericardial effusion. Mild atherosclerotic calcification wi

## 2022-02-14 NOTE — ED Notes (Signed)
Patient ambulatory to restroom. Patient oxygen saturation ranging from 90%-95% on room air. Patient has labored breathing on exertion. MD notified of results  ?

## 2022-02-14 NOTE — ED Notes (Signed)
Room delay due to MD coming back to evaluate patient for possible discharge after her ambulatory trial  ?

## 2022-02-19 NOTE — Telephone Encounter (Signed)
Do you want an order placed for a Chest CT W/O Contrast in 6 weeks and then f/u appt with Wert or Maggie Schwalbe to follow up after CT.  ?Please advise.  ?

## 2022-02-21 NOTE — Telephone Encounter (Signed)
I have updated PCC's that current Super D Chest CT order will need to be scheduled in 6 weeks around 03/30/22 and that pt will need a f/u appt with Eric Form, NP or Dr Melvyn Novas at least 1 week after CT.

## 2022-03-29 ENCOUNTER — Ambulatory Visit (HOSPITAL_BASED_OUTPATIENT_CLINIC_OR_DEPARTMENT_OTHER)
Admission: RE | Admit: 2022-03-29 | Discharge: 2022-03-29 | Disposition: A | Discharge status: Home / Self Care | Payer: Medicare Other | Source: Ambulatory Visit | Attending: Family Medicine | Admitting: Family Medicine

## 2022-03-29 ENCOUNTER — Telehealth: Payer: Self-pay | Admitting: Pulmonary Disease

## 2022-03-29 DIAGNOSIS — I700 Atherosclerosis of aorta: Secondary | ICD-10-CM | POA: Diagnosis not present

## 2022-03-29 DIAGNOSIS — R918 Other nonspecific abnormal finding of lung field: Secondary | ICD-10-CM | POA: Diagnosis not present

## 2022-03-29 DIAGNOSIS — F17200 Nicotine dependence, unspecified, uncomplicated: Secondary | ICD-10-CM | POA: Diagnosis not present

## 2022-03-29 DIAGNOSIS — I2510 Atherosclerotic heart disease of native coronary artery without angina pectoris: Secondary | ICD-10-CM | POA: Diagnosis not present

## 2022-03-29 DIAGNOSIS — J439 Emphysema, unspecified: Secondary | ICD-10-CM | POA: Insufficient documentation

## 2022-03-29 DIAGNOSIS — R911 Solitary pulmonary nodule: Secondary | ICD-10-CM | POA: Insufficient documentation

## 2022-03-29 NOTE — Telephone Encounter (Signed)
IMPRESSION: 1. Spiculated, masslike nodular opacity of the posterior perihilar left upper lobe measuring 3.1 x 2.6 cm, slightly increased in size compared to prior examination dated 02/13/2022, and much more clearly enlarged in comparison to more remote prior examinations. This is highly concerning for an enlarging primary lung malignancy. 2. There has been significant interval resolution of adjacent heterogeneous and ground-glass airspace opacity with some bandlike atelectasis or consolidation remaining in this vicinity. Findings are consistent with resolution of incidentally superimposed infection or inflammation, possibly reflecting postobstructive airspace disease. 3. No significant change in a subsolid nodule of the peripheral right upper lobe containing a biopsy marking clip, measuring 1.4 x 1.1 cm. 4. New small pulmonary nodule of the right upper lobe measuring 0.4 cm. New subsolid pulmonary nodule of the superior segment right lower lobe measuring 0.7 x 0.5 cm. These are nonspecific although modestly suspicious for small metastases or metachronous lung cancers. Other small nodules unchanged. 5. Emphysema. 6. Coronary artery disease.   These results will be called to the ordering clinician or representative by the Radiologist Assistant, and communication documented in the PACS or [employer].   Aortic Atherosclerosis (ICD10-I70.0) and Emphysema (ICD10-J43.9).     Electronically Signed   By: Jearld Lesch M.D.   On: 03/29/2022 16:46

## 2022-04-03 ENCOUNTER — Telehealth: Payer: Self-pay | Admitting: Cardiovascular Disease

## 2022-04-03 ENCOUNTER — Telehealth: Payer: Self-pay | Admitting: Internal Medicine

## 2022-04-03 ENCOUNTER — Encounter: Payer: Self-pay | Admitting: Cardiovascular Disease

## 2022-04-03 MED ORDER — BREZTRI AEROSPHERE 160-9-4.8 MCG/ACT IN AERO: INHALATION_SPRAY | RESPIRATORY_TRACT | 3 refills | Status: DC

## 2022-04-03 MED ORDER — TELMISARTAN 20 MG PO TABS: 20.0000 mg | ORAL_TABLET | Freq: Every day | ORAL | 1 refills | Status: DC

## 2022-04-03 NOTE — Progress Notes (Unsigned)
Laurie Mccoy, female    DOB: Aug 13, 1941     MRN: [id number]   Brief patient profile:  53   yowf active smoker prev worked with GP's  from [place] moved to [place] to live near her sons/grandchildren with onset around 1990 doe worse in hot/humid weather and with uri's much worse in winter of 2020 while on ACEi but blood pressure got so low and better since but wants to be cleared for umbilical surgery  in [place] wanted her cleared for 3rd surgery (original was for diverticulitis surgery) so referred to pulmonary clinic 03/10/2019 by Dr  Alvino Chapel.     History of Present Illness  03/10/2019  Pulmonary/ 1st office eval/Aryn Safran  Off acei x early May 2020  Chief Complaint  Patient presents with   Pulmonary Consult    Referred by Dr. Charlane Ferretti for pulmonary clearance for hernia repair. Pt states she has had asthma "for a long time". She states having increased SOB since since she was sick in Feb 2020.   Dyspnea:  Slowed by feet > sob at present Cough: advair irritation mouth throat but really no cough since off acei  Sleep: on side electric bed just a little / cpap  SABA use: albuterol 4-5 mdi/ never neb anymore Singulair could not take bad dreams   rec The key is to stop smoking completely before smoking completely stops you! -For smoking cessation classes/info call [phone]    Plan A = Automatic = stop advair/ start Symbicort 160 Take 2 puffs first thing in am and then another 2 puffs about 12 hours later.  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 She is using her proair 5 x daily on average. She has decided that she wants to stop smoking and  plans to stop end of this wk.   Dyspnea:  No regular ex/ limited by feet hurting x 1.5 y Cough: none Sleeping: on cpap does fine, but when starts day is sob SABA use: never needs at rest  02: none  rec Plan A = Automatic = Trelegy one click each  - take smooth deep breaths x 2 to make sure you get it all  Rinse and gargle when done  The key is to stop smoking completely before smoking completely stops you! Plan B = Backup Only use your albuterol inhaler as a rescue medication Plan C = Crisis - only use your albuterol nebulizer if you first try Plan B  07/26/2019 eval at [facility] for atypical cp migratory / upper abd also  Listed as taking lisinopril but hasn't started it  Nl trop/ probnp / cxr  rec re start lisinopril See cards > scheduled for 07/30/19 and no cp since   07/28/2019  f/u ov/Kristyne Woodring re: GOLD III copd / can't tol trelegy or symbicort so just using saba hfa tid Chief Complaint  Patient presents with   Follow-up  Dyspnea:  Still limited by feet/ able to walk  slow pace ok flat surfaces  MMRC2 = can't walk a nl pace on a flat grade s sob but does fine slow and flat  Cough: none Sleeping: on cpap  SABA use: 3 x day hfa/ no neb needed  02: none  rec Don't take lisinopril  Micardis 40 mg one half daily up to 2 whole pills  daily goal is to keep bp under 135 and 90 on bottom       07/03/2020  f/u ov/Dejion Grillo re: GOLD III / still smoking, happy with  trelegy   until started with sevre cough x 7d > treleg seems to make it worse. Chief Complaint  Patient presents with   Follow-up   Dyspnea:  Feet slow her down  But aslong as walk slowly not limited by   sob and can even go up a flight of steps  Cough: none until one week prior with light brown related to bad air tree surgery/ UC rx shot  Sleeping: on cpap recalled > f/u Kelly planned SABA use: normally not using but now starts each am with saba to "open her up for the day" before can use her trelegy. 02: none   Rec Prednisone 10 mg take  4 each am x 2 days,   2 each am x 2 days,  1 each am x 2 days and stop  Zpak  Instead of Trelegy  try   Breztri Take 2 puffs first thing in am and then another 2 puffs about 12 hours later.  If you prefer it I would be happy to call it in for you Work on inhaler technique.   10/25/2021  f/u ov/Eliz Nigg re: GOLD 3 copd / no cigs x 3 days maint on breztri though hfa not ideal  Chief Complaint  Patient presents with   Follow-up    Increased sob with exertion x 4 mths.  Dyspnea:  does fine push anywhere [place]  Cough: none  Sleeping: cpap/ head raised on pillows/ bed flat/ feels fine in ams SABA use: 4 puffs a day usually p ex  02: none  Covid status:  vax x 3 plus omicron Jan 2022  Rec Plan A = Automatic = Always=    Breztri  Take 2 puffs first thing in am and then another 2 puffs about 12 hours later.  Plan B = Backup (to supplement plan A, not to replace it) Only use your albuterol inhaler as a rescue medication  Ok to try albuterol 15 min before an activity (on alternating days)  that you know would usually make you short of breath  Plan C =  Crisis   more albuterol than usual >>> Prednisone 10 mg take  4 each am x 2 days,   2 each am x 2 days,  1 each am x 2 days and stop   Please schedule a follow up visit in 6  months but call sooner if needed    04/04/2022  f/u ov/Antoine Vandermeulen re: ***   maint on ***  No chief complaint on file.   Dyspnea:  *** Cough: *** Sleeping: *** SABA use: *** 02: *** Covid status:   ***   No obvious day to day or daytime variability or assoc excess/ purulent sputum or mucus plugs or hemoptysis or cp or chest tightness, subjective wheeze or overt sinus or hb symptoms.   *** without nocturnal  or early am exacerbation  of respiratory  c/o's or need for noct saba. Also denies any obvious fluctuation of symptoms with weather or environmental changes or other aggravating or alleviating factors except as outlined above    No unusual exposure hx or  h/o childhood pna/ asthma or knowledge of premature birth.  Current Allergies, Complete Past Medical History, Past Surgical History, Family History, and Social History were reviewed in [employer] record.  ROS  The following are not active complaints unless bolded Hoarseness, sore throat, dysphagia, dental problems, itching, sneezing,  nasal congestion or discharge of excess mucus or purulent secretions, ear ache,   fever, chills, sweats, unintended wt loss or wt gain, classically pleuritic or exertional cp,  orthopnea pnd or arm/hand swelling  or leg swelling, presyncope, palpitations, abdominal pain, anorexia, nausea, vomiting, diarrhea  or change in bowel habits or change in bladder habits, change in stools or change in urine, dysuria, hematuria,  rash, arthralgias, visual complaints, headache, numbness, weakness or ataxia or problems with walking or coordination,  change in mood or  memory.        No outpatient medications have been marked as taking for the 04/04/22 encounter (Appointment) with Tanda Rockers, MD.                                    Objective:    Wts  04/04/2022       ***  10/25/2021       201   07/03/2020      194   07/28/19 198 lb (89.8 kg)  05/10/19 197 lb 6.4 oz (89.5 kg)  04/12/19 195 lb 12.8 oz (88.8 kg)    Vital signs reviewed  04/04/2022  - Note at rest 02 sats  ***% on ***   General appearance:    ***      Mild bar***           Assessment

## 2022-04-03 NOTE — Telephone Encounter (Signed)
[facility]

## 2022-04-03 NOTE — Telephone Encounter (Signed)
Pt c/o medication issue:  1. Name of Medication: telmisartan (MICARDIS) 20 MG tablet  2. How are you currently taking this medication (dosage and times per day)? As written   3. Are you having a reaction (difficulty breathing--STAT)? no  4. What is your medication issue? Pt called stating to give a heads up to Dr. Claiborne Billings that [facility] will be calling or requesting a prescription for medication above to sent to them.

## 2022-04-03 NOTE — Telephone Encounter (Signed)
Called and spoke with pt who stated [facility] should be sending a request for her to receive 90-day supply of Breztri from their pharmacy. Stated to pt that we would get this taken care of for her. Rx for Laurie Mccoy has been sent to [facility] for pt. Nothing further needed.

## 2022-04-03 NOTE — Telephone Encounter (Signed)
Error

## 2022-04-04 ENCOUNTER — Ambulatory Visit (INDEPENDENT_AMBULATORY_CARE_PROVIDER_SITE_OTHER): Payer: Medicare Other | Admitting: Internal Medicine

## 2022-04-04 ENCOUNTER — Encounter: Payer: Self-pay | Admitting: Internal Medicine

## 2022-04-04 ENCOUNTER — Other Ambulatory Visit: Payer: Self-pay

## 2022-04-04 DIAGNOSIS — R918 Other nonspecific abnormal finding of lung field: Secondary | ICD-10-CM

## 2022-04-04 DIAGNOSIS — J449 Chronic obstructive pulmonary disease, unspecified: Secondary | ICD-10-CM | POA: Diagnosis not present

## 2022-04-04 DIAGNOSIS — F17210 Nicotine dependence, cigarettes, uncomplicated: Secondary | ICD-10-CM | POA: Diagnosis not present

## 2022-04-04 MED ORDER — BREZTRI AEROSPHERE 160-9-4.8 MCG/ACT IN AERO: 2.0000 | INHALATION_SPRAY | Freq: Two times a day (BID) | RESPIRATORY_TRACT | 0 refills | Status: DC

## 2022-04-04 NOTE — Assessment & Plan Note (Signed)
Counseled re importance of smoking cessation but did not meet time criteria for separate billing   °

## 2022-04-04 NOTE — Assessment & Plan Note (Addendum)
See CT  10/06/2019   - CT chest 02/15/20 1. Numerous bilateral ground-glass pulmonary nodules  On new  7 x 5 x 8 RUL , all others s change  Annual CT is recommended until 5 years of stability has been established. If persistent these nodules should be considered highly suspicious if the solid component of the nodule is 6 mm or greater in size and enlarging. > annual x one year = 02/14/21 placed in reminder file   . 2.8 cm left lobe thyroid nodule.   >>> f/u Dr  Steffanie Dunn in [place]  CT 04/03/21 1. Slight interval increase in size of the irregular somewhat lobular posterior right upper lobe ground-glass pulmonary lesion. Imaging features remain concerning for neoplasm>>> referred to Dr Windell Norfolk 04/05/2021 > neg bx 2. Interval resolution of the 7 mm ground-glass nodule posterior left upper lobe and 7 mm ground-glass nodule seen previously in the posterior left lower lobe - CT super D chest 03/29/22 1. Spiculated, masslike nodular opacity of the posterior perihilar left upper lobe measuring 3.1 x 2.6 cm, slightly increased in size compared to prior examination dated 02/13/2022, and much more clearly enlarged in comparison to more remote prior examinations. This is highly concerning for an enlarging primary lung malignancy. 2. There has been significant interval resolution of adjacent heterogeneous and ground-glass airspace opacity with some bandlike atelectasis or consolidation remaining in this vicinity. Findings are consistent with resolution of incidentally superimposed infection or inflammation, possibly reflecting postobstructive airspace disease. 3. No significant change in a subsolid nodule of the peripheral right upper lobe containing a biopsy marking clip, measuring 1.4 x 1.1 cm. 4. New small pulmonary nodule of the right upper lobe measuring 0.4 cm. New subsolid pulmonary nodule of the superior segment right lower lobe measuring 0.7 x 0.5 cm. These are nonspecific although modestly  suspicious for small metastases or metachronous lung cancers. Other small nodules unchanged. 5. Emphysema. 6. Coronary artery disease.  - PET 04/04/2022 >>>    She flattly refuses to consider any form of bx at this time but was willing to proceed with PET and consider empirical RT to any strongly suspicious lesions noting that there are more than one areas of concern   Discussed in detail all the  indications, usual  risks and alternatives  relative to the benefits with patient who agrees to proceed with w/u as outlined.     .       Each maintenance medication was reviewed in detail including emphasizing most importantly the difference between maintenance and prns and under what circumstances the prns are to be triggered using an action plan format where appropriate.  Total time for H and P, chart review, counseling, reviewing hfa /neb device(s) and generating customized AVS unique to this office visit / same day charting = 37 min

## 2022-04-04 NOTE — Assessment & Plan Note (Signed)
Active smoker Spirometry 12/28/2018  FEV1 0.82 (37%)  Ratio 0.56 with classic curvature - d/c acei around 1st of May 2020 - d/c advair 500 03/10/2019   - 03/10/2019    try symbicort 160 2bid  But worse sob, more saba by 04/12/2019  - alpha one AT  03/10/19   MM   Level 129  - 04/12/2019  After extensive coaching inhaler device,  effectiveness =    90% with elipta, try trelegy  05/10/2019 >>> patient reported reaction to Trelegy Ellipta = thush/ palpitations, transition back to Symbicort 05/10/2019 >>> pulmonary function testing ordered 08/12/2019 rec incruse one click each am> cough worse, did not do as well when d/c pred despite rx for GERD - 10/25/2021  After extensive coaching inhaler device,  effectiveness =    90%     Group D (now reclassified as E) in terms of symptom/risk and laba/lama/ICS  therefore appropriate rx at this point >>>  breztri and approp saba

## 2022-04-04 NOTE — Patient Instructions (Signed)
My office will be contacting you by phone for referral for PET and I will call you with results    Please schedule a follow up visit in 6 months but call sooner if needed

## 2022-04-19 ENCOUNTER — Ambulatory Visit (HOSPITAL_COMMUNITY): Payer: Medicare Other

## 2022-05-02 ENCOUNTER — Ambulatory Visit (HOSPITAL_COMMUNITY)
Admission: RE | Admit: 2022-05-02 | Discharge: 2022-05-02 | Disposition: A | Discharge status: Home / Self Care | Payer: Medicare Other | Source: Ambulatory Visit | Attending: Internal Medicine | Admitting: Internal Medicine

## 2022-05-02 DIAGNOSIS — R918 Other nonspecific abnormal finding of lung field: Secondary | ICD-10-CM | POA: Diagnosis present

## 2022-05-02 LAB — GLUCOSE, CAPILLARY: Glucose-Capillary: 116 mg/dL — ABNORMAL HIGH (ref 70–99)

## 2022-05-02 MED ORDER — FLUDEOXYGLUCOSE F - 18 (FDG) INJECTION
10.0000 | Freq: Once | INTRAVENOUS | Status: AC | PRN
  Administered 2022-05-02: 8.6 via INTRAVENOUS

## 2022-05-09 ENCOUNTER — Other Ambulatory Visit: Payer: Self-pay | Admitting: *Deleted

## 2022-05-09 MED ORDER — BREZTRI AEROSPHERE 160-9-4.8 MCG/ACT IN AERO: INHALATION_SPRAY | RESPIRATORY_TRACT | 5 refills | Status: AC

## 2022-05-22 ENCOUNTER — Telehealth: Payer: Self-pay | Admitting: Internal Medicine

## 2022-05-22 DIAGNOSIS — R918 Other nonspecific abnormal finding of lung field: Secondary | ICD-10-CM

## 2022-05-22 NOTE — Telephone Encounter (Signed)
Called patient and she states that Dr Melvyn Novas was supposed to order a biopsy of the place in her lungs. But I am not seeing anything ordered or anything noted for that nature. I am not sure what to do but I told the patient I would take care of this issue for this patient.   Please advise Dr Valeta Harms.

## 2022-05-23 NOTE — Telephone Encounter (Signed)
PCCM:  I am happy to get her scheduled for bronchoscopy and biopsy.  I know that Dr. Melvyn Novas was going to speak with the patient to make sure that she was still good with that plan.  It sounds like they may have spoke already.  Dr. Melvyn Novas, do you want me to work to get her scheduled for this?  Thanks  [place], DO [facility] 05/23/2022 11:58 AM

## 2022-05-23 NOTE — Telephone Encounter (Signed)
The patient has agreed to u/s directed bx of L supraclavcular lymph node by IR - order for [facility] next available

## 2022-05-24 ENCOUNTER — Encounter: Payer: Self-pay | Admitting: *Deleted

## 2022-05-24 NOTE — Progress Notes (Unsigned)
Arne Cleveland, MD  [place], [name]   [place] FNA or Core (if possible) L supraclav PET+ LAN  Lateral to LLP thyroid  See PET Im 52 [id number]   DDH        Previous Messages    ----- Message -----  From: Roosvelt Maser  Sent: 05/24/2022  11:00 AM EDT  To: Ir Procedure Requests  Subject: [place] Core Biopsy                                   Procedure:  [place] Core Biopsy   Reason:  Hypermetabolic left supraclavicular node   History:  Pet in chart   Provider:  Christinia Gully   Contact:  [phone]

## 2022-05-24 NOTE — Telephone Encounter (Signed)
Order was placed.

## 2022-06-12 ENCOUNTER — Other Ambulatory Visit: Payer: Self-pay | Admitting: Student

## 2022-06-12 DIAGNOSIS — Z419 Encounter for procedure for purposes other than remedying health state, unspecified: Secondary | ICD-10-CM

## 2022-06-13 ENCOUNTER — Other Ambulatory Visit: Payer: Self-pay

## 2022-06-13 ENCOUNTER — Encounter (HOSPITAL_COMMUNITY): Payer: Self-pay

## 2022-06-13 ENCOUNTER — Ambulatory Visit (HOSPITAL_COMMUNITY)
Admission: RE | Admit: 2022-06-13 | Discharge: 2022-06-13 | Disposition: A | Discharge status: Home / Self Care | Payer: Medicare Other | Source: Ambulatory Visit | Attending: Internal Medicine | Admitting: Internal Medicine

## 2022-06-13 DIAGNOSIS — R599 Enlarged lymph nodes, unspecified: Secondary | ICD-10-CM

## 2022-06-13 DIAGNOSIS — R7309 Other abnormal glucose: Secondary | ICD-10-CM | POA: Diagnosis not present

## 2022-06-13 DIAGNOSIS — R918 Other nonspecific abnormal finding of lung field: Secondary | ICD-10-CM

## 2022-06-13 DIAGNOSIS — R590 Localized enlarged lymph nodes: Secondary | ICD-10-CM | POA: Insufficient documentation

## 2022-06-13 DIAGNOSIS — C770 Secondary and unspecified malignant neoplasm of lymph nodes of head, face and neck: Secondary | ICD-10-CM | POA: Insufficient documentation

## 2022-06-13 DIAGNOSIS — Z419 Encounter for procedure for purposes other than remedying health state, unspecified: Secondary | ICD-10-CM

## 2022-06-13 LAB — GLUCOSE, CAPILLARY: Glucose-Capillary: 137 mg/dL — ABNORMAL HIGH (ref 70–99)

## 2022-06-13 MED ORDER — MIDAZOLAM HCL 2 MG/2ML IJ SOLN
INTRAMUSCULAR | Status: AC | PRN
  Administered 2022-06-13: 1 mg via INTRAVENOUS

## 2022-06-13 MED ORDER — FENTANYL CITRATE (PF) 100 MCG/2ML IJ SOLN
INTRAMUSCULAR | Status: AC | PRN
  Administered 2022-06-13: 25 ug via INTRAVENOUS
  Administered 2022-06-13: 50 ug via INTRAVENOUS

## 2022-06-13 MED ORDER — SODIUM CHLORIDE 0.9 % IV SOLN: INTRAVENOUS | Status: DC

## 2022-06-13 MED ORDER — FENTANYL CITRATE (PF) 100 MCG/2ML IJ SOLN
INTRAMUSCULAR | Status: AC
  Filled 2022-06-13: qty 2

## 2022-06-13 MED ORDER — MIDAZOLAM HCL 2 MG/2ML IJ SOLN
INTRAMUSCULAR | Status: AC
  Filled 2022-06-13: qty 2

## 2022-06-13 MED ORDER — LIDOCAINE HCL 1 % IJ SOLN
INTRAMUSCULAR | Status: AC
  Administered 2022-06-13: 10 mL
  Filled 2022-06-13: qty 20

## 2022-06-13 NOTE — Discharge Instructions (Addendum)
Needle Biopsy, Care After This sheet gives you information about how to care for yourself after your procedure. Your health care provider may also give you more specific instructions. If you have problems or questions, contact your health careprovider. What can I expect after the procedure? After the procedure, it is common to have soreness, bruising, or mild pain atthe puncture site. This should go away in a few days. Follow these instructions at home: Needle insertion site care Wash your hands with soap and water before you change your bandage (dressing). If you cannot use soap and water, use hand sanitizer. Follow instructions from your health care provider about how to take care of your puncture site. This includes: When and how to change your dressing. When to remove your dressing. Check your puncture site every day for signs of infection. Check for: Redness, swelling, or pain. Fluid or blood. Pus or a bad smell. Warmth.   General instructions Return to your normal activities as told by your health care provider. Ask your health care provider what activities are safe for you. Do not take baths, swim, or use a hot tub until your health care provider approves. Ask your health care provider if you may take showers. You may only be allowed to take sponge baths. Take over-the-counter and prescription medicines only as told by your health care provider. Keep all follow-up visits as told by your health care provider. This is important. Contact a health care provider if: You have a fever. You have redness, swelling, or pain at the puncture site that lasts longer than a few days. You have fluid, blood, or pus coming from your puncture site. Your puncture site feels warm to the touch. Get help right away if: You have severe bleeding from the puncture site. Summary After the procedure, it is common to have soreness, bruising, or mild pain at the puncture site. This should go away in a few  days. Check your puncture site every day for signs of infection, such as redness, swelling, or pain. Get help right away if you have severe bleeding from your puncture site. This information is not intended to replace advice given to you by your health care provider. Make sure you discuss any questions you have with your healthcare provider. Document Revised: 03/23/2020 Document Reviewed: 03/23/2020 Elsevier Patient Education  2022 [employer].      For questions /concerns may call  Interventional Radiology clinic [phone]   You may remove your dressing and shower tomorrow afternoon  DO NOT use EMLA cream for 2 weeks after port placement as the cream will remove surgical glue on your incision.     Moderate Conscious Sedation, Adult, Care After This sheet gives you information about how to care for yourself after your procedure. Your health care provider may also give you more specific instructions. If you have problems or questions, contact your health care provider. What can I expect after the procedure? After the procedure, it is common to have: Sleepiness for several hours. Impaired judgment for several hours. Difficulty with balance. Vomiting if you eat too soon. Follow these instructions at home: For the time period you were told by your health care provider:     Rest. Do not participate in activities where you could fall or become injured. Do not drive or use machinery. Do not drink alcohol. Do not take sleeping pills or medicines that cause drowsiness. Do not make important decisions or sign legal documents. Do not take care of children on your  own. Eating and drinking  Follow the diet recommended by your health care provider. Drink enough fluid to keep your urine pale yellow. If you vomit: Drink water, juice, or soup when you can drink without vomiting. Make sure you have little or no nausea before eating solid foods. General instructions Take over-the-counter  and prescription medicines only as told by your health care provider. Have a responsible adult stay with you for the time you are told. It is important to have someone help care for you until you are awake and alert. Do not smoke. Keep all follow-up visits as told by your health care provider. This is important. Contact a health care provider if: You are still sleepy or having trouble with balance after 24 hours. You feel light-headed. You keep feeling nauseous or you keep vomiting. You develop a rash. You have a fever. You have redness or swelling around the IV site. Get help right away if: You have trouble breathing. You have new-onset confusion at home. Summary After the procedure, it is common to feel sleepy, have impaired judgment, or feel nauseous if you eat too soon. Rest after you get home. Know the things you should not do after the procedure. Follow the diet recommended by your health care provider and drink enough fluid to keep your urine pale yellow. Get help right away if you have trouble breathing or new-onset confusion at home. This information is not intended to replace advice given to you by your health care provider. Make sure you discuss any questions you have with your health care provider. Document Revised: 01/21/2020 Document Reviewed: 08/19/2019 Elsevier Patient Education  [place].

## 2022-06-13 NOTE — Consult Note (Signed)
Chief Complaint: Patient was seen in consultation today for image guided left supraclavicular lymph node biopsy  Referring Physician(s): Wert,Michael B  Supervising Physician: Markus Daft  Patient Status: [place]  History of Present Illness: Laurie Mccoy is a 81 y.o. female smoker with past medical history COPD, sleep apnea, hypertension, pulmonary nodules with prior negative bronchoscopy 2022 who presents now with PET scan on 05/02/2022 revealing:   1. Hypermetabolic spiculated left upper lobe perihilar pulmonary mass is most consistent with primary bronchogenic neoplasm. 2. Hypermetabolic left supraclavicular, left hilar and mediastinal lymph nodes are consistent with disease involvement. 3. Small bilateral pulmonary nodules are unchanged from recent prior examination and do not demonstrate abnormal FDG avidity but are below the resolution of PET-CT, attention on follow-up imaging suggested. 4. No convincing scintigraphic evidence of hypermetabolic distant metastatic disease. 5. No significant abnormal FDG avidity in the previously biopsied subsolid 12 mm right upper lobe pulmonary nodule. 6. Non hypermetabolic incidental 2.7 cm left thyroid nodule. Recommend thyroid US. Reference: J Am Coll Radiol. 2015 Feb;12(2): 143-50 7. Low-level FDG avidity associated with linear band of soft tissue and nodularity in the anterior abdominal wall is favored inflammatory/postsurgical. 8. Aortic Atherosclerosis    She is scheduled today for image guided left supraclavicular lymph node biopsy for further evaluation.    Past Medical History:  Diagnosis Date   Asthma    Hypertension    Sleep apnea     Past Surgical History:  Procedure Laterality Date   APPENDECTOMY     BRONCHIAL BIOPSY  06/12/2021   Procedure: BRONCHIAL BIOPSIES;  Surgeon: Garner Nash, DO;  Location: [facility];  Service: Pulmonary;;   BRONCHIAL BRUSHINGS  06/12/2021   Procedure: BRONCHIAL  BRUSHINGS;  Surgeon: Garner Nash, DO;  Location: [facility];  Service: Pulmonary;;   BRONCHIAL WASHINGS  06/12/2021   Procedure: BRONCHIAL WASHINGS;  Surgeon: Garner Nash, DO;  Location: [facility];  Service: Pulmonary;;   CHOLECYSTECTOMY     COLON RESECTION     FIDUCIAL MARKER PLACEMENT  06/12/2021   Procedure: FIDUCIAL MARKER PLACEMENT;  Surgeon: Garner Nash, DO;  Location: [facility];  Service: Pulmonary;;   hernia     TONSILLECTOMY     VIDEO BRONCHOSCOPY WITH ENDOBRONCHIAL NAVIGATION Bilateral 06/12/2021   Procedure: VIDEO BRONCHOSCOPY WITH ENDOBRONCHIAL NAVIGATION;  Surgeon: Garner Nash, DO;  Location: [facility];  Service: Pulmonary;  Laterality: Bilateral;  ION   VIDEO BRONCHOSCOPY WITH RADIAL ENDOBRONCHIAL ULTRASOUND  06/12/2021   Procedure: RADIAL ENDOBRONCHIAL ULTRASOUND;  Surgeon: Garner Nash, DO;  Location: [facility];  Service: Pulmonary;;    Allergies: Amoxicillin, Compazine [prochlorperazine], Norvasc [amlodipine], and Trelegy ellipta [fluticasone-umeclidin-vilant]  Medications: Prior to Admission medications   Medication Sig Start Date End Date Taking? Authorizing Provider  acetaminophen (TYLENOL) 500 MG tablet Take 500 mg by mouth every 6 (six) hours as needed for moderate pain.   Yes [provider]  albuterol (PROVENTIL) (2.5 MG/3ML) 0.083% nebulizer solution Take 3 mLs (2.5 mg total) by nebulization every 4 (four) hours as needed for wheezing or shortness of breath. 04/13/19  Yes Tanda Rockers, MD  albuterol (VENTOLIN HFA) 108 (90 Base) MCG/ACT inhaler Inhale 2 puffs into the lungs every 6 (six) hours as needed for wheezing or shortness of breath. 10/18/19  Yes Tanda Rockers, MD  Budeson-Glycopyrrol-Formoterol (BREZTRI AEROSPHERE) 160-9-4.8 MCG/ACT AERO INHALE 2 PUFFS BY MOUTH EVERY MORNING AND EVERY NIGHT AT BEDTIME 05/09/22  Yes Tanda Rockers, MD  metFORMIN (GLUCOPHAGE-XR) 500 MG  24 hr tablet Take 500 mg by mouth every morning.  01/07/22  Yes [provider]  telmisartan (MICARDIS) 20 MG tablet Take 1 tablet (20 mg total) by mouth at bedtime. 04/03/22  Yes O'Neal, Cassie Freer, MD  Homeopathic Products (LEG CRAMPS) TABS Take 2 tablets by mouth at bedtime as needed (leg cramps).    [provider]  hydrochlorothiazide (HYDRODIURIL) 25 MG tablet Take 25 mg by mouth daily as needed (For fluid per patient). 04/25/20   [provider]     Family History  Problem Relation Age of Onset   Lung cancer Mother        never smoker   Brain cancer Mother    Lung cancer Father        smoked   Stroke Brother     Social History   Socioeconomic History   Marital status: Married    Spouse name: Not on file   Number of children: Not on file   Years of education: Not on file   Highest education level: Not on file  Occupational History   Not on file  Tobacco Use   Smoking status: Some Days    Packs/day: 0.50    Years: 46.00    Total pack years: 23.00    Types: Cigarettes    Passive exposure: Past   Smokeless tobacco: Never  Vaping Use   Vaping Use: Never used  Substance and Sexual Activity   Alcohol use: Not Currently   Drug use: Never   Sexual activity: Not on file  Other Topics Concern   Not on file  Social History Narrative   Not on file   Social Determinants of Health   Financial Resource Strain: Not on file  Food Insecurity: Not on file  Transportation Needs: Not on file  Physical Activity: Not on file  Stress: Not on file  Social Connections: Not on file      Review of Systems currently denies fever, chest pain, back pain, nausea, vomiting or bleeding.  Does have occasional headaches, occasional cough, some chronic dyspnea with exertion, mild tenderness of ventral abdominal hernia site  Vital Signs: BP (!) 164/73 (BP Location: Right Arm)   Pulse 91   Temp 98.3 F (36.8 C) (Oral)   Resp 15   Ht 5\' 6"  (1.676 m)   Wt 191 lb (86.6 kg)   SpO2 93%   BMI 30.83 kg/m       Physical Exam awake, alert.  Chest with distant breath sounds bilaterally.  Heart with regular rate and rhythm.  Abdomen soft, positive bowel sounds, mildly tender mid abdominal region near ventral hernia; trace pretibial edema bilaterally.  Imaging: No results found.  Labs:  CBC: Recent Labs    02/13/22 1720 02/13/22 1728 02/14/22 0242  WBC 9.8  --  9.4  HGB 14.5 15.0 13.6  HCT 43.3 44.0 42.2  PLT 297  --  281    COAGS: No results for input(s): "INR", "APTT" in the last 8760 hours.  BMP: Recent Labs    02/13/22 1720 02/13/22 1728 02/14/22 0242  NA 137 136 136  K 5.0 4.7 4.0  CL 104 101 106  CO2 25  --  24  GLUCOSE 111* 106* 102*  BUN 18 23 12   CALCIUM 8.7*  --  8.6*  CREATININE 0.75 0.70 0.62  GFRNONAA >60  --  >60    LIVER FUNCTION TESTS: Recent Labs    02/13/22 1720 02/14/22 0242  BILITOT 1.1 0.6  AST 23  12*  ALT 23 14  ALKPHOS 62 63  PROT 6.3* 5.8*  ALBUMIN 3.6 3.2*    TUMOR MARKERS: No results for input(s): "AFPTM", "CEA", "CA199", "CHROMGRNA" in the last 8760 hours.  Assessment and Plan: 81 y.o. female smoker with past medical history COPD, sleep apnea, hypertension, pulmonary nodules with prior negative bronchoscopy 2022 who presents now with PET scan on 05/02/2022 revealing:   1. Hypermetabolic spiculated left upper lobe perihilar pulmonary mass is most consistent with primary bronchogenic neoplasm. 2. Hypermetabolic left supraclavicular, left hilar and mediastinal lymph nodes are consistent with disease involvement. 3. Small bilateral pulmonary nodules are unchanged from recent prior examination and do not demonstrate abnormal FDG avidity but are below the resolution of PET-CT, attention on follow-up imaging suggested. 4. No convincing scintigraphic evidence of hypermetabolic distant metastatic disease. 5. No significant abnormal FDG avidity in the previously biopsied subsolid 12 mm right upper lobe pulmonary nodule. 6. Non  hypermetabolic incidental 2.7 cm left thyroid nodule. Recommend thyroid US. Reference: J Am Coll Radiol. 2015 Feb;12(2): 143-50 7. Low-level FDG avidity associated with linear band of soft tissue and nodularity in the anterior abdominal wall is favored inflammatory/postsurgical. 8. Aortic Atherosclerosis    She is scheduled today for image guided left supraclavicular lymph node biopsy for further evaluation.Risks and benefits of procedure was discussed with the patient  including, but not limited to bleeding, infection, damage to adjacent structures or low yield requiring additional tests.  All of the questions were answered and there is agreement to proceed.  Consent signed and in chart.    Thank you for this interesting consult.  I greatly enjoyed meeting Laurie Mccoy and look forward to participating in their care.  A copy of this report was sent to the requesting provider on this date.  Electronically Signed: D. Rowe Robert, PA-C 06/13/2022, 12:17 PM   I spent a total of  20 minutes   in face to face in clinical consultation, greater than 50% of which was counseling/coordinating care for image guided left supraclavicular lymph node biopsy

## 2022-06-13 NOTE — Procedures (Signed)
Interventional Radiology Procedure:   Indications: Suspicious left lung lesion with hypermetabolic lymph nodes on PET   Procedure: US guided FNA and core biopsy of left supraclavicular lymph node     Findings: Multiple small suspicious lymph nodes in left supraclavicular region.  7 FNAs and 3 cores from a lymph node.   Complications: No immediate complications noted.     EBL: Minimal  Plan: Discharge to home in 1 hour   Kinslee Dalpe R. Anselm Pancoast, MD  Pager: [phone]

## 2022-06-14 ENCOUNTER — Other Ambulatory Visit (HOSPITAL_COMMUNITY): Payer: Self-pay | Admitting: Internal Medicine

## 2022-06-14 DIAGNOSIS — R599 Enlarged lymph nodes, unspecified: Secondary | ICD-10-CM

## 2022-06-17 ENCOUNTER — Encounter: Payer: Self-pay | Admitting: Internal Medicine

## 2022-06-17 LAB — SURGICAL PATHOLOGY

## 2022-06-17 LAB — CYTOLOGY - NON PAP

## 2022-06-19 ENCOUNTER — Telehealth: Payer: Self-pay | Admitting: Internal Medicine

## 2022-06-19 ENCOUNTER — Encounter: Payer: Self-pay | Admitting: Internal Medicine

## 2022-06-19 NOTE — Telephone Encounter (Signed)
This patient wants to talk with you about a complaint.  She would not specify what she wanted to address with you. I told her I would send you a message and have you follow up with her. Thanks

## 2022-06-19 NOTE — Telephone Encounter (Signed)
I have called the patient and she is very upset that she has waited a few days to get her results  and she wants to talk to you ASAP. Please advise on the results?

## 2022-06-19 NOTE — Telephone Encounter (Signed)
See other mychart. Closing encounter.

## 2022-06-20 ENCOUNTER — Telehealth: Payer: Self-pay | Admitting: Internal Medicine

## 2022-06-20 ENCOUNTER — Other Ambulatory Visit: Payer: Self-pay | Admitting: Medical Oncology

## 2022-06-20 NOTE — Telephone Encounter (Signed)
Called pt to sch appt per 9/14 staff msg. No answer. Left msg for pt to call back to sch appt.

## 2022-06-20 NOTE — Telephone Encounter (Signed)
Scheduled appt per 9/14 staff msg. Pt is aware of appt date and time. Pt is aware to arrive 15 mins prior to appt time and to bring and updated insurance card. Pt is aware of appt location.

## 2022-06-21 ENCOUNTER — Inpatient Hospital Stay: Payer: Medicare Other | Attending: Internal Medicine | Admitting: Internal Medicine

## 2022-06-21 ENCOUNTER — Other Ambulatory Visit: Payer: Self-pay

## 2022-06-21 ENCOUNTER — Other Ambulatory Visit: Payer: Self-pay | Admitting: Internal Medicine

## 2022-06-21 ENCOUNTER — Other Ambulatory Visit: Payer: Self-pay | Admitting: *Deleted

## 2022-06-21 ENCOUNTER — Inpatient Hospital Stay: Payer: Medicare Other

## 2022-06-21 ENCOUNTER — Encounter: Payer: Self-pay | Admitting: *Deleted

## 2022-06-21 ENCOUNTER — Encounter: Payer: Self-pay | Admitting: Internal Medicine

## 2022-06-21 VITALS — BP 148/58 | HR 78 | Temp 97.7°F | Resp 18 | Ht 66.0 in | Wt 195.6 lb

## 2022-06-21 DIAGNOSIS — Z9049 Acquired absence of other specified parts of digestive tract: Secondary | ICD-10-CM | POA: Diagnosis not present

## 2022-06-21 DIAGNOSIS — C3492 Malignant neoplasm of unspecified part of left bronchus or lung: Secondary | ICD-10-CM

## 2022-06-21 DIAGNOSIS — Z808 Family history of malignant neoplasm of other organs or systems: Secondary | ICD-10-CM | POA: Insufficient documentation

## 2022-06-21 DIAGNOSIS — Z823 Family history of stroke: Secondary | ICD-10-CM | POA: Insufficient documentation

## 2022-06-21 DIAGNOSIS — Z801 Family history of malignant neoplasm of trachea, bronchus and lung: Secondary | ICD-10-CM | POA: Diagnosis not present

## 2022-06-21 DIAGNOSIS — Z8041 Family history of malignant neoplasm of ovary: Secondary | ICD-10-CM | POA: Insufficient documentation

## 2022-06-21 DIAGNOSIS — Z888 Allergy status to other drugs, medicaments and biological substances status: Secondary | ICD-10-CM | POA: Insufficient documentation

## 2022-06-21 DIAGNOSIS — J449 Chronic obstructive pulmonary disease, unspecified: Secondary | ICD-10-CM | POA: Insufficient documentation

## 2022-06-21 DIAGNOSIS — Z79899 Other long term (current) drug therapy: Secondary | ICD-10-CM | POA: Insufficient documentation

## 2022-06-21 DIAGNOSIS — C3412 Malignant neoplasm of upper lobe, left bronchus or lung: Secondary | ICD-10-CM | POA: Diagnosis present

## 2022-06-21 DIAGNOSIS — R110 Nausea: Secondary | ICD-10-CM | POA: Insufficient documentation

## 2022-06-21 DIAGNOSIS — Z880 Allergy status to penicillin: Secondary | ICD-10-CM | POA: Diagnosis not present

## 2022-06-21 DIAGNOSIS — Z8049 Family history of malignant neoplasm of other genital organs: Secondary | ICD-10-CM | POA: Insufficient documentation

## 2022-06-21 DIAGNOSIS — C770 Secondary and unspecified malignant neoplasm of lymph nodes of head, face and neck: Secondary | ICD-10-CM | POA: Diagnosis not present

## 2022-06-21 DIAGNOSIS — R531 Weakness: Secondary | ICD-10-CM | POA: Diagnosis not present

## 2022-06-21 DIAGNOSIS — R059 Cough, unspecified: Secondary | ICD-10-CM

## 2022-06-21 DIAGNOSIS — R058 Other specified cough: Secondary | ICD-10-CM | POA: Diagnosis not present

## 2022-06-21 DIAGNOSIS — R109 Unspecified abdominal pain: Secondary | ICD-10-CM | POA: Diagnosis not present

## 2022-06-21 DIAGNOSIS — F17210 Nicotine dependence, cigarettes, uncomplicated: Secondary | ICD-10-CM | POA: Diagnosis not present

## 2022-06-21 DIAGNOSIS — Z5111 Encounter for antineoplastic chemotherapy: Secondary | ICD-10-CM | POA: Insufficient documentation

## 2022-06-21 DIAGNOSIS — C3490 Malignant neoplasm of unspecified part of unspecified bronchus or lung: Secondary | ICD-10-CM

## 2022-06-21 DIAGNOSIS — Z7189 Other specified counseling: Secondary | ICD-10-CM | POA: Insufficient documentation

## 2022-06-21 LAB — CBC WITH DIFFERENTIAL (CANCER CENTER ONLY)
Abs Immature Granulocytes: 0.02 10*3/uL (ref 0.00–0.07)
Basophils Absolute: 0.1 10*3/uL (ref 0.0–0.1)
Basophils Relative: 1 %
Eosinophils Absolute: 0.1 10*3/uL (ref 0.0–0.5)
Eosinophils Relative: 2 %
HCT: 42.2 % (ref 36.0–46.0)
Hemoglobin: 14.1 g/dL (ref 12.0–15.0)
Immature Granulocytes: 0 %
Lymphocytes Relative: 23 %
Lymphs Abs: 1.5 10*3/uL (ref 0.7–4.0)
MCH: 31.9 pg (ref 26.0–34.0)
MCHC: 33.4 g/dL (ref 30.0–36.0)
MCV: 95.5 fL (ref 80.0–100.0)
Monocytes Absolute: 0.6 10*3/uL (ref 0.1–1.0)
Monocytes Relative: 9 %
Neutro Abs: 4.3 10*3/uL (ref 1.7–7.7)
Neutrophils Relative %: 65 %
Platelet Count: 229 10*3/uL (ref 150–400)
RBC: 4.42 MIL/uL (ref 3.87–5.11)
RDW: 12.5 % (ref 11.5–15.5)
WBC Count: 6.7 10*3/uL (ref 4.0–10.5)
nRBC: 0 % (ref 0.0–0.2)

## 2022-06-21 LAB — CMP (CANCER CENTER ONLY)
ALT: 16 U/L (ref 0–44)
AST: 17 U/L (ref 15–41)
Albumin: 4 g/dL (ref 3.5–5.0)
Alkaline Phosphatase: 61 U/L (ref 38–126)
Anion gap: 7 (ref 5–15)
BUN: 20 mg/dL (ref 8–23)
CO2: 24 mmol/L (ref 22–32)
Calcium: 9 mg/dL (ref 8.9–10.3)
Chloride: 107 mmol/L (ref 98–111)
Creatinine: 0.59 mg/dL (ref 0.44–1.00)
GFR, Estimated: >60 mL/min (ref >60)
Glucose, Bld: 128 mg/dL — ABNORMAL HIGH (ref 70–99)
Potassium: 3.9 mmol/L (ref 3.5–5.1)
Sodium: 138 mmol/L (ref 135–145)
Total Bilirubin: 0.6 mg/dL (ref 0.3–1.2)
Total Protein: 6.5 g/dL (ref 6.5–8.1)

## 2022-06-21 NOTE — Progress Notes (Signed)
START ON PATHWAY REGIMEN - Non-Small Cell Lung     A cycle is every 7 days, concurrent with RT:     Paclitaxel      Carboplatin   **Always confirm dose/schedule in your pharmacy ordering system**  Patient Characteristics: Preoperative or Nonsurgical Candidate (Clinical Staging), Stage III - Nonsurgical Candidate (Nonsquamous and Squamous), PS = 0, 1 Therapeutic Status: Preoperative or Nonsurgical Candidate (Clinical Staging) AJCC T Category: cT2a AJCC N Category: cN3 AJCC M Category: cM0 AJCC 8 Stage Grouping: IIIB ECOG Performance Status: 1 Intent of Therapy: Curative Intent, Discussed with Patient

## 2022-06-21 NOTE — Progress Notes (Signed)
[name]   [phone]  CONSULT NOTE  REFERRING PHYSICIAN: Dr. Christinia Gully  REASON FOR CONSULTATION:  81 years old white female recently diagnosed with lung cancer  HPI Laurie Mccoy is a 81 y.o. female with past medical history significant for hypertension,, COPD sleep apnea as well as asthma and long history of smoking.  The patient was admitted to the hospital on Feb 13, 2022 after complaining of shortness of breath as well as cough and weakness and left flank pain that was going on for 1 week.  She had CT angiogram of the chest on 02/13/2022 that showed no evidence of pulmonary embolism but there was focal consolidation within the lingula suspicious for an infection in the acute setting but follow-up chest radiograph was recommended in 3-4 weeks to document complete resolution.  Of note that the patient had previous CT scan of the chest on 02/15/2020 that showed numerous bilateral groundglass pulmonary nodules including right upper lobe 0.7 x 0.5 x 0.8 cm, right upper lobe 1.3 x 0.4 x 1.0 cm, left lower lobe 0.8 x 0.5 x 0.7 cm and left upper lobe 0.6 x 0.6 x 0.6 cm.  She had bronchoscopy on 06/12/2021 by Dr. Valeta Harms that showed no malignant cells.  Repeat CT super D of the chest on 03/29/2022 showed spiculated masslike nodular opacity of the posterior perihilar left upper lobe measuring 3.1 x 2.6 cm increased in size compared to the prior exam on 02/13/2022 and much more clearly enlarged in comparison to the remote prior examination.  This was highly concerning for an enlarging primary lung malignancy.  There was new small pulmonary nodule of the right upper lobe measuring 0.4 cm and the new subsolid pulmonary nodule of the superior segment right lower lobe measuring 0.7 x 0.5 cm.  A PET scan was performed on May 02, 2022 and it showed hypermetabolic spiculated left upper lobe perihilar pulmonary mass most consistent with primary bronchogenic  neoplasm.  There was also hypermetabolic left supraclavicular, left hilar and mediastinal lymph nodes consistent with disease involvement.  There was small bilateral pulmonary nodules that are unchanged and below the resolution of the PET CT.  There was no convincing evidence of hypermetabolic distant metastatic disease.  No significant abnormal FDG avidity in the previously biopsied subsolid 1.2 cm right upper lobe pulmonary nodule.  There was also non-hypermetabolic incidental 2.7 cm left thyroid nodule.  On 06/13/2022 the patient underwent ultrasound-guided core biopsy of the left supraclavicular lymph node by interventional radiology.  The final pathology ([id number]) showed metastatic poorly differentiated non-small cell carcinoma. Immunohistochemical stains support a diagnosis of a poorly differentiated adenocarcinoma of lung origin (CK7, Napsin and TTF-1 positive, negative for CK20, CK5/6 and p40). The patient was referred to me today for evaluation and recommendation regarding treatment of her condition. When seen today the patient is feeling fine with no concerning complaints except for cough productive of clear sputum.  She denied having any chest pain, shortness of breath or hemoptysis.  She has no nausea, vomiting, diarrhea or constipation.  She has no headache or visual changes.  She has no weight loss or night sweats. Family history significant for mother with lung cancer as well as brain cancer.  Father had lung cancer.  She also has a sister with ovarian and uterine cancer and brother with a stroke. The patient is married and has 3 sons.  She was accompanied today by her husband Laurie Mccoy.  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  She has a history of smoking 0.5 pack/day for around 63 years and quit a week ago.  She drinks alcohol occasionally and no history of drug abuse.  HPI  Past Medical History:  Diagnosis Date   Asthma    Hypertension    Sleep apnea      Past Surgical History:  Procedure Laterality Date   APPENDECTOMY     BRONCHIAL BIOPSY  06/12/2021   Procedure: BRONCHIAL BIOPSIES;  Surgeon: Garner Nash, DO;  Location: [facility];  Service: Pulmonary;;   BRONCHIAL BRUSHINGS  06/12/2021   Procedure: BRONCHIAL BRUSHINGS;  Surgeon: Garner Nash, DO;  Location: [facility];  Service: Pulmonary;;   BRONCHIAL WASHINGS  06/12/2021   Procedure: BRONCHIAL WASHINGS;  Surgeon: Garner Nash, DO;  Location: [facility];  Service: Pulmonary;;   CHOLECYSTECTOMY     COLON RESECTION     FIDUCIAL MARKER PLACEMENT  06/12/2021   Procedure: FIDUCIAL MARKER PLACEMENT;  Surgeon: Garner Nash, DO;  Location: [facility];  Service: Pulmonary;;   hernia     TONSILLECTOMY     VIDEO BRONCHOSCOPY WITH ENDOBRONCHIAL NAVIGATION Bilateral 06/12/2021   Procedure: VIDEO BRONCHOSCOPY WITH ENDOBRONCHIAL NAVIGATION;  Surgeon: Garner Nash, DO;  Location: [facility];  Service: Pulmonary;  Laterality: Bilateral;  ION   VIDEO BRONCHOSCOPY WITH RADIAL ENDOBRONCHIAL ULTRASOUND  06/12/2021   Procedure: RADIAL ENDOBRONCHIAL ULTRASOUND;  Surgeon: Garner Nash, DO;  Location: [facility];  Service: Pulmonary;;    Family History  Problem Relation Age of Onset   Lung cancer Mother        never smoker   Brain cancer Mother    Lung cancer Father        smoked   Stroke Brother     Social History Social History   Tobacco Use   Smoking status: Some Days    Packs/day: 0.50    Years: 46.00    Total pack years: 23.00    Types: Cigarettes    Passive exposure: Past   Smokeless tobacco: Never  Vaping Use   Vaping Use: Never used  Substance Use Topics   Alcohol use: Not Currently   Drug use: Never    Allergies  Allergen Reactions   Amoxicillin     itching   Compazine [Prochlorperazine]     Lose muscle control in face    Norvasc [Amlodipine] Swelling    Lower extremity swelling     Trelegy Ellipta [Fluticasone-Umeclidin-Vilant] Swelling     Swelling in legs and headache.    Current Outpatient Medications  Medication Sig Dispense Refill   acetaminophen (TYLENOL) 500 MG tablet Take 500 mg by mouth every 6 (six) hours as needed for moderate pain.     albuterol (PROVENTIL) (2.5 MG/3ML) 0.083% nebulizer solution Take 3 mLs (2.5 mg total) by nebulization every 4 (four) hours as needed for wheezing or shortness of breath.     albuterol (VENTOLIN HFA) 108 (90 Base) MCG/ACT inhaler Inhale 2 puffs into the lungs every 6 (six) hours as needed for wheezing or shortness of breath. 18 g 1   Budeson-Glycopyrrol-Formoterol (BREZTRI AEROSPHERE) 160-9-4.8 MCG/ACT AERO INHALE 2 PUFFS BY MOUTH EVERY MORNING AND EVERY NIGHT AT BEDTIME 10.7 g 5   Homeopathic Products (LEG CRAMPS) TABS Take 2 tablets by mouth at bedtime as needed (leg cramps).     hydrochlorothiazide (HYDRODIURIL) 25 MG tablet Take 25 mg by mouth daily as needed (For fluid per patient).     metFORMIN (GLUCOPHAGE-XR)  500 MG 24 hr tablet Take 500 mg by mouth every morning.     telmisartan (MICARDIS) 20 MG tablet Take 1 tablet (20 mg total) by mouth at bedtime. 90 tablet 1   No current facility-administered medications for this visit.    Review of Systems  Constitutional: negative Eyes: negative Ears, nose, mouth, throat, and face: negative Respiratory: positive for cough Cardiovascular: negative Gastrointestinal: negative Genitourinary:negative Integument/breast: negative Hematologic/lymphatic: negative Musculoskeletal:negative Neurological: negative Behavioral/Psych: negative Endocrine: negative Allergic/Immunologic: negative  Physical Exam  ZJQ:BHALP, healthy, no distress, well nourished, well developed, and anxious SKIN: skin color, texture, turgor are normal, no rashes or significant lesions HEAD: Normocephalic, No masses, lesions, tenderness or abnormalities EYES: normal, PERRLA, Conjunctiva are pink and non-injected EARS: External ears normal, Canals  clear OROPHARYNX:no exudate, no erythema, and lips, buccal mucosa, and tongue normal  NECK: supple, no adenopathy, no JVD LYMPH:  no palpable lymphadenopathy, no hepatosplenomegaly BREAST:not examined LUNGS: clear to auscultation , and palpation HEART: regular rate & rhythm, no murmurs, and no gallops ABDOMEN:abdomen soft, non-tender, normal bowel sounds, and no masses or organomegaly BACK: Back symmetric, no curvature., No CVA tenderness EXTREMITIES:no joint deformities, effusion, or inflammation, no edema  NEURO: alert & oriented x 3 with fluent speech, no focal motor/sensory deficits  PERFORMANCE STATUS: ECOG 1  LABORATORY DATA: Lab Results  Component Value Date   WBC 9.4 02/14/2022   HGB 13.6 02/14/2022   HCT 42.2 02/14/2022   MCV 97.0 02/14/2022   PLT 281 02/14/2022      Chemistry      Component Value Date/Time   NA 136 02/14/2022 0242   NA 139 09/17/2019 1550   K 4.0 02/14/2022 0242   CL 106 02/14/2022 0242   CO2 24 02/14/2022 0242   BUN 12 02/14/2022 0242   BUN 9 09/17/2019 1550   CREATININE 0.62 02/14/2022 0242      Component Value Date/Time   CALCIUM 8.6 (L) 02/14/2022 0242   ALKPHOS 63 02/14/2022 0242   AST 12 (L) 02/14/2022 0242   ALT 14 02/14/2022 0242   BILITOT 0.6 02/14/2022 0242       RADIOGRAPHIC STUDIES: [place] FINE NEEDLE ASP 1ST LESION  Result Date: 06/14/2022 INDICATION: ADD ON CODE, SEE PRIOR DICTATION EXAM: ADD ON CODE, SEE PRIOR DICTATION MEDICATIONS: None. ANESTHESIA/SEDATION: ADD ON CODE, SEE PRIOR DICTATION COMPLICATIONS: ADD ON CODE, SEE PRIOR DICTATION PROCEDURE: ADD ON CODE, SEE PRIOR DICTATION IMPRESSION: ADD ON CODE, SEE PRIOR DICTATION Electronically Signed   By: Jacqulynn Cadet M.D.   On: 06/14/2022 16:34   [place] CORE BIOPSY (LYMPH NODES)  Result Date: 06/13/2022 INDICATION: 81 year old with a suspicious left upper lobe lesion and hypermetabolic lymph nodes. Tissue diagnosis is needed. EXAM: ULTRASOUND-GUIDED FINE NEEDLE ASPIRATION AND  CORE BIOPSY OF LEFT SUPRACLAVICULAR LYMPH NODE MEDICATIONS: Moderate sedation ANESTHESIA/SEDATION: Moderate (conscious) sedation was employed during this procedure. A total of Versed 1 mg and Fentanyl 75 mcg was administered intravenously by the radiology nurse. Total intra-service moderate Sedation Time: 40 minutes. The patient's level of consciousness and vital signs were monitored continuously by radiology nursing throughout the procedure under my direct supervision. FLUOROSCOPY TIME:  None COMPLICATIONS: None immediate. PROCEDURE: Informed written consent was obtained from the patient after a thorough discussion of the procedural risks, benefits and alternatives. All questions were addressed. A timeout was performed prior to the initiation of the procedure. The left supraclavicular area was thoroughly evaluated with ultrasound. A left supraclavicular lymph node was identified and targeted. The skin was prepped with chlorhexidine and  sterile field was created. Skin was anesthetized using 1% lidocaine. Using ultrasound guidance, a total of 7 fine-needle aspirations were obtained with 25 gauge needles. Subsequently, 3 core biopsies were obtained using ultrasound guidance with 18 gauge core device. Core specimens placed in saline. Bandage placed over the puncture site. FINDINGS: There are several small hypoechoic lymph nodes in left supraclavicular region. Lymph nodes lateral to the left thyroid lobe are not amendable to biopsy due to patient's respirations and the location posterior to the left internal jugular vein. However, a lymph node just anterior to the left subclavian artery was safe for biopsy. Fine-needle aspirations and core biopsies were obtained from this lymph node. No immediate bleeding or hematoma formation. IMPRESSION: Ultrasound-guided fine needle aspirations and core biopsies of a left supraclavicular lymph node. Electronically Signed   By: Markus Daft M.D.   On: 06/13/2022 17:52    ASSESSMENT:  This is a very pleasant 81 years old white female recently diagnosed with a stage IIIb (T2 a, N3, M0) non-small cell lung cancer, adenocarcinoma presented with left upper lobe perihilar mass in addition to left hilar and mediastinal lymphadenopathy as well as left supraclavicular lymphadenopathy diagnosed in September 2023. Molecular studies are still pending.  PLAN: I had a lengthy discussion with the patient and her husband today about her current disease stage, prognosis and treatment options. I personally and independently reviewed the scan images and discussed results with the patient and her husband. I recommended for her to complete the staging work-up by ordering MRI of the brain to rule out brain metastasis. I explained to the patient that if she has no evidence of metastatic disease outside the currently known areas, she would probably benefit from a course of concurrent chemoradiation with weekly carboplatin for AUC of 2 and paclitaxel 45 Mg/M2 followed by consolidation immunotherapy if she has no evidence for disease progression. I discussed with the patient the adverse effect of the chemotherapy including but not limited to alopecia, myelosuppression, nausea and vomiting, peripheral neuropathy, liver or renal dysfunction. I will refer the patient to radiation oncology for discussion of the radiotherapy option. I will arrange for the patient to have a chemotherapy education class before starting her systemic chemotherapy. For nausea, I will give the patient a prescription for Zofran.  She has intolerance to Compazine. I will see her back for follow-up visit with the start of the first cycle of her treatment on 07/01/2022 and then every 2 weeks during the treatment. I strongly encouraged the patient to continue with the smoking cessation. The patient was advised to call immediately if she has any other concerning symptoms in the interval.  The patient voices understanding of current disease  status and treatment options and is in agreement with the current care plan.  All questions were answered. The patient knows to call the clinic with any problems, questions or concerns. We can certainly see the patient much sooner if necessary.  Thank you so much for allowing me to participate in the care of Laurie Mccoy. I will continue to follow up the patient with you and assist in her care. The total time spent in the appointment was 90 minutes.  Disclaimer: This note was dictated with voice recognition software. Similar sounding words can inadvertently be transcribed and may not be corrected upon review.   Eilleen Kempf June 21, 2022, 8:35 AM

## 2022-06-21 NOTE — Progress Notes (Signed)
The proposed treatment discussed in conference is for discussion purpose only and is not a binding recommendation.  The patients have not been physically examined, or presented with their treatment options.  Therefore, final treatment plans cannot be decided.  

## 2022-06-21 NOTE — Progress Notes (Signed)
Received message from [place] in Pathology that Dr. Saralyn Pilar requested Foundation One and PDL-1 testing on patient as per discussion in Thoracic Tumor Board.  Needed information provided.

## 2022-06-24 ENCOUNTER — Telehealth: Payer: Self-pay | Admitting: Internal Medicine

## 2022-06-24 NOTE — Progress Notes (Addendum)
Pharmacist Chemotherapy Monitoring - Initial Assessment    Anticipated start date: 07/01/22   The following has been reviewed per standard work regarding the patient's treatment regimen: The patient's diagnosis, treatment plan and drug doses, and organ/hematologic function Lab orders and baseline tests specific to treatment regimen  The treatment plan start date, drug sequencing, and pre-medications Prior authorization status  Patient's documented medication list, including drug-drug interaction screen and prescriptions for anti-emetics and supportive care specific to the treatment regimen The drug concentrations, fluid compatibility, administration routes, and timing of the medications to be used The patient's access for treatment and lifetime cumulative dose history, if applicable  The patient's medication allergies and previous infusion related reactions, if applicable   Changes made to treatment plan:  N/A  Follow up needed:  1) Pending authorization for treatment  2) Change Aloxi to Zofran in premedications due to Compazine intolerance?   Raul Del [place], [place], BCPS, BCOP 06/24/2022  1:53 PM

## 2022-06-24 NOTE — Telephone Encounter (Signed)
Left message with follow-up appointments per 9/15 los.

## 2022-06-25 ENCOUNTER — Encounter: Payer: Self-pay | Admitting: Radiation Oncology

## 2022-06-25 ENCOUNTER — Other Ambulatory Visit: Payer: Self-pay

## 2022-06-25 NOTE — Progress Notes (Signed)
Radiation Oncology         [phone] ________________________________  Initial Outpatient Consultation  Name: Laurie Mccoy MRN: [id number]  Date: 06/26/2022  DOB: [birth year]  CC:Laurie Booze, NP  Curt Bears, MD   REFERRING PHYSICIAN: Curt Bears, MD  DIAGNOSIS: There were no encounter diagnoses.  Stage IIIb (T2 a, N3, M0) non-small cell lung cancer, adenocarcinoma presented with left upper lobe perihilar mass in addition to left hilar and mediastinal lymphadenopathy as well as left supraclavicular lymphadenopathy  HISTORY OF PRESENT ILLNESS::Laurie Mccoy is a 81 y.o. female who is accompanied by ***. she is seen as a courtesy of Dr. Julien Nordmann for an opinion concerning radiation therapy as part of management for her recently diagnosed left lung cancer. Prior to her recent diagnosis, the patient was followed by pulmonology for suspicious RUL nodules in 2022. These were biopsied and negative for malignancy. The patient's recent history pertaining to her left lung cancer diagnosis is detailed as follows.  The patient presented to the [facility] ED on 02/13/22 for evaluation of back pain.  Patient stated that she was sitting down when she had sudden onset pain between the scapula.  She detailed that the pain was worse with deep breaths and any movement of the back.  She also endorsed some mild associated chest pain but denied any diaphoresis, nausea, vomiting, numbness, tingling, weakness or other systemic symptoms. CXR performed in the ED showed a focal opacity within the left mid lung zone, appearing to be possibly infectious or inflammatory in etiology. CTA PE with and without contrast further revealed: a focal consolidation within the a lingula, in keeping with acute lobar pneumonia, and a slight interval increase in size but a decrease in density of a mixed solid and cystic nodule within the right upper lobe measuring 11 mm x 16 mm (decrease in density likely correlated with post  therapy changes).   Follow up super D chest CT w/o contrast on 03/29/22 showed: a slight interval increase in size of the masslike nodular opacity of the posterior perihilar left upper lobe measuring 3.1 x 2.6 cm, concerning for an enlarging primary lung malignancy; significant interval resolution of adjacent heterogeneous and ground-glass airspace opacity with some bandlike atelectasis or remaining consolidation (consistent with resolution of incidentally superimposed infection or inflammation); a nonspecific but new small pulmonary nodule of the right upper lobe measuring 0.4 cm modestly suspicious for small metastases or metachronous lung cancer; an additional nonspecific but new subsolid pulmonary nodule of the superior segment right lower lobe measuring 0.7 x 0.5 cm also modestly suspicious for small metastases or metachronous lung cancer; and no significant interval change of a subsolid nodule of the peripheral right upper lobe, measuring 1.4 x 1.1 cm. Other small nodules in the right lung were also without change in the interval.   The patient followed up with Dr. Melvyn Novas, [facility], on 04/04/22. During this visit, the patient reported "choppy" breathing and hoarse voice since having pneumonia this past May. In terms of CT findings, Dr. Melvyn Novas recommended proceeding with PET for further evaluation.   PET scan on 05/02/22 showed: the spiculated LUL perihilar mass as hypermetabolic and thus most in keeping with primary bronchogenic neoplasm. PET also showed: hypermetabolic left supraclavicular, left hilar and mediastinal lymph nodes consistent with disease involvement; interval stability of other small bilateral pulmonary nodules with normal FDG avidity; and no significant abnormal FDG avidity in the previously biopsied sub-solid 12 mm right upper lobe pulmonary nodule. PET otherwise showed no convincing scintigraphic evidence of  hypermetabolic distant metastatic disease. Other findings of  potential clinical significance appreciated included a non hypermetabolic 2.7 cm left thyroid nodule, and low-level FDG avidity associated with a linear band of soft tissue and nodularity in the anterior abdominal wall, favored to be inflammatory/postsurgical in etiology.  Subsequently, the patient underwent left supraclavicular lymph node biopsies on 06/13/22. Pathology revealed metastatic poorly differentiated non-small cell carcinoma (CK7, Napsin and TTF-1 positive, negative for CK20, CK5/6 and p40).   Accordingly, the patient was referred to Dr. Julien Nordmann on 06/21/22 to discuss treatment options. During this visit, the patient denied any symptoms other than cough productive of clear sputum and nausea. Pending MRI of the brain to exclude metastatic disease, Dr. Julien Nordmann recommends proceeding with a course of concurrent chemoradiation with weekly carboplatin and paclitaxel, followed by consolidation immunotherapy if she has no evidence for disease progression. The patient is agreeable with this plan.  (For her nausea, Dr. Julien Nordmann has prescribed her Zofran).   Patient's tentative start date for chemo is 07/01/22.  Of note: patient has a family history significant for lung and brain cancer in her mother, lung cancer in her father, and ovarian and uterine cancer in her sister.     PREVIOUS RADIATION THERAPY: No  PAST MEDICAL HISTORY:  Past Medical History:  Diagnosis Date   Asthma    Hypertension    Lung cancer ([place])    Sleep apnea     PAST SURGICAL HISTORY: Past Surgical History:  Procedure Laterality Date   APPENDECTOMY     BRONCHIAL BIOPSY  06/12/2021   Procedure: BRONCHIAL BIOPSIES;  Surgeon: Garner Nash, DO;  Location: [facility];  Service: Pulmonary;;   BRONCHIAL BRUSHINGS  06/12/2021   Procedure: BRONCHIAL BRUSHINGS;  Surgeon: Garner Nash, DO;  Location: [place] [facility];  Service: Pulmonary;;   BRONCHIAL WASHINGS  06/12/2021   Procedure: BRONCHIAL WASHINGS;  Surgeon: Garner Nash, DO;  Location: [facility];  Service: Pulmonary;;   CHOLECYSTECTOMY     COLON RESECTION     FIDUCIAL MARKER PLACEMENT  06/12/2021   Procedure: FIDUCIAL MARKER PLACEMENT;  Surgeon: Garner Nash, DO;  Location: [place] [facility];  Service: Pulmonary;;   hernia     TONSILLECTOMY     VIDEO BRONCHOSCOPY WITH ENDOBRONCHIAL NAVIGATION Bilateral 06/12/2021   Procedure: VIDEO BRONCHOSCOPY WITH ENDOBRONCHIAL NAVIGATION;  Surgeon: Garner Nash, DO;  Location: [place];  Service: Pulmonary;  Laterality: Bilateral;  ION   VIDEO BRONCHOSCOPY WITH RADIAL ENDOBRONCHIAL ULTRASOUND  06/12/2021   Procedure: RADIAL ENDOBRONCHIAL ULTRASOUND;  Surgeon: Garner Nash, DO;  Location: [facility];  Service: Pulmonary;;    FAMILY HISTORY:  Family History  Problem Relation Age of Onset   Lung cancer Mother        never smoker   Brain cancer Mother    Lung cancer Father        smoked   Stroke Brother     SOCIAL HISTORY:  Social History   Tobacco Use   Smoking status: Former    Packs/day: 0.50    Years: 46.00    Total pack years: 23.00    Types: Cigarettes    Quit date: 06/10/2022    Years since quitting: 0.0    Passive exposure: Past   Smokeless tobacco: Never  Vaping Use   Vaping Use: Never used  Substance Use Topics   Alcohol use: Not Currently   Drug use: Never    ALLERGIES:  Allergies  Allergen Reactions   Amoxicillin     itching  Compazine [Prochlorperazine]     Lose muscle control in face    Norvasc [Amlodipine] Swelling    Lower extremity swelling     Trelegy Ellipta [Fluticasone-Umeclidin-Vilant] Swelling    Swelling in legs and headache.    MEDICATIONS:  Current Outpatient Medications  Medication Sig Dispense Refill   acetaminophen (TYLENOL) 500 MG tablet Take 500 mg by mouth every 6 (six) hours as needed for moderate pain.     albuterol (PROVENTIL) (2.5 MG/3ML) 0.083% nebulizer solution Take 3 mLs (2.5 mg total) by nebulization every 4 (four) hours as  needed for wheezing or shortness of breath.     albuterol (VENTOLIN HFA) 108 (90 Base) MCG/ACT inhaler Inhale 2 puffs into the lungs every 6 (six) hours as needed for wheezing or shortness of breath. 18 g 1   ALPRAZolam (XANAX) 0.25 MG tablet Take 0.25 mg by mouth at bedtime as needed for anxiety.     Budeson-Glycopyrrol-Formoterol (BREZTRI AEROSPHERE) 160-9-4.8 MCG/ACT AERO INHALE 2 PUFFS BY MOUTH EVERY MORNING AND EVERY NIGHT AT BEDTIME 10.7 g 5   Homeopathic Products (LEG CRAMPS) TABS Take 2 tablets by mouth at bedtime as needed (leg cramps).     hydrochlorothiazide (HYDRODIURIL) 25 MG tablet Take 25 mg by mouth daily as needed (For fluid per patient).     loratadine (CLARITIN) 10 MG tablet Take 10 mg by mouth at bedtime.     metFORMIN (GLUCOPHAGE-XR) 500 MG 24 hr tablet Take 500 mg by mouth every morning.     telmisartan (MICARDIS) 20 MG tablet Take 1 tablet (20 mg total) by mouth at bedtime. 90 tablet 1   No current facility-administered medications for this encounter.    REVIEW OF SYSTEMS:  A 10+ POINT REVIEW OF SYSTEMS WAS OBTAINED including neurology, dermatology, psychiatry, cardiac, respiratory, lymph, extremities, GI, GU, musculoskeletal, constitutional, reproductive, HEENT. ***   PHYSICAL EXAM:  vitals were not taken for this visit.   General: Alert and oriented, in no acute distress HEENT: Head is normocephalic. Extraocular movements are intact. Oropharynx is clear. Neck: Neck is supple, no palpable cervical or supraclavicular lymphadenopathy. Heart: Regular in rate and rhythm with no murmurs, rubs, or gallops. Chest: Clear to auscultation bilaterally, with no rhonchi, wheezes, or rales. Abdomen: Soft, nontender, nondistended, with no rigidity or guarding. Extremities: No cyanosis or edema. Lymphatics: see Neck Exam Skin: No concerning lesions. Musculoskeletal: symmetric strength and muscle tone throughout. Neurologic: Cranial nerves II through XII are grossly intact. No  obvious focalities. Speech is fluent. Coordination is intact. Psychiatric: Judgment and insight are intact. Affect is appropriate. ***  ECOG = ***  0 - Asymptomatic (Fully active, able to carry on all predisease activities without restriction)  1 - Symptomatic but completely ambulatory (Restricted in physically strenuous activity but ambulatory and able to carry out work of a light or sedentary nature. For example, light housework, office work)  2 - Symptomatic, <50% in bed during the day (Ambulatory and capable of all self care but unable to carry out any work activities. Up and about more than 50% of waking hours)  3 - Symptomatic, >50% in bed, but not bedbound (Capable of only limited self-care, confined to bed or chair 50% or more of waking hours)  4 - Bedbound (Completely disabled. Cannot carry on any self-care. Totally confined to bed or chair)  5 - Death   Eustace Pen MM, Creech RH, Tormey DC, et al. [phone]). "Toxicity and response criteria of the [facility]". [place] [facility] 5 (6): [phone]  LABORATORY DATA:  Lab Results  Component Value Date   WBC 6.7 06/21/2022   HGB 14.1 06/21/2022   HCT 42.2 06/21/2022   MCV 95.5 06/21/2022   PLT 229 06/21/2022   NEUTROABS 4.3 06/21/2022   Lab Results  Component Value Date   NA 138 06/21/2022   K 3.9 06/21/2022   CL 107 06/21/2022   CO2 24 06/21/2022   GLUCOSE 128 (H) 06/21/2022   BUN 20 06/21/2022   CREATININE 0.59 06/21/2022   CALCIUM 9.0 06/21/2022      RADIOGRAPHY: [place] FINE NEEDLE ASP 1ST LESION  Result Date: 06/14/2022 INDICATION: ADD ON CODE, SEE PRIOR DICTATION EXAM: ADD ON CODE, SEE PRIOR DICTATION MEDICATIONS: None. ANESTHESIA/SEDATION: ADD ON CODE, SEE PRIOR DICTATION COMPLICATIONS: ADD ON CODE, SEE PRIOR DICTATION PROCEDURE: ADD ON CODE, SEE PRIOR DICTATION IMPRESSION: ADD ON CODE, SEE PRIOR DICTATION Electronically Signed   By: Jacqulynn Cadet M.D.   On: 06/14/2022 16:34   [place] CORE BIOPSY (LYMPH  NODES)  Result Date: 06/13/2022 INDICATION: 81 year old with a suspicious left upper lobe lesion and hypermetabolic lymph nodes. Tissue diagnosis is needed. EXAM: ULTRASOUND-GUIDED FINE NEEDLE ASPIRATION AND CORE BIOPSY OF LEFT SUPRACLAVICULAR LYMPH NODE MEDICATIONS: Moderate sedation ANESTHESIA/SEDATION: Moderate (conscious) sedation was employed during this procedure. A total of Versed 1 mg and Fentanyl 75 mcg was administered intravenously by the radiology nurse. Total intra-service moderate Sedation Time: 40 minutes. The patient's level of consciousness and vital signs were monitored continuously by radiology nursing throughout the procedure under my direct supervision. FLUOROSCOPY TIME:  None COMPLICATIONS: None immediate. PROCEDURE: Informed written consent was obtained from the patient after a thorough discussion of the procedural risks, benefits and alternatives. All questions were addressed. A timeout was performed prior to the initiation of the procedure. The left supraclavicular area was thoroughly evaluated with ultrasound. A left supraclavicular lymph node was identified and targeted. The skin was prepped with chlorhexidine and sterile field was created. Skin was anesthetized using 1% lidocaine. Using ultrasound guidance, a total of 7 fine-needle aspirations were obtained with 25 gauge needles. Subsequently, 3 core biopsies were obtained using ultrasound guidance with 18 gauge core device. Core specimens placed in saline. Bandage placed over the puncture site. FINDINGS: There are several small hypoechoic lymph nodes in left supraclavicular region. Lymph nodes lateral to the left thyroid lobe are not amendable to biopsy due to patient's respirations and the location posterior to the left internal jugular vein. However, a lymph node just anterior to the left subclavian artery was safe for biopsy. Fine-needle aspirations and core biopsies were obtained from this lymph node. No immediate bleeding or  hematoma formation. IMPRESSION: Ultrasound-guided fine needle aspirations and core biopsies of a left supraclavicular lymph node. Electronically Signed   By: Markus Daft M.D.   On: 06/13/2022 17:52      IMPRESSION: Stage IIIb (T2 a, N3, M0) non-small cell lung cancer, adenocarcinoma presented with left upper lobe perihilar mass in addition to left hilar and mediastinal lymphadenopathy as well as left supraclavicular lymphadenopathy  ***  Today, I talked to the patient and family about the findings and work-up thus far.  We discussed the natural history of *** and general treatment, highlighting the role of radiotherapy in the management.  We discussed the available radiation techniques, and focused on the details of logistics and delivery.  We reviewed the anticipated acute and late sequelae associated with radiation in this setting.  The patient was encouraged to ask questions that I answered to the best of my  ability. *** A patient consent form was discussed and signed.  We retained a copy for our records.  The patient would like to proceed with radiation and will be scheduled for CT simulation.  PLAN: ***    *** minutes of total time was spent for this patient encounter, including preparation, face-to-face counseling with the patient and coordination of care, physical exam, and documentation of the encounter.   ------------------------------------------------  Blair Promise, PhD, MD  This document serves as a record of services personally performed by Gery Pray, MD. It was created on his behalf by Roney Mans, a trained medical scribe. The creation of this record is based on the scribe's personal observations and the provider's statements to them. This document has been checked and approved by the attending provider.

## 2022-06-25 NOTE — Progress Notes (Signed)
Thoracic Location of Tumor / Histology: stage IIIb (T2 a, N3, M0) non-small cell lung cancer, adenocarcinoma presented with left upper lobe perihilar mass in addition to left hilar and mediastinal lymphadenopathy as well as left supraclavicular lymphadenopathy  Patient presented {numbers 1-12:19994} months ago with symptoms of: ***  Biopsies of revealed:   06/13/2022 A. LYMPH NODE, LEFT SUPRACLAVICULAR, FINE NEEDLE  ASPIRATION:   FINAL MICROSCOPIC DIAGNOSIS:  - Malignant cells consistent with non-small cell carcinoma   FINAL MICROSCOPIC DIAGNOSIS:   A. LYMPH NODE, LEFT SUPRACLAVICULAR, BIOPSY:  -  Metastatic poorly differentiated non-small cell carcinoma.   Tobacco/Marijuana/Snuff/ETOH use: smoked 0.5 pack/day for around 63 years and quit a week ago  Past/Anticipated interventions by cardiothoracic surgery, if any: {:18581}  Past/Anticipated interventions by medical oncology, if any: concurrent chemoradiation with weekly carboplatin for AUC of 2 and paclitaxel 45 Mg/M2 followed by consolidation immunotherapy. First infusion scheduled for 07/01/2022.  Signs/Symptoms Weight changes, if any: {:18581} Respiratory complaints, if any: {:18581} Hemoptysis, if any: {:18581} Pain issues, if any:  {:18581}  SAFETY ISSUES: Prior radiation? {:18581} Pacemaker/ICD? {:18581}  Possible current pregnancy?no Is the patient on methotrexate? {:18581}  Current Complaints / other details:  ***

## 2022-06-26 ENCOUNTER — Ambulatory Visit
Admission: RE | Admit: 2022-06-26 | Discharge: 2022-06-26 | Disposition: A | Discharge status: Home / Self Care | Payer: Medicare Other | Source: Ambulatory Visit | Attending: Radiation Oncology | Admitting: Radiation Oncology

## 2022-06-26 ENCOUNTER — Encounter: Payer: Self-pay | Admitting: Radiation Oncology

## 2022-06-26 ENCOUNTER — Encounter (HOSPITAL_COMMUNITY): Payer: Self-pay | Admitting: Internal Medicine

## 2022-06-26 ENCOUNTER — Other Ambulatory Visit: Payer: Self-pay

## 2022-06-26 VITALS — BP 161/54 | HR 69 | Temp 97.7°F | Resp 24 | Ht 66.0 in | Wt 197.8 lb

## 2022-06-26 DIAGNOSIS — C3490 Malignant neoplasm of unspecified part of unspecified bronchus or lung: Secondary | ICD-10-CM

## 2022-06-26 DIAGNOSIS — Z7984 Long term (current) use of oral hypoglycemic drugs: Secondary | ICD-10-CM | POA: Insufficient documentation

## 2022-06-26 DIAGNOSIS — Z8041 Family history of malignant neoplasm of ovary: Secondary | ICD-10-CM | POA: Insufficient documentation

## 2022-06-26 DIAGNOSIS — C3412 Malignant neoplasm of upper lobe, left bronchus or lung: Secondary | ICD-10-CM | POA: Diagnosis present

## 2022-06-26 DIAGNOSIS — Z87891 Personal history of nicotine dependence: Secondary | ICD-10-CM | POA: Insufficient documentation

## 2022-06-26 DIAGNOSIS — I10 Essential (primary) hypertension: Secondary | ICD-10-CM | POA: Insufficient documentation

## 2022-06-26 DIAGNOSIS — R490 Dysphonia: Secondary | ICD-10-CM | POA: Insufficient documentation

## 2022-06-26 DIAGNOSIS — Z801 Family history of malignant neoplasm of trachea, bronchus and lung: Secondary | ICD-10-CM | POA: Insufficient documentation

## 2022-06-26 DIAGNOSIS — C771 Secondary and unspecified malignant neoplasm of intrathoracic lymph nodes: Secondary | ICD-10-CM | POA: Insufficient documentation

## 2022-06-26 DIAGNOSIS — G4730 Sleep apnea, unspecified: Secondary | ICD-10-CM | POA: Diagnosis not present

## 2022-06-26 DIAGNOSIS — J449 Chronic obstructive pulmonary disease, unspecified: Secondary | ICD-10-CM | POA: Insufficient documentation

## 2022-06-26 HISTORY — DX: Chronic obstructive pulmonary disease, unspecified: J44.9

## 2022-06-26 HISTORY — DX: Malignant neoplasm of unspecified part of unspecified bronchus or lung: C34.90

## 2022-06-27 ENCOUNTER — Other Ambulatory Visit: Payer: Self-pay

## 2022-06-28 ENCOUNTER — Encounter: Payer: Self-pay | Admitting: Internal Medicine

## 2022-06-28 ENCOUNTER — Other Ambulatory Visit: Payer: Self-pay

## 2022-06-28 ENCOUNTER — Inpatient Hospital Stay: Payer: Medicare Other

## 2022-06-28 DIAGNOSIS — C3492 Malignant neoplasm of unspecified part of left bronchus or lung: Secondary | ICD-10-CM

## 2022-06-28 MED ORDER — ONDANSETRON HCL 8 MG PO TABS: 8.0000 mg | ORAL_TABLET | Freq: Three times a day (TID) | ORAL | 2 refills | Status: DC | PRN

## 2022-06-28 MED FILL — Dexamethasone Sodium Phosphate Inj 100 MG/10ML: INTRAMUSCULAR | Qty: 1 | Status: AC

## 2022-06-28 NOTE — Progress Notes (Signed)
Called pt to introduce myself as her Arboriculturist and to discuss the [employer].  I went over what it covers but she declined it at this time.  I will request the registration staff give her my card in case she changes her mind and for any questions or concerns she may have in the future.

## 2022-06-30 ENCOUNTER — Ambulatory Visit (HOSPITAL_COMMUNITY)
Admission: RE | Admit: 2022-06-30 | Discharge: 2022-06-30 | Disposition: A | Discharge status: Home / Self Care | Payer: Medicare Other | Source: Ambulatory Visit | Attending: Internal Medicine | Admitting: Internal Medicine

## 2022-06-30 DIAGNOSIS — C3490 Malignant neoplasm of unspecified part of unspecified bronchus or lung: Secondary | ICD-10-CM | POA: Insufficient documentation

## 2022-06-30 MED ORDER — GADOPICLENOL 0.5 MMOL/ML IV SOLN
9.0000 mL | Freq: Once | INTRAVENOUS | Status: AC | PRN
  Administered 2022-06-30: 9 mL via INTRAVENOUS

## 2022-07-01 ENCOUNTER — Inpatient Hospital Stay (HOSPITAL_BASED_OUTPATIENT_CLINIC_OR_DEPARTMENT_OTHER): Payer: Medicare Other | Admitting: Internal Medicine

## 2022-07-01 ENCOUNTER — Inpatient Hospital Stay: Payer: Medicare Other

## 2022-07-01 ENCOUNTER — Ambulatory Visit: Payer: Medicare Other | Admitting: Radiation Oncology

## 2022-07-01 ENCOUNTER — Other Ambulatory Visit: Payer: Self-pay

## 2022-07-01 DIAGNOSIS — C3412 Malignant neoplasm of upper lobe, left bronchus or lung: Secondary | ICD-10-CM | POA: Diagnosis not present

## 2022-07-01 DIAGNOSIS — C3490 Malignant neoplasm of unspecified part of unspecified bronchus or lung: Secondary | ICD-10-CM

## 2022-07-01 LAB — CBC WITH DIFFERENTIAL (CANCER CENTER ONLY)
Abs Immature Granulocytes: 0.02 10*3/uL (ref 0.00–0.07)
Basophils Absolute: 0.1 10*3/uL (ref 0.0–0.1)
Basophils Relative: 1 %
Eosinophils Absolute: 0.1 10*3/uL (ref 0.0–0.5)
Eosinophils Relative: 1 %
HCT: 40.6 % (ref 36.0–46.0)
Hemoglobin: 13.4 g/dL (ref 12.0–15.0)
Immature Granulocytes: 0 %
Lymphocytes Relative: 28 %
Lymphs Abs: 2.2 10*3/uL (ref 0.7–4.0)
MCH: 31.9 pg (ref 26.0–34.0)
MCHC: 33 g/dL (ref 30.0–36.0)
MCV: 96.7 fL (ref 80.0–100.0)
Monocytes Absolute: 0.6 10*3/uL (ref 0.1–1.0)
Monocytes Relative: 8 %
Neutro Abs: 4.8 10*3/uL (ref 1.7–7.7)
Neutrophils Relative %: 62 %
Platelet Count: 248 10*3/uL (ref 150–400)
RBC: 4.2 MIL/uL (ref 3.87–5.11)
RDW: 12.5 % (ref 11.5–15.5)
WBC Count: 7.8 10*3/uL (ref 4.0–10.5)
nRBC: 0 % (ref 0.0–0.2)

## 2022-07-01 LAB — CMP (CANCER CENTER ONLY)
ALT: 12 U/L (ref 0–44)
AST: 11 U/L — ABNORMAL LOW (ref 15–41)
Albumin: 4 g/dL (ref 3.5–5.0)
Alkaline Phosphatase: 57 U/L (ref 38–126)
Anion gap: 4 — ABNORMAL LOW (ref 5–15)
BUN: 11 mg/dL (ref 8–23)
CO2: 29 mmol/L (ref 22–32)
Calcium: 8.9 mg/dL (ref 8.9–10.3)
Chloride: 106 mmol/L (ref 98–111)
Creatinine: 0.63 mg/dL (ref 0.44–1.00)
GFR, Estimated: >60 mL/min (ref >60)
Glucose, Bld: 113 mg/dL — ABNORMAL HIGH (ref 70–99)
Potassium: 4.7 mmol/L (ref 3.5–5.1)
Sodium: 139 mmol/L (ref 135–145)
Total Bilirubin: 0.5 mg/dL (ref 0.3–1.2)
Total Protein: 6.4 g/dL — ABNORMAL LOW (ref 6.5–8.1)

## 2022-07-01 NOTE — Progress Notes (Signed)
[facility] [phone]   [phone]  OFFICE PROGRESS NOTE  [name], NP [place] [address] 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  The MRI of the brain showed suspicious brain lesion pending confirmation by the neuroradiologist.  Detected Alteration(s) / Biomarker(s) Associated FDA-approved therapies Clinical Trial Availability % cfDNA or Amplification  KRAS G12C approved by FDA Adagrasib, Sotorasib Yes 1.7%  TP53 R249S None Yes 1.5%  PRIOR THERAPY: None  CURRENT THERAPY: The patient was supposed to start a course of concurrent chemoradiation with weekly carboplatin for AUC of 2 and paclitaxel 45 Mg/M2 today but this will be delayed until next week.  INTERVAL HISTORY: [name] 81 y.o. female returns to the clinic today for follow-up visit accompanied by her husband.  The patient is feeling fine today with no concerning complaints except for the fatigue and tightness in her chest with shortness of breath with exertion mild cough with no hemoptysis.  She has no nausea, vomiting, diarrhea or constipation.  She has no headache or visual changes.  She denied having any weight loss or night sweats.  She had MRI of the brain performed recently but the final report is still pending.  She is scheduled for simulation for her radiotherapy tomorrow.  MEDICAL HISTORY: Past Medical History:  Diagnosis Date   Asthma    COPD (chronic obstructive pulmonary disease) ([place])    Hypertension    Lung cancer ([place])    Sleep apnea     ALLERGIES:  is allergic to amoxicillin, compazine [prochlorperazine], norvasc [amlodipine], and trelegy ellipta [fluticasone-umeclidin-vilant].  MEDICATIONS:  Current  Outpatient Medications  Medication Sig Dispense Refill   acetaminophen (TYLENOL) 500 MG tablet Take 500 mg by mouth every 6 (six) hours as needed for moderate pain.     albuterol (PROVENTIL) (2.5 MG/3ML) 0.083% nebulizer solution Take 3 mLs (2.5 mg total) by nebulization every 4 (four) hours as needed for wheezing or shortness of breath.     albuterol (VENTOLIN HFA) 108 (90 Base) MCG/ACT inhaler Inhale 2 puffs into the lungs every 6 (six) hours as needed for wheezing or shortness of breath. 18 g 1   ALPRAZolam (XANAX) 0.25 MG tablet Take 0.25 mg by mouth at bedtime as needed for anxiety.     Budeson-Glycopyrrol-Formoterol (BREZTRI AEROSPHERE) 160-9-4.8 MCG/ACT AERO INHALE 2 PUFFS BY MOUTH EVERY MORNING AND EVERY NIGHT AT BEDTIME 10.7 g 5   Homeopathic Products (LEG CRAMPS) TABS Take 2 tablets by mouth at bedtime as needed (leg cramps).     hydrochlorothiazide (HYDRODIURIL) 25 MG tablet Take 25 mg by mouth daily as needed (For fluid per patient). (Patient not taking: Reported on 06/26/2022)     loratadine (CLARITIN) 10 MG tablet Take 10 mg by mouth at bedtime.     metFORMIN (GLUCOPHAGE-XR) 500 MG 24 hr tablet Take 500 mg by mouth every morning. (Patient not taking: Reported on 06/26/2022)     ondansetron (ZOFRAN) 8 MG tablet Take 1 tablet (8 mg total) by mouth every 8 (eight) hours as needed for nausea or vomiting. 30 tablet 2   telmisartan (MICARDIS) 20 MG tablet Take 1 tablet (20 mg total) by mouth at bedtime. 90 tablet 1   No current facility-administered medications for this visit.  SURGICAL HISTORY:  Past Surgical History:  Procedure Laterality Date   APPENDECTOMY     BRONCHIAL BIOPSY  06/12/2021   Procedure: BRONCHIAL BIOPSIES;  Surgeon: Garner Nash, DO;  Location: [facility];  Service: Pulmonary;;   BRONCHIAL BRUSHINGS  06/12/2021   Procedure: BRONCHIAL BRUSHINGS;  Surgeon: Garner Nash, DO;  Location: [facility];  Service: Pulmonary;;   BRONCHIAL WASHINGS  06/12/2021    Procedure: BRONCHIAL WASHINGS;  Surgeon: Garner Nash, DO;  Location: [facility];  Service: Pulmonary;;   CHOLECYSTECTOMY     COLON RESECTION     FIDUCIAL MARKER PLACEMENT  06/12/2021   Procedure: FIDUCIAL MARKER PLACEMENT;  Surgeon: Garner Nash, DO;  Location: [facility];  Service: Pulmonary;;   hernia     x 2   TONSILLECTOMY     VIDEO BRONCHOSCOPY WITH ENDOBRONCHIAL NAVIGATION Bilateral 06/12/2021   Procedure: VIDEO BRONCHOSCOPY WITH ENDOBRONCHIAL NAVIGATION;  Surgeon: Garner Nash, DO;  Location: [facility];  Service: Pulmonary;  Laterality: Bilateral;  ION   VIDEO BRONCHOSCOPY WITH RADIAL ENDOBRONCHIAL ULTRASOUND  06/12/2021   Procedure: RADIAL ENDOBRONCHIAL ULTRASOUND;  Surgeon: Garner Nash, DO;  Location: [facility];  Service: Pulmonary;;    REVIEW OF SYSTEMS:  Constitutional: positive for fatigue Eyes: negative Ears, nose, mouth, throat, and face: negative Respiratory: positive for cough, dyspnea on exertion, and pleurisy/chest pain Cardiovascular: negative Gastrointestinal: negative Genitourinary:negative Integument/breast: negative Hematologic/lymphatic: negative Musculoskeletal:negative Neurological: negative Behavioral/Psych: negative Endocrine: negative Allergic/Immunologic: negative   PHYSICAL EXAMINATION: General appearance: alert, cooperative, fatigued, and no distress Head: Normocephalic, without obvious abnormality, atraumatic Neck: no adenopathy, no JVD, supple, symmetrical, trachea midline, and thyroid not enlarged, symmetric, no tenderness/mass/nodules Lymph nodes: Cervical, supraclavicular, and axillary nodes normal. Resp: clear to auscultation bilaterally Back: symmetric, no curvature. ROM normal. No CVA tenderness. Cardio: regular rate and rhythm, S1, S2 normal, no murmur, click, rub or gallop GI: soft, non-tender; bowel sounds normal; no masses,  no organomegaly Extremities: extremities normal, atraumatic, no cyanosis or  edema Neurologic: Alert and oriented X 3, normal strength and tone. Normal symmetric reflexes. Normal coordination and gait  ECOG PERFORMANCE STATUS: 1 - Symptomatic but completely ambulatory  Blood pressure (!) 152/55, pulse 74, temperature 98.1 F (36.7 C), temperature source Oral, resp. rate 16, weight 199 lb 3 oz (90.4 kg), SpO2 96 %.  LABORATORY DATA: Lab Results  Component Value Date   WBC 7.8 07/01/2022   HGB 13.4 07/01/2022   HCT 40.6 07/01/2022   MCV 96.7 07/01/2022   PLT 248 07/01/2022      Chemistry      Component Value Date/Time   NA 139 07/01/2022 1040   NA 139 09/17/2019 1550   K 4.7 07/01/2022 1040   CL 106 07/01/2022 1040   CO2 29 07/01/2022 1040   BUN 11 07/01/2022 1040   BUN 9 09/17/2019 1550   CREATININE 0.63 07/01/2022 1040      Component Value Date/Time   CALCIUM 8.9 07/01/2022 1040   ALKPHOS 57 07/01/2022 1040   AST 11 (L) 07/01/2022 1040   ALT 12 07/01/2022 1040   BILITOT 0.5 07/01/2022 1040       RADIOGRAPHIC STUDIES: [place] FINE NEEDLE ASP 1ST LESION  Result Date: 06/14/2022 INDICATION: ADD ON CODE, SEE PRIOR DICTATION EXAM: ADD ON CODE, SEE PRIOR DICTATION MEDICATIONS: None. ANESTHESIA/SEDATION: ADD ON CODE, SEE PRIOR DICTATION COMPLICATIONS: ADD ON CODE, SEE PRIOR DICTATION PROCEDURE: ADD ON CODE, SEE PRIOR DICTATION IMPRESSION: ADD ON CODE, SEE PRIOR DICTATION Electronically Signed   By: Myrle Sheng  Laurence Ferrari M.D.   On: 06/14/2022 16:34   [place] CORE BIOPSY (LYMPH NODES)  Result Date: 06/13/2022 INDICATION: 81 year old with a suspicious left upper lobe lesion and hypermetabolic lymph nodes. Tissue diagnosis is needed. EXAM: ULTRASOUND-GUIDED FINE NEEDLE ASPIRATION AND CORE BIOPSY OF LEFT SUPRACLAVICULAR LYMPH NODE MEDICATIONS: Moderate sedation ANESTHESIA/SEDATION: Moderate (conscious) sedation was employed during this procedure. A total of Versed 1 mg and Fentanyl 75 mcg was administered intravenously by the radiology nurse. Total intra-service moderate  Sedation Time: 40 minutes. The patient's level of consciousness and vital signs were monitored continuously by radiology nursing throughout the procedure under my direct supervision. FLUOROSCOPY TIME:  None COMPLICATIONS: None immediate. PROCEDURE: Informed written consent was obtained from the patient after a thorough discussion of the procedural risks, benefits and alternatives. All questions were addressed. A timeout was performed prior to the initiation of the procedure. The left supraclavicular area was thoroughly evaluated with ultrasound. A left supraclavicular lymph node was identified and targeted. The skin was prepped with chlorhexidine and sterile field was created. Skin was anesthetized using 1% lidocaine. Using ultrasound guidance, a total of 7 fine-needle aspirations were obtained with 25 gauge needles. Subsequently, 3 core biopsies were obtained using ultrasound guidance with 18 gauge core device. Core specimens placed in saline. Bandage placed over the puncture site. FINDINGS: There are several small hypoechoic lymph nodes in left supraclavicular region. Lymph nodes lateral to the left thyroid lobe are not amendable to biopsy due to patient's respirations and the location posterior to the left internal jugular vein. However, a lymph node just anterior to the left subclavian artery was safe for biopsy. Fine-needle aspirations and core biopsies were obtained from this lymph node. No immediate bleeding or hematoma formation. IMPRESSION: Ultrasound-guided fine needle aspirations and core biopsies of a left supraclavicular lymph node. Electronically Signed   By: Markus Daft M.D.   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 The molecular studies showed positive KRAS G12C mutation I had a lengthy discussion with the patient and her husband today about her current condition and treatment options.  The final report for the MRI of the brain is not available but by reviewing the imaging studies there is a suspicious right occipital lesion pending confirmation. I had a lengthy discussion with the patient today about her future treatment options. I recommend for the patient to hold on starting the first cycle of concurrent chemoradiation for now until confirmation of the brain metastasis since the patient may need either SRS or SRS/craniotomy and resection. If the patient has a confirmed solitary brain metastasis, we may consider treatment for the brain lesion followed by a course of concurrent chemoradiation with weekly carboplatin and paclitaxel as previously scheduled or treat the patient as a stage IV non-small cell lung cancer with a combination of systemic chemotherapy with carboplatin, Alimta and Keytruda. I will see her back for follow-up visit in 1 week before starting her treatment but regardless she will need a course of radiotherapy to the chest either with a curative intent or palliative intent. The patient will see Dr. Sondra Come tomorrow and have more discussion with him regarding her condition. She was advised to call immediately if she has any  other concerning issues in the interval. The patient voices understanding of current disease status and treatment options and is in agreement with the current care plan.  All questions were answered. The patient knows to call the clinic with any problems, questions or concerns. We can certainly see the patient much sooner if necessary.  The total time spent in the appointment was 40 minutes.  Disclaimer: This note was dictated with voice recognition software. Similar sounding words can inadvertently be  transcribed and may not be corrected upon review.

## 2022-07-02 ENCOUNTER — Encounter: Payer: Self-pay | Admitting: Internal Medicine

## 2022-07-02 ENCOUNTER — Other Ambulatory Visit: Payer: Self-pay | Admitting: Radiation Therapy

## 2022-07-02 ENCOUNTER — Ambulatory Visit: Payer: Medicare Other | Admitting: Radiation Oncology

## 2022-07-02 ENCOUNTER — Other Ambulatory Visit: Payer: Self-pay | Admitting: Radiation Oncology

## 2022-07-02 ENCOUNTER — Other Ambulatory Visit: Payer: Self-pay | Admitting: Internal Medicine

## 2022-07-02 ENCOUNTER — Other Ambulatory Visit: Payer: Self-pay | Admitting: Physician Assistant

## 2022-07-02 ENCOUNTER — Encounter (HOSPITAL_COMMUNITY): Payer: Self-pay | Admitting: Internal Medicine

## 2022-07-02 ENCOUNTER — Telehealth: Payer: Self-pay

## 2022-07-02 DIAGNOSIS — C7931 Secondary malignant neoplasm of brain: Secondary | ICD-10-CM

## 2022-07-02 DIAGNOSIS — C3412 Malignant neoplasm of upper lobe, left bronchus or lung: Secondary | ICD-10-CM

## 2022-07-02 DIAGNOSIS — C3490 Malignant neoplasm of unspecified part of unspecified bronchus or lung: Secondary | ICD-10-CM

## 2022-07-02 LAB — GUARDANT 360

## 2022-07-02 MED ORDER — DEXAMETHASONE 4 MG PO TABS: 4.0000 mg | ORAL_TABLET | Freq: Two times a day (BID) | ORAL | 0 refills | Status: DC

## 2022-07-02 MED ORDER — LORAZEPAM 1 MG PO TABS: 1.0000 mg | ORAL_TABLET | ORAL | 0 refills | Status: DC | PRN

## 2022-07-02 NOTE — Telephone Encounter (Signed)
Call received from pt wanting to cancel her lab appt on 07/08/22. She states she is also scheduled for an appt with Dr. Julien Nordmann and tx but "things have changed" and does not feel these appt are needed. She states she will be having surgery instead. I advised pt of what Dr. Worthy Flank notes from yesterday indicate the reason for the follow-up appt is. Pt reiterated the appts were not necessary because she has elected to have surgery and requests Dr. Julien Nordmann call her to revisit the plan   I reviewed progress note dated 07/01/22 which indicate "I recommend for the patient to hold on starting the first cycle of concurrent chemoradiation for now until confirmation of the brain metastasis since the patient may need either SRS or SRS/craniotomy and resection. If the patient has a confirmed solitary brain metastasis, we may consider treatment for the brain lesion followed by a course of concurrent chemoradiation with weekly carboplatin and paclitaxel as previously scheduled or treat the patient as a stage IV non-small cell lung cancer with a combination of systemic chemotherapy with carboplatin, Alimta and Keytruda. I will see her back for follow-up visit in 1 week before starting her treatment but regardless she will need a course of radiotherapy to the chest either with a curative intent or palliative intent. The patient will see Dr. Sondra Come tomorrow and have more discussion with him regarding her condition".

## 2022-07-03 ENCOUNTER — Encounter: Payer: Self-pay | Admitting: Internal Medicine

## 2022-07-03 ENCOUNTER — Other Ambulatory Visit: Payer: Self-pay

## 2022-07-03 ENCOUNTER — Telehealth: Payer: Self-pay | Admitting: Radiation Therapy

## 2022-07-03 ENCOUNTER — Other Ambulatory Visit: Payer: Self-pay | Admitting: Neurological Surgery

## 2022-07-03 NOTE — Telephone Encounter (Signed)
Left a voicemail for Laurie Mccoy to inform her of the chest simulation appointment on 9/28 @ 2:30 and her consult with Dr. Reatha Armour on 10/2 @ 1:00. I left these details in the message and requested a call back if she has any questions or concerns.  Mont Dutton R.T.(R)(T) Radiation Special Procedures Navigator

## 2022-07-04 ENCOUNTER — Ambulatory Visit (HOSPITAL_COMMUNITY)
Admission: RE | Admit: 2022-07-04 | Discharge: 2022-07-04 | Disposition: A | Discharge status: Home / Self Care | Payer: Medicare Other | Source: Ambulatory Visit | Attending: Radiation Oncology | Admitting: Radiation Oncology

## 2022-07-04 ENCOUNTER — Ambulatory Visit
Admission: RE | Admit: 2022-07-04 | Discharge: 2022-07-04 | Disposition: A | Discharge status: Home / Self Care | Payer: Medicare Other | Source: Ambulatory Visit | Attending: Radiation Oncology | Admitting: Radiation Oncology

## 2022-07-04 ENCOUNTER — Other Ambulatory Visit: Payer: Self-pay

## 2022-07-04 DIAGNOSIS — C3490 Malignant neoplasm of unspecified part of unspecified bronchus or lung: Secondary | ICD-10-CM | POA: Insufficient documentation

## 2022-07-04 MED ORDER — IOHEXOL 300 MG/ML  SOLN
100.0000 mL | Freq: Once | INTRAMUSCULAR | Status: AC | PRN
  Administered 2022-07-04: 100 mL via INTRAVENOUS

## 2022-07-04 MED ORDER — SODIUM CHLORIDE (PF) 0.9 % IJ SOLN
INTRAMUSCULAR | Status: AC
  Filled 2022-07-04: qty 50

## 2022-07-05 ENCOUNTER — Ambulatory Visit
Admission: RE | Admit: 2022-07-05 | Discharge: 2022-07-05 | Disposition: A | Discharge status: Home / Self Care | Payer: Medicare Other | Source: Ambulatory Visit | Attending: Radiation Oncology | Admitting: Radiation Oncology

## 2022-07-05 ENCOUNTER — Other Ambulatory Visit: Payer: Self-pay

## 2022-07-05 DIAGNOSIS — C7931 Secondary malignant neoplasm of brain: Secondary | ICD-10-CM

## 2022-07-05 MED ORDER — GADOBENATE DIMEGLUMINE 529 MG/ML IV SOLN
18.0000 mL | Freq: Once | INTRAVENOUS | Status: AC | PRN
  Administered 2022-07-05: 18 mL via INTRAVENOUS

## 2022-07-05 NOTE — Progress Notes (Signed)
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 Pieter Partridge Dawley  Scheduled for: Craniotomy for resection of tumor  Past or anticipated interventions, if any, per medical oncology:  Under care of Dr. Curt Bears 07/01/2022 The molecular studies showed positive KRAS G12C mutation The final report for the MRI of the brain is not available but by reviewing the imaging studies there is a suspicious right occipital lesion pending confirmation. I had a lengthy discussion with the patient today about her future treatment options. I recommend for the patient to hold on starting the first cycle of concurrent chemoradiation for now until confirmation of the brain metastasis since the patient may need either SRS or SRS/craniotomy and resection. If the patient has a confirmed solitary brain metastasis, we may consider treatment for the brain lesion followed by a course of concurrent chemoradiation with weekly carboplatin and paclitaxel as previously scheduled or treat the patient as a stage IV non-small cell lung cancer with a combination of systemic chemotherapy with carboplatin, Alimta and Keytruda. I will see her back for follow-up visit in 1 week before starting her treatment but regardless she will need a course of radiotherapy to the chest either with a curative intent or palliative intent.  Dose of Decadron, if applicable: Not scheduled to take 07/10/2022  Recent neurologic symptoms, if any:  Seizures: {:18581} Headaches: {:18581} Nausea: {:18581} Dizziness/ataxia:  {:18581} Difficulty with hand coordination: {:18581} Focal numbness/weakness: {:18581} Visual deficits/changes: {:18581} Confusion/Memory deficits: {:18581}  Additional Complaints / other details: ***She will start radiation under care of Dr. Sondra Come to her chest on 07/15/2022

## 2022-07-07 NOTE — Progress Notes (Signed)
Radiation Oncology         [phone] ________________________________  Outpatient Re-Consultation  Name: Laurie Mccoy MRN: [id number]  Date: 07/08/2022  DOB: [birth year]  CC:Laurie Booze, NP  Laurie Bears, MD   REFERRING PHYSICIAN: Curt Bears, MD  DIAGNOSIS:     ICD-10-CM   1. Metastasis to brain [facility])  C79.31     2. Secondary malignant neoplasm of brain ([place])  C79.31           Stage IIIb (T2 a, N3, M0) non-small cell lung cancer, adenocarcinoma presented with left upper lobe perihilar mass, left hilar and mediastinal lymphadenopathy, left supraclavicular lymphadenopathy, as well as new evidence of metastatic disease to the brain  HISTORY OF PRESENT ILLNESS::Laurie Mccoy is a 81 y.o. female who presented today for consideration of brain SRS in management of her metastatic left lung cancer.  Prior to her diagnosis of lung cancer, the patient was followed by pulmonology for suspicious RUL nodules in 2022. These were biopsied and negative for malignancy. The patient's recent history pertaining to her left lung cancer diagnosis and brain metastases are detailed as follows.   The patient presented to the [facility] ED on 02/13/22 for evaluation of back pain.  Patient stated that she was sitting down when she had sudden onset pain between the scapula.  She detailed that the pain was worse with deep breaths and any movement of the back. She also endorsed some mild associated chest pain but denied any diaphoresis, nausea, vomiting, numbness, tingling, weakness or other systemic symptoms. CXR performed in the ED showed a focal opacity within the left mid lung zone, appearing to be possibly infectious or inflammatory in etiology. CTA PE with and without contrast further revealed: a focal consolidation within the a lingula, in keeping with acute lobar pneumonia, and a slight interval increase in size but a decrease in density of a mixed solid and cystic nodule within the right upper  lobe measuring 11 mm x 16 mm (decrease in density likely correlated with post therapy changes).    Follow up super D chest CT w/o contrast on 03/29/22 showed: a slight interval increase in size of the masslike nodular opacity of the posterior perihilar left upper lobe measuring 3.1 x 2.6 cm, concerning for an enlarging primary lung malignancy; significant interval resolution of adjacent heterogeneous and ground-glass airspace opacity with some bandlike atelectasis or remaining consolidation (consistent with resolution of incidentally superimposed infection or inflammation); a nonspecific but new small pulmonary nodule of the right upper lobe measuring 0.4 cm modestly suspicious for small metastases or metachronous lung cancer; an additional nonspecific but new subsolid pulmonary nodule of the superior segment right lower lobe measuring 0.7 x 0.5 cm also modestly suspicious for small metastases or metachronous lung cancer; and no significant interval change of a subsolid nodule of the peripheral right upper lobe, measuring 1.4 x 1.1 cm. Other small nodules in the right lung were also without change in the interval.    The patient followed up with Dr. Melvyn Mccoy, [facility], on 04/04/22. During this visit, the patient reported "choppy" breathing and hoarse voice since having pneumonia this past May. In terms of CT findings, Dr. Melvyn Mccoy recommended proceeding with PET for further evaluation.    PET scan on 05/02/22 showed: the spiculated LUL perihilar mass as hypermetabolic and thus most in keeping with primary bronchogenic neoplasm. PET also showed: hypermetabolic left supraclavicular, left hilar and mediastinal lymph nodes consistent with disease involvement; interval stability of other small bilateral pulmonary nodules with  normal FDG avidity; and no significant abnormal FDG avidity in the previously biopsied sub-solid 12 mm right upper lobe pulmonary nodule. PET otherwise showed no convincing scintigraphic  evidence of hypermetabolic distant metastatic disease. Other findings of potential clinical significance appreciated included a non hypermetabolic 2.7 cm left thyroid nodule, and low-level FDG avidity associated with a linear band of soft tissue and nodularity in the anterior abdominal wall, favored to be inflammatory/postsurgical in etiology.   Subsequently, the patient underwent left supraclavicular lymph node biopsies on 06/13/22. Pathology revealed metastatic poorly differentiated non-small cell carcinoma (CK7, Napsin and TTF-1 positive, negative for CK20, CK5/6 and p40).    Accordingly, the patient was referred to Laurie Mccoy on 06/21/22 to discuss treatment options. During this visit, the patient denied any symptoms other than cough productive of clear sputum and nausea. Pending MRI of the brain to exclude metastatic disease (detailed below), Laurie Mccoy recommended proceeding with a course of concurrent chemoradiation with weekly carboplatin and paclitaxel, followed by consolidation immunotherapy if she has no evidence for disease progression. (For her nausea, Laurie Mccoy has prescribed her Zofran).   The patient was seen in consultation by Laurie Mccoy on 06/26/22 and had CT sim for radiation to the site in the left lung on 07/04/22.   MRI of the brain on 06/30/22 with and without contrast showed a 3.7 cm right occipital mass with moderate edema and a 4 mm left occipital lesion consistent with metastatic disease.     Chest CT with contrast on 07/04/22 showed mild enlargement of the left upper lobe perihilar mass and slight interval enlargement of the metastatic mediastinal lymph nodes. CT otherwise showed stability of the sub solid nodule in the right lung.   MRI of the brain on 07/05/22 showed no substantial change in bilateral occipital masses, compatible with metastases. MRI also showed additional peripheral enhancing 4 mm enhancing lesions in the lateral right temporal lobe also concerning for  metastatic disease.    Given findings in the brain, chemo will take place following SRS. Systemic treatment decision is pending further discussion with Laurie Mccoy.    Of note: patient has a family history significant for lung and brain cancer in her mother, lung cancer in her father, and ovarian and uterine cancer in her sister.    Recent neurologic symptoms, if any:  Seizures: No Headaches: None in the last week. Nausea: Little nausea today, thinks it is more nerves. Dizziness/ataxia: She feels a little off/awkward.  She reports feeling off balance when walking on occasions. Difficulty with hand coordination: No changes. 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 Visual deficits/changes: She reports having hard time tracking, she was seen by eye doctor and no visual changes noted. Confusion/Memory deficits: No   Additional Complaints / other details: She will start radiation under care of Laurie Mccoy to her chest on 07/15/2022   PREVIOUS RADIATION THERAPY: No  PAST MEDICAL HISTORY:  has a past medical history of Asthma, COPD (chronic obstructive pulmonary disease) ([place]), Hypertension, Lung cancer ([place]), and Sleep apnea.    PAST SURGICAL HISTORY: Past Surgical History:  Procedure Laterality Date   APPENDECTOMY     BRONCHIAL BIOPSY  06/12/2021   Procedure: BRONCHIAL BIOPSIES;  Surgeon: Garner Nash, DO;  Location: [place] [facility];  Service: Pulmonary;;   BRONCHIAL BRUSHINGS  06/12/2021   Procedure: BRONCHIAL BRUSHINGS;  Surgeon: Garner Nash, DO;  Location: [place] [facility];  Service: Pulmonary;;   BRONCHIAL WASHINGS  06/12/2021   Procedure: BRONCHIAL WASHINGS;  Surgeon: Garner Nash,  DO;  Location: [place] [facility];  Service: Pulmonary;;   CHOLECYSTECTOMY     COLON RESECTION     FIDUCIAL MARKER PLACEMENT  06/12/2021   Procedure: FIDUCIAL MARKER PLACEMENT;  Surgeon: Garner Nash, DO;  Location: [place] [facility];  Service: Pulmonary;;   hernia     x 2   TONSILLECTOMY      VIDEO BRONCHOSCOPY WITH ENDOBRONCHIAL NAVIGATION Bilateral 06/12/2021   Procedure: VIDEO BRONCHOSCOPY WITH ENDOBRONCHIAL NAVIGATION;  Surgeon: Garner Nash, DO;  Location: [place];  Service: Pulmonary;  Laterality: Bilateral;  ION   VIDEO BRONCHOSCOPY WITH RADIAL ENDOBRONCHIAL ULTRASOUND  06/12/2021   Procedure: RADIAL ENDOBRONCHIAL ULTRASOUND;  Surgeon: Garner Nash, DO;  Location: [place] [facility];  Service: Pulmonary;;    FAMILY HISTORY: family history includes Brain cancer in her mother; Lung cancer in her father and mother; Stroke in her brother.  SOCIAL HISTORY:  reports that she quit smoking about 4 weeks ago. Her smoking use included cigarettes. She has a 23.00 pack-year smoking history. She has been exposed to tobacco smoke. She has never used smokeless tobacco. She reports that she does not currently use alcohol. She reports that she does not use drugs.  ALLERGIES: Amoxicillin, Compazine [prochlorperazine], Norvasc [amlodipine], and Trelegy ellipta [fluticasone-umeclidin-vilant]  MEDICATIONS:  Current Outpatient Medications  Medication Sig Dispense Refill   acetaminophen (TYLENOL) 500 MG tablet Take 500 mg by mouth every 6 (six) hours as needed for moderate pain.     albuterol (PROVENTIL) (2.5 MG/3ML) 0.083% nebulizer solution Take 3 mLs (2.5 mg total) by nebulization every 4 (four) hours as needed for wheezing or shortness of breath.     albuterol (VENTOLIN HFA) 108 (90 Base) MCG/ACT inhaler Inhale 2 puffs into the lungs every 6 (six) hours as needed for wheezing or shortness of breath. 18 g 1   ALPRAZolam (XANAX) 0.25 MG tablet Take 0.25 mg by mouth at bedtime as needed for anxiety.     Budeson-Glycopyrrol-Formoterol (BREZTRI AEROSPHERE) 160-9-4.8 MCG/ACT AERO INHALE 2 PUFFS BY MOUTH EVERY MORNING AND EVERY NIGHT AT BEDTIME 10.7 g 5   dexamethasone (DECADRON) 4 MG tablet Take 1 tablet (4 mg total) by mouth 2 (two) times daily with a meal. Start the day of radiosurgery 4  tablet 0   Emollient (UDDERLY SMOOTH EXTRA CARE 20 EX) Apply 1 Application topically as needed (dry skin).     hydrochlorothiazide (HYDRODIURIL) 25 MG tablet Take 25 mg by mouth daily as needed (Leg swelling).     loratadine (CLARITIN) 10 MG tablet Take 10 mg by mouth at bedtime.     LORazepam (ATIVAN) 1 MG tablet Take 1 tablet (1 mg total) by mouth as needed. 30 min Prior to MRI or brain radiation procedure 5 tablet 0   ondansetron (ZOFRAN) 8 MG tablet Take 1 tablet (8 mg total) by mouth every 8 (eight) hours as needed for nausea or vomiting. 30 tablet 2   telmisartan (MICARDIS) 20 MG tablet Take 1 tablet (20 mg total) by mouth at bedtime. 90 tablet 1   metFORMIN (GLUCOPHAGE-XR) 500 MG 24 hr tablet Take 500 mg by mouth every morning. (Patient not taking: Reported on 06/26/2022)     No current facility-administered medications for this encounter.    REVIEW OF SYSTEMS:  As above.   PHYSICAL EXAM:  height is 5\' 6"  (1.676 m) and weight is 198 lb 12.8 oz (90.2 kg). Her temperature is 98.6 F (37 C). Her blood pressure is 144/51 (abnormal) and her pulse is 78. Her respiration is 20 and  oxygen saturation is 96%.   General: Alert and oriented, in no acute distress -she is ambulatory HEENT: Head is normocephalic. Extraocular movements are intact. Oropharynx is clear.  Visual quadrants are grossly intact Heart: Regular in rate and rhythm with no murmurs, rubs, or gallops. Chest: Clear to auscultation bilaterally, with no rhonchi, wheezes, or rales. Skin: No concerning lesions. Musculoskeletal: symmetric strength and muscle tone throughout. Neurologic: Cranial nerves II through XII are grossly intact. No obvious focalities. Speech is fluent. Coordination is intact. Psychiatric: Judgment and insight are intact. Affect is appropriate.  KPS = 80  100 - Normal; no complaints; no evidence of disease. 90   - Able to carry on normal activity; minor signs or symptoms of disease. 80   - Normal activity with  effort; some signs or symptoms of disease. 28   - Cares for self; unable to carry on normal activity or to do active work. 60   - Requires occasional assistance, but is able to care for most of his personal needs. 50   - Requires considerable assistance and frequent medical care. 71   - Disabled; requires special care and assistance. 12   - Severely disabled; hospital admission is indicated although death not imminent. 27   - Very sick; hospital admission necessary; active supportive treatment necessary. 10   - Moribund; fatal processes progressing rapidly. 0     - Dead  Karnofsky DA, [place], [place] and [place] [phone]) The use of the nitrogen mustards in the palliative treatment of carcinoma: with particular reference to bronchogenic carcinoma Cancer 1 [id number]   LABORATORY DATA:  Lab Results  Component Value Date   WBC 7.8 07/01/2022   HGB 13.4 07/01/2022   HCT 40.6 07/01/2022   MCV 96.7 07/01/2022   PLT 248 07/01/2022   CMP     Component Value Date/Time   NA 139 07/01/2022 1040   NA 139 09/17/2019 1550   K 4.7 07/01/2022 1040   CL 106 07/01/2022 1040   CO2 29 07/01/2022 1040   GLUCOSE 113 (H) 07/01/2022 1040   BUN 11 07/01/2022 1040   BUN 9 09/17/2019 1550   CREATININE 0.63 07/01/2022 1040   CALCIUM 8.9 07/01/2022 1040   PROT 6.4 (L) 07/01/2022 1040   ALBUMIN 4.0 07/01/2022 1040   AST 11 (L) 07/01/2022 1040   ALT 12 07/01/2022 1040   ALKPHOS 57 07/01/2022 1040   BILITOT 0.5 07/01/2022 1040   GFRNONAA >60 07/01/2022 1040   GFRAA 100 09/17/2019 1550         RADIOGRAPHY: CT Chest W Contrast  Result Date: 07/05/2022 CLINICAL DATA:  Non-small cell lung cancer. Pretreatment evaluation for lung cancer radiation. * Tracking Code: BO * EXAM: CT CHEST WITH CONTRAST TECHNIQUE: Multidetector CT imaging of the chest was performed during intravenous contrast administration. RADIATION DOSE REDUCTION: This exam was performed according to the departmental dose-optimization  program which includes automated exposure control, adjustment of the mA and/or kV according to patient size and/or use of iterative reconstruction technique. CONTRAST:  194mL OMNIPAQUE IOHEXOL 300 MG/ML  SOLN COMPARISON:  PET-CT PET-CT 05/02/2022, chest CT 03/29/2022 FINDINGS: Cardiovascular: No significant vascular findings. Normal heart size. No pericardial effusion. Mediastinum/Nodes: LEFT thyroid nodule measuring 2.6 cm does not have significant metabolic activity comparison PET scan. Subcarinal node measuring 12 mm is increased from 10 mm on comparison PET-CT scan. RIGHT lower paratracheal node also appears slightly more full at 14 mm compared to 12 mm. Small supraclavicular nodes scribed on PET-CT are not  pathologically enlarged. High RIGHT paratracheal node measuring 10 mm is more prominent. . Lungs/Pleura: [place] LEFT upper lobe perihilar mass measures 4.5 x 2.9 cm compared with 3.1 by 2.6 cm. Visually the mass appears slightly enlarged compared to prior. No new pulmonary nodules are identified. Sub solid nodule with a central metallic density in the RIGHT upper lobe measures 15 mm by 13 mm compared to 14 mm x 10 mm for no significant oval change Upper Abdomen: Limited view of the liver, kidneys, pancreas are unremarkable. Normal adrenal glands. Musculoskeletal: No aggressive osseous lesion. IMPRESSION: 1. Mild enlargement of LEFT upper lobe perihilar mass. 2. Slight interval enlargement metastatic mediastinal lymph nodes. 3. Stable sub solid nodule in the RIGHT lung. Electronically Signed   By: Suzy Bouchard M.D.   On: 07/05/2022 15:18   MR Brain W Wo Contrast  Result Date: 07/05/2022 CLINICAL DATA:  Brain/CNS neoplasm, staging 3T SRS Protocol and surgical navigation MRI for Pre-OP SRS Treatment planning MRI EXAM: MRI HEAD WITHOUT AND WITH CONTRAST TECHNIQUE: Multiplanar, multiecho pulse sequences of the brain and surrounding structures were obtained without and with intravenous contrast. CONTRAST:   75mL MULTIHANCE GADOBENATE DIMEGLUMINE 529 MG/ML IV SOLN COMPARISON:  Head 06/30/2022. FINDINGS: Brain: No substantial change in an approximately 3.8 x 3.2 cm heterogeneously enhancing right atypical mass. Similar additional 5 mm left occipital lobe. Additional peripheral enhancing 4 mm enhancing lesion in the lateral right temporal lobe (series 14, image 66). Edema surrounding these lesions without midline shift. No hydrocephalus or acute hemorrhage. Scattered T2/FLAIR hyperintensities within the white matter, nonspecific but compatible with chronic microvascular ischemic disease. Vascular: Major arterial flow voids are maintained skull base. Skull and upper cervical spine: Normal marrow signal. Sinuses/Orbits: Negative. Other: No mastoid effusions. IMPRESSION: 1. No substantial change in bilateral occipital masses, compatible with metastases. 2. Additional peripheral enhancing 4 mm enhancing lesion in the lateral right temporal lobe (series 14, image 66), also concerning for a metastasis. Electronically Signed   By: Margaretha Sheffield M.D.   On: 07/05/2022 15:08   MR BRAIN W WO CONTRAST  Result Date: 07/01/2022 CLINICAL DATA:  Non-small cell lung cancer (NSCLC), staging. EXAM: MRI HEAD WITHOUT AND WITH CONTRAST TECHNIQUE: Multiplanar, multiecho pulse sequences of the brain and surrounding structures were obtained without and with intravenous contrast. CONTRAST:  9 mL [employer] COMPARISON:  None Available. FINDINGS: Brain: There is a 3.7 x 3.1 cm mixed cystic and solid mass in the right occipital lobe with moderate surrounding vasogenic edema resulting in mild mass effect on the posterior aspect of the right lateral ventricle. A ring-enhancing lesion in the left occipital lobe measures 4 mm with minimal edema. No acute infarct, midline shift, or extra-axial fluid collection is evident. Small T2 hyperintensities in the cerebral white matter bilaterally are nonspecific but compatible with mild chronic small vessel  ischemic disease. Mild cerebral atrophy is within normal limits for age. A developmental venous anomaly is noted in the posterior left frontal lobe, likely with an associated cavernoma along the posterior margin of the body of the left lateral ventricle. Vascular: Major intracranial vascular flow voids are preserved. Skull and upper cervical spine: Unremarkable bone marrow signal. Sinuses/Orbits: Unremarkable orbits. Paranasal sinuses and mastoid air cells are clear. Other: None. IMPRESSION: 1. 3.7 cm right occipital mass with moderate edema and 4 mm left occipital lesion, consistent with metastases. 2. Mild chronic small vessel ischemic disease. Electronically Signed   By: Logan Bores M.D.   On: 07/01/2022 15:47   [place] FINE NEEDLE ASP 1ST  LESION  Result Date: 06/14/2022 INDICATION: ADD ON CODE, SEE PRIOR DICTATION EXAM: ADD ON CODE, SEE PRIOR DICTATION MEDICATIONS: None. ANESTHESIA/SEDATION: ADD ON CODE, SEE PRIOR DICTATION COMPLICATIONS: ADD ON CODE, SEE PRIOR DICTATION PROCEDURE: ADD ON CODE, SEE PRIOR DICTATION IMPRESSION: ADD ON CODE, SEE PRIOR DICTATION Electronically Signed   By: Jacqulynn Cadet M.D.   On: 06/14/2022 16:34   [place] CORE BIOPSY (LYMPH NODES)  Result Date: 06/13/2022 INDICATION: 81 year old with a suspicious left upper lobe lesion and hypermetabolic lymph nodes. Tissue diagnosis is needed. EXAM: ULTRASOUND-GUIDED FINE NEEDLE ASPIRATION AND CORE BIOPSY OF LEFT SUPRACLAVICULAR LYMPH NODE MEDICATIONS: Moderate sedation ANESTHESIA/SEDATION: Moderate (conscious) sedation was employed during this procedure. A total of Versed 1 mg and Fentanyl 75 mcg was administered intravenously by the radiology nurse. Total intra-service moderate Sedation Time: 40 minutes. The patient's level of consciousness and vital signs were monitored continuously by radiology nursing throughout the procedure under my direct supervision. FLUOROSCOPY TIME:  None COMPLICATIONS: None immediate. PROCEDURE: Informed written  consent was obtained from the patient after a thorough discussion of the procedural risks, benefits and alternatives. All questions were addressed. A timeout was performed prior to the initiation of the procedure. The left supraclavicular area was thoroughly evaluated with ultrasound. A left supraclavicular lymph node was identified and targeted. The skin was prepped with chlorhexidine and sterile field was created. Skin was anesthetized using 1% lidocaine. Using ultrasound guidance, a total of 7 fine-needle aspirations were obtained with 25 gauge needles. Subsequently, 3 core biopsies were obtained using ultrasound guidance with 18 gauge core device. Core specimens placed in saline. Bandage placed over the puncture site. FINDINGS: There are several small hypoechoic lymph nodes in left supraclavicular region. Lymph nodes lateral to the left thyroid lobe are not amendable to biopsy due to patient's respirations and the location posterior to the left internal jugular vein. However, a lymph node just anterior to the left subclavian artery was safe for biopsy. Fine-needle aspirations and core biopsies were obtained from this lymph node. No immediate bleeding or hematoma formation. IMPRESSION: Ultrasound-guided fine needle aspirations and core biopsies of a left supraclavicular lymph node. Electronically Signed   By: Markus Daft M.D.   On: 06/13/2022 17:52      IMPRESSION/PLAN: This is a very pleasant 81 year old woman with metastatic disease to the brain.  I had a lengthy discussion with the patient after reviewing their MRI results with them.  I recommend radiosurgery preoperatively to the 3 known metastases in her brain.  The largest metastasis will be resected for local control and symptom control.  This is in keeping with the consensus from our brain tumor conference this morning. After lengthy discussion, the patient would like to proceed with stereotactic brain radiosurgery to their metastatic disease as  recommended.    Consent was signed today after a lengthy discussion regarding the risks and side effects of radiosurgery which include but are not necessarily limited to headache, nausea, dizziness, injury to brain, seizure, hair loss, inflammation or radiation necrosis of the brain. We spoke about whole brain radiotherapy versus stereotactic radiosurgery to the brain. We spoke about the differing risks benefits and side effects of both of these treatments. Whole brain radiotherapy is more comprehensive and therefore can decrease the chance of recurrences elsewhere in the brain, while stereotactic radiosurgery only treats the areas of gross disease while sparing the rest of the brain parenchyma.  Given the limited number of metastases in her brain I think that targeted stereotactic radiosurgery is in  her best interest.  CT simulation will take place today and treatment 2 days from now.    On date of service, in total, I spent 45 minutes on this encounter. Patient was seen in person.   __________________________________________   Eppie Gibson, MD  This document serves as a record of services personally performed by Eppie Gibson, MD. It was created on her behalf by Roney Mans, a trained medical scribe. The creation of this record is based on the scribe's personal observations and the provider's statements to them. This document has been checked and approved by the attending provider.

## 2022-07-08 ENCOUNTER — Other Ambulatory Visit: Payer: Medicare Other

## 2022-07-08 ENCOUNTER — Ambulatory Visit
Admission: RE | Admit: 2022-07-08 | Discharge: 2022-07-08 | Disposition: A | Discharge status: Home / Self Care | Payer: Medicare Other | Source: Ambulatory Visit | Attending: Radiation Oncology | Admitting: Radiation Oncology

## 2022-07-08 ENCOUNTER — Telehealth: Payer: Self-pay | Admitting: Internal Medicine

## 2022-07-08 ENCOUNTER — Ambulatory Visit: Payer: Medicare Other

## 2022-07-08 ENCOUNTER — Inpatient Hospital Stay: Payer: Medicare Other

## 2022-07-08 ENCOUNTER — Encounter: Payer: Self-pay | Admitting: Radiation Oncology

## 2022-07-08 ENCOUNTER — Ambulatory Visit: Payer: Medicare Other | Admitting: Internal Medicine

## 2022-07-08 ENCOUNTER — Other Ambulatory Visit: Payer: Self-pay

## 2022-07-08 VITALS — BP 144/51 | HR 78 | Temp 98.6°F | Resp 20 | Ht 66.0 in | Wt 198.8 lb

## 2022-07-08 DIAGNOSIS — Z801 Family history of malignant neoplasm of trachea, bronchus and lung: Secondary | ICD-10-CM | POA: Insufficient documentation

## 2022-07-08 DIAGNOSIS — R270 Ataxia, unspecified: Secondary | ICD-10-CM | POA: Insufficient documentation

## 2022-07-08 DIAGNOSIS — R451 Restlessness and agitation: Secondary | ICD-10-CM | POA: Insufficient documentation

## 2022-07-08 DIAGNOSIS — R12 Heartburn: Secondary | ICD-10-CM | POA: Insufficient documentation

## 2022-07-08 DIAGNOSIS — Z7984 Long term (current) use of oral hypoglycemic drugs: Secondary | ICD-10-CM | POA: Insufficient documentation

## 2022-07-08 DIAGNOSIS — F419 Anxiety disorder, unspecified: Secondary | ICD-10-CM | POA: Insufficient documentation

## 2022-07-08 DIAGNOSIS — Z7952 Long term (current) use of systemic steroids: Secondary | ICD-10-CM | POA: Insufficient documentation

## 2022-07-08 DIAGNOSIS — I10 Essential (primary) hypertension: Secondary | ICD-10-CM | POA: Insufficient documentation

## 2022-07-08 DIAGNOSIS — Z880 Allergy status to penicillin: Secondary | ICD-10-CM | POA: Insufficient documentation

## 2022-07-08 DIAGNOSIS — Z9049 Acquired absence of other specified parts of digestive tract: Secondary | ICD-10-CM | POA: Insufficient documentation

## 2022-07-08 DIAGNOSIS — R5383 Other fatigue: Secondary | ICD-10-CM | POA: Insufficient documentation

## 2022-07-08 DIAGNOSIS — J449 Chronic obstructive pulmonary disease, unspecified: Secondary | ICD-10-CM | POA: Insufficient documentation

## 2022-07-08 DIAGNOSIS — R001 Bradycardia, unspecified: Secondary | ICD-10-CM | POA: Insufficient documentation

## 2022-07-08 DIAGNOSIS — R42 Dizziness and giddiness: Secondary | ICD-10-CM | POA: Insufficient documentation

## 2022-07-08 DIAGNOSIS — G629 Polyneuropathy, unspecified: Secondary | ICD-10-CM | POA: Insufficient documentation

## 2022-07-08 DIAGNOSIS — Z87891 Personal history of nicotine dependence: Secondary | ICD-10-CM | POA: Insufficient documentation

## 2022-07-08 DIAGNOSIS — R059 Cough, unspecified: Secondary | ICD-10-CM | POA: Insufficient documentation

## 2022-07-08 DIAGNOSIS — C7931 Secondary malignant neoplasm of brain: Secondary | ICD-10-CM | POA: Insufficient documentation

## 2022-07-08 DIAGNOSIS — R110 Nausea: Secondary | ICD-10-CM | POA: Insufficient documentation

## 2022-07-08 DIAGNOSIS — Z923 Personal history of irradiation: Secondary | ICD-10-CM | POA: Insufficient documentation

## 2022-07-08 DIAGNOSIS — Z888 Allergy status to other drugs, medicaments and biological substances status: Secondary | ICD-10-CM | POA: Insufficient documentation

## 2022-07-08 DIAGNOSIS — G4730 Sleep apnea, unspecified: Secondary | ICD-10-CM | POA: Insufficient documentation

## 2022-07-08 DIAGNOSIS — I6782 Cerebral ischemia: Secondary | ICD-10-CM | POA: Insufficient documentation

## 2022-07-08 DIAGNOSIS — R609 Edema, unspecified: Secondary | ICD-10-CM | POA: Insufficient documentation

## 2022-07-08 DIAGNOSIS — C3412 Malignant neoplasm of upper lobe, left bronchus or lung: Secondary | ICD-10-CM | POA: Insufficient documentation

## 2022-07-08 DIAGNOSIS — C778 Secondary and unspecified malignant neoplasm of lymph nodes of multiple regions: Secondary | ICD-10-CM | POA: Insufficient documentation

## 2022-07-08 DIAGNOSIS — Z79899 Other long term (current) drug therapy: Secondary | ICD-10-CM | POA: Insufficient documentation

## 2022-07-08 DIAGNOSIS — G9389 Other specified disorders of brain: Secondary | ICD-10-CM | POA: Insufficient documentation

## 2022-07-08 DIAGNOSIS — E041 Nontoxic single thyroid nodule: Secondary | ICD-10-CM | POA: Insufficient documentation

## 2022-07-08 DIAGNOSIS — C3490 Malignant neoplasm of unspecified part of unspecified bronchus or lung: Secondary | ICD-10-CM | POA: Insufficient documentation

## 2022-07-08 DIAGNOSIS — Z87442 Personal history of urinary calculi: Secondary | ICD-10-CM | POA: Insufficient documentation

## 2022-07-08 DIAGNOSIS — Z510 Encounter for antineoplastic radiation therapy: Secondary | ICD-10-CM | POA: Insufficient documentation

## 2022-07-08 MED ORDER — SODIUM CHLORIDE 0.9% FLUSH
10.0000 mL | Freq: Once | INTRAVENOUS | Status: AC
  Administered 2022-07-08: 10 mL via INTRAVENOUS

## 2022-07-08 NOTE — Telephone Encounter (Signed)
Called patient regarding upcoming October and November appointments, patient is notified.  

## 2022-07-08 NOTE — Progress Notes (Signed)
Surgical Instructions    Your procedure is scheduled on Thursday October 5.  Report to [facility] Main Entrance "A" at 8:00 A.M., then check in with the Admitting office.  Call this number if you have problems the morning of surgery:  [phone]   If you have any questions prior to your surgery date call [phone]: Open Monday-Friday 8am-4pm If you experience any cold or flu symptoms such as cough, fever, chills, shortness of breath, etc. between now and your scheduled surgery, please notify us at the above number     Remember:  Do not eat after midnight the night before your surgery  You may drink clear liquids until 7:00 the morning of your surgery.   Clear liquids allowed are: Water, Non-Citrus Juices (without pulp), Carbonated Beverages, Clear Tea, Black Coffee ONLY (NO MILK, CREAM OR POWDERED CREAMER of any kind), and Gatorade    Take these medicines the morning of surgery with A SIP OF WATER:  Budeson-Glycopyrrol-Formoterol (BREZTRI AEROSPHERE)  dexamethasone (DECADRON)  IF NEEDED: acetaminophen (TYLENOL)  albuterol (PROVENTIL) albuterol (VENTOLIN HFA)  LORazepam (ATIVAN)  ondansetron (ZOFRAN)   Please bring all inhalers with you the day of surgery.   As of today, STOP taking any Aspirin (unless otherwise instructed by your surgeon) Aleve, Naproxen, Ibuprofen, Motrin, Advil, Goody's, BC's, all herbal medications, fish oil, and all vitamins.           Do not wear jewelry or makeup. Do not wear lotions, powders, perfumes or deodorant. Do not shave 48 hours prior to surgery.  Do not bring valuables to the hospital. [facility] is not responsible for any belongings or valuables.   Do not wear nail polish, gel polish, artificial nails, or any other type of covering on natural nails (fingers and toes) If you have artificial nails or gel coating that need to be removed by a nail salon, please have this removed prior to surgery. Artificial nails or gel coating may interfere  with anesthesia's ability to adequately monitor your vital signs.   Do NOT Smoke (Tobacco/Vaping)  24 hours prior to your procedure  If you use a CPAP at night, you may bring your mask for your overnight stay.   Contacts, glasses, hearing aids, dentures or partials may not be worn into surgery, please bring cases for these belongings   For patients admitted to the hospital, discharge time will be determined by your treatment team.   Patients discharged the day of surgery will not be allowed to drive home, and someone needs to stay with them for 24 hours.   SURGICAL WAITING ROOM VISITATION Patients having surgery or a procedure may have no more than 2 support people in the waiting area - these visitors may rotate.   Children under the age of 54 must have an adult with them who is not the patient. If the patient needs to stay at the hospital during part of their recovery, the visitor guidelines for inpatient rooms apply. Pre-op nurse will coordinate an appropriate time for 1 support person to accompany patient in pre-op.  This support person may not rotate.   Please refer to the [facility] website for the visitor guidelines for Inpatients (after your surgery is over and you are in a regular room).    Special instructions:    Oral Hygiene is also important to reduce your risk of infection.  Remember - BRUSH YOUR TEETH THE MORNING OF SURGERY WITH YOUR REGULAR TOOTHPASTE   [facility]- Preparing For Surgery  Before surgery, you  can play an important role. Because skin is not sterile, your skin needs to be as free of germs as possible. You can reduce the number of germs on your skin by washing with CHG (chlorahexidine gluconate) Soap before surgery.  CHG is an antiseptic cleaner which kills germs and bonds with the skin to continue killing germs even after washing.     Please do not use if you have an allergy to CHG or antibacterial soaps. If your skin becomes reddened/irritated stop using  the CHG.  Do not shave (including legs and underarms) for at least 48 hours prior to first CHG shower. It is OK to shave your face.  Please follow these instructions carefully.     Shower the NIGHT BEFORE SURGERY and the MORNING OF SURGERY with CHG Soap.   If you chose to wash your hair, wash your hair first as usual with your normal shampoo. After you shampoo, rinse your hair and body thoroughly to remove the shampoo.  Then [employer] and genitals (private parts) with your normal soap and rinse thoroughly to remove soap.  After that Use CHG Soap as you would any other liquid soap. You can apply CHG directly to the skin and wash gently with a scrungie or a clean washcloth.   Apply the CHG Soap to your body ONLY FROM THE NECK DOWN.  Do not use on open wounds or open sores. Avoid contact with your eyes, ears, mouth and genitals (private parts). Wash Face and genitals (private parts)  with your normal soap.   Wash thoroughly, paying special attention to the area where your surgery will be performed.  Thoroughly rinse your body with warm water from the neck down.  DO NOT shower/wash with your normal soap after using and rinsing off the CHG Soap.  Pat yourself dry with a CLEAN TOWEL.  Wear CLEAN PAJAMAS to bed the night before surgery  Place CLEAN SHEETS on your bed the night before your surgery  DO NOT SLEEP WITH PETS.   Day of Surgery:  Take a shower with CHG soap. Wear Clean/Comfortable clothing the morning of surgery Do not apply any deodorants/lotions.   Remember to brush your teeth WITH YOUR REGULAR TOOTHPASTE.    If you received a COVID test during your pre-op visit, it is requested that you wear a mask when out in public, stay away from anyone that may not be feeling well, and notify your surgeon if you develop symptoms. If you have been in contact with anyone that has tested positive in the last 10 days, please notify your surgeon.    Please read over the following fact  sheets that you were given.

## 2022-07-09 ENCOUNTER — Other Ambulatory Visit: Payer: Self-pay

## 2022-07-09 ENCOUNTER — Other Ambulatory Visit: Payer: Self-pay | Admitting: Neurological Surgery

## 2022-07-09 ENCOUNTER — Encounter (HOSPITAL_COMMUNITY): Payer: Self-pay

## 2022-07-09 ENCOUNTER — Encounter (HOSPITAL_COMMUNITY)
Admission: RE | Admit: 2022-07-09 | Discharge: 2022-07-09 | Disposition: A | Discharge status: Home / Self Care | Payer: Medicare Other | Source: Ambulatory Visit | Attending: Neurological Surgery | Admitting: Neurological Surgery

## 2022-07-09 DIAGNOSIS — Z01812 Encounter for preprocedural laboratory examination: Secondary | ICD-10-CM | POA: Insufficient documentation

## 2022-07-09 HISTORY — DX: Personal history of other diseases of the digestive system: Z87.19

## 2022-07-09 HISTORY — DX: Anxiety disorder, unspecified: F41.9

## 2022-07-09 NOTE — Progress Notes (Signed)
PCP - Bradly Bienenstock NP Cardiologist - O'Neill  PPM/ICD - denies  Chest x-ray - 02/13/22 EKG - 02/13/22 Stress Test - 09/23/19 ECHO - "a long time ago, in [place]" Cardiac Cath - denies  Sleep Study - yes CPAP - yes  No diabetes.  As of today, STOP taking any Aspirin (unless otherwise instructed by your surgeon) Aleve, Naproxen, Ibuprofen, Motrin, Advil, Goody's, BC's, all herbal medications, fish oil, and all vitamins.  ERAS Protcol - yes PRE-SURGERY Ensure or G2- n/a  COVID TEST- n/a  Anesthesia review: no  Patient denies shortness of breath, fever, cough and chest pain at PAT appointment  All instructions explained to the patient, with a verbal understanding of the material. Patient agrees to go over the instructions while at home for a better understanding. The opportunity to ask questions was provided.

## 2022-07-10 ENCOUNTER — Encounter: Payer: Self-pay | Admitting: Radiation Oncology

## 2022-07-10 ENCOUNTER — Other Ambulatory Visit: Payer: Self-pay

## 2022-07-10 ENCOUNTER — Ambulatory Visit
Admission: RE | Admit: 2022-07-10 | Discharge: 2022-07-10 | Disposition: A | Discharge status: Home / Self Care | Payer: Medicare Other | Source: Ambulatory Visit | Attending: Radiation Oncology | Admitting: Radiation Oncology

## 2022-07-10 LAB — RAD ONC ARIA SESSION SUMMARY
Course Elapsed Days: 0
Plan Fractions Treated to Date: 1
Plan Prescribed Dose Per Fraction: 16 Gy
Plan Total Fractions Prescribed: 1
Plan Total Prescribed Dose: 16 Gy
Reference Point Dosage Given to Date: 16 Gy
Reference Point Session Dosage Given: 16 Gy
Session Number: 1

## 2022-07-10 NOTE — Anesthesia Preprocedure Evaluation (Signed)
Anesthesia Evaluation  Patient identified by MRN, date of birth, ID band Patient awake    Reviewed: Allergy & Precautions, NPO status , Patient's Chart, lab work & pertinent test results  History of Anesthesia Complications Negative for: history of anesthetic complications  Airway Mallampati: III  TM Distance: >3 FB Neck ROM: Full    Dental no notable dental hx. (+) Dental Advisory Given   Pulmonary sleep apnea , COPD, former smoker Hx lung cancer   Pulmonary exam normal        Cardiovascular hypertension, Pt. on medications Normal cardiovascular exam     Neuro/Psych  PSYCHIATRIC DISORDERS Anxiety     Brain met    GI/Hepatic negative GI ROS, Neg liver ROS,,,  Endo/Other  negative endocrine ROS    Renal/GU negative Renal ROS     Musculoskeletal negative musculoskeletal ROS (+)    Abdominal   Peds  Hematology negative hematology ROS (+)   Anesthesia Other Findings   Reproductive/Obstetrics                             Anesthesia Physical Anesthesia Plan  ASA: 3  Anesthesia Plan: General   Post-op Pain Management: Celebrex PO (pre-op)* and Tylenol PO (pre-op)*   Induction: Intravenous  PONV Risk Score and Plan: 4 or greater and Ondansetron, Dexamethasone, Diphenhydramine and Treatment may vary due to age or medical condition  Airway Management Planned: Oral ETT  Additional Equipment: Arterial line  Intra-op Plan:   Post-operative Plan: Post-operative intubation/ventilation  Informed Consent: I have reviewed the patients History and Physical, chart, labs and discussed the procedure including the risks, benefits and alternatives for the proposed anesthesia with the patient or authorized representative who has indicated his/her understanding and acceptance.     Dental advisory given  Plan Discussed with: Anesthesiologist and CRNA  Anesthesia Plan Comments:          Anesthesia Quick Evaluation

## 2022-07-10 NOTE — Progress Notes (Signed)
Patient rested with [place] for 15 minutes following her SRS treatment.  Patient denies headache, dizziness, nausea, diplopia or ringing in the ears. Denies fatigue. Patient without complaints. Understands to avoid strenuous activity for the next 24 hours and call [phone] with needs.     BP (!) 145/73 (BP Location: Left Arm, Patient Position: Sitting, Cuff Size: Large)   Pulse (!) 56   Temp (!) 97.3 F (36.3 C)   Resp 20   SpO2 96%    Lucendia Leard M. Leonie Green, BSN

## 2022-07-11 ENCOUNTER — Other Ambulatory Visit: Payer: Self-pay

## 2022-07-11 ENCOUNTER — Inpatient Hospital Stay (HOSPITAL_COMMUNITY): Payer: Medicare Other | Admitting: Physician Assistant

## 2022-07-11 ENCOUNTER — Ambulatory Visit: Payer: Medicare Other | Admitting: Radiation Oncology

## 2022-07-11 ENCOUNTER — Other Ambulatory Visit: Payer: Self-pay | Admitting: Radiation Therapy

## 2022-07-11 ENCOUNTER — Inpatient Hospital Stay (HOSPITAL_COMMUNITY)
Admission: RE | Admit: 2022-07-11 | Discharge: 2022-07-13 | DRG: 025 | Disposition: A | Discharge status: Home Health | Payer: Medicare Other | Source: Ambulatory Visit | Attending: Neurological Surgery | Admitting: Neurological Surgery

## 2022-07-11 ENCOUNTER — Inpatient Hospital Stay (HOSPITAL_COMMUNITY): Payer: Medicare Other | Admitting: Anesthesiology

## 2022-07-11 ENCOUNTER — Encounter (HOSPITAL_COMMUNITY)
Admission: RE | Disposition: A | Discharge status: Home Health | Payer: Self-pay | Source: Ambulatory Visit | Attending: Neurological Surgery

## 2022-07-11 ENCOUNTER — Encounter (HOSPITAL_COMMUNITY): Payer: Self-pay | Admitting: Neurological Surgery

## 2022-07-11 DIAGNOSIS — Z880 Allergy status to penicillin: Secondary | ICD-10-CM | POA: Diagnosis not present

## 2022-07-11 DIAGNOSIS — Z87891 Personal history of nicotine dependence: Secondary | ICD-10-CM

## 2022-07-11 DIAGNOSIS — D496 Neoplasm of unspecified behavior of brain: Secondary | ICD-10-CM

## 2022-07-11 DIAGNOSIS — I959 Hypotension, unspecified: Secondary | ICD-10-CM | POA: Diagnosis not present

## 2022-07-11 DIAGNOSIS — C3412 Malignant neoplasm of upper lobe, left bronchus or lung: Secondary | ICD-10-CM | POA: Diagnosis present

## 2022-07-11 DIAGNOSIS — Z8049 Family history of malignant neoplasm of other genital organs: Secondary | ICD-10-CM

## 2022-07-11 DIAGNOSIS — C3490 Malignant neoplasm of unspecified part of unspecified bronchus or lung: Secondary | ICD-10-CM | POA: Diagnosis present

## 2022-07-11 DIAGNOSIS — Z79899 Other long term (current) drug therapy: Secondary | ICD-10-CM

## 2022-07-11 DIAGNOSIS — F410 Panic disorder [episodic paroxysmal anxiety] without agoraphobia: Secondary | ICD-10-CM | POA: Diagnosis present

## 2022-07-11 DIAGNOSIS — J449 Chronic obstructive pulmonary disease, unspecified: Secondary | ICD-10-CM

## 2022-07-11 DIAGNOSIS — Z888 Allergy status to other drugs, medicaments and biological substances status: Secondary | ICD-10-CM

## 2022-07-11 DIAGNOSIS — G936 Cerebral edema: Secondary | ICD-10-CM | POA: Diagnosis present

## 2022-07-11 DIAGNOSIS — Z9889 Other specified postprocedural states: Secondary | ICD-10-CM | POA: Diagnosis present

## 2022-07-11 DIAGNOSIS — C7932 Secondary malignant neoplasm of cerebral meninges: Secondary | ICD-10-CM | POA: Diagnosis present

## 2022-07-11 DIAGNOSIS — I10 Essential (primary) hypertension: Secondary | ICD-10-CM

## 2022-07-11 DIAGNOSIS — Z801 Family history of malignant neoplasm of trachea, bronchus and lung: Secondary | ICD-10-CM

## 2022-07-11 DIAGNOSIS — G629 Polyneuropathy, unspecified: Secondary | ICD-10-CM | POA: Diagnosis present

## 2022-07-11 DIAGNOSIS — Z7951 Long term (current) use of inhaled steroids: Secondary | ICD-10-CM

## 2022-07-11 DIAGNOSIS — C7931 Secondary malignant neoplasm of brain: Principal | ICD-10-CM | POA: Diagnosis present

## 2022-07-11 DIAGNOSIS — Z823 Family history of stroke: Secondary | ICD-10-CM | POA: Diagnosis not present

## 2022-07-11 DIAGNOSIS — C770 Secondary and unspecified malignant neoplasm of lymph nodes of head, face and neck: Secondary | ICD-10-CM | POA: Diagnosis present

## 2022-07-11 DIAGNOSIS — Z808 Family history of malignant neoplasm of other organs or systems: Secondary | ICD-10-CM | POA: Diagnosis not present

## 2022-07-11 HISTORY — PX: CRANIOTOMY: SHX93

## 2022-07-11 HISTORY — PX: APPLICATION OF CRANIAL NAVIGATION: SHX6578

## 2022-07-11 LAB — MRSA NEXT GEN BY PCR, NASAL: MRSA by PCR Next Gen: NOT DETECTED

## 2022-07-11 LAB — CREATININE, SERUM
Creatinine, Ser: 0.6 mg/dL (ref 0.44–1.00)
GFR, Estimated: >60 mL/min (ref >60)

## 2022-07-11 LAB — POCT I-STAT 7, (LYTES, BLD GAS, ICA,H+H)
Acid-base deficit: 5 mmol/L — ABNORMAL HIGH (ref 0.0–2.0)
Bicarbonate: 21.4 mmol/L (ref 20.0–28.0)
Calcium, Ion: 1.12 mmol/L — ABNORMAL LOW (ref 1.15–1.40)
HCT: 32 % — ABNORMAL LOW (ref 36.0–46.0)
Hemoglobin: 10.9 g/dL — ABNORMAL LOW (ref 12.0–15.0)
O2 Saturation: 100 %
Patient temperature: 35
Potassium: 3.5 mmol/L (ref 3.5–5.1)
Sodium: 133 mmol/L — ABNORMAL LOW (ref 135–145)
TCO2: 23 mmol/L (ref 22–32)
pCO2 arterial: 40.4 mmHg (ref 32–48)
pH, Arterial: 7.322 — ABNORMAL LOW (ref 7.35–7.45)
pO2, Arterial: 205 mmHg — ABNORMAL HIGH (ref 83–108)

## 2022-07-11 LAB — CBC
HCT: 36.2 % (ref 36.0–46.0)
Hemoglobin: 12 g/dL (ref 12.0–15.0)
MCH: 31.5 pg (ref 26.0–34.0)
MCHC: 33.1 g/dL (ref 30.0–36.0)
MCV: 95 fL (ref 80.0–100.0)
Platelets: 210 10*3/uL (ref 150–400)
RBC: 3.81 MIL/uL — ABNORMAL LOW (ref 3.87–5.11)
RDW: 12.4 % (ref 11.5–15.5)
WBC: 8.9 10*3/uL (ref 4.0–10.5)
nRBC: 0 % (ref 0.0–0.2)

## 2022-07-11 LAB — TYPE AND SCREEN
ABO/RH(D): O NEG
Antibody Screen: POSITIVE

## 2022-07-11 SURGERY — CRANIOTOMY TUMOR EXCISION
Anesthesia: General

## 2022-07-11 MED ORDER — BUDESON-GLYCOPYRROL-FORMOTEROL 160-9-4.8 MCG/ACT IN AERO
2.0000 | INHALATION_SPRAY | Freq: Two times a day (BID) | RESPIRATORY_TRACT | Status: DC
  Administered 2022-07-11 – 2022-07-13 (×4): 2 via RESPIRATORY_TRACT
  Filled 2022-07-11: qty 0.42

## 2022-07-11 MED ORDER — FENTANYL CITRATE (PF) 250 MCG/5ML IJ SOLN
INTRAMUSCULAR | Status: AC
  Filled 2022-07-11: qty 5

## 2022-07-11 MED ORDER — ACETAMINOPHEN 500 MG PO TABS
ORAL_TABLET | ORAL | Status: AC
  Administered 2022-07-11: 1000 mg via ORAL
  Filled 2022-07-11: qty 2

## 2022-07-11 MED ORDER — DOCUSATE SODIUM 100 MG PO CAPS
100.0000 mg | ORAL_CAPSULE | Freq: Two times a day (BID) | ORAL | Status: DC
  Administered 2022-07-11 – 2022-07-13 (×4): 100 mg via ORAL
  Filled 2022-07-11 (×4): qty 1

## 2022-07-11 MED ORDER — IRBESARTAN 150 MG PO TABS
75.0000 mg | ORAL_TABLET | Freq: Every day | ORAL | Status: DC
  Filled 2022-07-11: qty 1

## 2022-07-11 MED ORDER — ESMOLOL HCL 100 MG/10ML IV SOLN
INTRAVENOUS | Status: DC | PRN
  Administered 2022-07-11 (×2): 20 mg via INTRAVENOUS

## 2022-07-11 MED ORDER — ONDANSETRON HCL 4 MG/2ML IJ SOLN
INTRAMUSCULAR | Status: AC
  Filled 2022-07-11: qty 2

## 2022-07-11 MED ORDER — ORAL CARE MOUTH RINSE: 15.0000 mL | Freq: Once | OROMUCOSAL | Status: AC

## 2022-07-11 MED ORDER — THROMBIN 20000 UNITS EX KIT
PACK | CUTANEOUS | Status: DC | PRN
  Administered 2022-07-11: 20 mL via TOPICAL

## 2022-07-11 MED ORDER — LIDOCAINE 2% (20 MG/ML) 5 ML SYRINGE
INTRAMUSCULAR | Status: DC | PRN
  Administered 2022-07-11: 100 mg via INTRAVENOUS

## 2022-07-11 MED ORDER — ONDANSETRON HCL 4 MG PO TABS: 4.0000 mg | ORAL_TABLET | ORAL | Status: DC | PRN

## 2022-07-11 MED ORDER — LORATADINE 10 MG PO TABS
10.0000 mg | ORAL_TABLET | Freq: Every day | ORAL | Status: DC
  Administered 2022-07-11 – 2022-07-12 (×2): 10 mg via ORAL
  Filled 2022-07-11 (×2): qty 1

## 2022-07-11 MED ORDER — BUPIVACAINE-EPINEPHRINE (PF) 0.5% -1:200000 IJ SOLN
INTRAMUSCULAR | Status: DC | PRN
  Administered 2022-07-11: 5 mL

## 2022-07-11 MED ORDER — PROPOFOL 10 MG/ML IV BOLUS
INTRAVENOUS | Status: AC
  Filled 2022-07-11: qty 20

## 2022-07-11 MED ORDER — BACITRACIN ZINC 500 UNIT/GM EX OINT
TOPICAL_OINTMENT | CUTANEOUS | Status: AC
  Filled 2022-07-11: qty 28.35

## 2022-07-11 MED ORDER — MORPHINE SULFATE (PF) 2 MG/ML IV SOLN
1.0000 mg | INTRAVENOUS | Status: DC | PRN
  Administered 2022-07-11: 2 mg via INTRAVENOUS
  Administered 2022-07-12: 1 mg via INTRAVENOUS
  Filled 2022-07-11 (×2): qty 1

## 2022-07-11 MED ORDER — ALPRAZOLAM 0.5 MG PO TABS
0.2500 mg | ORAL_TABLET | Freq: Every evening | ORAL | Status: DC | PRN
  Filled 2022-07-11: qty 1

## 2022-07-11 MED ORDER — HYDROCODONE-ACETAMINOPHEN 5-325 MG PO TABS
1.0000 | ORAL_TABLET | ORAL | Status: DC | PRN
  Administered 2022-07-11 – 2022-07-12 (×2): 1 via ORAL
  Filled 2022-07-11 (×2): qty 1

## 2022-07-11 MED ORDER — PROPOFOL 10 MG/ML IV BOLUS
INTRAVENOUS | Status: DC | PRN
  Administered 2022-07-11: 50 mg via INTRAVENOUS
  Administered 2022-07-11: 30 mg via INTRAVENOUS
  Administered 2022-07-11: 120 mg via INTRAVENOUS
  Administered 2022-07-11 (×2): 30 mg via INTRAVENOUS

## 2022-07-11 MED ORDER — SODIUM CHLORIDE 0.9 % IV SOLN: INTRAVENOUS | Status: DC

## 2022-07-11 MED ORDER — LIDOCAINE-EPINEPHRINE 1 %-1:100000 IJ SOLN
INTRAMUSCULAR | Status: AC
  Filled 2022-07-11: qty 1

## 2022-07-11 MED ORDER — VANCOMYCIN HCL IN DEXTROSE 1-5 GM/200ML-% IV SOLN
1000.0000 mg | INTRAVENOUS | Status: AC
  Administered 2022-07-11: 1000 mg via INTRAVENOUS

## 2022-07-11 MED ORDER — ALBUTEROL SULFATE (2.5 MG/3ML) 0.083% IN NEBU: 2.5000 mg | INHALATION_SOLUTION | RESPIRATORY_TRACT | Status: DC | PRN

## 2022-07-11 MED ORDER — ONDANSETRON HCL 4 MG/2ML IJ SOLN
INTRAMUSCULAR | Status: DC | PRN
  Administered 2022-07-11: 4 mg via INTRAVENOUS

## 2022-07-11 MED ORDER — PROMETHAZINE HCL 25 MG/ML IJ SOLN: 6.2500 mg | INTRAMUSCULAR | Status: DC | PRN

## 2022-07-11 MED ORDER — CHLORHEXIDINE GLUCONATE CLOTH 2 % EX PADS: 6.0000 | MEDICATED_PAD | Freq: Once | CUTANEOUS | Status: DC

## 2022-07-11 MED ORDER — ACETAMINOPHEN 500 MG PO TABS: 1000.0000 mg | ORAL_TABLET | Freq: Once | ORAL | Status: AC

## 2022-07-11 MED ORDER — ROCURONIUM BROMIDE 10 MG/ML (PF) SYRINGE
PREFILLED_SYRINGE | INTRAVENOUS | Status: DC | PRN
  Administered 2022-07-11 (×2): 50 mg via INTRAVENOUS

## 2022-07-11 MED ORDER — LABETALOL HCL 5 MG/ML IV SOLN: 10.0000 mg | INTRAVENOUS | Status: DC | PRN

## 2022-07-11 MED ORDER — CELECOXIB 200 MG PO CAPS
200.0000 mg | ORAL_CAPSULE | Freq: Once | ORAL | Status: AC
  Administered 2022-07-11: 200 mg via ORAL
  Filled 2022-07-11: qty 1

## 2022-07-11 MED ORDER — LEVETIRACETAM IN NACL 1000 MG/100ML IV SOLN
1000.0000 mg | Freq: Once | INTRAVENOUS | Status: AC
  Administered 2022-07-11: 1000 mg via INTRAVENOUS
  Filled 2022-07-11: qty 100

## 2022-07-11 MED ORDER — FENTANYL CITRATE (PF) 250 MCG/5ML IJ SOLN
INTRAMUSCULAR | Status: DC | PRN
  Administered 2022-07-11 (×5): 50 ug via INTRAVENOUS
  Administered 2022-07-11: 100 ug via INTRAVENOUS

## 2022-07-11 MED ORDER — FENTANYL CITRATE (PF) 100 MCG/2ML IJ SOLN
25.0000 ug | INTRAMUSCULAR | Status: DC | PRN
  Administered 2022-07-11: 25 ug via INTRAVENOUS

## 2022-07-11 MED ORDER — ACETAMINOPHEN 325 MG PO TABS
650.0000 mg | ORAL_TABLET | ORAL | Status: DC | PRN
  Administered 2022-07-12 – 2022-07-13 (×4): 650 mg via ORAL
  Filled 2022-07-11 (×4): qty 2

## 2022-07-11 MED ORDER — 0.9 % SODIUM CHLORIDE (POUR BTL) OPTIME
TOPICAL | Status: DC | PRN
  Administered 2022-07-11 (×3): 1000 mL

## 2022-07-11 MED ORDER — MANNITOL 25 % IV SOLN
75.0000 g | Freq: Once | Status: DC
  Filled 2022-07-11: qty 300

## 2022-07-11 MED ORDER — SODIUM CHLORIDE 0.9 % IV SOLN: INTRAVENOUS | Status: DC | PRN

## 2022-07-11 MED ORDER — CHLORHEXIDINE GLUCONATE 0.12 % MT SOLN: 15.0000 mL | Freq: Once | OROMUCOSAL | Status: AC

## 2022-07-11 MED ORDER — BUPIVACAINE-EPINEPHRINE (PF) 0.5% -1:200000 IJ SOLN
INTRAMUSCULAR | Status: AC
  Filled 2022-07-11: qty 30

## 2022-07-11 MED ORDER — BUDESON-GLYCOPYRROL-FORMOTEROL 160-9-4.8 MCG/ACT IN AERO: 2.0000 | INHALATION_SPRAY | Freq: Two times a day (BID) | RESPIRATORY_TRACT | Status: DC

## 2022-07-11 MED ORDER — MOMETASONE FURO-FORMOTEROL FUM 200-5 MCG/ACT IN AERO
2.0000 | INHALATION_SPRAY | Freq: Two times a day (BID) | RESPIRATORY_TRACT | Status: DC
  Filled 2022-07-11: qty 8.8

## 2022-07-11 MED ORDER — THROMBIN 5000 UNITS EX SOLR
OROMUCOSAL | Status: DC | PRN
  Administered 2022-07-11: 5 mL via TOPICAL

## 2022-07-11 MED ORDER — PANTOPRAZOLE SODIUM 40 MG IV SOLR
40.0000 mg | Freq: Every day | INTRAVENOUS | Status: DC
  Administered 2022-07-11 – 2022-07-12 (×2): 40 mg via INTRAVENOUS
  Filled 2022-07-11 (×2): qty 10

## 2022-07-11 MED ORDER — CHLORHEXIDINE GLUCONATE 0.12 % MT SOLN
OROMUCOSAL | Status: AC
  Administered 2022-07-11: 15 mL via OROMUCOSAL
  Filled 2022-07-11: qty 15

## 2022-07-11 MED ORDER — ESMOLOL HCL 100 MG/10ML IV SOLN
INTRAVENOUS | Status: AC
  Filled 2022-07-11: qty 10

## 2022-07-11 MED ORDER — DEXAMETHASONE SODIUM PHOSPHATE 10 MG/ML IJ SOLN
INTRAMUSCULAR | Status: AC
  Filled 2022-07-11: qty 1

## 2022-07-11 MED ORDER — AMISULPRIDE (ANTIEMETIC) 5 MG/2ML IV SOLN: 10.0000 mg | Freq: Once | INTRAVENOUS | Status: DC | PRN

## 2022-07-11 MED ORDER — HYDROCHLOROTHIAZIDE 25 MG PO TABS: 25.0000 mg | ORAL_TABLET | Freq: Every day | ORAL | Status: DC | PRN

## 2022-07-11 MED ORDER — DEXAMETHASONE SODIUM PHOSPHATE 10 MG/ML IJ SOLN
INTRAMUSCULAR | Status: DC | PRN
  Administered 2022-07-11: 10 mg via INTRAVENOUS

## 2022-07-11 MED ORDER — ACETAMINOPHEN 650 MG RE SUPP: 650.0000 mg | RECTAL | Status: DC | PRN

## 2022-07-11 MED ORDER — THROMBIN 5000 UNITS EX SOLR
CUTANEOUS | Status: AC
  Filled 2022-07-11: qty 5000

## 2022-07-11 MED ORDER — LIDOCAINE-EPINEPHRINE 1 %-1:100000 IJ SOLN
INTRAMUSCULAR | Status: DC | PRN
  Administered 2022-07-11: 5 mL

## 2022-07-11 MED ORDER — MANNITOL 25 % IV SOLN
INTRAVENOUS | Status: DC | PRN
  Administered 2022-07-11 (×6): 12.5 g via INTRAVENOUS

## 2022-07-11 MED ORDER — SUCCINYLCHOLINE 20MG/ML (10ML) SYRINGE FOR MEDFUSION PUMP - OPTIME
INTRAMUSCULAR | Status: DC | PRN
  Administered 2022-07-11: 120 mg via INTRAVENOUS

## 2022-07-11 MED ORDER — ORAL CARE MOUTH RINSE: 15.0000 mL | OROMUCOSAL | Status: DC | PRN

## 2022-07-11 MED ORDER — PHENYLEPHRINE 80 MCG/ML (10ML) SYRINGE FOR IV PUSH (FOR BLOOD PRESSURE SUPPORT)
PREFILLED_SYRINGE | INTRAVENOUS | Status: DC | PRN
  Administered 2022-07-11: 80 ug via INTRAVENOUS

## 2022-07-11 MED ORDER — ALBUTEROL SULFATE HFA 108 (90 BASE) MCG/ACT IN AERS
INHALATION_SPRAY | RESPIRATORY_TRACT | Status: AC
  Filled 2022-07-11: qty 6.7

## 2022-07-11 MED ORDER — SUGAMMADEX SODIUM 200 MG/2ML IV SOLN
INTRAVENOUS | Status: DC | PRN
  Administered 2022-07-11: 200 mg via INTRAVENOUS

## 2022-07-11 MED ORDER — VANCOMYCIN HCL IN DEXTROSE 1-5 GM/200ML-% IV SOLN
1000.0000 mg | Freq: Once | INTRAVENOUS | Status: AC
  Administered 2022-07-11: 1000 mg via INTRAVENOUS
  Filled 2022-07-11: qty 200

## 2022-07-11 MED ORDER — FENTANYL CITRATE (PF) 100 MCG/2ML IJ SOLN
INTRAMUSCULAR | Status: AC
  Filled 2022-07-11: qty 2

## 2022-07-11 MED ORDER — LIDOCAINE 2% (20 MG/ML) 5 ML SYRINGE
INTRAMUSCULAR | Status: AC
  Filled 2022-07-11: qty 5

## 2022-07-11 MED ORDER — PROMETHAZINE HCL 25 MG PO TABS: 12.5000 mg | ORAL_TABLET | ORAL | Status: DC | PRN

## 2022-07-11 MED ORDER — LEVETIRACETAM IN NACL 500 MG/100ML IV SOLN
500.0000 mg | Freq: Two times a day (BID) | INTRAVENOUS | Status: DC
  Administered 2022-07-11 – 2022-07-13 (×4): 500 mg via INTRAVENOUS
  Filled 2022-07-11 (×4): qty 100

## 2022-07-11 MED ORDER — VANCOMYCIN HCL IN DEXTROSE 1-5 GM/200ML-% IV SOLN
INTRAVENOUS | Status: AC
  Filled 2022-07-11: qty 200

## 2022-07-11 MED ORDER — ONDANSETRON HCL 4 MG/2ML IJ SOLN: 4.0000 mg | INTRAMUSCULAR | Status: DC | PRN

## 2022-07-11 MED ORDER — MICROFIBRILLAR COLL HEMOSTAT EX PADS
MEDICATED_PAD | CUTANEOUS | Status: DC | PRN
  Administered 2022-07-11: 1 via TOPICAL

## 2022-07-11 MED ORDER — PHENYLEPHRINE HCL-NACL 20-0.9 MG/250ML-% IV SOLN
INTRAVENOUS | Status: DC | PRN
  Administered 2022-07-11: 25 ug/min via INTRAVENOUS

## 2022-07-11 MED ORDER — ROCURONIUM BROMIDE 10 MG/ML (PF) SYRINGE
PREFILLED_SYRINGE | INTRAVENOUS | Status: AC
  Filled 2022-07-11: qty 10

## 2022-07-11 MED ORDER — HEPARIN SODIUM (PORCINE) 5000 UNIT/ML IJ SOLN
5000.0000 [IU] | Freq: Two times a day (BID) | INTRAMUSCULAR | Status: DC
  Administered 2022-07-12 – 2022-07-13 (×2): 5000 [IU] via SUBCUTANEOUS
  Filled 2022-07-11 (×2): qty 1

## 2022-07-11 MED ORDER — THROMBIN 20000 UNITS EX SOLR
CUTANEOUS | Status: AC
  Filled 2022-07-11: qty 20000

## 2022-07-11 MED ORDER — CHLORHEXIDINE GLUCONATE CLOTH 2 % EX PADS
6.0000 | MEDICATED_PAD | Freq: Every day | CUTANEOUS | Status: DC
  Administered 2022-07-12: 6 via TOPICAL

## 2022-07-11 SURGICAL SUPPLY — 90 items
BAG COUNTER SPONGE SURGICOUNT (BAG) ×1 IMPLANT
BAND RUBBER #18 3X1/16 STRL (MISCELLANEOUS) ×2 IMPLANT
BIT DRILL WIRE PASS 1.3MM (BIT) IMPLANT
BLADE CLIPPER SURG (BLADE) ×1 IMPLANT
BUR CARBIDE MATCH 3.0 (BURR) ×1 IMPLANT
BUR SPIRAL ROUTER 2.3 (BUR) ×1 IMPLANT
CANISTER SUCT 3000ML PPV (MISCELLANEOUS) ×1 IMPLANT
CNTNR URN SCR LID CUP LEK RST (MISCELLANEOUS) IMPLANT
CONT SPEC 4OZ STRL OR WHT (MISCELLANEOUS) ×1
COVER BURR HOLE 14 (Orthopedic Implant) IMPLANT
COVERAGE SUPPORT O-ARM STEALTH (MISCELLANEOUS) ×1 IMPLANT
DRAIN JACKSON RD 7FR 3/32 (WOUND CARE) IMPLANT
DRAIN JP 10F RND RADIO (DRAIN) IMPLANT
DRAPE LAPAROTOMY 100X72X124 (DRAPES) IMPLANT
DRAPE MICROSCOPE SLANT 54X150 (MISCELLANEOUS) ×1 IMPLANT
DRAPE NEUROLOGICAL W/INCISE (DRAPES) ×1 IMPLANT
DRAPE SHEET LG 3/4 BI-LAMINATE (DRAPES) ×1 IMPLANT
DRAPE SURG 17X23 STRL (DRAPES) IMPLANT
DRAPE WARM FLUID 44X44 (DRAPES) ×1 IMPLANT
DRILL WIRE PASS 1.3MM (BIT)
DRSG TELFA 3X8 NADH STRL (GAUZE/BANDAGES/DRESSINGS) IMPLANT
DURAPREP 26ML APPLICATOR (WOUND CARE) IMPLANT
DURAPREP 6ML APPLICATOR 50/CS (WOUND CARE) ×1 IMPLANT
ELECT COATED BLADE 2.86 ST (ELECTRODE) ×1 IMPLANT
ELECT REM PT RETURN 9FT ADLT (ELECTROSURGICAL) ×1
ELECTRODE REM PT RTRN 9FT ADLT (ELECTROSURGICAL) ×1 IMPLANT
EVACUATOR SILICONE 100CC (DRAIN) IMPLANT
FEE COVERAGE SUPPORT O-ARM (MISCELLANEOUS) IMPLANT
FORCEPS BIPO MALIS IRRIG 9X1.5 (NEUROSURGERY SUPPLIES) ×1 IMPLANT
FORCEPS BIPOLAR SPETZLER 8 1.0 (NEUROSURGERY SUPPLIES) IMPLANT
GAUZE 4X4 16PLY ~~LOC~~+RFID DBL (SPONGE) IMPLANT
GAUZE SPONGE 4X4 12PLY STRL (GAUZE/BANDAGES/DRESSINGS) IMPLANT
GLOVE BIOGEL PI IND STRL 7.5 (GLOVE) IMPLANT
GLOVE BIOGEL PI IND STRL 8 (GLOVE) ×2 IMPLANT
GLOVE ECLIPSE 7.0 STRL STRAW (GLOVE) IMPLANT
GLOVE ECLIPSE 8.0 STRL XLNG CF (GLOVE) ×2 IMPLANT
GLOVE EXAM NITRILE XL STR (GLOVE) IMPLANT
GLOVE SURG ENC MOIS LTX SZ8 (GLOVE) ×2 IMPLANT
GLOVE SURG UNDER POLY LF SZ8.5 (GLOVE) ×2 IMPLANT
GOWN STRL REUS W/ TWL LRG LVL3 (GOWN DISPOSABLE) IMPLANT
GOWN STRL REUS W/ TWL XL LVL3 (GOWN DISPOSABLE) ×3 IMPLANT
GOWN STRL REUS W/TWL 2XL LVL3 (GOWN DISPOSABLE) IMPLANT
GOWN STRL REUS W/TWL LRG LVL3 (GOWN DISPOSABLE) ×1
GOWN STRL REUS W/TWL XL LVL3 (GOWN DISPOSABLE) ×3
GRAFT DURAGEN MATRIX 3WX3L (Graft) ×1 IMPLANT
GRAFT DURAGEN MATRIX 3X3 SNGL (Graft) IMPLANT
HEMOSTAT POWDER KIT SURGIFOAM (HEMOSTASIS) ×1 IMPLANT
HEMOSTAT SNOW SURGICEL 2X4 (HEMOSTASIS) IMPLANT
HEMOSTAT SURGICEL 2X14 (HEMOSTASIS) ×1 IMPLANT
HEMOSTAT SURGICEL 2X4 FIBR (HEMOSTASIS) IMPLANT
IV NS 1000ML (IV SOLUTION) ×1
IV NS 1000ML BAXH (IV SOLUTION) ×1 IMPLANT
KIT BASIN OR (CUSTOM PROCEDURE TRAY) ×1 IMPLANT
KIT TURNOVER KIT B (KITS) ×1 IMPLANT
MARKER SKIN DUAL TIP RULER LAB (MISCELLANEOUS) IMPLANT
MARKER SPHERE PSV REFLC NDI (MISCELLANEOUS) ×3 IMPLANT
NEEDLE HYPO 22GX1.5 SAFETY (NEEDLE) ×1 IMPLANT
NS IRRIG 1000ML POUR BTL (IV SOLUTION) ×3 IMPLANT
PACK CRANIOTOMY CUSTOM (CUSTOM PROCEDURE TRAY) ×1 IMPLANT
PATTIES SURGICAL .5 X.5 (GAUZE/BANDAGES/DRESSINGS) IMPLANT
PATTIES SURGICAL .5 X3 (DISPOSABLE) IMPLANT
PATTIES SURGICAL 1X1 (DISPOSABLE) IMPLANT
PERFORATOR LRG  14-11MM (BIT) ×1
PERFORATOR LRG 14-11MM (BIT) ×1 IMPLANT
PIN MAYFIELD SKULL DISP (PIN) ×1 IMPLANT
RETRACTOR LONE STAR DISPOSABLE (INSTRUMENTS) ×2 IMPLANT
SCREW UNIII AXS SD 1.5X4 (Screw) IMPLANT
SET CARTRIDGE AND TUBING (SET/KITS/TRAYS/PACK) IMPLANT
SET TUBING IRRIGATION DISP (TUBING) ×1 IMPLANT
SPONGE NEURO XRAY DETECT 1X3 (DISPOSABLE) IMPLANT
SPONGE SURGIFOAM ABS GEL 100 (HEMOSTASIS) ×1 IMPLANT
STAPLER VISISTAT 35W (STAPLE) ×1 IMPLANT
STOCKINETTE 6  STRL (DRAPES)
STOCKINETTE 6 STRL (DRAPES) ×1 IMPLANT
STRIP CLOSURE SKIN 1/2X4 (GAUZE/BANDAGES/DRESSINGS) ×1 IMPLANT
SUT ETHILON 3 0 FSL (SUTURE) IMPLANT
SUT ETHILON 3 0 PS 1 (SUTURE) IMPLANT
SUT NURALON 4 0 TR CR/8 (SUTURE) ×3 IMPLANT
SUT VIC AB 0 CT1 18XCR BRD8 (SUTURE) ×1 IMPLANT
SUT VIC AB 0 CT1 8-18 (SUTURE) ×2
SUT VIC AB 2-0 CP2 18 (SUTURE) ×1 IMPLANT
SUT VICRYL RAPIDE 4/0 PS 2 (SUTURE) ×1 IMPLANT
TIP SHEAR CVD EXTENDED 36KH (INSTRUMENTS) IMPLANT
TOWEL GREEN STERILE (TOWEL DISPOSABLE) ×1 IMPLANT
TOWEL GREEN STERILE FF (TOWEL DISPOSABLE) ×1 IMPLANT
TRAY FOLEY MTR SLVR 16FR STAT (SET/KITS/TRAYS/PACK) ×1 IMPLANT
TUBE CONNECTING 12X1/4 (SUCTIONS) ×1 IMPLANT
UNDERPAD 30X36 HEAVY ABSORB (UNDERPADS AND DIAPERS) ×1 IMPLANT
WATER STERILE IRR 1000ML POUR (IV SOLUTION) ×1 IMPLANT
WRENCH TORQUE 36KHZ (INSTRUMENTS) IMPLANT

## 2022-07-11 NOTE — Anesthesia Postprocedure Evaluation (Signed)
Anesthesia Post Note  Patient: Laurie Mccoy  Procedure(s) Performed: Craniotomy for resection of tumor APPLICATION OF CRANIAL NAVIGATION     Patient location during evaluation: PACU Anesthesia Type: General Level of consciousness: sedated Pain management: pain level controlled Vital Signs Assessment: post-procedure vital signs reviewed and stable Respiratory status: spontaneous breathing and respiratory function stable Cardiovascular status: stable Postop Assessment: no apparent nausea or vomiting Anesthetic complications: no   No notable events documented.  Last Vitals:  Vitals:   07/11/22 1625 07/11/22 1630  BP: (!) 119/45   Pulse: 61 60  Resp: 14 13  Temp:    SpO2: 97% 97%    Last Pain:  Vitals:   07/11/22 0855  TempSrc:   PainSc: 0-No pain                 Arav Bannister DANIEL

## 2022-07-11 NOTE — Progress Notes (Signed)
Pt is s/p craniotomy. Only one vanc dose post op needed per Dr. Kathyrn Sheriff.  Vanc 1g IV x1   Onnie Boer, PharmD, [place], AAHIVP, CPP Infectious Disease Pharmacist 07/11/2022 5:58 PM

## 2022-07-11 NOTE — Anesthesia Procedure Notes (Addendum)
Procedure Name: Intubation Date/Time: 07/11/2022 10:54 AM  Performed by: Rande Brunt, CRNAPre-anesthesia Checklist: Patient identified, Emergency Drugs available, Suction available and Patient being monitored Patient Re-evaluated:Patient Re-evaluated prior to induction Oxygen Delivery Method: Circle System Utilized Preoxygenation: Pre-oxygenation with 100% oxygen Induction Type: IV induction Ventilation: Mask ventilation without difficulty Laryngoscope Size: Miller and 2 Grade View: Grade I Tube type: Oral Tube size: 7.0 mm Number of attempts: 1 Airway Equipment and Method: Stylet Placement Confirmation: ETT inserted through vocal cords under direct vision, positive ETCO2 and breath sounds checked- equal and bilateral Secured at: 21 cm Tube secured with: Tape Dental Injury: Teeth and Oropharynx as per pre-operative assessment and Injury to lip  Comments: Small cut to upper lip, guaze with neo applied

## 2022-07-11 NOTE — Transfer of Care (Signed)
Immediate Anesthesia Transfer of Care Note  Patient: Laurie Mccoy  Procedure(s) Performed: Craniotomy for resection of tumor APPLICATION OF CRANIAL NAVIGATION  Patient Location: PACU  Anesthesia Type:General  Level of Consciousness: awake, alert  and oriented  Airway & Oxygen Therapy: Patient Spontanous Breathing and Patient connected to face mask oxygen  Post-op Assessment: Report given to RN, Post -op Vital signs reviewed and stable and Patient moving all extremities X 4  Post vital signs: Reviewed and stable  Last Vitals:  Vitals Value Taken Time  BP 159/90 07/11/22 1400  Temp    Pulse 81 07/11/22 1404  Resp 11 07/11/22 1404  SpO2 89 % 07/11/22 1404  Vitals shown include unvalidated device data.  Last Pain:  Vitals:   07/11/22 0855  TempSrc:   PainSc: 0-No pain         Complications: No notable events documented.

## 2022-07-11 NOTE — Op Note (Signed)
Providing Compassionate, Quality Care - Together  Date of service: 07/11/2022  PREOP DIAGNOSIS:  Metastatic adenocarcinoma, lung, to the brain Right occipital metastatic lesion  POSTOP DIAGNOSIS: Same  PROCEDURE: Right occipital stereotactic craniotomy for resection of tumor Intraoperative use of stereotaxy, Stealth Intraoperative use of microscope, for microdissection  SURGEON: Dr. Pieter Partridge C. Aneyah Lortz, DO  ASSISTANT: Dr. Consuella Lose, MD  ANESTHESIA: General Endotracheal  EBL: 100 cc  SPECIMENS: Right occipital tumor  DRAINS: None  COMPLICATIONS: None  CONDITION: Hemodynamically stable  HISTORY: Laurie Mccoy is a 81 y.o. female with newly diagnosed metastatic adenocarcinoma of the lung with multiple lesions to the brain, right temporal, right occipital, left occipital.  Her only complaints were primarily generalized fatigue, she denies any numbness tingling weakness or vision changes.  The right occipital lesion was measured to be 3.8 cm with surrounding vasogenic edema.  Due to this, we recommended treatment in the form of preoperative SRS to all 3 lesions, followed by right occipital craniotomy for resection of the larger tumor.  We discussed all risks, benefits and expected outcomes.  Informed consent was obtained.  I answered all of her questions.  We did discuss other alternative treatments however she and her family wanted maximal treatment at this time.  PROCEDURE IN DETAIL: The patient was brought to the operating room. After induction of general anesthesia, the patient was placed in a head holder, and positioned on the operative table in the prone position. All pressure points were meticulously padded.  Stereotactic registration was performed with the Medtronic Stealth machine, and verified with anatomical landmarks to be excellently accurate.  The occipital region was clipped free of hair.  Skin incision was then marked out and prepped and draped in the usual  sterile fashion. Physician driven timeout was performed.  Using 10 blade, sharp incision was made down to the periosteum.  Raney clips were applied bilaterally, using Bovie electrocautery and periosteal elevator, periosteum was cleared bilaterally and retractors were placed.  Using navigation, again the craniotomy was planned.  Using high-speed drill, a craniotomy was performed and elevated in normal fashion.  Epidural hemostasis was achieved with Surgifoam and bipolar cautery.  The microscope was sterilely draped and brought into the field for the remainder the procedure.  A curvilinear durotomy was performed with 15 blade and Metzenbaum scissors, and reflected medially.  4-0 Nurolon's were placed for retraction of the dura.  There was obvious cortical involvement of the metastatic lesion that was identified.  Using bipolar electrocautery, corticectomy was performed over the lateral aspect of the tumor and the capsule was identified.  Using bipolar electrocautery, I circumferentially disconnected the tissue and capsule from the white matter and cortex.  There was some small adherence of the tumor to the medial portion of the dura which was coagulated cut and disconnected.  The tumor was kept and sent for permanent pathology.  Once completely circumferentially disconnected I then followed the resection cavity for other evidence of capsule.  These were cleared using Penfield 1 and suction technique.  Again I then explored the resection cavity and achieved hemostasis with bipolar cautery.  There appeared to be no residual capsule remaining medially, laterally, superiorly, inferiorly or at the depth of the resection cavity.  Hemostasis was then again achieved with Surgifoam and cottonoids.  I then proceeded to place the bone flap with cranial plating system.  The resection cavity was copiously irrigated and monitored for series of minutes and noted to be excellently hemostatic.  This was then lined with  Surgicel.   The dura was then closed with 4-0 Nurolon sutures in interrupted fashion.  Epidural hemostasis was achieved with Surgifoam.  DuraGen was then placed under the craniotomy site.  The bone flap was then affixed in its original position with the cranial plating system.  Retractor was then taken out of the wound.  Raney clips were removed, the galea was closed with 2-0 Vicryl sutures.  Skin was closed with staples.  Sterile dressing was applied.  At the end of the case all sponge, needle, and instrument counts were correct. The patient was then transferred to the stretcher, the Mayfield head holder was removed, extubated, and taken to the post-anesthesia care unit in stable hemodynamic condition.

## 2022-07-11 NOTE — Anesthesia Procedure Notes (Signed)
Arterial Line Insertion Start/End10/02/2022 9:30 AM, 07/11/2022 9:35 AM Performed by: Rande Brunt, CRNA  Patient location: Pre-op. Preanesthetic checklist: patient identified, IV checked, site marked, risks and benefits discussed, surgical consent, monitors and equipment checked, pre-op evaluation, timeout performed and anesthesia consent Lidocaine 1% used for infiltration Left, radial was placed Catheter size: 20 G Hand hygiene performed  and maximum sterile barriers used   Attempts: 1 Procedure performed without using ultrasound guided technique. Following insertion, dressing applied and Biopatch. Post procedure assessment: normal and unchanged

## 2022-07-11 NOTE — H&P (Signed)
Providing Compassionate, Quality Care - Together  NEUROSURGERY HISTORY & PHYSICAL   Laurie Mccoy is an 81 y.o. female.   Chief Complaint: Right metastatic occipital tumor HPI: This is a right-handed 81 year old female, with newly diagnosed metastatic adenocarcinoma of the lung with 3 metastases to the brain.  She underwent stereotactic radiosurgery yesterday for all 3 lesions, however her right occipital lesion is 3.7 cm and therefore presents today for surgical resection.  She was found to have her cancer via scans from her lungs, denied any headaches, nausea, vomiting, weakness, numbness, tingling, vision changes.  She does complain of generalized fatigue otherwise has no complaints.  Past Medical History:  Diagnosis Date   Anxiety    "had panic attacks years ago"   Asthma    COPD (chronic obstructive pulmonary disease) ([place])    Goiter 2022   History of hiatal hernia    "dx in college, never bothered me"   History of kidney stones 2012   Hypertension    Lung cancer ([place])    Sleep apnea     Past Surgical History:  Procedure Laterality Date   APPENDECTOMY     BRONCHIAL BIOPSY  06/12/2021   Procedure: BRONCHIAL BIOPSIES;  Surgeon: Garner Nash, DO;  Location: [facility];  Service: Pulmonary;;   BRONCHIAL BRUSHINGS  06/12/2021   Procedure: BRONCHIAL BRUSHINGS;  Surgeon: Garner Nash, DO;  Location: [facility];  Service: Pulmonary;;   BRONCHIAL WASHINGS  06/12/2021   Procedure: BRONCHIAL WASHINGS;  Surgeon: Garner Nash, DO;  Location: [facility];  Service: Pulmonary;;   CHOLECYSTECTOMY     COLON RESECTION     FIDUCIAL MARKER PLACEMENT  06/12/2021   Procedure: FIDUCIAL MARKER PLACEMENT;  Surgeon: Garner Nash, DO;  Location: [facility];  Service: Pulmonary;;   hernia     x 2   TONSILLECTOMY     VIDEO BRONCHOSCOPY WITH ENDOBRONCHIAL NAVIGATION Bilateral 06/12/2021   Procedure: VIDEO BRONCHOSCOPY WITH ENDOBRONCHIAL NAVIGATION;  Surgeon: Garner Nash, DO;  Location: [facility];  Service: Pulmonary;  Laterality: Bilateral;  ION   VIDEO BRONCHOSCOPY WITH RADIAL ENDOBRONCHIAL ULTRASOUND  06/12/2021   Procedure: RADIAL ENDOBRONCHIAL ULTRASOUND;  Surgeon: Garner Nash, DO;  Location: [facility];  Service: Pulmonary;;    Family History  Problem Relation Age of Onset   Lung cancer Mother        never smoker   Brain cancer Mother    Lung cancer Father        smoked   Stroke Brother    Social History:  reports that she quit smoking about 4 weeks ago. Her smoking use included cigarettes. She has a 23.00 pack-year smoking history. She has been exposed to tobacco smoke. She has never used smokeless tobacco. She reports that she does not currently use alcohol. She reports that she does not use drugs.  Allergies:  Allergies  Allergen Reactions   Amoxicillin     itching   Compazine [Prochlorperazine]     Lose muscle control in face    Norvasc [Amlodipine] Swelling    Lower extremity swelling     Trelegy Ellipta [Fluticasone-Umeclidin-Vilant] Swelling    Swelling in legs and headache.    Medications Prior to Admission  Medication Sig Dispense Refill   acetaminophen (TYLENOL) 500 MG tablet Take 500 mg by mouth every 6 (six) hours as needed for moderate pain.     albuterol (PROVENTIL) (2.5 MG/3ML) 0.083% nebulizer solution Take 3 mLs (2.5 mg total) by nebulization every 4 (  four) hours as needed for wheezing or shortness of breath.     albuterol (VENTOLIN HFA) 108 (90 Base) MCG/ACT inhaler Inhale 2 puffs into the lungs every 6 (six) hours as needed for wheezing or shortness of breath. 18 g 1   ALPRAZolam (XANAX) 0.25 MG tablet Take 0.25 mg by mouth at bedtime as needed for anxiety.     Budeson-Glycopyrrol-Formoterol (BREZTRI AEROSPHERE) 160-9-4.8 MCG/ACT AERO INHALE 2 PUFFS BY MOUTH EVERY MORNING AND EVERY NIGHT AT BEDTIME 10.7 g 5   dexamethasone (DECADRON) 4 MG tablet Take 1 tablet (4 mg total) by mouth 2 (two) times daily  with a meal. Start the day of radiosurgery 4 tablet 0   Emollient (UDDERLY SMOOTH EXTRA CARE 20 EX) Apply 1 Application topically as needed (dry skin).     loratadine (CLARITIN) 10 MG tablet Take 10 mg by mouth at bedtime.     LORazepam (ATIVAN) 1 MG tablet Take 1 tablet (1 mg total) by mouth as needed. 30 min Prior to MRI or brain radiation procedure 5 tablet 0   telmisartan (MICARDIS) 20 MG tablet Take 1 tablet (20 mg total) by mouth at bedtime. 90 tablet 1   hydrochlorothiazide (HYDRODIURIL) 25 MG tablet Take 25 mg by mouth daily as needed (Leg swelling).     metFORMIN (GLUCOPHAGE-XR) 500 MG 24 hr tablet Take 500 mg by mouth every morning. (Patient not taking: Reported on 06/26/2022)     ondansetron (ZOFRAN) 8 MG tablet Take 1 tablet (8 mg total) by mouth every 8 (eight) hours as needed for nausea or vomiting. 30 tablet 2    Results for orders placed or performed during the hospital encounter of 07/11/22 (from the past 48 hour(s))  Type and screen Alex     Status: None   Collection Time: 07/11/22  8:55 AM  Result Value Ref Range   ABO/RH(D) O NEG    Antibody Screen POS    Sample Expiration      07/14/2022,2359 Performed at [facility], [place] [address], [place], [place] [address]    No results found.  ROS  All pertinent positives and negatives are listed HPI above  Blood pressure (!) 142/54, pulse 76, temperature 97.8 F (36.6 C), temperature source Oral, resp. rate 18, height 5' 6.5" (1.689 m), weight 88.5 kg, SpO2 95 %. Physical Exam  Awake alert oriented x3, no acute distress, PERRLA, speech fluent and appropriate Cranial nerves II through XII intact Face symmetric Unlabored breathing Bilateral upper/lower extremity full strength and symmetric Sensory intact light touch throughout No drift Visual fields full  Assessment/Plan 81 year old female with  Metastatic adenocarcinoma lung to the right occipital lobe  -OR today for right occipital  craniotomy, resection of tumor.  We discussed all risks, benefits and expected outcomes as well as alternatives to treatment.  Informed consent was obtained.  Answered all of her questions as well as her family questions.   Thank you for allowing me to participate in this patient's care.  Please do not hesitate to call with questions or concerns.   Elwin Sleight, [facility] Cell: [phone]

## 2022-07-12 ENCOUNTER — Telehealth: Payer: Self-pay | Admitting: Medical Oncology

## 2022-07-12 ENCOUNTER — Telehealth: Payer: Self-pay | Admitting: Oncology

## 2022-07-12 ENCOUNTER — Ambulatory Visit: Payer: Medicare Other

## 2022-07-12 ENCOUNTER — Encounter (HOSPITAL_COMMUNITY): Payer: Self-pay | Admitting: Neurological Surgery

## 2022-07-12 LAB — BASIC METABOLIC PANEL
Anion gap: 6 (ref 5–15)
BUN: 11 mg/dL (ref 8–23)
CO2: 24 mmol/L (ref 22–32)
Calcium: 8.1 mg/dL — ABNORMAL LOW (ref 8.9–10.3)
Chloride: 108 mmol/L (ref 98–111)
Creatinine, Ser: 0.53 mg/dL (ref 0.44–1.00)
GFR, Estimated: >60 mL/min (ref >60)
Glucose, Bld: 113 mg/dL — ABNORMAL HIGH (ref 70–99)
Potassium: 4.2 mmol/L (ref 3.5–5.1)
Sodium: 138 mmol/L (ref 135–145)

## 2022-07-12 LAB — CBC
HCT: 35.7 % — ABNORMAL LOW (ref 36.0–46.0)
Hemoglobin: 11.6 g/dL — ABNORMAL LOW (ref 12.0–15.0)
MCH: 31.8 pg (ref 26.0–34.0)
MCHC: 32.5 g/dL (ref 30.0–36.0)
MCV: 97.8 fL (ref 80.0–100.0)
Platelets: 221 10*3/uL (ref 150–400)
RBC: 3.65 MIL/uL — ABNORMAL LOW (ref 3.87–5.11)
RDW: 12.5 % (ref 11.5–15.5)
WBC: 9.3 10*3/uL (ref 4.0–10.5)
nRBC: 0 % (ref 0.0–0.2)

## 2022-07-12 LAB — SURGICAL PATHOLOGY

## 2022-07-12 MED ORDER — TRAMADOL HCL 50 MG PO TABS: 100.0000 mg | ORAL_TABLET | Freq: Four times a day (QID) | ORAL | Status: DC | PRN

## 2022-07-12 MED ORDER — CALCIUM CARBONATE ANTACID 500 MG PO CHEW
1.0000 | CHEWABLE_TABLET | Freq: Every day | ORAL | Status: DC | PRN
  Administered 2022-07-12: 200 mg via ORAL
  Filled 2022-07-12: qty 1

## 2022-07-12 MED ORDER — DEXAMETHASONE SODIUM PHOSPHATE 4 MG/ML IJ SOLN
4.0000 mg | Freq: Four times a day (QID) | INTRAMUSCULAR | Status: DC
  Administered 2022-07-12 – 2022-07-13 (×4): 4 mg via INTRAVENOUS
  Filled 2022-07-12 (×4): qty 1

## 2022-07-12 NOTE — Telephone Encounter (Signed)
Called Laurie Mccoy and advised her that we will check with Dr. Sondra Come on Monday to see if her radiation needs to be rescheduled since her chemotherapy will be delayed.

## 2022-07-12 NOTE — Progress Notes (Signed)
Patient refused use of CPAP for the evening, states she will try in the morning.

## 2022-07-12 NOTE — Evaluation (Signed)
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  She had steriotactic radiosurgery 10/4 and underwent  surgical resection of R occipital lesion 07/11/22.  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  Clinical Impression  Patient presents with decreased mobility due to deficits listed in PT problem list.  Currently min to minguard A for hallway ambulation with RW.  She was functioning independent driving and performing all IADL's including grocery shopping until 3 days ago.  She feels deconditioned and will benefit from continued skilled PT in the acute setting and from follow up [place] at d/c.         Recommendations for follow up therapy are one component of a multi-disciplinary discharge planning process, led by the attending physician.  Recommendations may be updated based on patient status, additional functional criteria and insurance authorization.  Follow Up Recommendations Home health PT      Assistance Recommended at Discharge Intermittent Supervision/Assistance  Patient can return home with the following  A little help with walking and/or transfers;Assistance with cooking/housework;Help with stairs or ramp for entrance;Assist for transportation;A little help with bathing/dressing/bathroom    Equipment Recommendations Rolling walker (2 wheels)  Recommendations for Other Services       Functional Status Assessment Patient has had a recent decline in their functional status and demonstrates the ability to make significant improvements in function in a reasonable and predictable amount of time.     Precautions / Restrictions Precautions Precautions: Fall      Mobility  Bed Mobility Overal bed mobility: Needs Assistance Bed Mobility:  Supine to Sit     Supine to sit: HOB elevated, Min guard     General bed mobility comments: assist for lines, lifting trunk    Transfers Overall transfer level: Needs assistance Equipment used: Rolling walker (2 wheels) Transfers: Sit to/from Stand Sit to Stand: Min guard           General transfer comment: cues to push from EOB, assist for balance    Ambulation/Gait Ambulation/Gait assistance: Min guard Gait Distance (Feet): 80 Feet Assistive device: Rolling walker (2 wheels) Gait Pattern/deviations: Step-through pattern, Decreased stride length, Shuffle       General Gait Details: mild shuffle, assist for balance  Stairs            Wheelchair Mobility    Modified Rankin (Stroke Patients Only)       Balance Overall balance assessment: Needs assistance Sitting-balance support: Feet supported Sitting balance-Leahy Scale: Good     Standing balance support: No upper extremity supported Standing balance-Leahy Scale: Fair Standing balance comment: static balance does not need UE support, but for dynamic tasks using RW                             Pertinent Vitals/Pain Pain Assessment Pain Assessment: 0-10 Pain Score: 2  Pain Location: R side of head Pain Descriptors / Indicators: Discomfort, Operative site guarding Pain Intervention(s): Monitored during session    Home Living Family/patient expects to be discharged to:: Private residence Living Arrangements: Spouse/significant other Available Help at Discharge: Family Type of Home: House Home Access: Ramped entrance       Home Layout: One level Home Equipment: Cane - single point;Shower seat - built  in      Prior Function Prior Level of Function : Independent/Modified Independent             Mobility Comments: was grocery shopping, cooking, cleaning till 3 days ago       Hand Dominance        Extremity/Trunk Assessment   Upper Extremity Assessment Upper Extremity  Assessment: Defer to OT evaluation    Lower Extremity Assessment Lower Extremity Assessment: Generalized weakness    Cervical / Trunk Assessment Cervical / Trunk Assessment: Kyphotic  Communication   Communication: No difficulties  Cognition Arousal/Alertness: Awake/alert Behavior During Therapy: WFL for tasks assessed/performed Overall Cognitive Status: Within Functional Limits for tasks assessed                                          General Comments General comments (skin integrity, edema, etc.): VSS on RA (concerned about BP but MAP 64 sitting and 72 after ambulation    Exercises     Assessment/Plan    PT Assessment Patient needs continued PT services  PT Problem List Decreased strength;Decreased mobility;Decreased balance;Decreased knowledge of use of DME;Decreased activity tolerance       PT Treatment Interventions Gait training;Functional mobility training;Balance training;Therapeutic exercise;Therapeutic activities;DME instruction    PT Goals (Current goals can be found in the Care Plan section)  Acute Rehab PT Goals Patient Stated Goal: to return to independent PT Goal Formulation: With patient Time For Goal Achievement: 07/26/22 Potential to Achieve Goals: Good    Frequency       Co-evaluation               AM-PAC PT "6 Clicks" Mobility  Outcome Measure Help needed turning from your back to your side while in a flat bed without using bedrails?: A Little Help needed moving from lying on your back to sitting on the side of a flat bed without using bedrails?: A Little Help needed moving to and from a bed to a chair (including a wheelchair)?: A Little Help needed standing up from a chair using your arms (e.g., wheelchair or bedside chair)?: A Little Help needed to walk in hospital room?: A Little Help needed climbing 3-5 steps with a railing? : Total 6 Click Score: 16    End of Session Equipment Utilized During Treatment: Gait  belt Activity Tolerance: Patient tolerated treatment well Patient left: with call bell/phone within reach;in chair   PT Visit Diagnosis: Other abnormalities of gait and mobility (R26.89);Muscle weakness (generalized) (M62.81)    Time: 6286-3817 PT Time Calculation (min) (ACUTE ONLY): 32 min   Charges:   PT Evaluation $PT Eval Low Complexity: 1 Low PT Treatments $Gait Training: 8-22 mins        Magda Kiel, PT Acute Rehabilitation Services Office:[phone] 07/12/2022   Reginia Naas 07/12/2022, 11:01 AM

## 2022-07-12 NOTE — TOC Initial Note (Signed)
Transition of Care [facility]) - Initial/Assessment Note    Patient Details  Name: Laurie Mccoy MRN: [id number] Date of Birth: [birth year]  [facility]) CM/SW Contact:    Pollie Friar, RN Phone Number: 07/12/2022, 1:05 PM  Clinical Narrative:                 Pt is from home with her spouse. Pt states he can be with her all the time as needed.  No DME at home. Pt will need walker for home delivered to the room prior to d/c.  Recommendations for home health services. Pt has no preference for [facility] agency. HH arranged with [facility]. Information on the AVS.  MD pt will need [place] orders and face to face entered prior to d/c. Pt has transportation home at d/c.  TOC following for further d/c needs.   Expected Discharge Plan: [place] Barriers to Discharge: Continued Medical Work up   Patient Goals and CMS Choice   CMS Medicare.gov Compare Post Acute Care list provided to:: Patient Choice offered to / list presented to : Patient  Expected Discharge Plan and Services Expected Discharge Plan: [place]   Discharge Planning Services: CM Consult Post Acute Care Choice: [place] arrangements for the past 2 months: Single Family Home                 DME Arranged: Walker rolling DME Agency: [facility]       HH Arranged: OT, PT HH Agency: [facility] Date [place]: 07/12/22   Representative spoke with at [place]: Claiborne Billings  Prior Living Arrangements/Services Living arrangements for the past 2 months: [place] Lives with:: Spouse Patient language and need for interpreter reviewed:: Yes Do you feel safe going back to the place where you live?: Yes      Need for Family Participation in Patient Care: Yes (Comment) Care giver support system in place?: Yes (comment)   Criminal Activity/Legal Involvement Pertinent to Current Situation/Hospitalization: No - Comment as needed  Activities of Daily  Living   ADL Screening (condition at time of admission) Patient's cognitive ability adequate to safely complete daily activities?: Yes Is the patient deaf or have difficulty hearing?: No Does the patient have difficulty seeing, even when wearing glasses/contacts?: No Does the patient have difficulty concentrating, remembering, or making decisions?: No Patient able to express need for assistance with ADLs?: Yes Does the patient have difficulty dressing or bathing?: No Independently performs ADLs?: Yes (appropriate for developmental age) Does the patient have difficulty walking or climbing stairs?: No Weakness of Legs: None Weakness of Arms/Hands: None  Permission Sought/Granted                  Emotional Assessment Appearance:: Appears stated age Attitude/Demeanor/Rapport: Engaged Affect (typically observed): Accepting Orientation: : Oriented to Self, Oriented to Place, Oriented to  Time, Oriented to Situation   Psych Involvement: No (comment)  Admission diagnosis:  S/P craniotomy [Z98.890] 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 [name] [address] Phone: [phone] Fax: [phone]     Social Determinants of Health (SDOH) Interventions    Readmission Risk Interventions     No data to display

## 2022-07-12 NOTE — Progress Notes (Signed)
Subjective: Patient reports doing well overall. She has a moderate right-sided headache. Periods of hypotension with episodes of dizziness overnight.   Objective: Vital signs in last 24 hours: Temp:  [97.2 F (36.2 C)-97.9 F (36.6 C)] 97.3 F (36.3 C) (10/06 0800) Pulse Rate:  [32-83] 57 (10/06 0700) Resp:  [10-22] 18 (10/06 0700) BP: (88-159)/(34-90) 110/42 (10/06 0700) SpO2:  [92 %-98 %] 96 % (10/06 0700) Arterial Line BP: (121-163)/(47-62) 123/61 (10/05 2200) Weight:  [88.5 kg] 88.5 kg (10/05 0826)  Intake/Output from previous day: 10/05 0701 - 10/06 0700 In: 3129.4 [I.V.:2502.9; IV Piggyback:626.4] Out: 2400 [Urine:2300; Blood:100] Intake/Output this shift: No intake/output data recorded.  Physical Exam: Patient is awake, A/O X 4, conversant, and in good spirits. Eyes open spontaneously. They are in NAD and VSS. Doing well. Speech is fluent and appropriate. MAEW with good strength that is symmetric bilaterally.  BUE 5/5 throughout, BLE 5/5 throughout. No pronator drift. Sensation to light touch is intact. PERLA, EOMI. CNs grossly intact. Dressing is clean dry intact. Incision is well approximated with no drainage, erythema, or edema.   Lab Results: Recent Labs    07/11/22 1707 07/12/22 0324  WBC 8.9 9.3  HGB 12.0 11.6*  HCT 36.2 35.7*  PLT 210 221   BMET Recent Labs    07/11/22 1218 07/11/22 1707 07/12/22 0324  NA 133*  --  138  K 3.5  --  4.2  CL  --   --  108  CO2  --   --  24  GLUCOSE  --   --  113*  BUN  --   --  11  CREATININE  --  0.60 0.53  CALCIUM  --   --  8.1*    Studies/Results: No results found.  Assessment/Plan: 81 y.o. female who is POD #1 s/p right occipital craniotomy for resection of tumor. She is recovering well. She is having mild to moderate right-sided headaches. Neuro exam is at baseline. She has had episodes of hypotension with associated dizziness. IVF have been increased. Will plan to keep her in the ICU today for continued  monitoring. She has worked with PT who is recommending HHPT. She is awaiting OT evaluation. Continue working on pain control, mobility and ambulating patient.    LOS: 1 day     Marvis Moeller, DNP, AGNP-C Neurosurgery Nurse Practitioner  [facility] [place] [address], [address], [place], [place] [address] P: [phone]    F: [phone]  07/12/2022 8:21 AM

## 2022-07-12 NOTE — Telephone Encounter (Signed)
Pt notified of med onc plan for 10/16.She voiced understanding,  Message send to Dr Sondra Come to f/u with pt regarding her xrt appts starting Monday , 10/9.

## 2022-07-13 ENCOUNTER — Inpatient Hospital Stay (HOSPITAL_COMMUNITY): Payer: Medicare Other

## 2022-07-13 MED ORDER — GADOBUTROL 1 MMOL/ML IV SOLN
8.0000 mL | Freq: Once | INTRAVENOUS | Status: AC | PRN
  Administered 2022-07-13: 8 mL via INTRAVENOUS

## 2022-07-13 MED ORDER — DEXAMETHASONE 4 MG PO TABS: ORAL_TABLET | ORAL | 0 refills | Status: AC

## 2022-07-13 MED ORDER — DEXAMETHASONE 4 MG PO TABS: 4.0000 mg | ORAL_TABLET | Freq: Four times a day (QID) | ORAL | Status: DC

## 2022-07-13 MED ORDER — TRAMADOL HCL 50 MG PO TABS: 100.0000 mg | ORAL_TABLET | Freq: Four times a day (QID) | ORAL | 0 refills | Status: AC | PRN

## 2022-07-13 MED ORDER — LEVETIRACETAM 500 MG PO TABS: 500.0000 mg | ORAL_TABLET | Freq: Two times a day (BID) | ORAL | Status: DC

## 2022-07-13 MED ORDER — PANTOPRAZOLE SODIUM 40 MG PO TBEC: 40.0000 mg | DELAYED_RELEASE_TABLET | Freq: Every day | ORAL | Status: DC

## 2022-07-13 MED ORDER — LEVETIRACETAM 500 MG PO TABS: 500.0000 mg | ORAL_TABLET | Freq: Two times a day (BID) | ORAL | 1 refills | Status: DC

## 2022-07-13 NOTE — Progress Notes (Signed)
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  She had steriotactic radiosurgery 10/4 and underwent  surgical resection of R occipital lesion 07/11/22.  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    PT Comments    Patient progressing with mobility able to negotiate obstacles, step over with cues and assist and back up.  Spouse present and will assist at d/c.  Noted RW delivered to room and HHPT set up.  Plans for d/c home today with family support.    Recommendations for follow up therapy are one component of a multi-disciplinary discharge planning process, led by the attending physician.  Recommendations may be updated based on patient status, additional functional criteria and insurance authorization.  Follow Up Recommendations  Home health PT     Assistance Recommended at Discharge Intermittent Supervision/Assistance  Patient can return home with the following A little help with walking and/or transfers;Assistance with cooking/housework;Help with stairs or ramp for entrance;Assist for transportation;A little help with bathing/dressing/bathroom   Equipment Recommendations  Rolling walker (2 wheels)    Recommendations for Other Services       Precautions / Restrictions Precautions Precautions: Fall     Mobility  Bed Mobility   Bed Mobility: Supine to Sit     Supine to sit: HOB elevated, Min guard     General bed mobility comments: assist for lines, lifting trunk    Transfers Overall transfer level: Needs assistance Equipment used: Rolling walker (2 wheels) Transfers: Sit to/from Stand Sit to Stand: Supervision                Ambulation/Gait Ambulation/Gait assistance: Min guard, Supervision Gait Distance (Feet):  200 Feet Assistive device: Rolling walker (2 wheels) Gait Pattern/deviations: Step-through pattern, Decreased stride length, Shuffle       General Gait Details: used standard size RW and practiced around obstacles, over obstacles in hallway and backward walking with cues and minguard A   Stairs             Wheelchair Mobility    Modified Rankin (Stroke Patients Only)       Balance Overall balance assessment: Needs assistance Sitting-balance support: Feet supported Sitting balance-Leahy Scale: Good       Standing balance-Leahy Scale: Fair Standing balance comment: static balance does not need UE support, but for dynamic tasks using RW                            Cognition Arousal/Alertness: Awake/alert Behavior During Therapy: WFL for tasks assessed/performed Overall Cognitive Status: Within Functional Limits for tasks assessed                                          Exercises      General Comments General comments (skin integrity, edema, etc.): VSS on RA, SpO2 92% after walking, increased to 95% dyspnea with ambulation 2/4      Pertinent Vitals/Pain Pain Assessment Pain Assessment: Faces Faces Pain Scale: Hurts even more Pain Location: R side of head Pain Descriptors / Indicators: Discomfort, Operative site guarding Pain Intervention(s): Monitored during session, RN gave pain meds during session    Home  Living                          Prior Function            PT Goals (current goals can now be found in the care plan section) Progress towards PT goals: Progressing toward goals    Frequency    Min 3X/week      PT Plan Current plan remains appropriate    Co-evaluation              AM-PAC PT "6 Clicks" Mobility   Outcome Measure  Help needed turning from your back to your side while in a flat bed without using bedrails?: A Little Help needed moving from lying on your back to sitting on the side  of a flat bed without using bedrails?: A Little Help needed moving to and from a bed to a chair (including a wheelchair)?: A Little Help needed standing up from a chair using your arms (e.g., wheelchair or bedside chair)?: A Little Help needed to walk in hospital room?: A Little Help needed climbing 3-5 steps with a railing? : Total 6 Click Score: 16    End of Session Equipment Utilized During Treatment: Gait belt Activity Tolerance: Patient tolerated treatment well Patient left: in chair;with call bell/phone within reach;with family/visitor present   PT Visit Diagnosis: Other abnormalities of gait and mobility (R26.89);Muscle weakness (generalized) (M62.81)     Time: 8177-1165 PT Time Calculation (min) (ACUTE ONLY): 23 min  Charges:  $Gait Training: 23-37 mins                     Magda Kiel, PT Acute Rehabilitation Services Office:[phone] 07/13/2022    Laurie Mccoy 07/13/2022, 11:02 AM

## 2022-07-13 NOTE — Discharge Summary (Signed)
Physician Discharge Summary  Patient ID: Laurie Mccoy MRN: [id number] DOB/AGE: 1941-03-04 81 y.o.  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 female who underwent an elective right occipital stereotactic craniotomy for resection of metastatic tumor.  She tolerated the surgery well.  She underwent preoperative stereotactic radiosurgery the day before surgery.  Postoperatively she was monitored in the ICU.  She continued to progress appropriately, was evaluated by PT/OT and recommended home health care.  She was tolerating normal diet, her pain was controlled on oral medication.  Her incision was clean dry and intact.  Treatments: Surgery -right stereotactic craniotomy, resection of tumor  Discharge Exam: Blood pressure (!) 126/57, pulse 69, temperature 98.3 F (36.8 C), temperature source Oral, resp. rate 16, height 5' 6.5" (1.689 m), weight 88.5 kg, SpO2 91 %. Awake, alert, oriented x3 PERRLA Speech fluent, appropriate CN grossly intact 5/5 BUE/BLE No drift Wound c/d/i  Disposition: Discharge disposition: 06-Home-Health Care Svc        Allergies as of 07/13/2022       Reactions   Amoxicillin    itching   Compazine [prochlorperazine]    Lose muscle control in face    Norvasc [amlodipine] Swelling   Lower extremity swelling    Trelegy Ellipta [fluticasone-umeclidin-vilant] Swelling   Swelling in legs and headache.        Medication List     TAKE these medications    acetaminophen 500 MG tablet Commonly known as: TYLENOL Take 500 mg by mouth every 6 (six) hours as needed for moderate pain.   albuterol (2.5 MG/3ML) 0.083%  nebulizer solution Commonly known as: PROVENTIL Take 3 mLs (2.5 mg total) by nebulization every 4 (four) hours as needed for wheezing or shortness of breath.   albuterol 108 (90 Base) MCG/ACT inhaler Commonly known as: VENTOLIN HFA Inhale 2 puffs into the lungs every 6 (six) hours as needed for wheezing or shortness of breath.   ALPRAZolam 0.25 MG tablet Commonly known as: XANAX Take 0.25 mg by mouth at bedtime as needed for anxiety.   Breztri Aerosphere 160-9-4.8 MCG/ACT Aero Generic drug: Budeson-Glycopyrrol-Formoterol INHALE 2 PUFFS BY MOUTH EVERY MORNING AND EVERY NIGHT AT BEDTIME   Claritin 10 MG tablet Generic drug: loratadine Take 10 mg by mouth at bedtime.   dexamethasone 4 MG tablet Commonly known as: DECADRON Take 1 tablet (4 mg total) by mouth 3 (three) times daily for 2 days, THEN 1 tablet (4 mg total) 2 (two) times daily for 2 days, THEN 0.5 tablets (2 mg total) 3 (three) times daily for 2 days, THEN 0.5 tablets (2 mg total) 2 (two) times daily for 12 days. Start the day of radiosurgery. Start taking on: July 13, 2022 What changed: See the new instructions.   hydrochlorothiazide 25 MG tablet Commonly known as: HYDRODIURIL Take 25 mg by mouth daily as needed (Leg swelling).   levETIRAcetam 500 MG tablet Commonly known as: Keppra Take 1 tablet (500 mg total) by mouth 2 (two) times daily.   LORazepam 1 MG tablet Commonly known as: ATIVAN Take 1 tablet (1 mg total) by mouth as needed. 30 min Prior to MRI or brain radiation procedure   metFORMIN 500 MG 24 hr tablet Commonly known  as: GLUCOPHAGE-XR Take 500 mg by mouth every morning.   ondansetron 8 MG tablet Commonly known as: ZOFRAN Take 1 tablet (8 mg total) by mouth every 8 (eight) hours as needed for nausea or vomiting.   telmisartan 20 MG tablet Commonly known as: MICARDIS Take 1 tablet (20 mg total) by mouth at bedtime.   traMADol 50 MG tablet Commonly known as: ULTRAM Take 2 tablets (100 mg total)  by mouth every 6 (six) hours as needed for severe pain.   UDDERLY SMOOTH EXTRA CARE 20 EX Apply 1 Application topically as needed (dry skin).               Durable Medical Equipment  (From admission, onward)           Start     Ordered   07/12/22 1309  For home use only DME Walker rolling  Once       Question Answer Comment  Walker: With 5 Inch Wheels   Patient needs a walker to treat with the following condition S/P craniotomy      07/12/22 1308            Follow-up Information     [facility] [place] Follow up.   Specialty: [place] Why: The home health agency will contact you for the first home visit Contact information: [place] [address] [phone]         Alyshia Kernan, Pieter Partridge C, DO Follow up in 2 week(s).   Contact information: [address] [place] [address] [phone]                 Signed: Theodoro Doing Mouhamed Glassco 07/13/2022, 9:36 AM

## 2022-07-13 NOTE — Progress Notes (Signed)
[place] with [facility] for DME (RW). Jasmine accepted the referral.

## 2022-07-13 NOTE — Evaluation (Addendum)
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  She had steriotactic radiosurgery 10/4 and underwent  surgical resection of R occipital lesion 07/11/22.  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   Clinical Impression   PTA, pt was living with her husband and was independent.  Pt currently requiring Supervision for ADLs and functional mobility with RW. Pt presenting with slight deficits in balance and processing speed. However, feel pt will progress well with time. Providing education on compensatory techniques for reducing bending to decrease HA and pain. Discussing to follow up with neurologist for OP referral if cognitive symptoms do not resolve. Recommend dc to home once medically stable. Answering all questions in preparation for possible dc today.      Recommendations for follow up therapy are one component of a multi-disciplinary discharge planning process, led by the attending physician.  Recommendations may be updated based on patient status, additional functional criteria and insurance authorization.   Follow Up Recommendations  No OT follow up (Follow up with OP OT if memory and processing speed does not improve; pt and husband agree)    Assistance Recommended at Discharge Intermittent Supervision/Assistance  Patient can return home with the following      Functional Status Assessment  Patient has had a recent decline in their functional status and demonstrates the ability to make significant improvements in function in a reasonable and predictable amount of time.  Equipment Recommendations  None recommended by OT    Recommendations for Other Services       Precautions / Restrictions Precautions Precautions: Fall Restrictions Weight  Bearing Restrictions: No      Mobility Bed Mobility               General bed mobility comments: In recliner upon arrival    Transfers Overall transfer level: Needs assistance Equipment used: Rolling walker (2 wheels) Transfers: Sit to/from Stand Sit to Stand: Supervision                  Balance Overall balance assessment: Needs assistance Sitting-balance support: No upper extremity supported, Feet supported Sitting balance-Leahy Scale: Good     Standing balance support: No upper extremity supported, During functional activity Standing balance-Leahy Scale: Fair                             ADL either performed or assessed with clinical judgement   ADL Overall ADL's : Needs assistance/impaired                                       General ADL Comments: Pt performing ADLs and functional mobility at Supervision level. Pt near baseline with slight deficits in balance and processing speed. Educating on compensatory techniques to decrease bending and pressure at head     Vision Baseline Vision/History: 1 Wears glasses Patient Visual Report: Other (comment) ("not as sharp") Additional Comments: Reports she doesnt track as fast (but this was happening right before surgery). Tracking during session and denies any blurry vision     Perception     Praxis      Pertinent Vitals/Pain Pain Assessment Pain Assessment: Faces Faces Pain Scale: Hurts  a little bit Pain Location: R side of head Pain Descriptors / Indicators: Discomfort, Operative site guarding Pain Intervention(s): Monitored during session     Hand Dominance Right   Extremity/Trunk Assessment Upper Extremity Assessment Upper Extremity Assessment: Overall WFL for tasks assessed   Lower Extremity Assessment Lower Extremity Assessment: Defer to PT evaluation   Cervical / Trunk Assessment Cervical / Trunk Assessment: Kyphotic   Communication Communication Communication:  No difficulties   Cognition Arousal/Alertness: Awake/alert Behavior During Therapy: WFL for tasks assessed/performed Overall Cognitive Status: Within Functional Limits for tasks assessed                                 General Comments: Able to name three animals that start with "F", money management question, and recall three words with increased time     General Comments  Husband present throughout    Exercises     Shoulder Instructions      Home Living Family/patient expects to be discharged to:: Private residence Living Arrangements: Spouse/significant other Available Help at Discharge: Family Type of Home: House Home Access: Ramped entrance     Home Layout: One level     Bathroom Shower/Tub: Hospital doctor Toilet: Handicapped height     Home Equipment: [place] - single point;Shower seat - built in   Additional Comments: Bidet and heated seat      Prior Functioning/Environment Prior Level of Function : Independent/Modified Independent               ADLs Comments: ADLs, IADLs, driving, and enjoys playing bridge every Friday with her friends        OT Problem List: Decreased activity tolerance;Decreased knowledge of use of DME or AE;Decreased knowledge of precautions      OT Treatment/Interventions:      OT Goals(Current goals can be found in the care plan section) Acute Rehab OT Goals Patient Stated Goal: Go home OT Goal Formulation: All assessment and education complete, DC therapy  OT Frequency:      Co-evaluation              AM-PAC OT "6 Clicks" Daily Activity     Outcome Measure Help from another person eating meals?: None Help from another person taking care of personal grooming?: None Help from another person toileting, which includes using toliet, bedpan, or urinal?: None Help from another person bathing (including washing, rinsing, drying)?: None Help from another person to put on and taking off regular upper  body clothing?: None Help from another person to put on and taking off regular lower body clothing?: None 6 Click Score: 24   End of Session Equipment Utilized During Treatment: Rolling walker (2 wheels) Nurse Communication: Mobility status  Activity Tolerance: Patient tolerated treatment well Patient left: in chair;with call bell/phone within reach;with family/visitor present;with nursing/sitter in room  OT Visit Diagnosis: Unsteadiness on feet (R26.81);Pain                Time: 2202-5427 OT Time Calculation (min): 29 min Charges:  OT General Charges $OT Visit: 1 Visit OT Evaluation $OT Eval Moderate Complexity: 1 Mod OT Treatments $Self Care/Home Management : 8-22 mins  Cliford Sequeira MSOT, OTR/L Acute Rehab Office: [place] 07/13/2022, 11:20 AM

## 2022-07-14 NOTE — Op Note (Signed)
Name: Laurie Mccoy    MRN: [id number]   Date: 07/10/2022    DOB: Nov 07, 1940   STEREOTACTIC RADIOSURGERY OPERATIVE NOTE  PRE-OPERATIVE DIAGNOSIS: Metastatic adenocarcinoma of the lung, to the brain, 3 lesions  POST-OPERATIVE DIAGNOSIS:  Same  PROCEDURE:  Stereotactic Radiosurgery, 3 lesions  SURGEON:  Elwin Sleight, DO  RADIATION ONCOLOGIST: Dr. Isidore Moos  TECHNIQUE:  The patient underwent a radiation treatment planning session in the radiation oncology simulation suite under the care of the radiation oncology physician and physicist.  I participated closely in the radiation treatment planning afterwards. The patient underwent planning CT which was fused to 3T high resolution MRI with 1 mm axial slices.  These images were fused on the planning system.  We contoured the gross target volumes and subsequently expanded this to yield the Planning Target Volume. I actively participated in the planning process.  I helped to define and review the target contours and also the contours of the optic pathway, eyes, brainstem and selected nearby organs at risk.  All the dose constraints for critical structures were reviewed and compared to AAPM Task Group 101.  The prescription dose conformity was reviewed.  I approved the plan electronically.    Accordingly, Laurie Mccoy  was brought to the TrueBeam stereotactic radiation treatment linac and placed in the custom immobilization mask.  The patient was aligned according to the IR fiducial markers with Brain[facility] Exactrac, then orthogonal x-rays were used in ExacTrac with the 6DOF robotic table and the shifts were made to align the patient.  Laurie Mccoy received stereotactic radiosurgery to a prescription dose of 20Gy uneventfully to the right temporal, left occipital lesions, 16 Gray to the right occipital lesion.    The detailed description of the procedure is recorded in the radiation oncology procedure note.  I was present for the duration of the  procedure.  DISPOSITION:   Following delivery, the patient was transported to nursing in stable condition and monitored for possible acute effects to be discharged to home in stable condition with planned surgical resection tomorrow of the right occipital lesion.  Elwin Sleight, [facility]

## 2022-07-15 ENCOUNTER — Ambulatory Visit
Admission: RE | Admit: 2022-07-15 | Discharge: 2022-07-15 | Disposition: A | Discharge status: Home / Self Care | Payer: Medicare Other | Source: Ambulatory Visit | Attending: Radiation Oncology | Admitting: Radiation Oncology

## 2022-07-15 ENCOUNTER — Other Ambulatory Visit: Payer: Self-pay

## 2022-07-15 ENCOUNTER — Ambulatory Visit: Payer: Medicare Other

## 2022-07-15 ENCOUNTER — Other Ambulatory Visit: Payer: Medicare Other

## 2022-07-15 ENCOUNTER — Encounter: Payer: Self-pay | Admitting: Internal Medicine

## 2022-07-15 DIAGNOSIS — C3490 Malignant neoplasm of unspecified part of unspecified bronchus or lung: Secondary | ICD-10-CM | POA: Diagnosis present

## 2022-07-15 DIAGNOSIS — Z510 Encounter for antineoplastic radiation therapy: Secondary | ICD-10-CM | POA: Diagnosis present

## 2022-07-15 DIAGNOSIS — C7931 Secondary malignant neoplasm of brain: Secondary | ICD-10-CM | POA: Diagnosis present

## 2022-07-15 LAB — RAD ONC ARIA SESSION SUMMARY
Course Elapsed Days: 5
Plan Fractions Treated to Date: 1
Plan Prescribed Dose Per Fraction: 2 Gy
Plan Total Fractions Prescribed: 30
Plan Total Prescribed Dose: 60 Gy
Reference Point Dosage Given to Date: 2 Gy
Reference Point Session Dosage Given: 2 Gy
Session Number: 2

## 2022-07-15 NOTE — Telephone Encounter (Signed)
Called Laurie Mccoy back and advised her that Dr. Sondra Come would like her to start radiation today and verified her appointment time at 2:15 this afternoon.

## 2022-07-16 ENCOUNTER — Ambulatory Visit
Admission: RE | Admit: 2022-07-16 | Discharge: 2022-07-16 | Disposition: A | Discharge status: Home / Self Care | Payer: Medicare Other | Source: Ambulatory Visit | Attending: Radiation Oncology | Admitting: Radiation Oncology

## 2022-07-16 ENCOUNTER — Other Ambulatory Visit: Payer: Self-pay

## 2022-07-16 DIAGNOSIS — Z510 Encounter for antineoplastic radiation therapy: Secondary | ICD-10-CM | POA: Diagnosis not present

## 2022-07-16 DIAGNOSIS — C3490 Malignant neoplasm of unspecified part of unspecified bronchus or lung: Secondary | ICD-10-CM

## 2022-07-16 LAB — RAD ONC ARIA SESSION SUMMARY
Course Elapsed Days: 6
Plan Fractions Treated to Date: 2
Plan Prescribed Dose Per Fraction: 2 Gy
Plan Total Fractions Prescribed: 30
Plan Total Prescribed Dose: 60 Gy
Reference Point Dosage Given to Date: 4 Gy
Reference Point Session Dosage Given: 2 Gy
Session Number: 3

## 2022-07-16 LAB — TYPE AND SCREEN
ABO/RH(D): O NEG
Antibody Screen: POSITIVE
Donor AG Type: NEGATIVE
Donor AG Type: NEGATIVE
PT AG Type: NEGATIVE
Unit division: 0
Unit division: 0

## 2022-07-16 LAB — BPAM RBC
Blood Product Expiration Date: 202311052359
Blood Product Expiration Date: 202311062359
Unit Type and Rh: 9500
Unit Type and Rh: 9500

## 2022-07-16 MED ORDER — SONAFINE EX EMUL
1.0000 | Freq: Once | CUTANEOUS | Status: AC
  Administered 2022-07-16: 1 via TOPICAL

## 2022-07-17 ENCOUNTER — Ambulatory Visit
Admission: RE | Admit: 2022-07-17 | Discharge: 2022-07-17 | Disposition: A | Discharge status: Home / Self Care | Payer: Medicare Other | Source: Ambulatory Visit | Attending: Radiation Oncology | Admitting: Radiation Oncology

## 2022-07-17 ENCOUNTER — Other Ambulatory Visit: Payer: Self-pay

## 2022-07-17 DIAGNOSIS — Z510 Encounter for antineoplastic radiation therapy: Secondary | ICD-10-CM | POA: Diagnosis not present

## 2022-07-17 LAB — RAD ONC ARIA SESSION SUMMARY
Course Elapsed Days: 7
Plan Fractions Treated to Date: 3
Plan Prescribed Dose Per Fraction: 2 Gy
Plan Total Fractions Prescribed: 30
Plan Total Prescribed Dose: 60 Gy
Reference Point Dosage Given to Date: 6 Gy
Reference Point Session Dosage Given: 2 Gy
Session Number: 4

## 2022-07-18 ENCOUNTER — Other Ambulatory Visit: Payer: Self-pay

## 2022-07-18 ENCOUNTER — Ambulatory Visit
Admission: RE | Admit: 2022-07-18 | Discharge: 2022-07-18 | Disposition: A | Discharge status: Home / Self Care | Payer: Medicare Other | Source: Ambulatory Visit | Attending: Radiation Oncology | Admitting: Radiation Oncology

## 2022-07-18 ENCOUNTER — Telehealth: Payer: Self-pay | Admitting: Cardiovascular Disease

## 2022-07-18 DIAGNOSIS — Z510 Encounter for antineoplastic radiation therapy: Secondary | ICD-10-CM | POA: Diagnosis not present

## 2022-07-18 LAB — RAD ONC ARIA SESSION SUMMARY
Course Elapsed Days: 8
Plan Fractions Treated to Date: 4
Plan Prescribed Dose Per Fraction: 2 Gy
Plan Total Fractions Prescribed: 30
Plan Total Prescribed Dose: 60 Gy
Reference Point Dosage Given to Date: 8 Gy
Reference Point Session Dosage Given: 2 Gy
Session Number: 5

## 2022-07-18 NOTE — Telephone Encounter (Signed)
Spoke with patient of Dr. Audie Box about her BP  She reports her BP is fluctuating d/t meds, brain surgery on 10/5 - is doing well post-op At night, BP is 122/65 for example (sometimes DBP in 40s) -- she said it "makes no sense to take [my] BP meds"  BP 152/52 - HR 49 BP 122/65 - HR 85 Taken 1 hour apart today  She is taking telmisartan 20mg  daily She is being weaned off seizure meds (keppra) and tapering off decadron  She is having radiation daily for 6 weeks Chemo is postponed for 3 weeks  Advised will send to Dr. Audie Box to review and advise

## 2022-07-18 NOTE — Telephone Encounter (Signed)
Pt c/o BP issue: STAT if pt c/o blurred vision, one-sided weakness or slurred speech  1. What are your last 5 BP readings?  152/52 49 HR 125/62 86 HR   2. Are you having any other symptoms (ex. Dizziness, headache, blurred vision, passed out)? No   3. What is your BP issue? Patient had surgery to remove tumor on brain and experiencing low bp and her doctor told her to stop taking her bp meds at night and she would like a call back to discuss her updates on health and also to make sure change in the way she takes her meds is okay. Please advise.

## 2022-07-19 ENCOUNTER — Other Ambulatory Visit: Payer: Self-pay

## 2022-07-19 ENCOUNTER — Ambulatory Visit
Admission: RE | Admit: 2022-07-19 | Discharge: 2022-07-19 | Disposition: A | Discharge status: Home / Self Care | Payer: Medicare Other | Source: Ambulatory Visit | Attending: Radiation Oncology | Admitting: Radiation Oncology

## 2022-07-19 DIAGNOSIS — Z510 Encounter for antineoplastic radiation therapy: Secondary | ICD-10-CM | POA: Diagnosis not present

## 2022-07-19 LAB — RAD ONC ARIA SESSION SUMMARY
Course Elapsed Days: 9
Plan Fractions Treated to Date: 5
Plan Prescribed Dose Per Fraction: 2 Gy
Plan Total Fractions Prescribed: 30
Plan Total Prescribed Dose: 60 Gy
Reference Point Dosage Given to Date: 10 Gy
Reference Point Session Dosage Given: 2 Gy
Session Number: 6

## 2022-07-19 MED FILL — Dexamethasone Sodium Phosphate Inj 100 MG/10ML: INTRAMUSCULAR | Qty: 1 | Status: AC

## 2022-07-19 NOTE — Telephone Encounter (Signed)
Spoke with patient and advised that Dr. Audie Box said to hold BP meds - stop telmisartan. Informed her he said to watch her BP for now. Explained if she has consistently high readings -- consecutive readings/day with BP elevated - to let us know. She asked about her DBP being in the 40s. Explained that we expect her BP to increase when stopping ARB. She asked for parameters on low BP. Informed her that if she has lightheadedness, dizziness, feeling excessively weak to let us know -- she said "well I just had brain surgery" - I explained I am aware of that and it may be difficult to discern between post-op residual effects and symptoms of low BP.Patient had no further questions.

## 2022-07-19 NOTE — Telephone Encounter (Signed)
Geralynn Rile, MD  Fidel Levy, RN Cc: Caprice Beaver, LPN Caller: Unspecified (Yesterday, 12:46 PM) OK to hold BP medications. Would just continue to watch it for now.   [place] T. Audie Box, MD, [place]  [address] [place]  [place], [place] [address]  [phone]  5:06 PM

## 2022-07-21 NOTE — Progress Notes (Unsigned)
[facility] OFFICE PROGRESS NOTE  [name], NP [facility] [address] [place] [address]  DIAGNOSIS: Stage IV (T2 a, N3, M1b) non-small cell lung cancer, adenocarcinoma presented with left upper lobe perihilar mass in addition to left hilar and mediastinal lymphadenopathy as well as left supraclavicular lymphadenopathy diagnosed in September 2023.  She also had a brain lesion.    Detected Alteration(s) / Biomarker(s)          Associated FDA-approved therapies  Clinical Trial Availability          % cfDNA or Amplification   KRAS G12C approved by FDA Adagrasib, Sotorasib Yes    1.7%   TP53 R249S None Yes          1.5%    PRIOR THERAPY: 1) SRS the the metastatic brain lesion under the care of Dr. Isidore Moos 2) Craniotomy on 07/10/22 under the care of Dr. Reatha Armour  CURRENT THERAPY: 1) Radiation to the chest under the care of Dr. Sondra Come, last dose 11/17.  2) systemic chemotherapy with carboplatin for an AUC of 5, to 500 mg per metered squared, and Keytruda 200 mg IV every 3 weeks.  First dose expected on 09/03/2022.  INTERVAL HISTORY: [name] 81 y.o. female returns to the clinic today for a follow-up visit accompanied by her husband.  The patient was last seen by Dr. Julien Nordmann on 07/01/2022.  Since last being seen, the patient underwent SRS and craniotomy to the metastatic brain lesion under the care of Dr. Reatha Armour. Due to the craniotomy, Dr. Reatha Armour recommended 1 month before receiving any chemotherapy to allow adequate time for wound healing. Due to this, the plan for systemic treatment will change. She is currently undergoing radiation to the lung mass under Dr. Sondra Come.  Last day radiation is tentatively scheduled for 11/17.   Since last being seen, she is feeling fairly well. She has some fatigue and mild weakness in the legs. Her PT and OT was postponed due to her not needing it now. She has a walker for ambulation sometimes. She denies fever, chills, night sweats, or  weight loss. She states she is sleeping well. She has a CPAP machine. Since starting radiation, she reports her breathing is better. She reports she is not coughing as much. Her dyspnea on better. She reports she workup once in the interval since last being seen with headache and chest discomfort. She reports she talked to her cardiologist about her low heart rate and her PCP. Some of her medications where discontinued. She reports some occasional chest tightness. She took tramadol. She is not sure if it was how she was sleeping. She had some non-exertional sternal discomfort during her visit today which she attributes to stress/anxiety. This was relieved by drinking water and belching. She reports she sometimes experiences this with her heartburn for which she takes medication. Denies numbness or tingling. She denies lightheadedness or associated shortness of breathing. Denies nausea, vomiting, diarrhea, or constipation. Denies headaches or vision changes. She has some agitation due to her decadron. She is here for a more detailed discussion about her current condition and recommended treatment options.     MEDICAL HISTORY: Past Medical History:  Diagnosis Date   Anxiety    "had panic attacks years ago"   Asthma    COPD (chronic obstructive pulmonary disease) ([facility])    Goiter 2022   History of hiatal hernia    "dx in college, never bothered me"   History of kidney stones 2012  Hypertension    Lung cancer ([place])    Sleep apnea     ALLERGIES:  is allergic to amoxicillin, compazine [prochlorperazine], norvasc [amlodipine], and trelegy ellipta [fluticasone-umeclidin-vilant].  MEDICATIONS:  Current Outpatient Medications  Medication Sig Dispense Refill   acetaminophen (TYLENOL) 500 MG tablet Take 500 mg by mouth every 6 (six) hours as needed for moderate pain.     albuterol (PROVENTIL) (2.5 MG/3ML) 0.083% nebulizer solution Take 3 mLs (2.5 mg total) by nebulization every 4 (four) hours as needed  for wheezing or shortness of breath.     albuterol (VENTOLIN HFA) 108 (90 Base) MCG/ACT inhaler Inhale 2 puffs into the lungs every 6 (six) hours as needed for wheezing or shortness of breath. 18 g 1   ALPRAZolam (XANAX) 0.25 MG tablet Take 0.25 mg by mouth at bedtime as needed for anxiety.     Budeson-Glycopyrrol-Formoterol (BREZTRI AEROSPHERE) 160-9-4.8 MCG/ACT AERO INHALE 2 PUFFS BY MOUTH EVERY MORNING AND EVERY NIGHT AT BEDTIME 10.7 g 5   dexamethasone (DECADRON) 4 MG tablet Take 1 tablet (4 mg total) by mouth 3 (three) times daily for 2 days, THEN 1 tablet (4 mg total) 2 (two) times daily for 2 days, THEN 0.5 tablets (2 mg total) 3 (three) times daily for 2 days, THEN 0.5 tablets (2 mg total) 2 (two) times daily for 12 days. Start the day of radiosurgery. 25 tablet 0   Emollient (UDDERLY SMOOTH EXTRA CARE 20 EX) Apply 1 Application topically as needed (dry skin).     fluconazole (DIFLUCAN) 150 MG tablet Take 150 mg by mouth daily. May repeat dose in 3 days if symptoms persist.     hydrochlorothiazide (HYDRODIURIL) 25 MG tablet Take 25 mg by mouth daily as needed (Leg swelling).     levETIRAcetam (KEPPRA) 500 MG tablet Take 1 tablet (500 mg total) by mouth 2 (two) times daily. 60 tablet 1   loratadine (CLARITIN) 10 MG tablet Take 10 mg by mouth at bedtime.     LORazepam (ATIVAN) 1 MG tablet Take 1 tablet (1 mg total) by mouth as needed. 30 min Prior to MRI or brain radiation procedure 5 tablet 0   metFORMIN (GLUCOPHAGE-XR) 500 MG 24 hr tablet Take 500 mg by mouth every morning. (Patient not taking: Reported on 06/26/2022)     ondansetron (ZOFRAN) 8 MG tablet Take 1 tablet (8 mg total) by mouth every 8 (eight) hours as needed for nausea or vomiting. 30 tablet 2   telmisartan (MICARDIS) 20 MG tablet Take 1 tablet (20 mg total) by mouth at bedtime. 90 tablet 1   traMADol (ULTRAM) 50 MG tablet Take 2 tablets (100 mg total) by mouth every 6 (six) hours as needed for severe pain. 30 tablet 0   No  current facility-administered medications for this visit.    SURGICAL HISTORY:  Past Surgical History:  Procedure Laterality Date   APPENDECTOMY     APPLICATION OF CRANIAL NAVIGATION N/A 07/11/2022   Procedure: APPLICATION OF CRANIAL NAVIGATION;  Surgeon: Dawley, Theodoro Doing, DO;  Location: [facility];  Service: Neurosurgery;  Laterality: N/A;   BRONCHIAL BIOPSY  06/12/2021   Procedure: BRONCHIAL BIOPSIES;  Surgeon: Garner Nash, DO;  Location: [facility];  Service: Pulmonary;;   BRONCHIAL BRUSHINGS  06/12/2021   Procedure: BRONCHIAL BRUSHINGS;  Surgeon: Garner Nash, DO;  Location: [facility];  Service: Pulmonary;;   BRONCHIAL WASHINGS  06/12/2021   Procedure: BRONCHIAL WASHINGS;  Surgeon: Garner Nash, DO;  Location: [facility];  Service: Pulmonary;;  CHOLECYSTECTOMY     COLON RESECTION     CRANIOTOMY N/A 07/11/2022   Procedure: Craniotomy for resection of tumor;  Surgeon: Dawley, Theodoro Doing, DO;  Location: [facility];  Service: Neurosurgery;  Laterality: N/A;  RM 19   FIDUCIAL MARKER PLACEMENT  06/12/2021   Procedure: FIDUCIAL MARKER PLACEMENT;  Surgeon: Garner Nash, DO;  Location: [facility];  Service: Pulmonary;;   hernia     x 2   TONSILLECTOMY     VIDEO BRONCHOSCOPY WITH ENDOBRONCHIAL NAVIGATION Bilateral 06/12/2021   Procedure: VIDEO BRONCHOSCOPY WITH ENDOBRONCHIAL NAVIGATION;  Surgeon: Garner Nash, DO;  Location: [facility];  Service: Pulmonary;  Laterality: Bilateral;  ION   VIDEO BRONCHOSCOPY WITH RADIAL ENDOBRONCHIAL ULTRASOUND  06/12/2021   Procedure: RADIAL ENDOBRONCHIAL ULTRASOUND;  Surgeon: Garner Nash, DO;  Location: [facility];  Service: Pulmonary;;    REVIEW OF SYSTEMS:   Review of Systems  Constitutional: Positive for fatigue. Negative for appetite change, chills, fever and unexpected weight change.  HENT: Negative for mouth sores, nosebleeds, sore throat and trouble swallowing.   Eyes: Negative for eye problems and icterus.  Respiratory:  Negative for cough, hemoptysis, shortness of breath and wheezing.   Cardiovascular: Positive for intermittent heartburn/chest discomfort. Negative for leg swelling.  Gastrointestinal: Negative for abdominal pain, constipation, diarrhea, nausea and vomiting.  Genitourinary: Negative for bladder incontinence, difficulty urinating, dysuria, frequency and hematuria.   Musculoskeletal: Negative for back pain, gait problem, neck pain and neck stiffness.  Skin: Negative for itching and rash.  Neurological: Positive for mild bilateral extremity weakness. Negative for dizziness, gait problem, headaches, light-headedness and seizures.  Hematological: Positive for easy bruising. Negative for adenopathy. Does not bleed easily.  Psychiatric/Behavioral: Negative for confusion, depression and sleep disturbance. The patient is not nervous/anxious.     PHYSICAL EXAMINATION:  There were no vitals taken for this visit.  ECOG PERFORMANCE STATUS: 1  Physical Exam  Constitutional: Oriented to person, place, and time and well-developed, well-nourished, and in no distress. HENT:  Head: Normocephalic and atraumatic.  Mouth/Throat: Oropharynx is clear and moist. No oropharyngeal exudate.  Eyes: Conjunctivae are normal. Right eye exhibits no discharge. Left eye exhibits no discharge. No scleral icterus.  Neck: Normal range of motion. Neck supple.  Cardiovascular: Normal rate, regular rhythm, normal heart sounds and intact distal pulses.   Pulmonary/Chest: Effort normal and breath sounds normal. No respiratory distress. No wheezes. No rales.  Abdominal: Soft. Bowel sounds are normal. Exhibits no distension and no mass. There is no tenderness.  Musculoskeletal: Normal range of motion. Exhibits no edema.  Lymphadenopathy:    No cervical adenopathy.  Neurological: Alert and oriented to person, place, and time. Exhibits normal muscle tone. Gait normal. Coordination normal.  Skin: Skin is warm and dry. No rash noted.  Not diaphoretic. No erythema. No pallor.  Psychiatric: Mood, memory and judgment normal.  Vitals reviewed.  LABORATORY DATA: Lab Results  Component Value Date   WBC 9.3 07/12/2022   HGB 11.6 (L) 07/12/2022   HCT 35.7 (L) 07/12/2022   MCV 97.8 07/12/2022   PLT 221 07/12/2022      Chemistry      Component Value Date/Time   NA 138 07/12/2022 0324   NA 139 09/17/2019 1550   K 4.2 07/12/2022 0324   CL 108 07/12/2022 0324   CO2 24 07/12/2022 0324   BUN 11 07/12/2022 0324   BUN 9 09/17/2019 1550   CREATININE 0.53 07/12/2022 0324   CREATININE 0.63 07/01/2022 1040  Component Value Date/Time   CALCIUM 8.1 (L) 07/12/2022 0324   ALKPHOS 57 07/01/2022 1040   AST 11 (L) 07/01/2022 1040   ALT 12 07/01/2022 1040   BILITOT 0.5 07/01/2022 1040       RADIOGRAPHIC STUDIES:  MR BRAIN W WO CONTRAST  Result Date: 07/13/2022 CLINICAL DATA:  Follow-up examination for brain metastases, prior craniotomy for tumor resection. EXAM: MRI HEAD WITHOUT AND WITH CONTRAST TECHNIQUE: Multiplanar, multiecho pulse sequences of the brain and surrounding structures were obtained without and with intravenous contrast. CONTRAST:  69m GADAVIST GADOBUTROL 1 MMOL/ML IV SOLN COMPARISON:  Previous MRI from 07/05/2022. FINDINGS: Brain: Postoperative changes from interval right occipital craniotomy for resection of previously identified right occipital metastasis. Tiny extra-axial collection subjacent to the craniotomy bone flap measures up to 3 mm in thickness without mass effect. The occipital mass has been resected. Fluid signal intensity with a few scattered foci of pneumocephalus fill the resection cavity. No significant or convincing enhancement to suggest residual tumor. Persistent vasogenic edema within this region extending to the right periatrial white matter is similar to prior. No peri-resection infarct or other complication identified. Additional 5 mm enhancing lesion at the contralateral left occipital  lobe again noted. Subtle 4 mm lesion at the peripheral right temporal lobe noted as well. These are stable as compared to recent preoperative exam. No other visible new lesions. No acute infarct or other abnormality. No other mass effect or midline shift. No hydrocephalus. Mild chronic microvascular ischemic disease for age noted, stable. No other abnormal enhancement. Vascular: Major intracranial vascular flow voids are maintained. Skull and upper cervical spine: Craniocervical junction normal. Bone marrow signal intensity within normal limits. No focal marrow replacing lesion. Post craniotomy changes present at the right occipital calvarium. Overlying skin staples in place. Sinuses/Orbits: Globes orbital soft tissues demonstrate no acute finding. Paranasal sinuses are largely clear. No mastoid effusion. Other: None. IMPRESSION: 1. Postoperative changes from interval right occipital craniotomy for resection of the right occipital metastasis. No evidence for residual tumor at this time. Persistent vasogenic edema within this region, similar to prior. No complication. 2. Additional subcentimeter enhancing lesions involving the left occipital lobe and right temporal lobe, stable from prior. 3. No other new acute intracranial abnormality. Electronically Signed   By: BJeannine BogaM.D.   On: 07/13/2022 02:37   CT Chest W Contrast  Result Date: 07/05/2022 CLINICAL DATA:  Non-small cell lung cancer. Pretreatment evaluation for lung cancer radiation. * Tracking Code: BO * EXAM: CT CHEST WITH CONTRAST TECHNIQUE: Multidetector CT imaging of the chest was performed during intravenous contrast administration. RADIATION DOSE REDUCTION: This exam was performed according to the departmental dose-optimization program which includes automated exposure control, adjustment of the mA and/or kV according to patient size and/or use of iterative reconstruction technique. CONTRAST:  1059mOMNIPAQUE IOHEXOL 300 MG/ML  SOLN  COMPARISON:  PET-CT PET-CT 05/02/2022, chest CT 03/29/2022 FINDINGS: Cardiovascular: No significant vascular findings. Normal heart size. No pericardial effusion. Mediastinum/Nodes: LEFT thyroid nodule measuring 2.6 cm does not have significant metabolic activity comparison PET scan. Subcarinal node measuring 12 mm is increased from 10 mm on comparison PET-CT scan. RIGHT lower paratracheal node also appears slightly more full at 14 mm compared to 12 mm. Small supraclavicular nodes scribed on PET-CT are not pathologically enlarged. High RIGHT paratracheal node measuring 10 mm is more prominent. . Lungs/Pleura: O[place]ong LEFT upper lobe perihilar mass measures 4.5 x 2.9 cm compared with 3.1 by 2.6 cm. Visually the mass appears slightly enlarged compared  to prior. No new pulmonary nodules are identified. Sub solid nodule with a central metallic density in the RIGHT upper lobe measures 15 mm by 13 mm compared to 14 mm x 10 mm for no significant oval change Upper Abdomen: Limited view of the liver, kidneys, pancreas are unremarkable. Normal adrenal glands. Musculoskeletal: No aggressive osseous lesion. IMPRESSION: 1. Mild enlargement of LEFT upper lobe perihilar mass. 2. Slight interval enlargement metastatic mediastinal lymph nodes. 3. Stable sub solid nodule in the RIGHT lung. Electronically Signed   By: Suzy Bouchard M.D.   On: 07/05/2022 15:18   MR Brain W Wo Contrast  Result Date: 07/05/2022 CLINICAL DATA:  Brain/CNS neoplasm, staging 3T SRS Protocol and surgical navigation MRI for Pre-OP SRS Treatment planning MRI EXAM: MRI HEAD WITHOUT AND WITH CONTRAST TECHNIQUE: Multiplanar, multiecho pulse sequences of the brain and surrounding structures were obtained without and with intravenous contrast. CONTRAST:  52m MULTIHANCE GADOBENATE DIMEGLUMINE 529 MG/ML IV SOLN COMPARISON:  Head 06/30/2022. FINDINGS: Brain: No substantial change in an approximately 3.8 x 3.2 cm heterogeneously enhancing right atypical mass.  Similar additional 5 mm left occipital lobe. Additional peripheral enhancing 4 mm enhancing lesion in the lateral right temporal lobe (series 14, image 66). Edema surrounding these lesions without midline shift. No hydrocephalus or acute hemorrhage. Scattered T2/FLAIR hyperintensities within the white matter, nonspecific but compatible with chronic microvascular ischemic disease. Vascular: Major arterial flow voids are maintained skull base. Skull and upper cervical spine: Normal marrow signal. Sinuses/Orbits: Negative. Other: No mastoid effusions. IMPRESSION: 1. No substantial change in bilateral occipital masses, compatible with metastases. 2. Additional peripheral enhancing 4 mm enhancing lesion in the lateral right temporal lobe (series 14, image 66), also concerning for a metastasis. Electronically Signed   By: FMargaretha SheffieldM.D.   On: 07/05/2022 15:08   MR BRAIN W WO CONTRAST  Result Date: 07/01/2022 CLINICAL DATA:  Non-small cell lung cancer (NSCLC), staging. EXAM: MRI HEAD WITHOUT AND WITH CONTRAST TECHNIQUE: Multiplanar, multiecho pulse sequences of the brain and surrounding structures were obtained without and with intravenous contrast. CONTRAST:  9 mL [employer]way COMPARISON:  None Available. FINDINGS: Brain: There is a 3.7 x 3.1 cm mixed cystic and solid mass in the right occipital lobe with moderate surrounding vasogenic edema resulting in mild mass effect on the posterior aspect of the right lateral ventricle. A ring-enhancing lesion in the left occipital lobe measures 4 mm with minimal edema. No acute infarct, midline shift, or extra-axial fluid collection is evident. Small T2 hyperintensities in the cerebral white matter bilaterally are nonspecific but compatible with mild chronic small vessel ischemic disease. Mild cerebral atrophy is within normal limits for age. A developmental venous anomaly is noted in the posterior left frontal lobe, likely with an associated cavernoma along the posterior  margin of the body of the left lateral ventricle. Vascular: Major intracranial vascular flow voids are preserved. Skull and upper cervical spine: Unremarkable bone marrow signal. Sinuses/Orbits: Unremarkable orbits. Paranasal sinuses and mastoid air cells are clear. Other: None. IMPRESSION: 1. 3.7 cm right occipital mass with moderate edema and 4 mm left occipital lesion, consistent with metastases. 2. Mild chronic small vessel ischemic disease. Electronically Signed   By: ALogan BoresM.D.   On: 07/01/2022 15:47     ASSESSMENT/PLAN:  This is a very pleasant 81years old Caucasian female with likely stage IV (T2a, N3, M1B) non-small cell lung cancer, adenocarcinoma presented with left upper lobe perihilar mass in addition to left hilar and mediastinal lymphadenopathy as well  as left supraclavicular lymphadenopathy and a solitary brain metastasis diagnosed in September 2023. The molecular studies showed positive KRAS G12C mutation. Discussed this can be used in the second line setting in the future.   The patient underwent SRS and craniotomy under the care of Dr. Isidore Moos and Dr. Reatha Armour on 07/10/22. Dr. Reatha Armour does not recommend any chemotherapy for at least 1 month from her surgery due to wound healing.   She is currently undergoing radiation to the chest under the care of Dr. Sondra Come. The last day of radiation is scheduled for 08/23/22  The patient was seen with Dr. Julien Nordmann. Dr. Julien Nordmann had a lengthy discussion with the patient about her current condition and recommended treatment options.  Dr. Julien Nordmann recommends arranging for systemic chemotherapy with carboplatin for an AUC of 5, Alimta 500 mg per metered square, Keytruda 200 mg IV every 3 weeks.  Dr. Julien Nordmann recommends starting after the patient completes radiation.  Therefore, Dr. Julien Nordmann recommends that we arrange for a follow-up visit around 11/21 with the intent of adding treatment the following week around 11/28.  The adverse side effects of  treatment were discussed including but not limited to fatigue, myelosuppression, nausea, vomiting, kidney, liver dysfunction.  I discussed with the patient will be see her back for follow-up visit on 11/21 we will arrange for B12 injection in the clinic that day.  I have sent a prescription for folic acid 1 mg p.o. daily to her pharmacy.  Once again she does not need to begin taking this until we see her around 11/21.  Patient has an allergy to Compazine.  Therefore a prescription for Zofran was sent to the pharmacy by Dr. Julien Nordmann with 2 refills last month.  Dr. Julien Nordmann recommends arranging for systemic chemotherapy and immunotherapy upon completion. stage IV non-small cell lung cancer with a combination of systemic chemotherapy with carboplatin, Alimta and Keytruda.  The patient developed chest discomfort during her encounter after the visit which she attributed to stress. Dr. Julien Nordmann feels her chest tightness which has been present on and off can be secondary to radiation effects. Her vitals are WNL. Her symptoms resolved with drinking water and belching. Reviewed S/Sx that would warrant ER evaluation including chest discomfort, nausea/vomiting, shortness of breath, lightheadedness, numbness/tingling, ect. The patient is concerned that her HR has been low recently. She saw her PCP to review this recently via telephone visit. She also has a cardiologist who she corresponded with the RN. If she has cardiac related concerns/questions about her medications, advised to follow up with them.   The patient was advised to call immediately if she has any concerning symptoms in the interval. The patient voices understanding of current disease status and treatment options and is in agreement with the current care plan. All questions were answered. The patient knows to call the clinic with any problems, questions or concerns. We can certainly see the patient much sooner if necessary  No orders of the defined types  were placed in this encounter.   Demetres Prochnow L Bernise Sylvain, PA-C 07/21/22  ADDENDUM: Hematology/Oncology Attending: I had a face-to-face encounter with the patient today.  I reviewed her records, lab and recommended her care plan.  This is a very pleasant 81 years old white female diagnosed with a stage IV (T2 a, N3, M1b) non-small cell lung cancer, adenocarcinoma in September 2023 with positive KRAS G12C mutation.  The patient is status post SRS followed by craniotomy with resection of the metastatic brain lesion on July 07, 2022. She is  currently undergoing a palliative course of radiotherapy to the chest under the care of Dr. Sondra Come and expected to be completed on August 23, 2022.  The patient was supposed to have concurrent chemotherapy with this course of radiation but because of the recent surgery and request from neurosurgery to delay any treatment by at least 4 weeks, I recommended for the patient to continue with the palliative radiotherapy as single modality for now. After completion of the course of radiotherapy, I would consider starting the patient on systemic chemotherapy with full dose carboplatin for AUC of 5, Alimta 500 Mg/M2 and Keytruda 200 Mg IV every 3 weeks tentatively on September 03, 2022. We will see the patient for follow-up visit the week before her treatment and she will receive vitamin B12 injection and started treatment with folic acid at that time. The patient and her husband are in agreement with the current plan.  They had several questions and I answered them completely to their satisfaction. She was advised to call immediately if she has any other concerning symptoms in the interval. The total time spent in the appointment was 30 minutes. Disclaimer: This note was dictated with voice recognition software. Similar sounding words can inadvertently be transcribed and may be missed upon review. Eilleen Kempf, MD

## 2022-07-22 ENCOUNTER — Inpatient Hospital Stay: Payer: Medicare Other

## 2022-07-22 ENCOUNTER — Other Ambulatory Visit: Payer: Self-pay

## 2022-07-22 ENCOUNTER — Ambulatory Visit
Admission: RE | Admit: 2022-07-22 | Discharge: 2022-07-22 | Disposition: A | Discharge status: Home / Self Care | Payer: Medicare Other | Source: Ambulatory Visit | Attending: Radiation Oncology | Admitting: Radiation Oncology

## 2022-07-22 ENCOUNTER — Ambulatory Visit: Payer: Medicare Other

## 2022-07-22 ENCOUNTER — Inpatient Hospital Stay (HOSPITAL_BASED_OUTPATIENT_CLINIC_OR_DEPARTMENT_OTHER): Payer: Medicare Other | Admitting: Physician Assistant

## 2022-07-22 VITALS — BP 148/67 | HR 74 | Temp 98.6°F | Resp 17 | Ht 66.5 in | Wt 201.2 lb

## 2022-07-22 DIAGNOSIS — C3492 Malignant neoplasm of unspecified part of left bronchus or lung: Secondary | ICD-10-CM

## 2022-07-22 DIAGNOSIS — C7932 Secondary malignant neoplasm of cerebral meninges: Secondary | ICD-10-CM | POA: Diagnosis not present

## 2022-07-22 DIAGNOSIS — C3490 Malignant neoplasm of unspecified part of unspecified bronchus or lung: Secondary | ICD-10-CM

## 2022-07-22 DIAGNOSIS — Z510 Encounter for antineoplastic radiation therapy: Secondary | ICD-10-CM | POA: Diagnosis not present

## 2022-07-22 LAB — RAD ONC ARIA SESSION SUMMARY
Course Elapsed Days: 12
Plan Fractions Treated to Date: 6
Plan Prescribed Dose Per Fraction: 2 Gy
Plan Total Fractions Prescribed: 30
Plan Total Prescribed Dose: 60 Gy
Reference Point Dosage Given to Date: 12 Gy
Reference Point Session Dosage Given: 2 Gy
Session Number: 7

## 2022-07-22 MED ORDER — FOLIC ACID 1 MG PO TABS: 1.0000 mg | ORAL_TABLET | Freq: Every day | ORAL | 2 refills | Status: DC

## 2022-07-22 MED ORDER — ONDANSETRON HCL 8 MG PO TABS: 8.0000 mg | ORAL_TABLET | Freq: Three times a day (TID) | ORAL | 2 refills | Status: AC | PRN

## 2022-07-22 NOTE — Patient Instructions (Addendum)
Summary:  -There are two main categories of lung cancer, they are named based on the size of the cancer cell. One is called Non-Small cell lung cancer. 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 This is the most common type of lung cancer.  -We covered a lot of important information at your appointment today regarding what the treatment plan is moving forward. Here are the the main points that were discussed at your office visit with [place] today:  -The treatment that you will receive consists of two chemotherapy drugs, called Carboplatin and Alimta (also called Pemetrexed) and one immunotherapy drug called Keytruda (pembrolizumab).  -We are planning on starting your treatment on ~09/03/22 but before your start your treatment, I would like you to attend a Chemotherapy Education Class. This involves having you sit down with one of our nurse educators. She will discuss with your one-on-one more details about your treatment as well as general information about resources here at the cancer center.  -Your treatment will be given once every 3 weeks. We will check your labs once a week for the first ~5 treatments just to make sure that important components of your blood are in an acceptable range -We will get a CT scan after 3 treatments to check on the progress of treatment -Starting from the 5th treatment, you will be on two medications (keytruda and Alimta) every 3 weeks there on after. Beryle Flock was studied up to two years so after two years, we will stop after 2 years and you will only continue on Alimta alone.  -I have handouts on these medications to read about.  -Side effects could include fatigue, nausea/vomiting, drops in the blood count, kidney/liver.   Medications:  -I have sent a few important medication prescriptions to your pharmacy.  -Compazine was sent to your  pharmacy. This medication is for nausea. You may take this every 6 hours as needed if you feel nauseous.  -I have also sent a prescription for 1 mg of folic acid to your pharmacy. We need you to take 1 tablet every day.  -We will administer vitamin B12 every 9 weeks while you are here in the clinic. You have received your first dose at your next appointment.   Referrals or Imaging:   Follow up:  -We will see you back for a follow up visit with your first treatment to see how it went and help manage any side effects of treatment that you may have   -If you need to reach [place] at any time, the main office number to the cancer center is [phone], when you call, ask to speak to either Cassie's or Dr. Worthy Flank nurse.

## 2022-07-23 ENCOUNTER — Other Ambulatory Visit: Payer: Self-pay

## 2022-07-23 ENCOUNTER — Ambulatory Visit
Admission: RE | Admit: 2022-07-23 | Discharge: 2022-07-23 | Disposition: A | Discharge status: Home / Self Care | Payer: Medicare Other | Source: Ambulatory Visit | Attending: Radiation Oncology | Admitting: Radiation Oncology

## 2022-07-23 DIAGNOSIS — Z510 Encounter for antineoplastic radiation therapy: Secondary | ICD-10-CM | POA: Diagnosis not present

## 2022-07-23 LAB — RAD ONC ARIA SESSION SUMMARY
Course Elapsed Days: 13
Plan Fractions Treated to Date: 7
Plan Prescribed Dose Per Fraction: 2 Gy
Plan Total Fractions Prescribed: 30
Plan Total Prescribed Dose: 60 Gy
Reference Point Dosage Given to Date: 14 Gy
Reference Point Session Dosage Given: 2 Gy
Session Number: 8

## 2022-07-24 ENCOUNTER — Other Ambulatory Visit: Payer: Self-pay

## 2022-07-24 ENCOUNTER — Ambulatory Visit
Admission: RE | Admit: 2022-07-24 | Discharge: 2022-07-24 | Disposition: A | Discharge status: Home / Self Care | Payer: Medicare Other | Source: Ambulatory Visit | Attending: Radiation Oncology | Admitting: Radiation Oncology

## 2022-07-24 DIAGNOSIS — Z510 Encounter for antineoplastic radiation therapy: Secondary | ICD-10-CM | POA: Diagnosis not present

## 2022-07-24 LAB — RAD ONC ARIA SESSION SUMMARY
Course Elapsed Days: 14
Plan Fractions Treated to Date: 8
Plan Prescribed Dose Per Fraction: 2 Gy
Plan Total Fractions Prescribed: 30
Plan Total Prescribed Dose: 60 Gy
Reference Point Dosage Given to Date: 16 Gy
Reference Point Session Dosage Given: 2 Gy
Session Number: 9

## 2022-07-25 ENCOUNTER — Ambulatory Visit
Admission: RE | Admit: 2022-07-25 | Discharge: 2022-07-25 | Disposition: A | Discharge status: Home / Self Care | Payer: Medicare Other | Source: Ambulatory Visit | Attending: Radiation Oncology | Admitting: Radiation Oncology

## 2022-07-25 ENCOUNTER — Other Ambulatory Visit: Payer: Self-pay

## 2022-07-25 DIAGNOSIS — Z510 Encounter for antineoplastic radiation therapy: Secondary | ICD-10-CM | POA: Diagnosis not present

## 2022-07-25 LAB — RAD ONC ARIA SESSION SUMMARY
Course Elapsed Days: 15
Plan Fractions Treated to Date: 9
Plan Prescribed Dose Per Fraction: 2 Gy
Plan Total Fractions Prescribed: 30
Plan Total Prescribed Dose: 60 Gy
Reference Point Dosage Given to Date: 18 Gy
Reference Point Session Dosage Given: 2 Gy
Session Number: 10

## 2022-07-26 ENCOUNTER — Ambulatory Visit
Admission: RE | Admit: 2022-07-26 | Discharge: 2022-07-26 | Disposition: A | Discharge status: Home / Self Care | Payer: Medicare Other | Source: Ambulatory Visit | Attending: Radiation Oncology | Admitting: Radiation Oncology

## 2022-07-26 ENCOUNTER — Other Ambulatory Visit: Payer: Self-pay

## 2022-07-26 DIAGNOSIS — Z510 Encounter for antineoplastic radiation therapy: Secondary | ICD-10-CM | POA: Diagnosis not present

## 2022-07-26 LAB — RAD ONC ARIA SESSION SUMMARY
Course Elapsed Days: 16
Plan Fractions Treated to Date: 10
Plan Prescribed Dose Per Fraction: 2 Gy
Plan Total Fractions Prescribed: 30
Plan Total Prescribed Dose: 60 Gy
Reference Point Dosage Given to Date: 20 Gy
Reference Point Session Dosage Given: 2 Gy
Session Number: 11

## 2022-07-29 ENCOUNTER — Ambulatory Visit
Admission: RE | Admit: 2022-07-29 | Discharge: 2022-07-29 | Disposition: A | Discharge status: Home / Self Care | Payer: Medicare Other | Source: Ambulatory Visit | Attending: Radiation Oncology | Admitting: Radiation Oncology

## 2022-07-29 ENCOUNTER — Ambulatory Visit: Payer: Medicare Other

## 2022-07-29 ENCOUNTER — Other Ambulatory Visit: Payer: Medicare Other

## 2022-07-29 ENCOUNTER — Other Ambulatory Visit: Payer: Self-pay | Admitting: Physician Assistant

## 2022-07-29 ENCOUNTER — Other Ambulatory Visit: Payer: Self-pay

## 2022-07-29 ENCOUNTER — Telehealth: Payer: Self-pay | Admitting: Cardiovascular Disease

## 2022-07-29 DIAGNOSIS — Z510 Encounter for antineoplastic radiation therapy: Secondary | ICD-10-CM | POA: Diagnosis not present

## 2022-07-29 LAB — RAD ONC ARIA SESSION SUMMARY
Course Elapsed Days: 19
Plan Fractions Treated to Date: 11
Plan Prescribed Dose Per Fraction: 2 Gy
Plan Total Fractions Prescribed: 30
Plan Total Prescribed Dose: 60 Gy
Reference Point Dosage Given to Date: 22 Gy
Reference Point Session Dosage Given: 2 Gy
Session Number: 12

## 2022-07-29 NOTE — Telephone Encounter (Signed)
Patient stated she has not taken HCTZ for 2 days and has edema in both legs; rt more than left. The swelling decreases with leg elevation. She wears support stockings, but doesn't put them on before getting OOB. Explained the reason for putting them on before getting OOB. Her SOB from COPD has not worsened. Current BP 121/57; 118/56, P 70. She is not taking telmisartan. She stated her oncologist wants her to drink 65 oz per day. Recommended that she follow treatment plan as prescribed. She will take HCTZ  25mg  when she gets off the phone with me. She will keep a diary of BP and monitor edema. She wants Dr. Audie Box to review the oncologist's reports because she wants Dr. Kathalene Frames input.

## 2022-07-29 NOTE — Telephone Encounter (Signed)
Pt c/o BP issue: STAT if pt c/o blurred vision, one-sided weakness or slurred speech  1. What are your last 5 BP readings?   121/57 hr70 118/56  hr68 After treatment: bottom number down to 20   2. Are you having any other symptoms (ex. Dizziness, headache, blurred vision, passed out)? no  3. What is your BP issue? 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 She states after radiation her bottom number went to 20 and they advised her to contact Dr. Audie Box. She states her legs are swelling and her bp is low so she stopped taking  her telmisartan. She states she will put on her heavy socks for the swelling. She was told to drink lots of fluids to help her with her cancer treatment. Pt denies any other symptoms  Pt c/o swelling: STAT is pt has developed SOB within 24 hours  If swelling, where is the swelling located? Legs  How much weight have you gained and in what time span? Not sure   Have you gained 3 pounds in a day or 5 pounds in a week? Not sure   Do you have a log of your daily weights (if so, list)? Not sure   Are you currently taking a fluid pill? No   Are you currently SOB? no  Have you traveled recently? No

## 2022-07-29 NOTE — Telephone Encounter (Signed)
Patient advised that if the swelling in her legs does not decrease, she can be scheduled with an APP. She said she has visit with oncologist tomorrow and will contact us if she needs to come in.

## 2022-07-30 ENCOUNTER — Ambulatory Visit
Admission: RE | Admit: 2022-07-30 | Discharge: 2022-07-30 | Disposition: A | Discharge status: Home / Self Care | Payer: Medicare Other | Source: Ambulatory Visit | Attending: Radiation Oncology | Admitting: Radiation Oncology

## 2022-07-30 ENCOUNTER — Other Ambulatory Visit: Payer: Self-pay

## 2022-07-30 ENCOUNTER — Other Ambulatory Visit: Payer: Self-pay | Admitting: Radiation Oncology

## 2022-07-30 DIAGNOSIS — Z510 Encounter for antineoplastic radiation therapy: Secondary | ICD-10-CM | POA: Diagnosis not present

## 2022-07-30 LAB — RAD ONC ARIA SESSION SUMMARY
Course Elapsed Days: 20
Plan Fractions Treated to Date: 12
Plan Prescribed Dose Per Fraction: 2 Gy
Plan Total Fractions Prescribed: 30
Plan Total Prescribed Dose: 60 Gy
Reference Point Dosage Given to Date: 24 Gy
Reference Point Session Dosage Given: 2 Gy
Session Number: 13

## 2022-07-30 MED ORDER — SUCRALFATE 1 G PO TABS: 1.0000 g | ORAL_TABLET | Freq: Three times a day (TID) | ORAL | 1 refills | Status: DC

## 2022-07-31 ENCOUNTER — Telehealth: Payer: Self-pay | Admitting: Oncology

## 2022-07-31 ENCOUNTER — Other Ambulatory Visit: Payer: Self-pay

## 2022-07-31 ENCOUNTER — Ambulatory Visit
Admission: RE | Admit: 2022-07-31 | Discharge: 2022-07-31 | Disposition: A | Discharge status: Home / Self Care | Payer: Medicare Other | Source: Ambulatory Visit | Attending: Radiation Oncology | Admitting: Radiation Oncology

## 2022-07-31 DIAGNOSIS — Z510 Encounter for antineoplastic radiation therapy: Secondary | ICD-10-CM | POA: Diagnosis not present

## 2022-07-31 LAB — RAD ONC ARIA SESSION SUMMARY
Course Elapsed Days: 21
Plan Fractions Treated to Date: 13
Plan Prescribed Dose Per Fraction: 2 Gy
Plan Total Fractions Prescribed: 30
Plan Total Prescribed Dose: 60 Gy
Reference Point Dosage Given to Date: 26 Gy
Reference Point Session Dosage Given: 2 Gy
Session Number: 14

## 2022-07-31 MED ORDER — SUCRALFATE 1 GM/10ML PO SUSP: 1.0000 g | Freq: Three times a day (TID) | ORAL | 1 refills | Status: DC

## 2022-07-31 NOTE — Telephone Encounter (Signed)
Received message from [place] that patient would like liquid Carafate.  Called Juliann Pulse and she said dissolving the pills in water is not helping.  She would like to try the liquid and knows that it will be an extra cost for her.  Order placed for Carafate suspension per Dr. Sondra Come.   Verified with [facility] that they received the new prescription for Carafate suspension.

## 2022-08-01 ENCOUNTER — Ambulatory Visit
Admission: RE | Admit: 2022-08-01 | Discharge: 2022-08-01 | Disposition: A | Discharge status: Home / Self Care | Payer: Medicare Other | Source: Ambulatory Visit | Attending: Radiation Oncology | Admitting: Radiation Oncology

## 2022-08-01 ENCOUNTER — Other Ambulatory Visit: Payer: Self-pay

## 2022-08-01 ENCOUNTER — Ambulatory Visit: Payer: Medicare Other

## 2022-08-01 ENCOUNTER — Telehealth: Payer: Self-pay | Admitting: Internal Medicine

## 2022-08-01 DIAGNOSIS — Z510 Encounter for antineoplastic radiation therapy: Secondary | ICD-10-CM | POA: Diagnosis not present

## 2022-08-01 LAB — RAD ONC ARIA SESSION SUMMARY
Course Elapsed Days: 22
Plan Fractions Treated to Date: 14
Plan Prescribed Dose Per Fraction: 2 Gy
Plan Total Fractions Prescribed: 30
Plan Total Prescribed Dose: 60 Gy
Reference Point Dosage Given to Date: 28 Gy
Reference Point Session Dosage Given: 2 Gy
Session Number: 15

## 2022-08-01 NOTE — Telephone Encounter (Signed)
Called and spoke with pt letting her know that [facility]'s first available appt for a new pt switch was December 13. Pt was unsatisfied with that appt and did not want to make an appt at this time. Pt stated she would discuss this with her other providers since we could not get her in with either Dr. Lamonte Sakai or Dr. Elsworth Soho at a sooner time.

## 2022-08-01 NOTE — Telephone Encounter (Signed)
She has seen 2 of our providers and I would prefer to avoid making another switch. If she doesn;t want to see Dr Melvyn Novas or Dr Valeta Harms then she may need to get a referral outside our practice

## 2022-08-01 NOTE — Telephone Encounter (Signed)
Called and spoke with pt letting her know the info per [facility] and she verbalized understanding stating that she would speak with her oncologist. Nothing further needed.

## 2022-08-01 NOTE — Telephone Encounter (Signed)
Fine with me

## 2022-08-01 NOTE — Telephone Encounter (Signed)
Please advise on provider change?

## 2022-08-02 ENCOUNTER — Ambulatory Visit
Admission: RE | Admit: 2022-08-02 | Discharge: 2022-08-02 | Disposition: A | Discharge status: Home / Self Care | Payer: Medicare Other | Source: Ambulatory Visit | Attending: Radiation Oncology | Admitting: Radiation Oncology

## 2022-08-02 ENCOUNTER — Telehealth: Payer: Self-pay

## 2022-08-02 ENCOUNTER — Other Ambulatory Visit: Payer: Self-pay

## 2022-08-02 DIAGNOSIS — Z510 Encounter for antineoplastic radiation therapy: Secondary | ICD-10-CM | POA: Diagnosis not present

## 2022-08-02 LAB — RAD ONC ARIA SESSION SUMMARY
Course Elapsed Days: 23
Plan Fractions Treated to Date: 15
Plan Prescribed Dose Per Fraction: 2 Gy
Plan Total Fractions Prescribed: 30
Plan Total Prescribed Dose: 60 Gy
Reference Point Dosage Given to Date: 30 Gy
Reference Point Session Dosage Given: 2 Gy
Session Number: 16

## 2022-08-02 NOTE — Telephone Encounter (Signed)
Patient called to say that she has been discharged from [place] at [facility]. She is requesting a referral to a new pulmonology practice.

## 2022-08-05 ENCOUNTER — Ambulatory Visit: Payer: Medicare Other | Admitting: Internal Medicine

## 2022-08-05 ENCOUNTER — Ambulatory Visit: Payer: Medicare Other

## 2022-08-05 ENCOUNTER — Ambulatory Visit
Admission: RE | Admit: 2022-08-05 | Discharge: 2022-08-05 | Disposition: A | Discharge status: Home / Self Care | Payer: Medicare Other | Source: Ambulatory Visit | Attending: Radiation Oncology | Admitting: Radiation Oncology

## 2022-08-05 ENCOUNTER — Other Ambulatory Visit: Payer: Medicare Other

## 2022-08-05 ENCOUNTER — Other Ambulatory Visit: Payer: Self-pay

## 2022-08-05 DIAGNOSIS — Z510 Encounter for antineoplastic radiation therapy: Secondary | ICD-10-CM | POA: Diagnosis not present

## 2022-08-05 LAB — RAD ONC ARIA SESSION SUMMARY
Course Elapsed Days: 26
Plan Fractions Treated to Date: 16
Plan Prescribed Dose Per Fraction: 2 Gy
Plan Total Fractions Prescribed: 30
Plan Total Prescribed Dose: 60 Gy
Reference Point Dosage Given to Date: 32 Gy
Reference Point Session Dosage Given: 2 Gy
Session Number: 17

## 2022-08-06 ENCOUNTER — Other Ambulatory Visit: Payer: Self-pay

## 2022-08-06 ENCOUNTER — Ambulatory Visit
Admission: RE | Admit: 2022-08-06 | Discharge: 2022-08-06 | Disposition: A | Discharge status: Home / Self Care | Payer: Medicare Other | Source: Ambulatory Visit | Attending: Radiation Oncology | Admitting: Radiation Oncology

## 2022-08-06 DIAGNOSIS — Z510 Encounter for antineoplastic radiation therapy: Secondary | ICD-10-CM | POA: Diagnosis not present

## 2022-08-06 LAB — RAD ONC ARIA SESSION SUMMARY
Course Elapsed Days: 27
Plan Fractions Treated to Date: 17
Plan Prescribed Dose Per Fraction: 2 Gy
Plan Total Fractions Prescribed: 30
Plan Total Prescribed Dose: 60 Gy
Reference Point Dosage Given to Date: 34 Gy
Reference Point Session Dosage Given: 2 Gy
Session Number: 18

## 2022-08-06 NOTE — Progress Notes (Signed)
Ms. Blue presents today for follow up after completing srs of the brain on 07-11-22.   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   Vitals:   08/13/22 1536  BP: (!) 144/43  Pulse: 84  Resp: (!) 22  Temp: 97.6 F (36.4 C)  SpO2: 98%

## 2022-08-07 ENCOUNTER — Ambulatory Visit
Admission: RE | Admit: 2022-08-07 | Discharge: 2022-08-07 | Disposition: A | Discharge status: Home / Self Care | Payer: Medicare Other | Source: Ambulatory Visit | Attending: Radiation Oncology | Admitting: Radiation Oncology

## 2022-08-07 ENCOUNTER — Other Ambulatory Visit: Payer: Self-pay

## 2022-08-07 DIAGNOSIS — C7931 Secondary malignant neoplasm of brain: Secondary | ICD-10-CM | POA: Insufficient documentation

## 2022-08-07 DIAGNOSIS — Z510 Encounter for antineoplastic radiation therapy: Secondary | ICD-10-CM | POA: Diagnosis present

## 2022-08-07 DIAGNOSIS — C3490 Malignant neoplasm of unspecified part of unspecified bronchus or lung: Secondary | ICD-10-CM | POA: Insufficient documentation

## 2022-08-07 LAB — RAD ONC ARIA SESSION SUMMARY
Course Elapsed Days: 28
Plan Fractions Treated to Date: 18
Plan Prescribed Dose Per Fraction: 2 Gy
Plan Total Fractions Prescribed: 30
Plan Total Prescribed Dose: 60 Gy
Reference Point Dosage Given to Date: 36 Gy
Reference Point Session Dosage Given: 2 Gy
Session Number: 19

## 2022-08-08 ENCOUNTER — Ambulatory Visit
Admission: RE | Admit: 2022-08-08 | Discharge: 2022-08-08 | Disposition: A | Discharge status: Home / Self Care | Payer: Medicare Other | Source: Ambulatory Visit | Attending: Radiation Oncology | Admitting: Radiation Oncology

## 2022-08-08 ENCOUNTER — Other Ambulatory Visit: Payer: Self-pay

## 2022-08-08 DIAGNOSIS — Z510 Encounter for antineoplastic radiation therapy: Secondary | ICD-10-CM | POA: Diagnosis not present

## 2022-08-08 LAB — RAD ONC ARIA SESSION SUMMARY
Course Elapsed Days: 29
Plan Fractions Treated to Date: 19
Plan Prescribed Dose Per Fraction: 2 Gy
Plan Total Fractions Prescribed: 30
Plan Total Prescribed Dose: 60 Gy
Reference Point Dosage Given to Date: 38 Gy
Reference Point Session Dosage Given: 2 Gy
Session Number: 20

## 2022-08-09 ENCOUNTER — Ambulatory Visit
Admission: RE | Admit: 2022-08-09 | Discharge: 2022-08-09 | Disposition: A | Discharge status: Home / Self Care | Payer: Medicare Other | Source: Ambulatory Visit | Attending: Radiation Oncology | Admitting: Radiation Oncology

## 2022-08-09 ENCOUNTER — Other Ambulatory Visit: Payer: Self-pay

## 2022-08-09 DIAGNOSIS — Z510 Encounter for antineoplastic radiation therapy: Secondary | ICD-10-CM | POA: Diagnosis not present

## 2022-08-09 LAB — RAD ONC ARIA SESSION SUMMARY
Course Elapsed Days: 30
Plan Fractions Treated to Date: 20
Plan Prescribed Dose Per Fraction: 2 Gy
Plan Total Fractions Prescribed: 30
Plan Total Prescribed Dose: 60 Gy
Reference Point Dosage Given to Date: 40 Gy
Reference Point Session Dosage Given: 2 Gy
Session Number: 21

## 2022-08-12 ENCOUNTER — Other Ambulatory Visit: Payer: Self-pay

## 2022-08-12 ENCOUNTER — Other Ambulatory Visit: Payer: Medicare Other

## 2022-08-12 ENCOUNTER — Ambulatory Visit
Admission: RE | Admit: 2022-08-12 | Discharge: 2022-08-12 | Disposition: A | Discharge status: Home / Self Care | Payer: Medicare Other | Source: Ambulatory Visit | Attending: Radiation Oncology | Admitting: Radiation Oncology

## 2022-08-12 ENCOUNTER — Ambulatory Visit: Payer: Medicare Other

## 2022-08-12 DIAGNOSIS — Z510 Encounter for antineoplastic radiation therapy: Secondary | ICD-10-CM | POA: Diagnosis not present

## 2022-08-12 LAB — RAD ONC ARIA SESSION SUMMARY
Course Elapsed Days: 33
Plan Fractions Treated to Date: 21
Plan Prescribed Dose Per Fraction: 2 Gy
Plan Total Fractions Prescribed: 30
Plan Total Prescribed Dose: 60 Gy
Reference Point Dosage Given to Date: 42 Gy
Reference Point Session Dosage Given: 2 Gy
Session Number: 22

## 2022-08-13 ENCOUNTER — Other Ambulatory Visit: Payer: Self-pay | Admitting: Radiation Therapy

## 2022-08-13 ENCOUNTER — Other Ambulatory Visit: Payer: Self-pay

## 2022-08-13 ENCOUNTER — Encounter: Payer: Self-pay | Admitting: Radiation Oncology

## 2022-08-13 ENCOUNTER — Other Ambulatory Visit: Payer: Self-pay | Admitting: Radiation Oncology

## 2022-08-13 ENCOUNTER — Ambulatory Visit
Admission: RE | Admit: 2022-08-13 | Discharge: 2022-08-13 | Disposition: A | Discharge status: Home / Self Care | Payer: Medicare Other | Source: Ambulatory Visit | Attending: Radiation Oncology | Admitting: Radiation Oncology

## 2022-08-13 VITALS — BP 144/43 | HR 84 | Temp 97.6°F | Resp 22

## 2022-08-13 DIAGNOSIS — B3741 Candidal cystitis and urethritis: Secondary | ICD-10-CM | POA: Insufficient documentation

## 2022-08-13 DIAGNOSIS — Z7984 Long term (current) use of oral hypoglycemic drugs: Secondary | ICD-10-CM | POA: Insufficient documentation

## 2022-08-13 DIAGNOSIS — Z510 Encounter for antineoplastic radiation therapy: Secondary | ICD-10-CM | POA: Diagnosis not present

## 2022-08-13 DIAGNOSIS — C7931 Secondary malignant neoplasm of brain: Secondary | ICD-10-CM | POA: Insufficient documentation

## 2022-08-13 DIAGNOSIS — Z79899 Other long term (current) drug therapy: Secondary | ICD-10-CM | POA: Insufficient documentation

## 2022-08-13 DIAGNOSIS — C3412 Malignant neoplasm of upper lobe, left bronchus or lung: Secondary | ICD-10-CM | POA: Insufficient documentation

## 2022-08-13 LAB — RAD ONC ARIA SESSION SUMMARY
Course Elapsed Days: 34
Plan Fractions Treated to Date: 22
Plan Prescribed Dose Per Fraction: 2 Gy
Plan Total Fractions Prescribed: 30
Plan Total Prescribed Dose: 60 Gy
Reference Point Dosage Given to Date: 44 Gy
Reference Point Session Dosage Given: 2 Gy
Session Number: 23

## 2022-08-13 MED ORDER — FLUCONAZOLE 100 MG PO TABS: ORAL_TABLET | ORAL | 0 refills | Status: DC

## 2022-08-13 NOTE — Progress Notes (Signed)
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        Most recent MRI of the brain on 10/7 demonstrates no evidence for residual tumor at this time. (post op)  Symptomatically, denies  seizures, headaches, nausea, new neurologic deficits, visual changes.     Recent neurologic symptoms, if any:  Seizures: none Headaches:none Nausea: none Dizziness/ataxia: none Difficulty with hand coordination: difficulty with fine motor skills (tiny objects) She has noticed troubles with picking up puzzle pieces and is not as stable on her feet.  She states she uses a walker at night to help with that. 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 She is currently taking liquid Carafate which is helping her with her esophagitis. She also reports some dryness on her left cheek.  She is currently on dexamethasone.    ALLERGIES:  is allergic to amoxicillin, compazine [prochlorperazine], norvasc [amlodipine], and trelegy ellipta [fluticasone-umeclidin-vilant].  Meds: Current Outpatient Medications  Medication Sig Dispense Refill    acetaminophen (TYLENOL) 500 MG tablet Take 500 mg by mouth every 6 (six) hours as needed for moderate pain.     albuterol (PROVENTIL) (2.5 MG/3ML) 0.083% nebulizer solution Take 3 mLs (2.5 mg total) by nebulization every 4 (four) hours as needed for wheezing or shortness of breath.     albuterol (VENTOLIN HFA) 108 (90 Base) MCG/ACT inhaler Inhale 2 puffs into the lungs every 6 (six) hours as needed for wheezing or shortness of breath. 18 g 1   ALPRAZolam (XANAX) 0.25 MG tablet Take 0.25 mg by mouth at bedtime as needed for anxiety.     Budeson-Glycopyrrol-Formoterol (BREZTRI AEROSPHERE) 160-9-4.8 MCG/ACT AERO INHALE 2 PUFFS BY MOUTH EVERY MORNING AND EVERY NIGHT AT BEDTIME 10.7 g 5   Emollient (UDDERLY SMOOTH EXTRA CARE 20 EX) Apply 1 Application topically as needed (dry skin).     fluconazole (DIFLUCAN) 100 MG tablet Take 2 tablets today and then 1 tablet daily for 6 more days. 8 tablet 0   hydrochlorothiazide (HYDRODIURIL) 25 MG tablet Take 25 mg by mouth daily as needed (Leg swelling).     loratadine (CLARITIN) 10 MG tablet Take 10 mg by mouth at bedtime.     LORazepam (ATIVAN) 1 MG tablet Take 1 tablet (1 mg total) by mouth as needed. 30 min Prior to MRI or brain radiation procedure 5 tablet 0   metFORMIN (GLUCOPHAGE-XR) 500 MG 24 hr tablet Take 500 mg by mouth every morning.     pantoprazole (PROTONIX) 20 MG tablet Take 20 mg by mouth daily.     sucralfate (CARAFATE) 1 GM/10ML suspension Take 10 mLs (1 g total) by mouth  4 (four) times daily -  with meals and at bedtime. 420 mL 1   traMADol (ULTRAM) 50 MG tablet Take 2 tablets (100 mg total) by mouth every 6 (six) hours as needed for severe pain. 30 tablet 0   folic acid (FOLVITE) 1 MG tablet Take 1 tablet (1 mg total) by mouth daily. (Patient not taking: Reported on 08/13/2022) 30 tablet 2   levETIRAcetam (KEPPRA) 500 MG tablet Take 1 tablet (500 mg total) by mouth 2 (two) times daily. (Patient not taking: Reported on 08/13/2022) 60 tablet 1    ondansetron (ZOFRAN) 8 MG tablet Take 1 tablet (8 mg total) by mouth every 8 (eight) hours as needed for nausea or vomiting. (Patient not taking: Reported on 08/13/2022) 30 tablet 2   sucralfate (CARAFATE) 1 g tablet Take 1 tablet (1 g total) by mouth 4 (four) times daily -  with meals and at bedtime. Crush and dissolve in 10 mL of warm water prior to swallowing, take 20 minutes prior to meals 120 tablet 1   telmisartan (MICARDIS) 20 MG tablet Take 1 tablet (20 mg total) by mouth at bedtime. (Patient not taking: Reported on 08/13/2022) 90 tablet 1   No current facility-administered medications for this encounter.    Physical Findings: The patient is in no acute distress. Patient is alert and oriented.  oral temperature is 97.6 F (36.4 C). Her blood pressure is 144/43 (abnormal) and her pulse is 84. Her respiration is 22 (abnormal) and oxygen saturation is 98%. .  No significant changes. Physical Exam Constitutional:      Appearance: Normal appearance. She is normal weight.  HENT:     Head: Normocephalic and atraumatic.     Comments: No masses visualized or palpated on exam. New, patchy hair growth on the back of the head noted    Mouth/Throat:     Comments: White exudate present on right cheek Eyes:     Extraocular Movements: Extraocular movements intact.     Pupils: Pupils are equal, round, and reactive to light.  Cardiovascular:     Comments: +1 bilateral edema Skin:    General: Skin is warm and dry.  Neurological:     General: No focal deficit present.     Mental Status: She is alert and oriented to person, place, and time.     Comments: Cranial nerves 2-12 grossly intact  Psychiatric:        Mood and Affect: Mood normal.        Behavior: Behavior normal.        Thought Content: Thought content normal.    Lab Findings: Lab Results  Component Value Date   WBC 9.3 07/12/2022   HGB 11.6 (L) 07/12/2022   HCT 35.7 (L) 07/12/2022   MCV 97.8 07/12/2022   PLT 221 07/12/2022     Radiographic Findings: No results found.  Impression/Plan:  Patient is present with her husband today. She is recovering well from [facility] treatment. She does not exhibit any concerning signs of radiation effects on clinical exam today.   Fluconazole prescribed for one week to help treat thrush.  Patient will follow-up with Dr. Reatha Armour on 11/29 as he would like to see her 6 weeks post [facility] treatment. Next MRI scan is 10/10/2022. Will communicate with Dr. Reatha Armour to see who will follow-up with patient next.   Patient is continuing to receive radiation therapy with Dr. Sondra Come for her lung cancer and is being seen by Dr. Julien Nordmann for chemotherapy.  On date of service,  in total, I spent 20 minutes on this encounter. Patient was seen in person.  _____________________________________   Leona Singleton, PA    Eppie Gibson, MD

## 2022-08-14 ENCOUNTER — Other Ambulatory Visit: Payer: Self-pay

## 2022-08-14 ENCOUNTER — Ambulatory Visit
Admission: RE | Admit: 2022-08-14 | Discharge: 2022-08-14 | Disposition: A | Discharge status: Home / Self Care | Payer: Medicare Other | Source: Ambulatory Visit | Attending: Radiation Oncology | Admitting: Radiation Oncology

## 2022-08-14 DIAGNOSIS — Z510 Encounter for antineoplastic radiation therapy: Secondary | ICD-10-CM | POA: Diagnosis not present

## 2022-08-14 LAB — RAD ONC ARIA SESSION SUMMARY
Course Elapsed Days: 35
Plan Fractions Treated to Date: 23
Plan Prescribed Dose Per Fraction: 2 Gy
Plan Total Fractions Prescribed: 30
Plan Total Prescribed Dose: 60 Gy
Reference Point Dosage Given to Date: 46 Gy
Reference Point Session Dosage Given: 2 Gy
Session Number: 24

## 2022-08-15 ENCOUNTER — Other Ambulatory Visit: Payer: Self-pay

## 2022-08-15 ENCOUNTER — Ambulatory Visit
Admission: RE | Admit: 2022-08-15 | Discharge: 2022-08-15 | Disposition: A | Discharge status: Home / Self Care | Payer: Medicare Other | Source: Ambulatory Visit | Attending: Radiation Oncology | Admitting: Radiation Oncology

## 2022-08-15 DIAGNOSIS — Z510 Encounter for antineoplastic radiation therapy: Secondary | ICD-10-CM | POA: Diagnosis not present

## 2022-08-15 LAB — RAD ONC ARIA SESSION SUMMARY
Course Elapsed Days: 36
Plan Fractions Treated to Date: 24
Plan Prescribed Dose Per Fraction: 2 Gy
Plan Total Fractions Prescribed: 30
Plan Total Prescribed Dose: 60 Gy
Reference Point Dosage Given to Date: 48 Gy
Reference Point Session Dosage Given: 2 Gy
Session Number: 25

## 2022-08-16 ENCOUNTER — Other Ambulatory Visit: Payer: Self-pay

## 2022-08-16 ENCOUNTER — Ambulatory Visit
Admission: RE | Admit: 2022-08-16 | Discharge: 2022-08-16 | Disposition: A | Discharge status: Home / Self Care | Payer: Medicare Other | Source: Ambulatory Visit | Attending: Radiation Oncology | Admitting: Radiation Oncology

## 2022-08-16 DIAGNOSIS — Z510 Encounter for antineoplastic radiation therapy: Secondary | ICD-10-CM | POA: Diagnosis not present

## 2022-08-16 LAB — RAD ONC ARIA SESSION SUMMARY
Course Elapsed Days: 37
Plan Fractions Treated to Date: 25
Plan Prescribed Dose Per Fraction: 2 Gy
Plan Total Fractions Prescribed: 30
Plan Total Prescribed Dose: 60 Gy
Reference Point Dosage Given to Date: 50 Gy
Reference Point Session Dosage Given: 2 Gy
Session Number: 26

## 2022-08-19 ENCOUNTER — Ambulatory Visit
Admission: RE | Admit: 2022-08-19 | Discharge: 2022-08-19 | Disposition: A | Discharge status: Home / Self Care | Payer: Medicare Other | Source: Ambulatory Visit | Attending: Radiation Oncology | Admitting: Radiation Oncology

## 2022-08-19 ENCOUNTER — Other Ambulatory Visit: Payer: Medicare Other

## 2022-08-19 ENCOUNTER — Ambulatory Visit: Payer: Medicare Other | Admitting: Physician Assistant

## 2022-08-19 ENCOUNTER — Ambulatory Visit: Payer: Medicare Other

## 2022-08-19 ENCOUNTER — Other Ambulatory Visit: Payer: Self-pay

## 2022-08-19 DIAGNOSIS — Z510 Encounter for antineoplastic radiation therapy: Secondary | ICD-10-CM | POA: Diagnosis not present

## 2022-08-19 LAB — RAD ONC ARIA SESSION SUMMARY
Course Elapsed Days: 40
Plan Fractions Treated to Date: 26
Plan Prescribed Dose Per Fraction: 2 Gy
Plan Total Fractions Prescribed: 30
Plan Total Prescribed Dose: 60 Gy
Reference Point Dosage Given to Date: 52 Gy
Reference Point Session Dosage Given: 2 Gy
Session Number: 27

## 2022-08-20 ENCOUNTER — Other Ambulatory Visit: Payer: Self-pay | Admitting: Oncology

## 2022-08-20 ENCOUNTER — Ambulatory Visit
Admission: RE | Admit: 2022-08-20 | Discharge: 2022-08-20 | Disposition: A | Discharge status: Home / Self Care | Payer: Medicare Other | Source: Ambulatory Visit | Attending: Radiation Oncology | Admitting: Radiation Oncology

## 2022-08-20 ENCOUNTER — Other Ambulatory Visit: Payer: Self-pay

## 2022-08-20 DIAGNOSIS — Z510 Encounter for antineoplastic radiation therapy: Secondary | ICD-10-CM | POA: Diagnosis not present

## 2022-08-20 LAB — RAD ONC ARIA SESSION SUMMARY
Course Elapsed Days: 41
Plan Fractions Treated to Date: 27
Plan Prescribed Dose Per Fraction: 2 Gy
Plan Total Fractions Prescribed: 30
Plan Total Prescribed Dose: 60 Gy
Reference Point Dosage Given to Date: 54 Gy
Reference Point Session Dosage Given: 2 Gy
Session Number: 28

## 2022-08-20 MED ORDER — SUCRALFATE 1 GM/10ML PO SUSP: 1.0000 g | Freq: Three times a day (TID) | ORAL | 1 refills | Status: DC

## 2022-08-20 NOTE — Progress Notes (Signed)
Refill for Carafate sent to [place] per Dr. Sondra Come.

## 2022-08-21 ENCOUNTER — Ambulatory Visit
Admission: RE | Admit: 2022-08-21 | Discharge: 2022-08-21 | Disposition: A | Discharge status: Home / Self Care | Payer: Medicare Other | Source: Ambulatory Visit | Attending: Radiation Oncology | Admitting: Radiation Oncology

## 2022-08-21 ENCOUNTER — Other Ambulatory Visit: Payer: Self-pay

## 2022-08-21 ENCOUNTER — Ambulatory Visit: Payer: Medicare Other

## 2022-08-21 DIAGNOSIS — Z510 Encounter for antineoplastic radiation therapy: Secondary | ICD-10-CM | POA: Diagnosis not present

## 2022-08-21 LAB — RAD ONC ARIA SESSION SUMMARY
Course Elapsed Days: 42
Plan Fractions Treated to Date: 28
Plan Prescribed Dose Per Fraction: 2 Gy
Plan Total Fractions Prescribed: 30
Plan Total Prescribed Dose: 60 Gy
Reference Point Dosage Given to Date: 56 Gy
Reference Point Session Dosage Given: 2 Gy
Session Number: 29

## 2022-08-22 ENCOUNTER — Other Ambulatory Visit: Payer: Self-pay

## 2022-08-22 ENCOUNTER — Ambulatory Visit
Admission: RE | Admit: 2022-08-22 | Discharge: 2022-08-22 | Disposition: A | Discharge status: Home / Self Care | Payer: Medicare Other | Source: Ambulatory Visit | Attending: Radiation Oncology | Admitting: Radiation Oncology

## 2022-08-22 DIAGNOSIS — Z510 Encounter for antineoplastic radiation therapy: Secondary | ICD-10-CM | POA: Diagnosis not present

## 2022-08-22 LAB — RAD ONC ARIA SESSION SUMMARY
Course Elapsed Days: 43
Plan Fractions Treated to Date: 29
Plan Prescribed Dose Per Fraction: 2 Gy
Plan Total Fractions Prescribed: 30
Plan Total Prescribed Dose: 60 Gy
Reference Point Dosage Given to Date: 58 Gy
Reference Point Session Dosage Given: 2 Gy
Session Number: 30

## 2022-08-23 ENCOUNTER — Other Ambulatory Visit: Payer: Self-pay

## 2022-08-23 ENCOUNTER — Ambulatory Visit
Admission: RE | Admit: 2022-08-23 | Discharge: 2022-08-23 | Disposition: A | Discharge status: Home / Self Care | Payer: Medicare Other | Source: Ambulatory Visit | Attending: Radiation Oncology | Admitting: Radiation Oncology

## 2022-08-23 ENCOUNTER — Encounter: Payer: Self-pay | Admitting: Radiation Oncology

## 2022-08-23 ENCOUNTER — Other Ambulatory Visit: Payer: Self-pay | Admitting: Physician Assistant

## 2022-08-23 DIAGNOSIS — Z510 Encounter for antineoplastic radiation therapy: Secondary | ICD-10-CM | POA: Diagnosis not present

## 2022-08-23 DIAGNOSIS — C7932 Secondary malignant neoplasm of cerebral meninges: Secondary | ICD-10-CM

## 2022-08-23 LAB — RAD ONC ARIA SESSION SUMMARY
Course Elapsed Days: 44
Plan Fractions Treated to Date: 30
Plan Prescribed Dose Per Fraction: 2 Gy
Plan Total Fractions Prescribed: 30
Plan Total Prescribed Dose: 60 Gy
Reference Point Dosage Given to Date: 60 Gy
Reference Point Session Dosage Given: 2 Gy
Session Number: 31

## 2022-08-23 MED ORDER — LIDOCAINE-PRILOCAINE 2.5-2.5 % EX CREA: 1.0000 | TOPICAL_CREAM | CUTANEOUS | 2 refills | Status: DC | PRN

## 2022-08-23 NOTE — Progress Notes (Unsigned)
[facility] OFFICE PROGRESS NOTE  [name], NP [facility] [address] [place] [address]  DIAGNOSIS: Stage IV (T2 a, N3, M1b) non-small cell lung cancer, adenocarcinoma presented with left upper lobe perihilar mass in addition to left hilar and mediastinal lymphadenopathy as well as left supraclavicular lymphadenopathy diagnosed in September 2023.  She also had a brain lesion.    Detected Alteration(s) / Biomarker(s)          Associated FDA-approved therapies  Clinical Trial Availability          % cfDNA or Amplification   KRAS G12C approved by FDA Adagrasib, Sotorasib Yes    1.7%   TP53 R249S None Yes          1.5%  PRIOR THERAPY: 1) SRS the the metastatic brain lesion under the care of Dr. Isidore Moos 2) Craniotomy on 07/10/22 under the care of Dr. Reatha Armour 3) radiation to the chest under care of Dr. Sondra Come.  Last dose on 08/23/2022  CURRENT THERAPY: systemic chemotherapy with carboplatin for an AUC of 5, to 500 mg per metered squared, and Keytruda 200 mg IV every 3 weeks.  First dose expected on 09/03/2022.   INTERVAL HISTORY: Laurie Mccoy 81 y.o. female returns to the clinic today for a follow-up visit accompanied by her husband.  She was last seen by myself and Dr. Julien Nordmann on 07/22/2022.  The patient had previously underwent SRS and craniotomy for the metastatic brain lesion under the care of Dr. Reatha Armour.  Due to the craniotomy, Dr.  recommended waiting 1 month prior to receiving any chemotherapy to allow for adequate time for wound healing.  Therefore, the patient completed radiation to the lung mass under the care of Dr. Sondra Come last week on 08/23/2022.  We are seeing her today to discuss other recommendations regarding chemotherapy.   Since last being seen, the patient is feeling fairly well except for dysphagia and odynophagia which is manageable with carafate. Her weight has stayed stable.  She denies any fever, night sweats, unexplained weight loss. She felt  chilled on Saturday but it went away.  Regarding symptoms of infection, the patient denies any symptoms of infection except she has endorsed increased urinary frequency.  Denies any dysuria.  She has never had a urinary tract infection and is wondering if it be from urinary tract infection.  She reports her breathing is "good".  She is in need of a pulmonologist.  She feels like her breathing is a little more shallow after her radiation treatment but her breathing does improve when she uses her inhaler.  The patient endorses a baseline cough which produces clear phlegm. She denies any hemoptysis or chest pain.  She denies any headache or visual changes.  She has been seeing her PCP for low heart rate as well as her cardiologist.  She has occasional chest tightness for which she takes tramadol; ever, she has not needed to take this as frequently as prior..  She is here today for evaluation and more detailed discussion about her current condition and recommended treatment options.    MEDICAL HISTORY: Past Medical History:  Diagnosis Date   Anxiety    "had panic attacks years ago"   Asthma    COPD (chronic obstructive pulmonary disease) ([facility])    Goiter 2022   History of hiatal hernia    "dx in college, never bothered me"   History of kidney stones 2012   Hypertension    Lung cancer ([facility])  Sleep apnea     ALLERGIES:  is allergic to amoxicillin, compazine [prochlorperazine], norvasc [amlodipine], and trelegy ellipta [fluticasone-umeclidin-vilant].  MEDICATIONS:  Current Outpatient Medications  Medication Sig Dispense Refill   acetaminophen (TYLENOL) 500 MG tablet Take 500 mg by mouth every 6 (six) hours as needed for moderate pain.     albuterol (PROVENTIL) (2.5 MG/3ML) 0.083% nebulizer solution Take 3 mLs (2.5 mg total) by nebulization every 4 (four) hours as needed for wheezing or shortness of breath.     albuterol (VENTOLIN HFA) 108 (90 Base) MCG/ACT inhaler Inhale 2 puffs into the lungs  every 6 (six) hours as needed for wheezing or shortness of breath. 18 g 1   ALPRAZolam (XANAX) 0.25 MG tablet Take 0.25 mg by mouth at bedtime as needed for anxiety.     Budeson-Glycopyrrol-Formoterol (BREZTRI AEROSPHERE) 160-9-4.8 MCG/ACT AERO INHALE 2 PUFFS BY MOUTH EVERY MORNING AND EVERY NIGHT AT BEDTIME 10.7 g 5   Emollient (UDDERLY SMOOTH EXTRA CARE 20 EX) Apply 1 Application topically as needed (dry skin).     fluconazole (DIFLUCAN) 100 MG tablet Take 2 tablets today and then 1 tablet daily for 6 more days. 8 tablet 0   folic acid (FOLVITE) 1 MG tablet Take 1 tablet (1 mg total) by mouth daily. (Patient not taking: Reported on 08/13/2022) 30 tablet 2   hydrochlorothiazide (HYDRODIURIL) 25 MG tablet Take 25 mg by mouth daily as needed (Leg swelling).     levETIRAcetam (KEPPRA) 500 MG tablet Take 1 tablet (500 mg total) by mouth 2 (two) times daily. (Patient not taking: Reported on 08/13/2022) 60 tablet 1   lidocaine-prilocaine (EMLA) cream Apply 1 Application topically as needed. 30 g 2   loratadine (CLARITIN) 10 MG tablet Take 10 mg by mouth at bedtime.     LORazepam (ATIVAN) 1 MG tablet Take 1 tablet (1 mg total) by mouth as needed. 30 min Prior to MRI or brain radiation procedure 5 tablet 0   metFORMIN (GLUCOPHAGE-XR) 500 MG 24 hr tablet Take 500 mg by mouth every morning.     ondansetron (ZOFRAN) 8 MG tablet Take 1 tablet (8 mg total) by mouth every 8 (eight) hours as needed for nausea or vomiting. (Patient not taking: Reported on 08/13/2022) 30 tablet 2   pantoprazole (PROTONIX) 20 MG tablet Take 20 mg by mouth daily.     sucralfate (CARAFATE) 1 GM/10ML suspension Take 10 mLs (1 g total) by mouth 4 (four) times daily -  with meals and at bedtime. 420 mL 1   telmisartan (MICARDIS) 20 MG tablet Take 1 tablet (20 mg total) by mouth at bedtime. (Patient not taking: Reported on 08/13/2022) 90 tablet 1   traMADol (ULTRAM) 50 MG tablet Take 2 tablets (100 mg total) by mouth every 6 (six) hours as  needed for severe pain. 30 tablet 0   No current facility-administered medications for this visit.   Facility-Administered Medications Ordered in Other Visits  Medication Dose Route Frequency Provider Last Rate Last Admin   cyanocobalamin (VITAMIN B12) injection 1,000 mcg  1,000 mcg Intramuscular Once Darran Gabay L, PA-C        SURGICAL HISTORY:  Past Surgical History:  Procedure Laterality Date   APPENDECTOMY     APPLICATION OF CRANIAL NAVIGATION N/A 07/11/2022   Procedure: APPLICATION OF CRANIAL NAVIGATION;  Surgeon: Dawley, Theodoro Doing, DO;  Location: [facility];  Service: Neurosurgery;  Laterality: N/A;   BRONCHIAL BIOPSY  06/12/2021   Procedure: BRONCHIAL BIOPSIES;  Surgeon: Garner Nash, DO;  Location:  [facility];  Service: Pulmonary;;   BRONCHIAL BRUSHINGS  06/12/2021   Procedure: BRONCHIAL BRUSHINGS;  Surgeon: Garner Nash, DO;  Location: [facility];  Service: Pulmonary;;   BRONCHIAL WASHINGS  06/12/2021   Procedure: BRONCHIAL WASHINGS;  Surgeon: Garner Nash, DO;  Location: [facility];  Service: Pulmonary;;   CHOLECYSTECTOMY     COLON RESECTION     CRANIOTOMY N/A 07/11/2022   Procedure: Craniotomy for resection of tumor;  Surgeon: Karsten Ro, DO;  Location: [facility];  Service: Neurosurgery;  Laterality: N/A;  RM 19   FIDUCIAL MARKER PLACEMENT  06/12/2021   Procedure: FIDUCIAL MARKER PLACEMENT;  Surgeon: Garner Nash, DO;  Location: [facility];  Service: Pulmonary;;   hernia     x 2   TONSILLECTOMY     VIDEO BRONCHOSCOPY WITH ENDOBRONCHIAL NAVIGATION Bilateral 06/12/2021   Procedure: VIDEO BRONCHOSCOPY WITH ENDOBRONCHIAL NAVIGATION;  Surgeon: Garner Nash, DO;  Location: [facility];  Service: Pulmonary;  Laterality: Bilateral;  ION   VIDEO BRONCHOSCOPY WITH RADIAL ENDOBRONCHIAL ULTRASOUND  06/12/2021   Procedure: RADIAL ENDOBRONCHIAL ULTRASOUND;  Surgeon: Garner Nash, DO;  Location: [facility];  Service: Pulmonary;;    REVIEW OF  SYSTEMS:   Review of Systems  Constitutional: Still for fatigue.  Negative for appetite change, chills, fever and unexpected weight change.  HENT: Positive for mild odynophagia and dysphagia.  Negative for mouth sores, nosebleeds, and trouble swallowing.   Eyes: Negative for eye problems and icterus.  Respiratory: Positive for baseline dyspnea on exertion and cough.  Negative for hemoptysis and wheezing.   Cardiovascular: Negative for chest pain and leg swelling.  Gastrointestinal: Negative for abdominal pain, constipation, diarrhea, nausea and vomiting.  Genitourinary: Positive for increased urinary frequency.  Negative for bladder incontinence, difficulty urinating, dysuria, and hematuria.   Musculoskeletal: Negative for back pain, gait problem, neck pain and neck stiffness.  Skin: Negative for itching and rash.  Neurological: Negative for dizziness, extremity weakness, gait problem, headaches, light-headedness and seizures.  Hematological: Negative for adenopathy. Does not bruise/bleed easily.  Psychiatric/Behavioral: Negative for confusion, depression and sleep disturbance. The patient is not nervous/anxious.     PHYSICAL EXAMINATION:  Blood pressure (!) 139/56, pulse 92, temperature 98 F (36.7 C), temperature source Oral, resp. rate 17, weight 202 lb 6.4 oz (91.8 kg), SpO2 96 %.  ECOG PERFORMANCE STATUS: 1  Physical Exam  Constitutional: Oriented to person, place, and time and well-developed, well-nourished, and in no distress.  HENT:  Head: Normocephalic and atraumatic.  Mouth/Throat: Oropharynx is clear and moist. No oropharyngeal exudate.  Eyes: Conjunctivae are normal. Right eye exhibits no discharge. Left eye exhibits no discharge. No scleral icterus.  Neck: Normal range of motion. Neck supple.  Cardiovascular: Normal rate, regular rhythm, normal heart sounds and intact distal pulses.   Pulmonary/Chest: Effort normal and breath sounds normal. No respiratory distress. No  wheezes. No rales.  Abdominal: Soft. Bowel sounds are normal. Exhibits no distension and no mass. There is no tenderness.  Musculoskeletal: Normal range of motion. Exhibits no edema.  Lymphadenopathy:    No cervical adenopathy.  Neurological: Alert and oriented to person, place, and time. Exhibits normal muscle tone.  Examined in the wheelchair. Skin: Skin is warm and dry. No rash noted. Not diaphoretic. No erythema. No pallor.  Psychiatric: Mood, memory and judgment normal.  Vitals reviewed.  LABORATORY DATA: Lab Results  Component Value Date   WBC 4.3 08/27/2022   HGB 12.4 08/27/2022   HCT 37.5 08/27/2022   MCV  96.9 08/27/2022   PLT 211 08/27/2022      Chemistry      Component Value Date/Time   NA 139 08/27/2022 0739   NA 139 09/17/2019 1550   K 3.8 08/27/2022 0739   CL 105 08/27/2022 0739   CO2 26 08/27/2022 0739   BUN 21 08/27/2022 0739   BUN 9 09/17/2019 1550   CREATININE 0.55 08/27/2022 0739      Component Value Date/Time   CALCIUM 8.8 (L) 08/27/2022 0739   ALKPHOS 51 08/27/2022 0739   AST 8 (L) 08/27/2022 0739   ALT 14 08/27/2022 0739   BILITOT 0.4 08/27/2022 0739       RADIOGRAPHIC STUDIES:  No results found.   ASSESSMENT/PLAN:  This is a very pleasant 81 years old Caucasian female with likely stage IV (T2a, N3, M1B) non-small cell lung cancer, adenocarcinoma presented with left upper lobe perihilar mass in addition to left hilar and mediastinal lymphadenopathy as well as left supraclavicular lymphadenopathy and a solitary brain metastasis diagnosed in September 2023. The molecular studies showed positive KRAS G12C mutation. Discussed this can be used in the second line setting in the future.    The patient underwent SRS and craniotomy under the care of Dr. Isidore Moos and Dr. Reatha Armour on 07/10/22. Dr. Reatha Armour does not recommend any chemotherapy for at least 1 month from her surgery due to wound healing.    She is recently completed radiation to the chest under the  care of Dr. Sondra Come. The last day of radiation was scheduled for 08/23/22  The patient was seen with Dr. Julien Nordmann. Dr. Julien Nordmann had a lengthy discussion with the patient about her current condition and recommended treatment options.  Dr. Julien Nordmann recommends arranging for systemic chemotherapy with carboplatin for an AUC of 5, Alimta 500 mg per metered square, Keytruda 200 mg IV every 3 weeks.   The adverse side effects of treatment were discussed including but not limited to fatigue, myelosuppression, nausea, vomiting, kidney, liver dysfunction.  We also I discussed with her the adverse effect of the immunotherapy including but not limited to immunotherapy mediated skin rash, diarrhea, inflammation of the lung, kidney, liver, thyroid or other endocrine dysfunction  We will start her first cycle of treatment around 09/02/2022.  I will arrange for a 1 week follow-up visit approximately 2 weeks from now for a 1 week follow-up visit and to manage any adverse side effects of treatment.   We will arrange for B12 injection to be administered.  Discussed that she should begin taking her folic acid supplement.   I previously have sent her prescription for Zofran at her prior appointment.  We will refer her to a pulmonary practice in [place] to establish care for her COPD.  We will arrange for the patient to have a UA and culture performed today to rule out UTI.  We will scan her after 2 cycles of treatment.   The patient was advised to call immediately if she has any concerning symptoms in the interval. The patient voices understanding of current disease status and treatment options and is in agreement with the current care plan. All questions were answered. The patient knows to call the clinic with any problems, questions or concerns. We can certainly see the patient much sooner if necessary         Orders Placed This Encounter  Procedures   Urine Culture    Standing Status:   Future     Standing Expiration Date:   08/27/2023   Vitamin  B12    Standing Status:   Future    Standing Expiration Date:   08/28/2023   CBC with Differential (Cancer Center Only)    Standing Status:   Future    Standing Expiration Date:   09/03/2023   CMP ([facility] only)    Standing Status:   Future    Standing Expiration Date:   09/03/2023   T4    Standing Status:   Future    Standing Expiration Date:   09/03/2023   TSH    Standing Status:   Future    Standing Expiration Date:   09/03/2023   CBC with Differential (Cancer Center Only)    Standing Status:   Future    Standing Expiration Date:   09/24/2023   CMP ([facility] only)    Standing Status:   Future    Standing Expiration Date:   09/24/2023   CBC with Differential ([facility] Only)    Standing Status:   Future    Standing Expiration Date:   10/15/2023   CMP ([facility] only)    Standing Status:   Future    Standing Expiration Date:   10/15/2023   T4    Standing Status:   Future    Standing Expiration Date:   10/15/2023   TSH    Standing Status:   Future    Standing Expiration Date:   10/15/2023   CBC with Differential ([facility] Only)    Standing Status:   Future    Standing Expiration Date:   11/05/2023   CMP ([facility] only)    Standing Status:   Future    Standing Expiration Date:   11/05/2023   CBC with Differential ([facility] Only)    Standing Status:   Standing    Number of Occurrences:   12    Standing Expiration Date:   08/28/2023   CMP ([facility] only)    Standing Status:   Standing    Number of Occurrences:   12    Standing Expiration Date:   08/28/2023   Urinalysis, Complete w Microscopic    Standing Status:   Future    Standing Expiration Date:   08/28/2023      Tobe Sos Ashaun Gaughan, PA-C 08/27/22  ADDENDUM: Hematology/Oncology Attending: I had a face-to-face encounter with the patient today.  I reviewed her records, lab and recommended her care plan.  This is a very  pleasant 81 years old white female diagnosed with a stage IV (T2 a, N3, M1b) non-small cell lung cancer, adenocarcinoma with positive KRAS G12C mutation presented with left upper lobe perihilar mass in addition to left hilar and mediastinal lymphadenopathy, left supraclavicular lymphadenopathy and solitary brain metastasis diagnosed and September 2023.  The patient is status post SRS followed by craniotomy and resection of the solitary brain metastasis.  She also underwent a course of palliative radiotherapy to the chest under the care of Dr. Sondra Come completed on August 23, 2022. The patient is here today for evaluation and discussion of her treatment options.  I had a lengthy discussion with the patient and her husband today about her current condition and treatment options. I recommended for the patient systemic chemotherapy with carboplatin for AUC of 5, Alimta 500 Mg/M2 and Keytruda 200 Mg IV every 3 weeks.  She was also given the option of palliative care but she is interested in treatment. I discussed with the patient the adverse effect of this treatment including but not limited to alopecia,  myelosuppression, nausea and vomiting, peripheral neuropathy, liver or renal dysfunction as well as immunotherapy adverse effects. The patient is expected to start the first cycle of this treatment next week. She will receive vitamin B12 injection today and the patient will have prescription for folic acid and Compazine sent to her pharmacy. She will come back for follow-up visit in 2 weeks for evaluation and management of any adverse effect of her treatment. The patient was advised to call immediately if she has any other concerning symptoms in the interval. The total time spent in the appointment was 40 minutes. Disclaimer: This note was dictated with voice recognition software. Similar sounding words can inadvertently be transcribed and may be missed upon review. Eilleen Kempf, MD

## 2022-08-23 NOTE — Telephone Encounter (Signed)
This nurse returned call related to patients phone message about questions concerning her chemo,  the schedule and having her port placed.  This nurse advised that she has an office visit on Tuesday and those questions can be addressed in person if she would like.  Reminded her that it is also available to her in print on the AVS that was provided to her in the last visit.  Informed that order for port placement has been placed and they will reach out to schedule however, it may be after the first chemo however it is not needed in order to receive her infusion.  No further questions or concerns noted at this time. Patient does know to call if more questions or concerns do arise.

## 2022-08-26 ENCOUNTER — Other Ambulatory Visit: Payer: Medicare Other

## 2022-08-26 ENCOUNTER — Ambulatory Visit: Payer: Medicare Other

## 2022-08-26 ENCOUNTER — Other Ambulatory Visit: Payer: Self-pay | Admitting: Medical Oncology

## 2022-08-26 DIAGNOSIS — C7932 Secondary malignant neoplasm of cerebral meninges: Secondary | ICD-10-CM

## 2022-08-27 ENCOUNTER — Inpatient Hospital Stay: Payer: Medicare Other

## 2022-08-27 ENCOUNTER — Other Ambulatory Visit: Payer: Self-pay | Admitting: Physician Assistant

## 2022-08-27 ENCOUNTER — Other Ambulatory Visit: Payer: Self-pay

## 2022-08-27 ENCOUNTER — Inpatient Hospital Stay: Payer: Medicare Other | Attending: Internal Medicine | Admitting: Physician Assistant

## 2022-08-27 ENCOUNTER — Telehealth: Payer: Self-pay

## 2022-08-27 ENCOUNTER — Telehealth: Payer: Self-pay | Admitting: Internal Medicine

## 2022-08-27 VITALS — BP 139/56 | HR 92 | Temp 98.0°F | Resp 17 | Wt 202.4 lb

## 2022-08-27 DIAGNOSIS — R300 Dysuria: Secondary | ICD-10-CM

## 2022-08-27 DIAGNOSIS — C7932 Secondary malignant neoplasm of cerebral meninges: Secondary | ICD-10-CM | POA: Diagnosis not present

## 2022-08-27 DIAGNOSIS — G939 Disorder of brain, unspecified: Secondary | ICD-10-CM | POA: Diagnosis not present

## 2022-08-27 DIAGNOSIS — Z888 Allergy status to other drugs, medicaments and biological substances status: Secondary | ICD-10-CM | POA: Diagnosis not present

## 2022-08-27 DIAGNOSIS — Z87442 Personal history of urinary calculi: Secondary | ICD-10-CM | POA: Insufficient documentation

## 2022-08-27 DIAGNOSIS — Z7189 Other specified counseling: Secondary | ICD-10-CM

## 2022-08-27 DIAGNOSIS — Z923 Personal history of irradiation: Secondary | ICD-10-CM | POA: Diagnosis not present

## 2022-08-27 DIAGNOSIS — N390 Urinary tract infection, site not specified: Secondary | ICD-10-CM

## 2022-08-27 DIAGNOSIS — C3490 Malignant neoplasm of unspecified part of unspecified bronchus or lung: Secondary | ICD-10-CM

## 2022-08-27 DIAGNOSIS — R001 Bradycardia, unspecified: Secondary | ICD-10-CM | POA: Insufficient documentation

## 2022-08-27 DIAGNOSIS — C3412 Malignant neoplasm of upper lobe, left bronchus or lung: Secondary | ICD-10-CM | POA: Diagnosis present

## 2022-08-27 DIAGNOSIS — J441 Chronic obstructive pulmonary disease with (acute) exacerbation: Secondary | ICD-10-CM | POA: Insufficient documentation

## 2022-08-27 DIAGNOSIS — Z9049 Acquired absence of other specified parts of digestive tract: Secondary | ICD-10-CM | POA: Insufficient documentation

## 2022-08-27 DIAGNOSIS — Z880 Allergy status to penicillin: Secondary | ICD-10-CM | POA: Diagnosis not present

## 2022-08-27 DIAGNOSIS — Z79899 Other long term (current) drug therapy: Secondary | ICD-10-CM | POA: Diagnosis not present

## 2022-08-27 DIAGNOSIS — C7931 Secondary malignant neoplasm of brain: Secondary | ICD-10-CM | POA: Insufficient documentation

## 2022-08-27 DIAGNOSIS — R5383 Other fatigue: Secondary | ICD-10-CM | POA: Insufficient documentation

## 2022-08-27 DIAGNOSIS — R1310 Dysphagia, unspecified: Secondary | ICD-10-CM | POA: Diagnosis not present

## 2022-08-27 DIAGNOSIS — E538 Deficiency of other specified B group vitamins: Secondary | ICD-10-CM | POA: Diagnosis not present

## 2022-08-27 LAB — CMP (CANCER CENTER ONLY)
ALT: 14 U/L (ref 0–44)
AST: 8 U/L — ABNORMAL LOW (ref 15–41)
Albumin: 3.7 g/dL (ref 3.5–5.0)
Alkaline Phosphatase: 51 U/L (ref 38–126)
Anion gap: 8 (ref 5–15)
BUN: 21 mg/dL (ref 8–23)
CO2: 26 mmol/L (ref 22–32)
Calcium: 8.8 mg/dL — ABNORMAL LOW (ref 8.9–10.3)
Chloride: 105 mmol/L (ref 98–111)
Creatinine: 0.55 mg/dL (ref 0.44–1.00)
GFR, Estimated: >60 mL/min (ref >60)
Glucose, Bld: 132 mg/dL — ABNORMAL HIGH (ref 70–99)
Potassium: 3.8 mmol/L (ref 3.5–5.1)
Sodium: 139 mmol/L (ref 135–145)
Total Bilirubin: 0.4 mg/dL (ref 0.3–1.2)
Total Protein: 6.1 g/dL — ABNORMAL LOW (ref 6.5–8.1)

## 2022-08-27 LAB — CBC WITH DIFFERENTIAL (CANCER CENTER ONLY)
Abs Immature Granulocytes: 0.04 10*3/uL (ref 0.00–0.07)
Basophils Absolute: 0 10*3/uL (ref 0.0–0.1)
Basophils Relative: 1 %
Eosinophils Absolute: 0.1 10*3/uL (ref 0.0–0.5)
Eosinophils Relative: 2 %
HCT: 37.5 % (ref 36.0–46.0)
Hemoglobin: 12.4 g/dL (ref 12.0–15.0)
Immature Granulocytes: 1 %
Lymphocytes Relative: 8 %
Lymphs Abs: 0.4 10*3/uL — ABNORMAL LOW (ref 0.7–4.0)
MCH: 32 pg (ref 26.0–34.0)
MCHC: 33.1 g/dL (ref 30.0–36.0)
MCV: 96.9 fL (ref 80.0–100.0)
Monocytes Absolute: 0.3 10*3/uL (ref 0.1–1.0)
Monocytes Relative: 6 %
Neutro Abs: 3.6 10*3/uL (ref 1.7–7.7)
Neutrophils Relative %: 82 %
Platelet Count: 211 10*3/uL (ref 150–400)
RBC: 3.87 MIL/uL (ref 3.87–5.11)
RDW: 13.4 % (ref 11.5–15.5)
WBC Count: 4.3 10*3/uL (ref 4.0–10.5)
nRBC: 0 % (ref 0.0–0.2)

## 2022-08-27 LAB — URINALYSIS, COMPLETE (UACMP) WITH MICROSCOPIC
Bilirubin Urine: NEGATIVE
Glucose, UA: NEGATIVE mg/dL
Hgb urine dipstick: NEGATIVE
Ketones, ur: 5 mg/dL — AB
Nitrite: NEGATIVE
Protein, ur: NEGATIVE mg/dL
Specific Gravity, Urine: 1.027 (ref 1.005–1.030)
pH: 5 (ref 5.0–8.0)

## 2022-08-27 MED ORDER — NITROFURANTOIN MONOHYD MACRO 100 MG PO CAPS: 100.0000 mg | ORAL_CAPSULE | Freq: Two times a day (BID) | ORAL | 0 refills | Status: DC

## 2022-08-27 MED ORDER — CYANOCOBALAMIN 1000 MCG/ML IJ SOLN
1000.0000 ug | Freq: Once | INTRAMUSCULAR | Status: AC
  Administered 2022-08-27: 1000 ug via INTRAMUSCULAR
  Filled 2022-08-27: qty 1

## 2022-08-27 NOTE — Telephone Encounter (Signed)
Called patient and she states that she is needing her discharge paperwork from our office. She states she was told by a nurse that Dr Lamonte Sakai suggest for her to find a new practice due to the issues with finding her a new pulmonologist. I told her that I would message Dr Lamonte Sakai and see what I can do for her. She states on the letter that she is going to need the reason for the discharge from our practice.  Please advise sir

## 2022-08-27 NOTE — Telephone Encounter (Signed)
This nurse reached out to patient and made her aware that her urine results came back positive.  The provider did send them for a culture.  She has called in [name] for her to start taking now.   Patient acknowledged understanding.  No further questions or concerns noted.

## 2022-08-27 NOTE — Progress Notes (Signed)
DISCONTINUE ON PATHWAY REGIMEN - Non-Small Cell Lung     A cycle is every 7 days, concurrent with RT:     Paclitaxel      Carboplatin   **Always confirm dose/schedule in your pharmacy ordering system**  REASON: Other Reason PRIOR TREATMENT: [id number]: Carboplatin AUC=2 + Paclitaxel 45 mg/m2 Weekly During Radiation TREATMENT RESPONSE: Unable to Evaluate  START ON PATHWAY REGIMEN - Non-Small Cell Lung     A cycle is every 21 days:     Pembrolizumab      Pemetrexed      Carboplatin   **Always confirm dose/schedule in your pharmacy ordering system**  Patient Characteristics: Stage IV Metastatic, Nonsquamous, Molecular Analysis Completed, Molecular Alteration Present and Targeted Therapy Exhausted OR EGFR Exon 20+ or KRAS G12C+ or HER2+ Present and No Prior Chemo/Immunotherapy OR No Alteration Present, Initial  Chemotherapy/Immunotherapy, PS = 0, 1, BRAF/MET/KRAS/HER2 Mutation Positive, Candidate for Immunotherapy, PD-L1 Expression Positive 1-49% (TPS) / Negative / Not Tested / Awaiting Test Results and Immunotherapy Candidate Therapeutic Status: Stage IV Metastatic Histology: Nonsquamous Cell Broad Molecular Profiling Status: Molecular Analysis Completed Molecular Analysis Results: KRAS G12C Mutation Present and No Prior Chemo/Immunotherapy ECOG Performance Status: 1 Chemotherapy/Immunotherapy Line of Therapy: Initial Chemotherapy/Immunotherapy Immunotherapy Candidate Status: Candidate for Immunotherapy PD-L1 Expression Status: Quantity Not Sufficient Intent of Therapy: Non-Curative / Palliative Intent, Discussed with Patient

## 2022-08-27 NOTE — Patient Instructions (Addendum)

## 2022-08-28 ENCOUNTER — Other Ambulatory Visit: Payer: Self-pay

## 2022-08-28 DIAGNOSIS — J449 Chronic obstructive pulmonary disease, unspecified: Secondary | ICD-10-CM

## 2022-08-28 NOTE — Telephone Encounter (Signed)
Attempted to call pt but unable to reach. Left message for her to return call. 

## 2022-08-28 NOTE — Telephone Encounter (Signed)
I'm not sure why this has been routed to me. I have never seen the patient, nor have I discharged her from the practice. I am not going to see her as an internal transfer of care, because it is against our practice policies. I have recommended that she continue be seen in consultation outside our practice if she does not want to continue with her current provider. She should make this decision with Dr Melvyn Novas.

## 2022-08-29 ENCOUNTER — Other Ambulatory Visit: Payer: Self-pay

## 2022-08-29 LAB — URINE CULTURE: Culture: 80000 — AB

## 2022-08-30 MED FILL — Dexamethasone Sodium Phosphate Inj 100 MG/10ML: INTRAMUSCULAR | Qty: 1 | Status: AC

## 2022-08-30 MED FILL — Fosaprepitant Dimeglumine For IV Infusion 150 MG (Base Eq): INTRAVENOUS | Qty: 5 | Status: AC

## 2022-08-30 NOTE — Telephone Encounter (Signed)
Per notes from oncology, she has been referred to [facility] pulmonology. Will close this encounter.

## 2022-08-31 ENCOUNTER — Inpatient Hospital Stay: Payer: Medicare Other

## 2022-09-02 ENCOUNTER — Emergency Department (HOSPITAL_COMMUNITY): Payer: Medicare Other

## 2022-09-02 ENCOUNTER — Other Ambulatory Visit: Payer: Self-pay

## 2022-09-02 ENCOUNTER — Inpatient Hospital Stay: Payer: Medicare Other

## 2022-09-02 ENCOUNTER — Other Ambulatory Visit: Payer: Medicare Other

## 2022-09-02 ENCOUNTER — Inpatient Hospital Stay (HOSPITAL_COMMUNITY)
Admission: EM | Admit: 2022-09-02 | Discharge: 2022-09-09 | DRG: 871 | Disposition: A | Discharge status: Home Health | Payer: Medicare Other | Attending: Internal Medicine | Admitting: Internal Medicine

## 2022-09-02 ENCOUNTER — Inpatient Hospital Stay (HOSPITAL_BASED_OUTPATIENT_CLINIC_OR_DEPARTMENT_OTHER): Payer: Medicare Other | Admitting: Physician Assistant

## 2022-09-02 VITALS — BP 162/69 | HR 98 | Temp 100.7°F | Resp 20 | Wt 203.0 lb

## 2022-09-02 DIAGNOSIS — J440 Chronic obstructive pulmonary disease with acute lower respiratory infection: Secondary | ICD-10-CM | POA: Diagnosis present

## 2022-09-02 DIAGNOSIS — Z881 Allergy status to other antibiotic agents status: Secondary | ICD-10-CM | POA: Diagnosis not present

## 2022-09-02 DIAGNOSIS — F419 Anxiety disorder, unspecified: Secondary | ICD-10-CM | POA: Insufficient documentation

## 2022-09-02 DIAGNOSIS — Z7984 Long term (current) use of oral hypoglycemic drugs: Secondary | ICD-10-CM | POA: Diagnosis not present

## 2022-09-02 DIAGNOSIS — Z79891 Long term (current) use of opiate analgesic: Secondary | ICD-10-CM | POA: Diagnosis not present

## 2022-09-02 DIAGNOSIS — J189 Pneumonia, unspecified organism: Secondary | ICD-10-CM | POA: Diagnosis present

## 2022-09-02 DIAGNOSIS — A419 Sepsis, unspecified organism: Principal | ICD-10-CM | POA: Insufficient documentation

## 2022-09-02 DIAGNOSIS — E785 Hyperlipidemia, unspecified: Secondary | ICD-10-CM | POA: Insufficient documentation

## 2022-09-02 DIAGNOSIS — Z87891 Personal history of nicotine dependence: Secondary | ICD-10-CM | POA: Diagnosis not present

## 2022-09-02 DIAGNOSIS — Z808 Family history of malignant neoplasm of other organs or systems: Secondary | ICD-10-CM

## 2022-09-02 DIAGNOSIS — Z9221 Personal history of antineoplastic chemotherapy: Secondary | ICD-10-CM

## 2022-09-02 DIAGNOSIS — D630 Anemia in neoplastic disease: Secondary | ICD-10-CM | POA: Diagnosis present

## 2022-09-02 DIAGNOSIS — J9601 Acute respiratory failure with hypoxia: Secondary | ICD-10-CM | POA: Diagnosis present

## 2022-09-02 DIAGNOSIS — G4730 Sleep apnea, unspecified: Secondary | ICD-10-CM | POA: Diagnosis present

## 2022-09-02 DIAGNOSIS — Z85841 Personal history of malignant neoplasm of brain: Secondary | ICD-10-CM

## 2022-09-02 DIAGNOSIS — C3490 Malignant neoplasm of unspecified part of unspecified bronchus or lung: Secondary | ICD-10-CM

## 2022-09-02 DIAGNOSIS — D72810 Lymphocytopenia: Secondary | ICD-10-CM | POA: Diagnosis present

## 2022-09-02 DIAGNOSIS — Z1152 Encounter for screening for COVID-19: Secondary | ICD-10-CM | POA: Diagnosis not present

## 2022-09-02 DIAGNOSIS — Z9049 Acquired absence of other specified parts of digestive tract: Secondary | ICD-10-CM

## 2022-09-02 DIAGNOSIS — Z79899 Other long term (current) drug therapy: Secondary | ICD-10-CM

## 2022-09-02 DIAGNOSIS — I10 Essential (primary) hypertension: Secondary | ICD-10-CM | POA: Diagnosis present

## 2022-09-02 DIAGNOSIS — E119 Type 2 diabetes mellitus without complications: Secondary | ICD-10-CM | POA: Diagnosis present

## 2022-09-02 DIAGNOSIS — J441 Chronic obstructive pulmonary disease with (acute) exacerbation: Secondary | ICD-10-CM | POA: Diagnosis not present

## 2022-09-02 DIAGNOSIS — R0602 Shortness of breath: Secondary | ICD-10-CM

## 2022-09-02 DIAGNOSIS — C7932 Secondary malignant neoplasm of cerebral meninges: Secondary | ICD-10-CM | POA: Diagnosis present

## 2022-09-02 DIAGNOSIS — J9600 Acute respiratory failure, unspecified whether with hypoxia or hypercapnia: Secondary | ICD-10-CM | POA: Diagnosis not present

## 2022-09-02 DIAGNOSIS — R6520 Severe sepsis without septic shock: Secondary | ICD-10-CM | POA: Diagnosis not present

## 2022-09-02 DIAGNOSIS — C3412 Malignant neoplasm of upper lobe, left bronchus or lung: Secondary | ICD-10-CM | POA: Diagnosis present

## 2022-09-02 DIAGNOSIS — R509 Fever, unspecified: Secondary | ICD-10-CM | POA: Diagnosis not present

## 2022-09-02 DIAGNOSIS — E876 Hypokalemia: Secondary | ICD-10-CM | POA: Diagnosis present

## 2022-09-02 DIAGNOSIS — Z7951 Long term (current) use of inhaled steroids: Secondary | ICD-10-CM | POA: Diagnosis not present

## 2022-09-02 DIAGNOSIS — G4733 Obstructive sleep apnea (adult) (pediatric): Secondary | ICD-10-CM | POA: Diagnosis present

## 2022-09-02 DIAGNOSIS — K449 Diaphragmatic hernia without obstruction or gangrene: Secondary | ICD-10-CM | POA: Diagnosis present

## 2022-09-02 DIAGNOSIS — I2510 Atherosclerotic heart disease of native coronary artery without angina pectoris: Secondary | ICD-10-CM | POA: Insufficient documentation

## 2022-09-02 DIAGNOSIS — Z888 Allergy status to other drugs, medicaments and biological substances status: Secondary | ICD-10-CM

## 2022-09-02 DIAGNOSIS — Z801 Family history of malignant neoplasm of trachea, bronchus and lung: Secondary | ICD-10-CM

## 2022-09-02 DIAGNOSIS — Z923 Personal history of irradiation: Secondary | ICD-10-CM

## 2022-09-02 LAB — URINALYSIS, ROUTINE W REFLEX MICROSCOPIC
Bacteria, UA: NONE SEEN
Bilirubin Urine: NEGATIVE
Glucose, UA: NEGATIVE mg/dL
Ketones, ur: NEGATIVE mg/dL
Leukocytes,Ua: NEGATIVE
Nitrite: NEGATIVE
Protein, ur: NEGATIVE mg/dL
RBC / HPF: >50 RBC/hpf — ABNORMAL HIGH (ref 0–5)
Specific Gravity, Urine: 1.024 (ref 1.005–1.030)
pH: 6 (ref 5.0–8.0)

## 2022-09-02 LAB — APTT: aPTT: 32 seconds (ref 24–36)

## 2022-09-02 LAB — CMP (CANCER CENTER ONLY)
ALT: 19 U/L (ref 0–44)
AST: 11 U/L — ABNORMAL LOW (ref 15–41)
Albumin: 3.4 g/dL — ABNORMAL LOW (ref 3.5–5.0)
Alkaline Phosphatase: 60 U/L (ref 38–126)
Anion gap: 7 (ref 5–15)
BUN: 13 mg/dL (ref 8–23)
CO2: 25 mmol/L (ref 22–32)
Calcium: 8.7 mg/dL — ABNORMAL LOW (ref 8.9–10.3)
Chloride: 105 mmol/L (ref 98–111)
Creatinine: 0.49 mg/dL (ref 0.44–1.00)
GFR, Estimated: >60 mL/min (ref >60)
Glucose, Bld: 128 mg/dL — ABNORMAL HIGH (ref 70–99)
Potassium: 3.7 mmol/L (ref 3.5–5.1)
Sodium: 137 mmol/L (ref 135–145)
Total Bilirubin: 0.5 mg/dL (ref 0.3–1.2)
Total Protein: 6 g/dL — ABNORMAL LOW (ref 6.5–8.1)

## 2022-09-02 LAB — CBC WITH DIFFERENTIAL/PLATELET
Abs Immature Granulocytes: 0.03 10*3/uL (ref 0.00–0.07)
Basophils Absolute: 0 10*3/uL (ref 0.0–0.1)
Basophils Relative: 1 %
Eosinophils Absolute: 0.1 10*3/uL (ref 0.0–0.5)
Eosinophils Relative: 4 %
HCT: 36.8 % (ref 36.0–46.0)
Hemoglobin: 12 g/dL (ref 12.0–15.0)
Immature Granulocytes: 1 %
Lymphocytes Relative: 9 %
Lymphs Abs: 0.4 10*3/uL — ABNORMAL LOW (ref 0.7–4.0)
MCH: 31.6 pg (ref 26.0–34.0)
MCHC: 32.6 g/dL (ref 30.0–36.0)
MCV: 96.8 fL (ref 80.0–100.0)
Monocytes Absolute: 0.2 10*3/uL (ref 0.1–1.0)
Monocytes Relative: 6 %
Neutro Abs: 3.1 10*3/uL (ref 1.7–7.7)
Neutrophils Relative %: 79 %
Platelets: 221 10*3/uL (ref 150–400)
RBC: 3.8 MIL/uL — ABNORMAL LOW (ref 3.87–5.11)
RDW: 13.5 % (ref 11.5–15.5)
WBC: 3.9 10*3/uL — ABNORMAL LOW (ref 4.0–10.5)
nRBC: 0 % (ref 0.0–0.2)

## 2022-09-02 LAB — COMPREHENSIVE METABOLIC PANEL
ALT: 23 U/L (ref 0–44)
AST: 16 U/L (ref 15–41)
Albumin: 3.2 g/dL — ABNORMAL LOW (ref 3.5–5.0)
Alkaline Phosphatase: 61 U/L (ref 38–126)
Anion gap: 10 (ref 5–15)
BUN: 12 mg/dL (ref 8–23)
CO2: 24 mmol/L (ref 22–32)
Calcium: 8.5 mg/dL — ABNORMAL LOW (ref 8.9–10.3)
Chloride: 104 mmol/L (ref 98–111)
Creatinine, Ser: 0.51 mg/dL (ref 0.44–1.00)
GFR, Estimated: >60 mL/min (ref >60)
Glucose, Bld: 122 mg/dL — ABNORMAL HIGH (ref 70–99)
Potassium: 3.9 mmol/L (ref 3.5–5.1)
Sodium: 138 mmol/L (ref 135–145)
Total Bilirubin: 0.7 mg/dL (ref 0.3–1.2)
Total Protein: 6.6 g/dL (ref 6.5–8.1)

## 2022-09-02 LAB — RESP PANEL BY RT-PCR (FLU A&B, COVID) ARPGX2
Influenza A by PCR: NEGATIVE
Influenza B by PCR: NEGATIVE
SARS Coronavirus 2 by RT PCR: NEGATIVE

## 2022-09-02 LAB — CBC WITH DIFFERENTIAL (CANCER CENTER ONLY)
Abs Immature Granulocytes: 0.02 10*3/uL (ref 0.00–0.07)
Basophils Absolute: 0 10*3/uL (ref 0.0–0.1)
Basophils Relative: 1 %
Eosinophils Absolute: 0.1 10*3/uL (ref 0.0–0.5)
Eosinophils Relative: 3 %
HCT: 33 % — ABNORMAL LOW (ref 36.0–46.0)
Hemoglobin: 11.4 g/dL — ABNORMAL LOW (ref 12.0–15.0)
Immature Granulocytes: 1 %
Lymphocytes Relative: 9 %
Lymphs Abs: 0.3 10*3/uL — ABNORMAL LOW (ref 0.7–4.0)
MCH: 32.5 pg (ref 26.0–34.0)
MCHC: 34.5 g/dL (ref 30.0–36.0)
MCV: 94 fL (ref 80.0–100.0)
Monocytes Absolute: 0.3 10*3/uL (ref 0.1–1.0)
Monocytes Relative: 7 %
Neutro Abs: 3 10*3/uL (ref 1.7–7.7)
Neutrophils Relative %: 79 %
Platelet Count: 199 10*3/uL (ref 150–400)
RBC: 3.51 MIL/uL — ABNORMAL LOW (ref 3.87–5.11)
RDW: 13.5 % (ref 11.5–15.5)
WBC Count: 3.8 10*3/uL — ABNORMAL LOW (ref 4.0–10.5)
nRBC: 0 % (ref 0.0–0.2)

## 2022-09-02 LAB — PROTIME-INR
INR: 1.1 (ref 0.8–1.2)
Prothrombin Time: 14.4 seconds (ref 11.4–15.2)

## 2022-09-02 LAB — MAGNESIUM: Magnesium: 1.9 mg/dL (ref 1.7–2.4)

## 2022-09-02 LAB — TSH: TSH: 1.009 u[IU]/mL (ref 0.350–4.500)

## 2022-09-02 LAB — BRAIN NATRIURETIC PEPTIDE
B Natriuretic Peptide: 58 pg/mL (ref 0.0–100.0)
B Natriuretic Peptide: 50.3 pg/mL (ref 0.0–100.0)

## 2022-09-02 LAB — LACTIC ACID, PLASMA: Lactic Acid, Venous: 1.5 mmol/L (ref 0.5–1.9)

## 2022-09-02 MED ORDER — IPRATROPIUM-ALBUTEROL 0.5-2.5 (3) MG/3ML IN SOLN: 3.0000 mL | Freq: Four times a day (QID) | RESPIRATORY_TRACT | Status: DC

## 2022-09-02 MED ORDER — SODIUM CHLORIDE 0.9 % IV SOLN: Freq: Once | INTRAVENOUS | Status: AC

## 2022-09-02 MED ORDER — SODIUM CHLORIDE 0.9 % IV SOLN
10.0000 mg | Freq: Once | INTRAVENOUS | Status: DC
  Filled 2022-09-02: qty 1

## 2022-09-02 MED ORDER — SODIUM CHLORIDE 0.9 % IV SOLN: 200.0000 mg | Freq: Once | INTRAVENOUS | Status: DC

## 2022-09-02 MED ORDER — LORAZEPAM 2 MG/ML IJ SOLN
0.5000 mg | Freq: Once | INTRAMUSCULAR | Status: AC
  Administered 2022-09-02: 0.5 mg via INTRAVENOUS
  Filled 2022-09-02: qty 1

## 2022-09-02 MED ORDER — PALONOSETRON HCL INJECTION 0.25 MG/5ML: 0.2500 mg | Freq: Once | INTRAVENOUS | Status: DC

## 2022-09-02 MED ORDER — METRONIDAZOLE 500 MG/100ML IV SOLN
500.0000 mg | Freq: Once | INTRAVENOUS | Status: AC
  Administered 2022-09-02: 500 mg via INTRAVENOUS
  Filled 2022-09-02: qty 100

## 2022-09-02 MED ORDER — ONDANSETRON HCL 4 MG PO TABS: 4.0000 mg | ORAL_TABLET | Freq: Four times a day (QID) | ORAL | Status: DC | PRN

## 2022-09-02 MED ORDER — SODIUM CHLORIDE 0.9 % IV SOLN
150.0000 mg | Freq: Once | INTRAVENOUS | Status: DC
  Filled 2022-09-02: qty 5

## 2022-09-02 MED ORDER — VANCOMYCIN HCL 1250 MG/250ML IV SOLN
1250.0000 mg | Freq: Every day | INTRAVENOUS | Status: DC
  Administered 2022-09-03 – 2022-09-04 (×2): 1250 mg via INTRAVENOUS
  Filled 2022-09-02 (×4): qty 250

## 2022-09-02 MED ORDER — ONDANSETRON HCL 4 MG/2ML IJ SOLN: 4.0000 mg | Freq: Four times a day (QID) | INTRAMUSCULAR | Status: DC | PRN

## 2022-09-02 MED ORDER — LACTATED RINGERS IV SOLN
INTRAVENOUS | Status: AC
  Administered 2022-09-02: 999 mL via INTRAVENOUS

## 2022-09-02 MED ORDER — SODIUM CHLORIDE 0.9 % IV SOLN: 16.0000 mg | Freq: Once | INTRAVENOUS | Status: DC

## 2022-09-02 MED ORDER — SODIUM CHLORIDE 0.9 % IV SOLN: 500.0000 mg/m2 | Freq: Once | INTRAVENOUS | Status: DC

## 2022-09-02 MED ORDER — VANCOMYCIN HCL IN DEXTROSE 1-5 GM/200ML-% IV SOLN
1000.0000 mg | Freq: Once | INTRAVENOUS | Status: AC
  Administered 2022-09-02: 1000 mg via INTRAVENOUS
  Filled 2022-09-02: qty 200

## 2022-09-02 MED ORDER — ENOXAPARIN SODIUM 40 MG/0.4ML IJ SOSY
40.0000 mg | PREFILLED_SYRINGE | INTRAMUSCULAR | Status: DC
  Administered 2022-09-04 – 2022-09-09 (×6): 40 mg via SUBCUTANEOUS
  Filled 2022-09-02 (×7): qty 0.4

## 2022-09-02 MED ORDER — SODIUM CHLORIDE 0.9 % IV SOLN
2.0000 g | Freq: Three times a day (TID) | INTRAVENOUS | Status: DC
  Administered 2022-09-02 – 2022-09-07 (×14): 2 g via INTRAVENOUS
  Filled 2022-09-02 (×14): qty 12.5

## 2022-09-02 MED ORDER — SODIUM CHLORIDE 0.9 % IV SOLN
2.0000 g | Freq: Once | INTRAVENOUS | Status: AC
  Administered 2022-09-02: 2 g via INTRAVENOUS
  Filled 2022-09-02: qty 12.5

## 2022-09-02 MED ORDER — IPRATROPIUM-ALBUTEROL 0.5-2.5 (3) MG/3ML IN SOLN
3.0000 mL | Freq: Four times a day (QID) | RESPIRATORY_TRACT | Status: DC
  Administered 2022-09-02 – 2022-09-03 (×3): 3 mL via RESPIRATORY_TRACT
  Filled 2022-09-02 (×3): qty 3

## 2022-09-02 MED ORDER — ACETAMINOPHEN 650 MG RE SUPP: 650.0000 mg | Freq: Four times a day (QID) | RECTAL | Status: DC | PRN

## 2022-09-02 MED ORDER — GUAIFENESIN ER 600 MG PO TB12
600.0000 mg | ORAL_TABLET | Freq: Two times a day (BID) | ORAL | Status: DC
  Administered 2022-09-02 – 2022-09-09 (×13): 600 mg via ORAL
  Filled 2022-09-02 (×14): qty 1

## 2022-09-02 MED ORDER — LACTATED RINGERS IV BOLUS (SEPSIS)
500.0000 mL | Freq: Once | INTRAVENOUS | Status: AC
  Administered 2022-09-02: 500 mL via INTRAVENOUS

## 2022-09-02 MED ORDER — SODIUM CHLORIDE 0.9 % IV SOLN: 455.5000 mg | Freq: Once | INTRAVENOUS | Status: DC

## 2022-09-02 MED ORDER — ACETAMINOPHEN 325 MG PO TABS
650.0000 mg | ORAL_TABLET | Freq: Four times a day (QID) | ORAL | Status: DC | PRN
  Administered 2022-09-03 (×2): 650 mg via ORAL
  Filled 2022-09-02 (×2): qty 2

## 2022-09-02 NOTE — Progress Notes (Signed)
  Radiation Oncology         [phone] ________________________________  Name: Laurie Mccoy MRN: [id number]  Date: 07/10/2022  DOB: 1941/04/08  End of Treatment Note  Diagnosis:   brain metastases     Indication for treatment:  palliative        Site/dose:   Brain metastases/ 16-20 Pearline Cables in 1 fraction  Beams/energy:   Stereotactic radiosurgery using VMAT IMRT 6 MV FFF photons  Narrative: The patient tolerated radiation treatment relatively well.      Plan: The patient has completed radiation treatment. The patient will return to radiation oncology clinic for routine followup in one month. I advised them to call or return sooner if they have any questions or concerns related to their recovery or treatment.  -----------------------------------  Eppie Gibson, MD

## 2022-09-02 NOTE — ED Triage Notes (Signed)
Pt in from the cancer center with c/o chills, shortness of breath, coughing, and fever. Pt also has some pitting edema in her lower extremities as well as chest tightness. Pt was there today to receive chemo, her first treatment. Pt is alert and oriented per her baseline, husband at bedside at this time.

## 2022-09-02 NOTE — Progress Notes (Signed)
[facility] OFFICE PROGRESS NOTE  [name], NP [facility] [address] [place] [id number]  DIAGNOSIS: Stage IV (T2 a, N3, M1b) non-small cell lung cancer, adenocarcinoma presented with left upper lobe perihilar mass in addition to left hilar and mediastinal lymphadenopathy as well as left supraclavicular lymphadenopathy diagnosed in September 2023.  She also had a brain lesion.    Detected Alteration(s) / Biomarker(s)          Associated FDA-approved therapies  Clinical Trial Availability          % cfDNA or Amplification   KRAS G12C approved by FDA Adagrasib, Sotorasib Yes    1.7%   TP53 R249S None Yes          1.5%  PRIOR THERAPY:  1) SRS the the metastatic brain lesion under the care of Dr. Isidore Moos 2) Craniotomy on 07/10/22 under the care of Dr. Reatha Armour 3) radiation to the chest under care of Dr. Sondra Come.  Last dose on 08/23/2022  CURRENT THERAPY:  Systemic chemotherapy with carboplatin for an AUC of 5, to 500 mg per metered squared, and Keytruda 200 mg IV every 3 weeks.  First dose expected on 09/02/2022.    INTERVAL HISTORY: [name] 81 y.o. female returns after concerning findings of fever in infusion.  She was supposed to undergo her first cycle of systemic chemotherapy today.  Her temperature was noted to be 100.7.  She was unaware of any other fevers.  Today, she voices concerns of worsening shortness of breath, extreme fatigue, generalized weakness for roughly the past 5 days. She reports her legs are typically swollen (R>L), but the swelling has worsened recently over the last few days. She denies history of heart failure. She has pitting edema. She states the swelling was present when getting out of bed this morning, which is unusual for her. She also reports leg weakness. She denies leg pain or erythema. She typically takes HCTZ for swelling but has not the last few days due to concerns with having to use the restroom. She reports due to her weakness, it  is challenging to get up and down to the rest room. She reports trying a breathing treatment about 5-6 am this morning and again around noon. She does have COPD and she is currently in the process of establishing care with a new pulmonary practice.  She was recently discharged from her prior pulmonary practice.  They called her today to set up an appointment but she has not had a opportunity to call them back.  She reports chills the last 3 days and is having chills in the clinic. She mentions worsening cough and shortness of breath. She states she has been "panting" to breath and breathing shallow.  He denies any sick contacts.  She is scheduled for a port placement Friday and is concerned this will delay her port placement. Last week, she was treated with macrobid for UTI after reporting increased urinary frequency without dysuria or malodorous urine.   MEDICAL HISTORY: Past Medical History:  Diagnosis Date   Anxiety    "had panic attacks years ago"   Asthma    COPD (chronic obstructive pulmonary disease) ([facility])    Goiter 2022   History of hiatal hernia    "dx in college, never bothered me"   History of kidney stones 2012   Hypertension    Lung cancer ([facility])    Sleep apnea     ALLERGIES:  is allergic to amoxicillin, compazine [  prochlorperazine], norvasc [amlodipine], and trelegy ellipta [fluticasone-umeclidin-vilant].  MEDICATIONS:  Current Outpatient Medications  Medication Sig Dispense Refill   acetaminophen (TYLENOL) 500 MG tablet Take 500 mg by mouth every 6 (six) hours as needed for moderate pain.     albuterol (PROVENTIL) (2.5 MG/3ML) 0.083% nebulizer solution Take 3 mLs (2.5 mg total) by nebulization every 4 (four) hours as needed for wheezing or shortness of breath.     albuterol (VENTOLIN HFA) 108 (90 Base) MCG/ACT inhaler Inhale 2 puffs into the lungs every 6 (six) hours as needed for wheezing or shortness of breath. 18 g 1   ALPRAZolam (XANAX) 0.25 MG tablet Take 0.25 mg by mouth  at bedtime as needed for anxiety.     Budeson-Glycopyrrol-Formoterol (BREZTRI AEROSPHERE) 160-9-4.8 MCG/ACT AERO INHALE 2 PUFFS BY MOUTH EVERY MORNING AND EVERY NIGHT AT BEDTIME 10.7 g 5   Emollient (UDDERLY SMOOTH EXTRA CARE 20 EX) Apply 1 Application topically as needed (dry skin).     fluconazole (DIFLUCAN) 100 MG tablet Take 2 tablets today and then 1 tablet daily for 6 more days. 8 tablet 0   folic acid (FOLVITE) 1 MG tablet Take 1 tablet (1 mg total) by mouth daily. (Patient not taking: Reported on 08/13/2022) 30 tablet 2   hydrochlorothiazide (HYDRODIURIL) 25 MG tablet Take 25 mg by mouth daily as needed (Leg swelling).     levETIRAcetam (KEPPRA) 500 MG tablet Take 1 tablet (500 mg total) by mouth 2 (two) times daily. (Patient not taking: Reported on 08/13/2022) 60 tablet 1   lidocaine-prilocaine (EMLA) cream Apply 1 Application topically as needed. 30 g 2   loratadine (CLARITIN) 10 MG tablet Take 10 mg by mouth at bedtime.     LORazepam (ATIVAN) 1 MG tablet Take 1 tablet (1 mg total) by mouth as needed. 30 min Prior to MRI or brain radiation procedure 5 tablet 0   metFORMIN (GLUCOPHAGE-XR) 500 MG 24 hr tablet Take 500 mg by mouth every morning.     nitrofurantoin, macrocrystal-monohydrate, (MACROBID) 100 MG capsule Take 1 capsule (100 mg total) by mouth 2 (two) times daily. 10 capsule 0   ondansetron (ZOFRAN) 8 MG tablet Take 1 tablet (8 mg total) by mouth every 8 (eight) hours as needed for nausea or vomiting. (Patient not taking: Reported on 08/13/2022) 30 tablet 2   pantoprazole (PROTONIX) 20 MG tablet Take 20 mg by mouth daily.     sucralfate (CARAFATE) 1 GM/10ML suspension Take 10 mLs (1 g total) by mouth 4 (four) times daily -  with meals and at bedtime. 420 mL 1   telmisartan (MICARDIS) 20 MG tablet Take 1 tablet (20 mg total) by mouth at bedtime. (Patient not taking: Reported on 08/13/2022) 90 tablet 1   traMADol (ULTRAM) 50 MG tablet Take 2 tablets (100 mg total) by mouth every 6 (six)  hours as needed for severe pain. 30 tablet 0   No current facility-administered medications for this visit.    SURGICAL HISTORY:  Past Surgical History:  Procedure Laterality Date   APPENDECTOMY     APPLICATION OF CRANIAL NAVIGATION N/A 07/11/2022   Procedure: APPLICATION OF CRANIAL NAVIGATION;  Surgeon: Dawley, Theodoro Doing, DO;  Location: [facility];  Service: Neurosurgery;  Laterality: N/A;   BRONCHIAL BIOPSY  06/12/2021   Procedure: BRONCHIAL BIOPSIES;  Surgeon: Garner Nash, DO;  Location: [facility];  Service: Pulmonary;;   BRONCHIAL BRUSHINGS  06/12/2021   Procedure: BRONCHIAL BRUSHINGS;  Surgeon: Garner Nash, DO;  Location: [facility];  Service: Pulmonary;;  BRONCHIAL WASHINGS  06/12/2021   Procedure: BRONCHIAL WASHINGS;  Surgeon: Garner Nash, DO;  Location: [facility];  Service: Pulmonary;;   CHOLECYSTECTOMY     COLON RESECTION     CRANIOTOMY N/A 07/11/2022   Procedure: Craniotomy for resection of tumor;  Surgeon: Dawley, Theodoro Doing, DO;  Location: [facility];  Service: Neurosurgery;  Laterality: N/A;  RM 19   FIDUCIAL MARKER PLACEMENT  06/12/2021   Procedure: FIDUCIAL MARKER PLACEMENT;  Surgeon: Garner Nash, DO;  Location: [facility];  Service: Pulmonary;;   hernia     x 2   TONSILLECTOMY     VIDEO BRONCHOSCOPY WITH ENDOBRONCHIAL NAVIGATION Bilateral 06/12/2021   Procedure: VIDEO BRONCHOSCOPY WITH ENDOBRONCHIAL NAVIGATION;  Surgeon: Garner Nash, DO;  Location: [facility];  Service: Pulmonary;  Laterality: Bilateral;  ION   VIDEO BRONCHOSCOPY WITH RADIAL ENDOBRONCHIAL ULTRASOUND  06/12/2021   Procedure: RADIAL ENDOBRONCHIAL ULTRASOUND;  Surgeon: Garner Nash, DO;  Location: [facility];  Service: Pulmonary;;    REVIEW OF SYSTEMS:   Review of Systems  Constitutional: Positive for fevers, fatigue, generalized weakness, chills.  Negative for appetite change and unexpected weight change.  HENT: Negative for mouth sores, nosebleeds, sore throat and trouble  swallowing.   Eyes: Negative for eye problems and icterus.  Respiratory: Positive for increased shortness of breath and cough.  Negative for hemoptysis and wheezing.   Cardiovascular: Negative for chest pain. Positive for lower extremity swelling R>L.  Gastrointestinal: Negative for abdominal pain, constipation, diarrhea, nausea and vomiting.  Genitourinary: Positive for urinary frequency. Negative for bladder incontinence, difficulty urinating, dysuria, and hematuria.   Musculoskeletal: Negative for back pain, gait problem, neck pain and neck stiffness.  Skin: Negative for itching and rash.  Neurological: Negative for dizziness, extremity weakness, gait problem, headaches, light-headedness and seizures.  Hematological: Negative for adenopathy. Does not bruise/bleed easily.  Psychiatric/Behavioral: Negative for confusion, depression and sleep disturbance. The patient is not nervous/anxious.     PHYSICAL EXAMINATION:  There were no vitals taken for this visit.  ECOG PERFORMANCE STATUS: 2  Physical Exam  Constitutional: Oriented to person, place, and time. and in no distress.Patient having chills.  HENT:  Head: Normocephalic and atraumatic.  Mouth/Throat: Oropharynx is clear and moist. No oropharyngeal exudate.  Eyes: Conjunctivae are normal. Right eye exhibits no discharge. Left eye exhibits no discharge. No scleral icterus.  Neck: Normal range of motion. Neck supple.  Cardiovascular: Normal rate, regular rhythm, normal heart sounds and intact distal pulses.   Pulmonary/Chest: Effort normal. Positive for tachypnea and shallow breaths. Quiet breath sounds bilaterally. No respiratory distress. No wheezes. No rales.  Abdominal: Soft. Bowel sounds are normal. Exhibits no distension and no mass. There is no tenderness.  Musculoskeletal: Normal range of motion.  Bilateral lower extremity pitting edema (R>L) Lymphadenopathy:    No cervical adenopathy.  Neurological: Alert and oriented to  person, place, and time. Exhibits muscle wasting. Examined in the chair.  Skin: Skin is warm and dry. No rash noted. Not diaphoretic. No erythema. No pallor.  Psychiatric: Mood, memory and judgment normal.  Vitals reviewed.  LABORATORY DATA: Lab Results  Component Value Date   WBC 3.8 (L) 09/02/2022   HGB 11.4 (L) 09/02/2022   HCT 33.0 (L) 09/02/2022   MCV 94.0 09/02/2022   PLT 199 09/02/2022      Chemistry      Component Value Date/Time   NA 137 09/02/2022 1243   NA 139 09/17/2019 1550   K 3.7 09/02/2022 1243   CL  105 09/02/2022 1243   CO2 25 09/02/2022 1243   BUN 13 09/02/2022 1243   BUN 9 09/17/2019 1550   CREATININE 0.49 09/02/2022 1243      Component Value Date/Time   CALCIUM 8.7 (L) 09/02/2022 1243   ALKPHOS 60 09/02/2022 1243   AST 11 (L) 09/02/2022 1243   ALT 19 09/02/2022 1243   BILITOT 0.5 09/02/2022 1243       RADIOGRAPHIC STUDIES:  No results found.   ASSESSMENT/PLAN:  This is a very pleasant 81 years old Caucasian female with likely stage IV (T2a, N3, M1B) non-small cell lung cancer, adenocarcinoma presented with left upper lobe perihilar mass in addition to left hilar and mediastinal lymphadenopathy as well as left supraclavicular lymphadenopathy and a solitary brain metastasis diagnosed in September 2023. The molecular studies showed positive KRAS G12C mutation. Discussed this can be used in the second line setting in the future.    The patient underwent SRS and craniotomy under the care of Dr. Isidore Moos and Dr. Reatha Armour on 07/10/22. Dr. Reatha Armour does not recommend any chemotherapy for at least 1 month from her surgery due to wound healing.    She is recently completed radiation to the chest under the care of Dr. Sondra Come. The last day of radiation was scheduled for 08/23/22  The plan is to undergo systemic chemotherapy with carboplatin for an AUC of 5, Alimta 500 mg/m, Keytruda 200 mg IV every 3 weeks.  She was supposed undergo cycle #1 today was found to have  a fever in the infusion room.  The patient was also endorsing several days of fever, chills, generalized weakness in her legs bilaterally, lower extremity swelling, cough, and increased shortness of breath.  She was being treated last week for urinary tract infection which the culture and sensitivity showed was sensitive to Macrobid.  Patient mentions that she is "panting" for breath.  Her oxygen is 96% on room air today.  Due to the patient's symptoms which could range from infection, COPD exacerbation, fluid overload, PE, etc. recommend that the patient be evaluated in the emergency room for more prompt and comprehensive evaluation we did order a BMP today which was normal at 58.   Rest with the patient that in the setting of a fever that I would not feel comfortable giving her her first cycle of chemotherapy today which can suppress her immune system and cause infection which can cause high morbidity mortality in patients undergoing chemotherapy.  We will arrange for the patient's infusion to be rescheduled to next week when she is feeling better and is able to be evaluated for her condition.   The patient was advised to call immediately if she has any concerning symptoms in the interval. The patient voices understanding of current disease status and treatment options and is in agreement with the current care plan. All questions were answered. The patient knows to call the clinic with any problems, questions or concerns. We can certainly see the patient much sooner if necessary       Orders Placed This Encounter  Procedures   DG Chest 2 View    Standing Status:   Future    Standing Expiration Date:   09/02/2023    Order Specific Question:   Reason for Exam (SYMPTOM  OR DIAGNOSIS REQUIRED)    Answer:   Lung cancer, increased shortness of breath. Has COPD. Increased swelling. Assess for infection vs fluid overload, etc.    Order Specific Question:   Preferred imaging location?  Answer:    [facility]   BNP (Brain natriuretic peptide)    Standing Status:   Future    Number of Occurrences:   1    Standing Expiration Date:   09/02/2023     The total time spent in the appointment was 30-39 minutes.   Cher Egnor L Brileigh Sevcik, PA-C 09/02/22

## 2022-09-02 NOTE — ED Provider Notes (Signed)
[facility] DEPT Provider Note   CSN: [id number] Arrival date & time: 09/02/22  1538     History  Chief Complaint  Patient presents with   Fever    Laurie Mccoy is a 81 y.o. 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   Patient presents with chills, fatigue, shortness of breath, cough, and fever.  Patient was at the cancer center to receive her first chemotherapy treatment today. Has already received several weeks of radiation.  Cough is nonproductive, and denies any hemoptysis.  She states she did not know she had fevers until she was at the cancer center today.  Patient endorses chest tightness especially with coughing but she states that she always has pain in her chest and this is not any different than her normal discomfort.  She denies any lightheadedness, palpitations, nausea vomiting abdominal pain diarrhea constipation, dysuria/hematuria patient states she is currently taking an antibiotic for UTI w/ urinary frequency, but does not know what it's called.  Patient also endorses bilateral symmetric lower extremity edema that is pitting and not abnormal for her, she states her legs do swell up sometimes.  She has not had any asymmetric edema, erythema, or calf tenderness.  Per chart review, patient endorsed greater than normal fatigue, shortness of breath.  Spiked a fever to 100.7 F after just IV fluids have been started and she was brought to the emergency department.    Fever      Home Medications Prior to Admission medications   Medication Sig Start Date End Date Taking? Authorizing Provider  acetaminophen (TYLENOL) 500 MG tablet Take 500 mg by mouth every 6 (six) hours as needed for moderate pain.    [provider]  albuterol (PROVENTIL) (2.5 MG/3ML) 0.083% nebulizer solution Take 3 mLs (2.5 mg total) by nebulization every 4 (four) hours as needed for  wheezing or shortness of breath. 04/13/19   Tanda Rockers, MD  albuterol (VENTOLIN HFA) 108 (90 Base) MCG/ACT inhaler Inhale 2 puffs into the lungs every 6 (six) hours as needed for wheezing or shortness of breath. 10/18/19   Tanda Rockers, MD  ALPRAZolam Duanne Moron) 0.25 MG tablet Take 0.25 mg by mouth at bedtime as needed for anxiety.    [provider]  Budeson-Glycopyrrol-Formoterol (BREZTRI AEROSPHERE) 160-9-4.8 MCG/ACT AERO INHALE 2 PUFFS BY MOUTH EVERY MORNING AND EVERY NIGHT AT BEDTIME 05/09/22   Tanda Rockers, MD  Emollient (UDDERLY SMOOTH EXTRA CARE 20 EX) Apply 1 Application topically as needed (dry skin).    [provider]  fluconazole (DIFLUCAN) 100 MG tablet Take 2 tablets today and then 1 tablet daily for 6 more days. 08/13/22   Marlynn Perking, PA-C  folic acid (FOLVITE) 1 MG tablet Take 1 tablet (1 mg total) by mouth daily. Patient not taking: Reported on 08/13/2022 07/22/22   Heilingoetter, Cassandra L, PA-C  hydrochlorothiazide (HYDRODIURIL) 25 MG tablet Take 25 mg by mouth daily as needed (Leg swelling).    [provider]  levETIRAcetam (KEPPRA) 500 MG tablet Take 1 tablet (500 mg total) by mouth 2 (two) times daily. Patient not taking: Reported on 08/13/2022 07/13/22   Dawley, Troy C, DO  lidocaine-prilocaine (EMLA) cream Apply 1 Application topically as needed. 08/23/22   Heilingoetter, Cassandra L, PA-C  loratadine (CLARITIN) 10 MG tablet Take 10 mg by mouth at bedtime.    [provider]  LORazepam (ATIVAN) 1 MG tablet Take 1  tablet (1 mg total) by mouth as needed. 30 min Prior to MRI or brain radiation procedure 07/02/22   Gery Pray, MD  metFORMIN (GLUCOPHAGE-XR) 500 MG 24 hr tablet Take 500 mg by mouth every morning. 01/07/22   [provider]  nitrofurantoin, macrocrystal-monohydrate, (MACROBID) 100 MG capsule Take 1 capsule (100 mg total) by mouth 2 (two) times daily. 08/27/22   Heilingoetter, Cassandra L, PA-C  ondansetron (ZOFRAN) 8  MG tablet Take 1 tablet (8 mg total) by mouth every 8 (eight) hours as needed for nausea or vomiting. Patient not taking: Reported on 08/13/2022 07/22/22   Heilingoetter, Cassandra L, PA-C  pantoprazole (PROTONIX) 20 MG tablet Take 20 mg by mouth daily. 07/19/22   [provider]  sucralfate (CARAFATE) 1 GM/10ML suspension Take 10 mLs (1 g total) by mouth 4 (four) times daily -  with meals and at bedtime. 08/20/22   Gery Pray, MD  telmisartan (MICARDIS) 20 MG tablet Take 1 tablet (20 mg total) by mouth at bedtime. Patient not taking: Reported on 08/13/2022 04/03/22   Geralynn Rile, MD  traMADol (ULTRAM) 50 MG tablet Take 2 tablets (100 mg total) by mouth every 6 (six) hours as needed for severe pain. 07/13/22   Dawley, Troy C, DO      Allergies    Amoxicillin, Compazine [prochlorperazine], Norvasc [amlodipine], and Trelegy ellipta [fluticasone-umeclidin-vilant]    Review of Systems   Review of Systems  Constitutional:  Positive for fever.   Review of systems positive for shortness of breath, cough.  A 10 point review of systems was performed and is negative unless otherwise reported in HPI.  Physical Exam Updated Vital Signs BP (!) 179/103   Pulse (!) 104   Temp 99.1 F (37.3 C)   Resp (!) 26   SpO2 91%  Physical Exam General: Normal appearing female, lying in bed.  HEENT: Sclera anicteric, MMM, trachea midline. Cardiology: Tachycardic rate, regular rhythm, no murmurs/rubs/gallops. BL radial and DP pulses equal bilaterally.  Resp: Normal respiratory effort, mildly increased rate. Decreased breath sounds in bilateral lung bases. No wheezes, rhonchi, crackles.  Abd: Soft, non-tender, non-distended. No rebound tenderness or guarding.  MSK: 3+ symmetric bilateral peripheral edema up to knees. No erythema, calf tenderness, or TTP. Homan's sign negative. Extremities without deformity or TTP. No cyanosis or clubbing. Skin: warm, dry. Circular area of erythema on back likely  d/t patient's radiation treatments.  Neuro: A&Ox4, CNs II-XII grossly intact. MAEs. Sensation grossly intact.  Psych: Normal mood and affect.   ED Results / Procedures / Treatments   Labs (all labs ordered are listed, but only abnormal results are displayed) Labs Reviewed  CBC WITH DIFFERENTIAL/PLATELET - Abnormal; Notable for the following components:      Result Value   WBC 3.9 (*)    RBC 3.80 (*)    Lymphs Abs 0.4 (*)    All other components within normal limits  COMPREHENSIVE METABOLIC PANEL - Abnormal; Notable for the following components:   Glucose, Bld 122 (*)    Calcium 8.5 (*)    Albumin 3.2 (*)    All other components within normal limits  CULTURE, BLOOD (ROUTINE X 2)  CULTURE, BLOOD (ROUTINE X 2)  URINE CULTURE  RESP PANEL BY RT-PCR (FLU A&B, COVID) ARPGX2  MAGNESIUM  LACTIC ACID, PLASMA  PROTIME-INR  APTT  URINALYSIS, ROUTINE W REFLEX MICROSCOPIC  BRAIN NATRIURETIC PEPTIDE  LACTIC ACID, PLASMA    EKG EKG Interpretation  Date/Time:  Monday September 02 2022 16:18:32 EST Ventricular  Rate:  107 PR Interval:  134 QRS Duration: 102 QT Interval:  329 QTC Calculation: 439 R Axis:   229 Text Interpretation: Sinus tachycardia Markedly posterior QRS axis Abnormal T, consider ischemia, lateral leads Confirmed by Cindee Lame [phone]) on 09/02/2022 4:51:42 PM  Radiology DG Chest Portable 1 View  Result Date: 09/02/2022 CLINICAL DATA:  Patient with non-small cell lung cancer with chills, shortness of breath, coughing, and fever EXAM: PORTABLE CHEST 1 VIEW COMPARISON:  CT chest dated 07/04/2022, chest radiograph dated 02/13/2022 FINDINGS: Normal lung volumes. Hazy left mid lung opacity in keeping with known malignancy. Dense left retrocardiac opacity. Metallic fiducial projects over the right mid lung. No pleural effusion or pneumothorax. Enlarged cardiomediastinal silhouette is likely projectional. The visualized skeletal structures are unremarkable. IMPRESSION: 1. Hazy  left mid lung opacity in keeping with known malignancy. 2. Dense left retrocardiac opacity, which may represent atelectasis or pneumonia. Electronically Signed   By: Darrin Nipper M.D.   On: 09/02/2022 17:02    Procedures .Critical Care  Performed by: Audley Hose, MD Authorized by: Audley Hose, MD   Critical care provider statement:    Critical care time (minutes):  45   Critical care was necessary to treat or prevent imminent or life-threatening deterioration of the following conditions:  Sepsis   Critical care was time spent personally by me on the following activities:  Development of treatment plan with patient or surrogate, discussions with consultants, evaluation of patient's response to treatment, examination of patient, ordering and review of laboratory studies, ordering and review of radiographic studies, ordering and performing treatments and interventions, pulse oximetry, re-evaluation of patient's condition, review of old charts and obtaining history from patient or surrogate   Care discussed with: admitting provider       Medications Ordered in ED Medications  lactated ringers infusion (999 mLs Intravenous New Bag/Given 09/02/22 1620)  vancomycin (VANCOCIN) IVPB 1000 mg/200 mL premix (has no administration in time range)  ceFEPIme (MAXIPIME) 2 g in sodium chloride 0.9 % 100 mL IVPB (2 g Intravenous New Bag/Given 09/02/22 1705)  metroNIDAZOLE (FLAGYL) IVPB 500 mg (500 mg Intravenous New Bag/Given 09/02/22 1703)  lactated ringers bolus 500 mL (500 mLs Intravenous New Bag/Given 09/02/22 1619)    ED Course/ Medical Decision Making/ A&P                          Medical Decision Making Amount and/or Complexity of Data Reviewed Labs: ordered. Decision-making details documented in ED Course. Radiology: ordered. Decision-making details documented in ED Course.  Risk Prescription drug management. Decision regarding hospitalization.    Patient w/ T 99.15F, tachypnea into  20s breaths/min, tachycardia into 100s-110s bpm, and hypertension. She is satting 90-91% on RA.   DDX for dyspnea includes but is not limited to: Cardiac- CHF, Myocardial Ischemia, Valvular heart disease, Arrhythmia - EKG w/o evidence of ischemia Respiratory - Pneumonia - w/ SOB/cough/fever this is greatest concern, pulmonary effusion / pulm edema - consider HF exacerbation given BL LE pitting edema, Pneumothorax, COPD - does have history but no wheezing on exam, PE - has SOB and is an active cancer patient but no CP greater than her normal and only symmetric LE pitting edema. If no other etiology of her SOB is identified will likely perform CT.  Other - c/f sepsis in this patient with tachycardia, tachypnea, fever, and will obtain blood cultures and initiate early broad spectrum antibiotics; Anemia    Labs demonstrate WBC 3.9,  lactate 1.5, normal renal function, no anemia, normal coags, glucose 122.  Blood cultures drawn.  EKG without evidence of ischemia, chest x-ray demonstrates left lower lobe pneumonia concern.  Patient is started on vanc cefepime and Flagyl and consulted to hospitalist for admission for concern for sepsis with possible respiratory source..  Tachycardia is improving with total of 1 L IVF.  Not requiring any oxygen at this time  I have personally reviewed and interpreted all labs and imaging.  Patient was maintained on a cardiac monitor.  I have personally interpreted the telemetry as ST.  Clinical Course as of 09/02/22 1729  Mon Sep 02, 2022  1651 WBC(!): 3.9 [HN]  1651 NEUT#: 3.1 No neutropenia [HN]  1651 Hemoglobin: 12.0 [HN]  1716 DG Chest Portable 1 View 1. Hazy left mid lung opacity in keeping with known malignancy. 2. Dense left retrocardiac opacity, which may represent atelectasis or pneumonia.   [HN]  1725 Lactic Acid, Venous: 1.5 [HN]  1726 Consulted to hospitalist for c/f sepsis w/ possible respiratory source [HN]    Clinical Course User Index [HN] Audley Hose, MD   Dispo: Admit        Final Clinical Impression(s) / ED Diagnoses Final diagnoses:  Pneumonia of left lower lobe due to infectious organism    Rx / DC Orders ED Discharge Orders     None        This note was created using dictation software, which may contain spelling or grammatical errors.    Audley Hose, MD 09/02/22 [phone]

## 2022-09-02 NOTE — Progress Notes (Signed)
Pharmacy Antibiotic Note  Laurie Mccoy is a 81 y.o. female admitted on 09/02/2022 with LLL PNA in setting of NSCLC.  Pharmacy has been consulted for vancomycin and cefepime dosing.  Plan: Vancomycin 1000 mg IV now, then 1250 mg IV q24 hr (est AUC 548 based on SCr 0.8; Vd 0.5) Measure vancomycin AUC at steady state as indicated SCr q48 while on vanc MRSA PCR ordered; f/u and narrow vanc as appropriate Cefepime 2 g IV q8 hr     Temp (24hrs), Avg:99.6 F (37.6 C), Min:99.1 F (37.3 C), Max:100.7 F (38.2 C)  Recent Labs  Lab 08/27/22 0739 09/02/22 1243 09/02/22 1612  WBC 4.3 3.8* 3.9*  CREATININE 0.55 0.49 0.51  LATICACIDVEN  --   --  1.5    Estimated Creatinine Clearance: 65.8 mL/min (by C-G formula based on SCr of 0.51 mg/dL).    Allergies  Allergen Reactions   Amoxicillin     itching   Compazine [Prochlorperazine]     Lose muscle control in face    Norvasc [Amlodipine] Swelling    Lower extremity swelling     Trelegy Ellipta [Fluticasone-Umeclidin-Vilant] Swelling    Swelling in legs and headache.    Antimicrobials this admission: 11/27 vancomycin >>  11/27 cefepime >>  11/27 Flagyl x 1  Dose adjustments this admission: N/a  Microbiology results: 11/27 BCx: sent  Thank you for allowing pharmacy to be a part of this patient's care.  Ryen Heitmeyer A 09/02/2022 8:51 PM

## 2022-09-02 NOTE — Progress Notes (Signed)
After a discussion with the patient and a recheck of temperature- Dr. Burr Medico (Dr. Julien Nordmann was not available) and Cassie, Heilingoette, PA were consulted and treatment was held for today.  Patient had come in anxious, but her assessment was benign with questioning. Her temperature was 99.5 F which is within parameters, but higher than average. After starting treatment, patient emphasized some symptoms that had been skimmed over earlier- greater than normal fatigue, SOB and what might have been febrile reactions previous day. Temperature was rechecked and was 100.7 F- everything was stopped (IV and fluids had been the only thing begun). Dr. Burr Medico and Rubin Payor Heilingoetter, PA were contacted. After discussion with Cassie and patient- treatment was hel. Dr. Burr Medico was in agreement. Cassie saw patient in Rm 14. Pharmacy was also aware and corrected the treatment plan.

## 2022-09-02 NOTE — Progress Notes (Signed)
This nurse escorted patient to the ED for evaluation per provider request.  This nurse gave report to ED nurse and Charge nurse, made them aware that patient is complaining of chills, fever is noted, she has intermittent panting and shortness of breath, shallow cough, chest tightness pitting edema in her lower extremities, and leg weakness.  Helped transfer patient from wheelchair to bed in ED room 18.  Husband at the bedside.

## 2022-09-02 NOTE — Progress Notes (Signed)
Elink following code sepsis °

## 2022-09-02 NOTE — Progress Notes (Signed)
A consult was received from an ED physician for vancomycin and Zosyn per pharmacy dosing (for an indication other than meningitis). The patient's profile has been reviewed for ht/wt/allergies/indication/available labs, and one-time orders entered for vancomycin, cefepime, and metronidazole d/t mild amoxicillin allergy. Further antibiotics/pharmacy consults should be ordered by admitting physician if indicated.                       Reuel Boom, PharmD, BCPS [phone] 09/02/2022, 4:52 PM

## 2022-09-02 NOTE — H&P (Signed)
History and Physical    Patient: Laurie Mccoy HYI:[id number] DOB: 08/27/1941 DOA: 09/02/2022 DOS: the patient was seen and examined on 09/02/2022 PCP: [name], NP  [facility]  Chief Complaint:  Chief Complaint  Patient presents with   Fever   HPI: Laurie Mccoy is a 81 y.o. 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  Presenting fever and shortness of breath. Her symptoms started with a general shortness of breath and weakness starting a week ago. She had non-productive cough to go along with it. She didn't have a fever at the time. She largely ignored the symptoms through the holiday. However, over the weekend, her symptoms worsened. She noted her breathing worsened. She was increasing the number of breathing treatments she was taking. She took two this morning and when to [facility] infusion. When they evaluated her, the noticed she had a fever. They held her treatment and recommended that she go to the ED. She denies any other aggravating or alleviating factors.   Review of Systems: As mentioned in the history of present illness. All other systems reviewed and are negative. Past Medical History:  Diagnosis Date   Anxiety    "had panic attacks years ago"   Asthma    COPD (chronic obstructive pulmonary disease) ([place])    Goiter 2022   History of hiatal hernia    "dx in college, never bothered me"   History of kidney stones 2012   Hypertension    Lung cancer ([place])    Sleep apnea    Past Surgical History:  Procedure Laterality Date   APPENDECTOMY     APPLICATION OF CRANIAL NAVIGATION N/A 07/11/2022   Procedure: APPLICATION OF CRANIAL NAVIGATION;  Surgeon: Karsten Ro, DO;  Location: [facility];  Service: Neurosurgery;  Laterality: N/A;   BRONCHIAL BIOPSY  06/12/2021   Procedure: BRONCHIAL BIOPSIES;  Surgeon: Garner Nash, DO;  Location: [facility];  Service: Pulmonary;;    BRONCHIAL BRUSHINGS  06/12/2021   Procedure: BRONCHIAL BRUSHINGS;  Surgeon: Garner Nash, DO;  Location: [facility];  Service: Pulmonary;;   BRONCHIAL WASHINGS  06/12/2021   Procedure: BRONCHIAL WASHINGS;  Surgeon: Garner Nash, DO;  Location: [facility];  Service: Pulmonary;;   CHOLECYSTECTOMY     COLON RESECTION     CRANIOTOMY N/A 07/11/2022   Procedure: Craniotomy for resection of tumor;  Surgeon: Karsten Ro, DO;  Location: [facility];  Service: Neurosurgery;  Laterality: N/A;  RM 19   FIDUCIAL MARKER PLACEMENT  06/12/2021   Procedure: FIDUCIAL MARKER PLACEMENT;  Surgeon: Garner Nash, DO;  Location: [facility];  Service: Pulmonary;;   hernia     x 2   TONSILLECTOMY     VIDEO BRONCHOSCOPY WITH ENDOBRONCHIAL NAVIGATION Bilateral 06/12/2021   Procedure: VIDEO BRONCHOSCOPY WITH ENDOBRONCHIAL NAVIGATION;  Surgeon: Garner Nash, DO;  Location: [facility];  Service: Pulmonary;  Laterality: Bilateral;  ION   VIDEO BRONCHOSCOPY WITH RADIAL ENDOBRONCHIAL ULTRASOUND  06/12/2021   Procedure: RADIAL ENDOBRONCHIAL ULTRASOUND;  Surgeon: Garner Nash, DO;  Location: [facility];  Service: Pulmonary;;   Social History:  reports that she quit smoking about 2 months ago. Her smoking use included cigarettes. She has a 23.00 pack-year smoking history. She has been exposed to tobacco smoke. She has never used smokeless tobacco. She reports that she does not currently use alcohol. She reports that she does not  use drugs.  Allergies  Allergen Reactions   Amoxicillin     itching   Compazine [Prochlorperazine]     Lose muscle control in face    Norvasc [Amlodipine] Swelling    Lower extremity swelling     Trelegy Ellipta [Fluticasone-Umeclidin-Vilant] Swelling    Swelling in legs and headache.    Family History  Problem Relation Age of Onset   Lung cancer Mother        never smoker   Brain cancer Mother    Lung cancer Father        smoked   Stroke Brother     Prior to  Admission medications   Medication Sig Start Date End Date Taking? Authorizing Provider  acetaminophen (TYLENOL) 500 MG tablet Take 500 mg by mouth every 6 (six) hours as needed for moderate pain.    [provider]  albuterol (PROVENTIL) (2.5 MG/3ML) 0.083% nebulizer solution Take 3 mLs (2.5 mg total) by nebulization every 4 (four) hours as needed for wheezing or shortness of breath. 04/13/19   Tanda Rockers, MD  albuterol (VENTOLIN HFA) 108 (90 Base) MCG/ACT inhaler Inhale 2 puffs into the lungs every 6 (six) hours as needed for wheezing or shortness of breath. 10/18/19   Tanda Rockers, MD  ALPRAZolam Duanne Moron) 0.25 MG tablet Take 0.25 mg by mouth at bedtime as needed for anxiety.    [provider]  Budeson-Glycopyrrol-Formoterol (BREZTRI AEROSPHERE) 160-9-4.8 MCG/ACT AERO INHALE 2 PUFFS BY MOUTH EVERY MORNING AND EVERY NIGHT AT BEDTIME 05/09/22   Tanda Rockers, MD  Emollient (UDDERLY SMOOTH EXTRA CARE 20 EX) Apply 1 Application topically as needed (dry skin).    [provider]  fluconazole (DIFLUCAN) 100 MG tablet Take 2 tablets today and then 1 tablet daily for 6 more days. 08/13/22   Marlynn Perking, PA-C  folic acid (FOLVITE) 1 MG tablet Take 1 tablet (1 mg total) by mouth daily. Patient not taking: Reported on 08/13/2022 07/22/22   Heilingoetter, Cassandra L, PA-C  hydrochlorothiazide (HYDRODIURIL) 25 MG tablet Take 25 mg by mouth daily as needed (Leg swelling).    [provider]  levETIRAcetam (KEPPRA) 500 MG tablet Take 1 tablet (500 mg total) by mouth 2 (two) times daily. Patient not taking: Reported on 08/13/2022 07/13/22   Dawley, Troy C, DO  lidocaine-prilocaine (EMLA) cream Apply 1 Application topically as needed. 08/23/22   Heilingoetter, Cassandra L, PA-C  loratadine (CLARITIN) 10 MG tablet Take 10 mg by mouth at bedtime.    [provider]  LORazepam (ATIVAN) 1 MG tablet Take 1 tablet (1 mg total) by mouth as needed. 30 min Prior to MRI or  brain radiation procedure 07/02/22   Gery Pray, MD  metFORMIN (GLUCOPHAGE-XR) 500 MG 24 hr tablet Take 500 mg by mouth every morning. 01/07/22   [provider]  nitrofurantoin, macrocrystal-monohydrate, (MACROBID) 100 MG capsule Take 1 capsule (100 mg total) by mouth 2 (two) times daily. 08/27/22   Heilingoetter, Cassandra L, PA-C  ondansetron (ZOFRAN) 8 MG tablet Take 1 tablet (8 mg total) by mouth every 8 (eight) hours as needed for nausea or vomiting. Patient not taking: Reported on 08/13/2022 07/22/22   Heilingoetter, Cassandra L, PA-C  pantoprazole (PROTONIX) 20 MG tablet Take 20 mg by mouth daily. 07/19/22   [provider]  sucralfate (CARAFATE) 1 GM/10ML suspension Take 10 mLs (1 g total) by mouth 4 (four) times daily -  with meals and at bedtime. 08/20/22   Gery Pray, MD  telmisartan (MICARDIS) 20 MG tablet Take 1 tablet (20 mg total) by mouth at bedtime. Patient not taking: Reported on 08/13/2022 04/03/22   Geralynn Rile, MD  traMADol (ULTRAM) 50 MG tablet Take 2 tablets (100 mg total) by mouth every 6 (six) hours as needed for severe pain. 07/13/22   Dawley, Theodoro Doing, DO    Physical Exam: Vitals:   09/02/22 1558  BP: (!) 179/103  Pulse: (!) 104  Resp: (!) 26  Temp: 99.1 F (37.3 C)  SpO2: 91%   General: 81 y.o. female resting in bed in NAD Eyes: PERRL, normal sclera ENMT: Nares patent w/o discharge, orophaynx clear, dentition normal, ears w/o discharge/lesions/ulcers Neck: Supple, trachea midline Cardiovascular: RRR, +S1, S2, no m/g/r, equal pulses throughout Respiratory: decreased at the bases, no w/r/r, increased WOB on 2L GI: BS+, NDNT, no masses noted, no organomegaly noted MSK: No e/c/c Neuro: A&O x 3, no focal deficits Psyc: Appropriate interaction, anxious but cooperative  Data Reviewed:  Results for orders placed or performed during the hospital encounter of 09/02/22 (from the past 24 hour(s))  CBC with Differential     Status: Abnormal    Collection Time: 09/02/22  4:12 PM  Result Value Ref Range   WBC 3.9 (L) 4.0 - 10.5 K/uL   RBC 3.80 (L) 3.87 - 5.11 MIL/uL   Hemoglobin 12.0 12.0 - 15.0 g/dL   HCT 36.8 36.0 - 46.0 %   MCV 96.8 80.0 - 100.0 fL   MCH 31.6 26.0 - 34.0 pg   MCHC 32.6 30.0 - 36.0 g/dL   RDW 13.5 11.5 - 15.5 %   Platelets 221 150 - 400 K/uL   nRBC 0.0 0.0 - 0.2 %   Neutrophils Relative % 79 %   Neutro Abs 3.1 1.7 - 7.7 K/uL   Lymphocytes Relative 9 %   Lymphs Abs 0.4 (L) 0.7 - 4.0 K/uL   Monocytes Relative 6 %   Monocytes Absolute 0.2 0.1 - 1.0 K/uL   Eosinophils Relative 4 %   Eosinophils Absolute 0.1 0.0 - 0.5 K/uL   Basophils Relative 1 %   Basophils Absolute 0.0 0.0 - 0.1 K/uL   Immature Granulocytes 1 %   Abs Immature Granulocytes 0.03 0.00 - 0.07 K/uL  Comprehensive metabolic panel     Status: Abnormal   Collection Time: 09/02/22  4:12 PM  Result Value Ref Range   Sodium 138 135 - 145 mmol/L   Potassium 3.9 3.5 - 5.1 mmol/L   Chloride 104 98 - 111 mmol/L   CO2 24 22 - 32 mmol/L   Glucose, Bld 122 (H) 70 - 99 mg/dL   BUN 12 8 - 23 mg/dL   Creatinine, Ser 0.51 0.44 - 1.00 mg/dL   Calcium 8.5 (L) 8.9 - 10.3 mg/dL   Total Protein 6.6 6.5 - 8.1 g/dL   Albumin 3.2 (L) 3.5 - 5.0 g/dL   AST 16 15 - 41 U/L   ALT 23 0 - 44 U/L   Alkaline Phosphatase 61 38 - 126 U/L   Total Bilirubin 0.7 0.3 - 1.2 mg/dL   GFR, Estimated >60 >60 mL/min   Anion gap 10 5 - 15  Magnesium     Status: None   Collection Time: 09/02/22  4:12 PM  Result Value Ref Range   Magnesium 1.9 1.7 - 2.4 mg/dL  Lactic acid, plasma     Status: None   Collection Time: 09/02/22  4:12 PM  Result Value Ref Range   Lactic Acid, Venous  1.5 0.5 - 1.9 mmol/L  Protime-INR     Status: None   Collection Time: 09/02/22  4:12 PM  Result Value Ref Range   Prothrombin Time 14.4 11.4 - 15.2 seconds   INR 1.1 0.8 - 1.2  APTT     Status: None   Collection Time: 09/02/22  4:12 PM  Result Value Ref Range   aPTT 32 24 - 36 seconds    CXR: 1. Hazy left mid lung opacity in keeping with known malignancy. 2. Dense left retrocardiac opacity, which may represent atelectasis or pneumonia.  Assessment and Plan: LLL PNA Sepsis secondary to above COPD exacerbation     - admit to inpt, progressive     - started on broad spec abx, will continue for right now     - check urine legionella, strep     - follow bld Cx, UCx     - nebs, guaifenesin     - check procal      Anxiety     - continue home regimen when confirmed  NSCLC w/ brain mets     - completed radiation; now about to start chemo     - Dr. Julien Nordmann added to team     - continue home seizure PPx  CAD HLD     - continue home regimen when confirmed  OSA     - CPAP  Advance Care Planning:   Code Status: FULL  Consults: None  Family Communication: None at bedside  Severity of Illness: The appropriate patient status for this patient is INPATIENT. Inpatient status is judged to be reasonable and necessary in order to provide the required intensity of service to ensure the patient's safety. The patient's presenting symptoms, physical exam findings, and initial radiographic and laboratory data in the context of their chronic comorbidities is felt to place them at high risk for further clinical deterioration. Furthermore, it is not anticipated that the patient will be medically stable for discharge from the hospital within 2 midnights of admission.   * I certify that at the point of admission it is my clinical judgment that the patient will require inpatient hospital care spanning beyond 2 midnights from the point of admission due to high intensity of service, high risk for further deterioration and high frequency of surveillance required.*  Author: Jonnie Finner, DO 09/02/2022 5:39 PM  For on call review [email or web address].

## 2022-09-03 ENCOUNTER — Encounter (HOSPITAL_COMMUNITY): Payer: Self-pay | Admitting: Internal Medicine

## 2022-09-03 DIAGNOSIS — C7932 Secondary malignant neoplasm of cerebral meninges: Secondary | ICD-10-CM

## 2022-09-03 DIAGNOSIS — C3490 Malignant neoplasm of unspecified part of unspecified bronchus or lung: Secondary | ICD-10-CM

## 2022-09-03 DIAGNOSIS — J441 Chronic obstructive pulmonary disease with (acute) exacerbation: Secondary | ICD-10-CM

## 2022-09-03 DIAGNOSIS — I10 Essential (primary) hypertension: Secondary | ICD-10-CM

## 2022-09-03 DIAGNOSIS — F419 Anxiety disorder, unspecified: Secondary | ICD-10-CM

## 2022-09-03 DIAGNOSIS — J189 Pneumonia, unspecified organism: Secondary | ICD-10-CM | POA: Diagnosis not present

## 2022-09-03 LAB — STREP PNEUMONIAE URINARY ANTIGEN: Strep Pneumo Urinary Antigen: NEGATIVE

## 2022-09-03 LAB — URINE CULTURE: Culture: NO GROWTH

## 2022-09-03 LAB — PROTIME-INR
INR: 1.2 (ref 0.8–1.2)
Prothrombin Time: 14.7 seconds (ref 11.4–15.2)

## 2022-09-03 LAB — CORTISOL-AM, BLOOD: Cortisol - AM: 14.1 ug/dL (ref 6.7–22.6)

## 2022-09-03 LAB — T4: T4, Total: 7.8 ug/dL (ref 4.5–12.0)

## 2022-09-03 LAB — GLUCOSE, CAPILLARY: Glucose-Capillary: 139 mg/dL — ABNORMAL HIGH (ref 70–99)

## 2022-09-03 LAB — CREATININE, SERUM
Creatinine, Ser: 0.52 mg/dL (ref 0.44–1.00)
GFR, Estimated: >60 mL/min (ref >60)

## 2022-09-03 LAB — PROCALCITONIN: Procalcitonin: <0.1 ng/mL

## 2022-09-03 MED ORDER — IPRATROPIUM-ALBUTEROL 0.5-2.5 (3) MG/3ML IN SOLN
3.0000 mL | Freq: Three times a day (TID) | RESPIRATORY_TRACT | Status: DC
  Administered 2022-09-03 – 2022-09-09 (×18): 3 mL via RESPIRATORY_TRACT
  Filled 2022-09-03 (×20): qty 3

## 2022-09-03 MED ORDER — UMECLIDINIUM BROMIDE 62.5 MCG/ACT IN AEPB
1.0000 | INHALATION_SPRAY | Freq: Every day | RESPIRATORY_TRACT | Status: DC
  Filled 2022-09-03: qty 7

## 2022-09-03 MED ORDER — FOLIC ACID 1 MG PO TABS
1.0000 mg | ORAL_TABLET | Freq: Every day | ORAL | Status: DC
  Administered 2022-09-03 – 2022-09-09 (×7): 1 mg via ORAL
  Filled 2022-09-03 (×7): qty 1

## 2022-09-03 MED ORDER — MOMETASONE FURO-FORMOTEROL FUM 100-5 MCG/ACT IN AERO
2.0000 | INHALATION_SPRAY | Freq: Two times a day (BID) | RESPIRATORY_TRACT | Status: DC
  Administered 2022-09-03: 2 via RESPIRATORY_TRACT
  Filled 2022-09-03: qty 8.8

## 2022-09-03 MED ORDER — ALBUTEROL SULFATE (2.5 MG/3ML) 0.083% IN NEBU: 2.5000 mg | INHALATION_SOLUTION | RESPIRATORY_TRACT | Status: DC | PRN

## 2022-09-03 MED ORDER — SUCRALFATE 1 GM/10ML PO SUSP
1.0000 g | Freq: Three times a day (TID) | ORAL | Status: DC
  Administered 2022-09-03 – 2022-09-07 (×7): 1 g via ORAL
  Filled 2022-09-03 (×10): qty 10

## 2022-09-03 MED ORDER — BUDESON-GLYCOPYRROL-FORMOTEROL 160-9-4.8 MCG/ACT IN AERO: 2.0000 | INHALATION_SPRAY | Freq: Two times a day (BID) | RESPIRATORY_TRACT | Status: DC

## 2022-09-03 MED ORDER — PANTOPRAZOLE SODIUM 20 MG PO TBEC
20.0000 mg | DELAYED_RELEASE_TABLET | Freq: Every evening | ORAL | Status: DC
  Administered 2022-09-03 – 2022-09-08 (×6): 20 mg via ORAL
  Filled 2022-09-03 (×7): qty 1

## 2022-09-03 MED ORDER — ALPRAZOLAM 0.25 MG PO TABS
0.2500 mg | ORAL_TABLET | Freq: Every evening | ORAL | Status: DC | PRN
  Administered 2022-09-06 – 2022-09-07 (×2): 0.25 mg via ORAL
  Filled 2022-09-03 (×2): qty 1

## 2022-09-03 MED ORDER — IRBESARTAN 75 MG PO TABS
75.0000 mg | ORAL_TABLET | Freq: Every day | ORAL | Status: DC
  Administered 2022-09-03 – 2022-09-08 (×6): 75 mg via ORAL
  Filled 2022-09-03 (×7): qty 1

## 2022-09-03 MED ORDER — INSULIN ASPART 100 UNIT/ML IJ SOLN
0.0000 [IU] | Freq: Three times a day (TID) | INTRAMUSCULAR | Status: DC
  Administered 2022-09-04 – 2022-09-06 (×2): 5 [IU] via SUBCUTANEOUS
  Administered 2022-09-07: 2 [IU] via SUBCUTANEOUS
  Administered 2022-09-07: 3 [IU] via SUBCUTANEOUS
  Administered 2022-09-08: 2 [IU] via SUBCUTANEOUS
  Administered 2022-09-08: 5 [IU] via SUBCUTANEOUS
  Filled 2022-09-03: qty 0.15

## 2022-09-03 NOTE — Progress Notes (Addendum)
PROGRESS NOTE    Laurie Mccoy  WYO:[id number] DOB: [birth year] DOA: 09/02/2022 PCP: [name], NP   Brief Narrative: Laurie Mccoy is a 81 y.o. 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 Patient presented secondary to fever and shortness of breath just before receiving infusion at cancer center. Initial imaging concerning for likely pneumonia. Associated hypoxia requiring supplemental oxygen. Empiric Vancomycin and Cefepime started on admission.   Assessment and Plan:  Left lower lobe pneumonia Suggested on imaging. Associated fever. Mild leukopenia driven by lymphocytopenia. Patient started empirically on Vancomycin and Cefepime. Blood cultures obtained and are no growth to date. Procalcitonin is not detectable. COVID-19/influenza negative. -Continue Vancomycin/Cefepime -Check RVP to rule out other viral illness  Sepsis Present on admission. Secondary to pneumonia. Blood and urine culture obtained. Urine culture without growth.  Acute respiratory failure with hypoxia Patient is normally on room air. Patient with shortness of breath with associated hypoxia requiring supplemental oxygen. -Wean to room air -Ambulatory pulse ox  COPD No exacerbation. Patient with non-productive cough. No wheezing.  Anxiety -Continue home Xanax  Non-small cell lung cancer Brain metastasis Patient is s/p SRS of brain metastatic lesion and craniotomy performed on 10/4. Started on systemic chemotherapy on 11/27, however did not get her dose secondary to infection concern. Patient was on Keppra for seizure prophylaxis, however per neurooncology, no longer requires prophylactic therapy s/p craniotomy.  Non-obstructive CAD Noted. Patient is no longer on aspirin.  Primary hypertension Uncontrolled blood pressure currently. Patient is listed as not taking hydrochlorothiazide but is taking telmisartan -Resume  telmisartan (irbesartan while inpatient)  Diabetes mellitus, type 2 Patient is on metformin as an outpatient. No hemoglobin A1C available in chart. No fasting blood sugar available currently. -SSI -Check hemoglobin A1C in AM  OSA -Continue CPAP  DVT prophylaxis: Lovenox Code Status:   Code Status: Full Code Family Communication: None at bedside Disposition Plan: Discharge home likely in 2-3 days pending wean to room air if able and transition to oral antibiotics   Consultants:  None  Procedures:  None  Antimicrobials: Vancomycin Cefepime    Subjective: Patient reports dyspnea today. Non-productive cough. Chills.  Objective: BP (!) 152/68   Pulse 95   Temp 98.7 F (37.1 C) (Oral)   Resp (!) 24   SpO2 95%   Examination:  General exam: Appears calm and comfortable Respiratory system: Diminished with mild rales on left side. Respiratory effort normal. Cardiovascular system: S1 & S2 heard, RRR. No murmurs, rubs, gallops or clicks. Gastrointestinal system: Abdomen is nondistended, soft and nontender. Normal bowel sounds heard. Central nervous system: Alert and oriented. No focal neurological deficits. Musculoskeletal: No edema. No calf tenderness Skin: No cyanosis. No rashes Psychiatry: Judgement and insight appear normal. Mood & affect appropriate.    Data Reviewed: I have personally reviewed following labs and imaging studies  CBC Lab Results  Component Value Date   WBC 3.9 (L) 09/02/2022   RBC 3.80 (L) 09/02/2022   HGB 12.0 09/02/2022   HCT 36.8 09/02/2022   MCV 96.8 09/02/2022   MCH 31.6 09/02/2022   PLT 221 09/02/2022   MCHC 32.6 09/02/2022   RDW 13.5 09/02/2022   LYMPHSABS 0.4 (L) 09/02/2022   MONOABS 0.2 09/02/2022   EOSABS 0.1 09/02/2022   BASOSABS 0.0 77/41/2878     Last metabolic panel Lab Results  Component Value Date   NA 138 09/02/2022   K 3.9 09/02/2022   CL 104 09/02/2022  CO2 24 09/02/2022   BUN 12 09/02/2022   CREATININE 0.52  09/03/2022   GLUCOSE 122 (H) 09/02/2022   GFRNONAA >60 09/03/2022   GFRAA 100 09/17/2019   CALCIUM 8.5 (L) 09/02/2022   PROT 6.6 09/02/2022   ALBUMIN 3.2 (L) 09/02/2022   BILITOT 0.7 09/02/2022   ALKPHOS 61 09/02/2022   AST 16 09/02/2022   ALT 23 09/02/2022   ANIONGAP 10 09/02/2022    GFR: Estimated Creatinine Clearance: 65.8 mL/min (by C-G formula based on SCr of 0.52 mg/dL).  Recent Results (from the past 240 hour(s))  Urine Culture     Status: Abnormal   Collection Time: 08/27/22  9:01 AM   Specimen: Urine, Clean Catch  Result Value Ref Range Status   Specimen Description   Final    URINE, CLEAN CATCH Performed at [facility], [address] [place], [place] [address]    Special Requests   Final    URINE, CLEAN CATCH Performed at [facility], [place] [address], [place], [place] [address]    Culture 80,000 COLONIES/mL CITROBACTER FREUNDII (A)  Final   Report Status 08/29/2022 FINAL  Final   Organism ID, Bacteria CITROBACTER FREUNDII (A)  Final      Susceptibility   Citrobacter freundii - MIC*    CEFAZOLIN >=64 RESISTANT Resistant     CEFEPIME <=0.12 SENSITIVE Sensitive     CEFTRIAXONE <=0.25 SENSITIVE Sensitive     CIPROFLOXACIN <=0.25 SENSITIVE Sensitive     GENTAMICIN <=1 SENSITIVE Sensitive     IMIPENEM <=0.25 SENSITIVE Sensitive     NITROFURANTOIN <=16 SENSITIVE Sensitive     TRIMETH/SULFA <=20 SENSITIVE Sensitive     PIP/TAZO <=4 SENSITIVE Sensitive     * 80,000 COLONIES/mL CITROBACTER FREUNDII  Resp Panel by RT-PCR (Flu A&B, Covid) Anterior Nasal Swab     Status: None   Collection Time: 09/02/22  3:52 PM   Specimen: Anterior Nasal Swab  Result Value Ref Range Status   SARS Coronavirus 2 by RT PCR NEGATIVE NEGATIVE Final    Comment: (NOTE) SARS-CoV-2 target nucleic acids are NOT DETECTED.  The SARS-CoV-2 RNA is generally detectable in upper respiratory specimens during the acute phase of infection. The  lowest concentration of SARS-CoV-2 viral copies this assay can detect is 138 copies/mL. A negative result does not preclude SARS-Cov-2 infection and should not be used as the sole basis for treatment or other patient management decisions. A negative result may occur with  improper specimen collection/handling, submission of specimen other than nasopharyngeal swab, presence of viral mutation(s) within the areas targeted by this assay, and inadequate number of viral copies(<138 copies/mL). A negative result must be combined with clinical observations, patient history, and epidemiological information. The expected result is Negative.  Fact Sheet for Patients:  EntrepreneurPulse.com.au  Fact Sheet for Healthcare Providers:  IncredibleEmployment.be  This test is no t yet approved or cleared by the [place] FDA and  has been authorized for detection and/or diagnosis of SARS-CoV-2 by FDA under an Emergency Use Authorization (EUA). This EUA will remain  in effect (meaning this test can be used) for the duration of the COVID-19 declaration under Section 564(b)(1) of the Act, 21 U.S.C.section 360bbb-3(b)(1), unless the authorization is terminated  or revoked sooner.       Influenza A by PCR NEGATIVE NEGATIVE Final   Influenza B by PCR NEGATIVE NEGATIVE Final    Comment: (NOTE) The Xpert Xpress SARS-CoV-2/FLU/RSV plus assay is intended as an aid in the  diagnosis of influenza from Nasopharyngeal swab specimens and should not be used as a sole basis for treatment. Nasal washings and aspirates are unacceptable for Xpert Xpress SARS-CoV-2/FLU/RSV testing.  Fact Sheet for Patients: EntrepreneurPulse.com.au  Fact Sheet for Healthcare Providers: IncredibleEmployment.be  This test is not yet approved or cleared by the [place] FDA and has been authorized for detection and/or diagnosis of SARS-CoV-2 by FDA under  an Emergency Use Authorization (EUA). This EUA will remain in effect (meaning this test can be used) for the duration of the COVID-19 declaration under Section 564(b)(1) of the Act, 21 U.S.C. section 360bbb-3(b)(1), unless the authorization is terminated or revoked.  Performed at [facility], [place] [address], [place], [place] [address]   Blood culture (routine x 2)     Status: None (Preliminary result)   Collection Time: 09/02/22  4:12 PM   Specimen: BLOOD  Result Value Ref Range Status   Specimen Description   Final    BLOOD SITE NOT SPECIFIED Performed at [facility] [address] [place], [place] [address]    Special Requests   Final    BOTTLES DRAWN AEROBIC AND ANAEROBIC Blood Culture adequate volume Performed at [facility] [address] [place], [place] [address]    Culture   Final    NO GROWTH < 12 HOURS Performed at [facility] [address] [place], [place] [address]    Report Status PENDING  Incomplete      Radiology Studies: DG Chest Portable 1 View  Result Date: 09/02/2022 CLINICAL DATA:  Patient with non-small cell lung cancer with chills, shortness of breath, coughing, and fever EXAM: PORTABLE CHEST 1 VIEW COMPARISON:  CT chest dated 07/04/2022, chest radiograph dated 02/13/2022 FINDINGS: Normal lung volumes. Hazy left mid lung opacity in keeping with known malignancy. Dense left retrocardiac opacity. Metallic fiducial projects over the right mid lung. No pleural effusion or pneumothorax. Enlarged cardiomediastinal silhouette is likely projectional. The visualized skeletal structures are unremarkable. IMPRESSION: 1. Hazy left mid lung opacity in keeping with known malignancy. 2. Dense left retrocardiac opacity, which may represent atelectasis or pneumonia. Electronically Signed   By: Darrin Nipper M.D.   On: 09/02/2022 17:02      LOS: 1 day    Cordelia Poche, MD [facility] 09/03/2022,  10:28 AM   If 7PM-7AM, please contact night-coverage [email or web address]

## 2022-09-03 NOTE — Hospital Course (Signed)
Laurie Mccoy is a 81 y.o. 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 Patient presented secondary to fever and shortness of breath just before receiving infusion at cancer center. Initial imaging concerning for likely pneumonia. Associated hypoxia requiring supplemental oxygen. Empiric Vancomycin and Cefepime started on admission.

## 2022-09-04 DIAGNOSIS — J189 Pneumonia, unspecified organism: Secondary | ICD-10-CM | POA: Diagnosis not present

## 2022-09-04 DIAGNOSIS — R6520 Severe sepsis without septic shock: Secondary | ICD-10-CM

## 2022-09-04 DIAGNOSIS — I10 Essential (primary) hypertension: Secondary | ICD-10-CM | POA: Diagnosis not present

## 2022-09-04 DIAGNOSIS — J441 Chronic obstructive pulmonary disease with (acute) exacerbation: Secondary | ICD-10-CM | POA: Diagnosis not present

## 2022-09-04 DIAGNOSIS — A419 Sepsis, unspecified organism: Secondary | ICD-10-CM

## 2022-09-04 DIAGNOSIS — J9600 Acute respiratory failure, unspecified whether with hypoxia or hypercapnia: Secondary | ICD-10-CM

## 2022-09-04 LAB — RESPIRATORY PANEL BY PCR

## 2022-09-04 LAB — LEGIONELLA PNEUMOPHILA SEROGP 1 UR AG: L. pneumophila Serogp 1 Ur Ag: NEGATIVE

## 2022-09-04 LAB — GLUCOSE, CAPILLARY
Glucose-Capillary: 102 mg/dL — ABNORMAL HIGH (ref 70–99)
Glucose-Capillary: 106 mg/dL — ABNORMAL HIGH (ref 70–99)
Glucose-Capillary: 217 mg/dL — ABNORMAL HIGH (ref 70–99)
Glucose-Capillary: 199 mg/dL — ABNORMAL HIGH (ref 70–99)

## 2022-09-04 LAB — HEMOGLOBIN A1C
Hgb A1c MFr Bld: 6.4 % — ABNORMAL HIGH (ref 4.8–5.6)
Mean Plasma Glucose: 137 mg/dL

## 2022-09-04 LAB — COMPREHENSIVE METABOLIC PANEL
ALT: 19 U/L (ref 0–44)
AST: 14 U/L — ABNORMAL LOW (ref 15–41)
Albumin: 2.4 g/dL — ABNORMAL LOW (ref 3.5–5.0)
Alkaline Phosphatase: 50 U/L (ref 38–126)
Anion gap: 7 (ref 5–15)
BUN: 12 mg/dL (ref 8–23)
CO2: 24 mmol/L (ref 22–32)
Calcium: 7.8 mg/dL — ABNORMAL LOW (ref 8.9–10.3)
Chloride: 105 mmol/L (ref 98–111)
Creatinine, Ser: 0.49 mg/dL (ref 0.44–1.00)
GFR, Estimated: >60 mL/min (ref >60)
Glucose, Bld: 107 mg/dL — ABNORMAL HIGH (ref 70–99)
Potassium: 3.6 mmol/L (ref 3.5–5.1)
Sodium: 136 mmol/L (ref 135–145)
Total Bilirubin: 0.6 mg/dL (ref 0.3–1.2)
Total Protein: 5.5 g/dL — ABNORMAL LOW (ref 6.5–8.1)

## 2022-09-04 LAB — MRSA NEXT GEN BY PCR, NASAL: MRSA by PCR Next Gen: NOT DETECTED

## 2022-09-04 LAB — CBC
HCT: 32.6 % — ABNORMAL LOW (ref 36.0–46.0)
Hemoglobin: 10.7 g/dL — ABNORMAL LOW (ref 12.0–15.0)
MCH: 31.6 pg (ref 26.0–34.0)
MCHC: 32.8 g/dL (ref 30.0–36.0)
MCV: 96.2 fL (ref 80.0–100.0)
Platelets: 216 10*3/uL (ref 150–400)
RBC: 3.39 MIL/uL — ABNORMAL LOW (ref 3.87–5.11)
RDW: 13.5 % (ref 11.5–15.5)
WBC: 3.6 10*3/uL — ABNORMAL LOW (ref 4.0–10.5)
nRBC: 0 % (ref 0.0–0.2)

## 2022-09-04 LAB — HIV ANTIBODY (ROUTINE TESTING W REFLEX): HIV Screen 4th Generation wRfx: NONREACTIVE

## 2022-09-04 MED ORDER — BUDESON-GLYCOPYRROL-FORMOTEROL 160-9-4.8 MCG/ACT IN AERO
2.0000 | INHALATION_SPRAY | Freq: Two times a day (BID) | RESPIRATORY_TRACT | Status: DC
  Administered 2022-09-07 – 2022-09-08 (×2): 2 via RESPIRATORY_TRACT

## 2022-09-04 MED ORDER — METHYLPREDNISOLONE SODIUM SUCC 125 MG IJ SOLR
60.0000 mg | Freq: Once | INTRAMUSCULAR | Status: AC
  Administered 2022-09-04: 60 mg via INTRAVENOUS
  Filled 2022-09-04: qty 2

## 2022-09-04 MED ORDER — BUDESON-GLYCOPYRROL-FORMOTEROL 160-9-4.8 MCG/ACT IN AERO: 2.0000 | INHALATION_SPRAY | Freq: Two times a day (BID) | RESPIRATORY_TRACT | Status: DC

## 2022-09-04 NOTE — TOC Initial Note (Signed)
Transition of Care [facility]) - Initial/Assessment Note    Patient Details  Name: Laurie Mccoy MRN: [id number] Date of Birth: 1941-01-22  Transition of Care [facility]) CM/SW Contact:    Henrietta Dine, RN Phone Number: 09/04/2022, 9:05 AM  Clinical Narrative:                  Transition of Care [facility]) Screening Note   Patient Details  Name: Laurie Mccoy Date of Birth: 08/18/1941   Transition of Care [facility]) CM/SW Contact:    Henrietta Dine, RN Phone Number: 09/04/2022, 9:05 AM    Transition of Care [facility]) has reviewed patient and no TOC needs have been identified at this time. We will continue to monitor patient advancement through interdisciplinary progression rounds. If new patient transition needs arise, please place a TOC consult.          Patient Goals and CMS Choice        Expected Discharge Plan and Services                                                Prior Living Arrangements/Services                       Activities of Daily Living      Permission Sought/Granted                  Emotional Assessment              Admission diagnosis:  PNA (pneumonia) [J18.9] Pneumonia of left lower lobe due to infectious organism [J18.9] Patient Active Problem List   Diagnosis Date Noted   Shortness of breath 09/02/2022   Fever 09/02/2022   PNA (pneumonia) 09/02/2022   CAD (coronary artery disease) 09/02/2022   HLD (hyperlipidemia) 09/02/2022   Sepsis ([place]) 09/02/2022   Anxiety 09/02/2022   S/P craniotomy 07/11/2022   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 ([place]) 07/11/2022   Secondary malignant neoplasm of brain ([place]) 07/08/2022   Malignant neoplasm of unspecified part of unspecified bronchus or lung ([place]) 06/21/2022   Goals of care, counseling/discussion 06/21/2022   CAP (community acquired pneumonia) 02/13/2022   Medication management 11/01/2019   Multiple pulmonary nodules  determined by computed tomography of lung 10/06/2019   Thyroid nodule 10/06/2019   Essential hypertension 07/29/2019   COPD with acute exacerbation ([place]) 03/10/2019   Ventral hernia, recurrent 03/10/2019   Cigarette smoker 03/10/2019   Cigarette nicotine dependence without complication 98/33/8250   Obesity (BMI 30.0-34.9) 07/15/2018   Obstructive sleep apnea syndrome 01/01/2016   PVC's (premature ventricular contractions) 01/01/2016   PCP:  [name], NP Pharmacy:   [place], [place] - [address] [place] [address] Phone: [phone] Fax: [place] #[id number] - [place], [place] - [address] [place] [address] Phone: [phone] Fax: [phone]  [facility] [place], [place] [address] [place] [address] Phone: [phone] Fax: [phone]     Social Determinants of Health (SDOH) Interventions    Readmission Risk Interventions     No data to display

## 2022-09-04 NOTE — Progress Notes (Signed)
[facility]                                                                               Laurie Mccoy, is a 81 y.o. female, DOB - 19-Feb-1941, GEZ:[id number] Admit date - 09/02/2022    Outpatient Primary MD for the patient is [name], NP  LOS - 2  days    Brief summary   Laurie Mccoy is a 81 y.o. 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 Patient presented secondary to fever and shortness of breath just before receiving infusion at cancer center. Initial imaging concerning for likely pneumonia. Associated hypoxia requiring supplemental oxygen. Empiric Vancomycin and Cefepime started on admission.   Assessment & Plan    Assessment and Plan:   Acute respiratory failure with hypoxia probably secondary to left lower lobe pneumonia in the setting of non-small cell lung cancer with brain mets and COPD She is currently required up to 2 L of nasal cannula oxygen to keep sats greater than 90% she was started on broad-spectrum IV antibiotics and blood cultures were obtained.  Her COVID-19 and influenza PCR is negative RVP is negative so far. Follow blood cultures and sputum cultures. Today she has scattered wheezing with diminished air entry at bases will add incentive spirometry and flutter valve. Will order a dose of IV Solu-Medrol and reevaluate in the morning to see if she needs more steroids. Continue with home inhalers, DuoNebs as needed.    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  She was scheduled to start chemotherapy on 09/02/2022.  Hypertension Blood pressure parameters are improving Continue home medications.    Type 2 diabetes mellitus CBG (last 3)  Well controlled CBG'S Recent Labs    09/03/22 2246 09/04/22 0759 09/04/22 1149  GLUCAP 139* 102* 106*     Sliding scale insulin, hemoglobin A1c is pending.    Nonobstructive coronary artery disease     Anxiety Continue with Xanax   Sepsis present on leg admission secondary to left lower lobe pneumonia. Follow urine and blood cultures.   Anemia of chronic disease Hemoglobin stable around 10, continue to monitor.    Estimated body mass index is 32.27 kg/m as calculated from the following:   Height as of 07/22/22: 5' 6.5" (1.689 m).   Weight as of an earlier encounter on 09/02/22: 92.1 kg. Code Status: full code.  DVT Prophylaxis:  enoxaparin (LOVENOX) injection 40 mg Start: 09/03/22 1000   Level of Care: Level of care: Progressive Family Communication: none at bedside.   Disposition Plan:     Remains inpatient appropriate:  IV antibiotics  Procedures:  None.   Consultants:   None.   Antimicrobials:   Anti-infectives (From admission, onward)    Start     Dose/Rate Route Frequency Ordered Stop   09/03/22 0600  vancomycin (VANCOREADY) IVPB 1250 mg/250 mL        1,250 mg 166.7 mL/hr over 90 Minutes Intravenous Daily 09/02/22 2049 09/10/22 0559   09/02/22 2200  ceFEPIme (MAXIPIME) 2 g in sodium  chloride 0.9 % 100 mL IVPB        2 g 200 mL/hr over 30 Minutes Intravenous Every 8 hours 09/02/22 2049 09/09/22 1359   09/02/22 1700  vancomycin (VANCOCIN) IVPB 1000 mg/200 mL premix        1,000 mg 200 mL/hr over 60 Minutes Intravenous  Once 09/02/22 1651 09/03/22 0725   09/02/22 1700  ceFEPIme (MAXIPIME) 2 g in sodium chloride 0.9 % 100 mL IVPB        2 g 200 mL/hr over 30 Minutes Intravenous  Once 09/02/22 1651 09/02/22 1740   09/02/22 1700  metroNIDAZOLE (FLAGYL) IVPB 500 mg        500 mg 100 mL/hr over 60 Minutes Intravenous  Once 09/02/22 1651 09/02/22 1741        Medications  Scheduled Meds:  Budeson-Glycopyrrol-Formoterol  2 puff Inhalation BID   enoxaparin (LOVENOX) injection  40 mg Subcutaneous Y77A   folic acid  1 mg Oral Daily   guaiFENesin  600  mg Oral BID   insulin aspart  0-15 Units Subcutaneous TID WC   ipratropium-albuterol  3 mL Nebulization TID   irbesartan  75 mg Oral Daily   pantoprazole  20 mg Oral QPM   sucralfate  1 g Oral TID WC & HS   Continuous Infusions:  ceFEPime (MAXIPIME) IV 2 g (09/04/22 0506)   vancomycin 1,250 mg (09/04/22 1319)   PRN Meds:.acetaminophen **OR** acetaminophen, albuterol, ALPRAZolam, ondansetron **OR** ondansetron (ZOFRAN) IV    Subjective:   Laurie Mccoy was seen and examined today.  No chest pain, still very sob.   Objective:   Vitals:   09/04/22 0112 09/04/22 0533 09/04/22 0856 09/04/22 1238  BP: (!) 146/64 (!) 145/67  (!) 156/70  Pulse: (!) 103 91  95  Resp: 18 16    Temp: 99.7 F (37.6 C) 99.3 F (37.4 C)  98.8 F (37.1 C)  TempSrc: Oral Oral  Oral  SpO2: (!) 89% 93% 92% 94%    Intake/Output Summary (Last 24 hours) at 09/04/2022 1416 Last data filed at 09/04/2022 0909 Gross per 24 hour  Intake 240 ml  Output 500 ml  Net -260 ml   There were no vitals filed for this visit.   Exam General exam: Appears calm and comfortable  Respiratory system: diminished air entry at bases, on 2 lit of [place] oxygen. WITH SCATTERED WHEEZING.  Cardiovascular system: S1 & S2 heard, RRR. No JVD, Gastrointestinal system: Abdomen is nondistended, soft and nontender.  Central nervous system: Alert and oriented. No focal neurological deficits. Extremities: Symmetric 5 x 5 power. Skin: No rashes, lesions or ulcers Psychiatry:  Mood & affect appropriate.     Data Reviewed:  I have personally reviewed following labs and imaging studies   CBC Lab Results  Component Value Date   WBC 3.6 (L) 09/04/2022   RBC 3.39 (L) 09/04/2022   HGB 10.7 (L) 09/04/2022   HCT 32.6 (L) 09/04/2022   MCV 96.2 09/04/2022   MCH 31.6 09/04/2022   PLT 216 09/04/2022   MCHC 32.8 09/04/2022   RDW 13.5 09/04/2022   LYMPHSABS 0.4 (L) 09/02/2022   MONOABS 0.2 09/02/2022   EOSABS 0.1 09/02/2022    BASOSABS 0.0 12/87/8676     Last metabolic panel Lab Results  Component Value Date   NA 136 09/04/2022   K 3.6 09/04/2022   CL 105 09/04/2022   CO2 24 09/04/2022   BUN 12 09/04/2022   CREATININE 0.49 09/04/2022   GLUCOSE 107 (H) 09/04/2022  GFRNONAA >60 09/04/2022   GFRAA 100 09/17/2019   CALCIUM 7.8 (L) 09/04/2022   PROT 5.5 (L) 09/04/2022   ALBUMIN 2.4 (L) 09/04/2022   BILITOT 0.6 09/04/2022   ALKPHOS 50 09/04/2022   AST 14 (L) 09/04/2022   ALT 19 09/04/2022   ANIONGAP 7 09/04/2022    CBG (last 3)  Recent Labs    09/03/22 2246 09/04/22 0759 09/04/22 1149  GLUCAP 139* 102* 106*      Coagulation Profile: Recent Labs  Lab 09/02/22 1612 09/03/22 0502  INR 1.1 1.2     Radiology Studies: DG Chest Portable 1 View  Result Date: 09/02/2022 CLINICAL DATA:  Patient with non-small cell lung cancer with chills, shortness of breath, coughing, and fever EXAM: PORTABLE CHEST 1 VIEW COMPARISON:  CT chest dated 07/04/2022, chest radiograph dated 02/13/2022 FINDINGS: Normal lung volumes. Hazy left mid lung opacity in keeping with known malignancy. Dense left retrocardiac opacity. Metallic fiducial projects over the right mid lung. No pleural effusion or pneumothorax. Enlarged cardiomediastinal silhouette is likely projectional. The visualized skeletal structures are unremarkable. IMPRESSION: 1. Hazy left mid lung opacity in keeping with known malignancy. 2. Dense left retrocardiac opacity, which may represent atelectasis or pneumonia. Electronically Signed   By: Darrin Nipper M.D.   On: 09/02/2022 17:02       Hosie Poisson M.D. [facility] 09/04/2022, 2:16 PM  Available via Epic secure chat 7am-7pm After 7 pm, please refer to night coverage provider listed on amion.

## 2022-09-04 NOTE — Plan of Care (Signed)
  Problem: Coping: Goal: Ability to adjust to condition or change in health will improve Outcome: Progressing   Problem: Health Behavior/Discharge Planning: Goal: Ability to identify and utilize available resources and services will improve Outcome: Progressing Goal: Ability to manage health-related needs will improve Outcome: Progressing

## 2022-09-05 ENCOUNTER — Other Ambulatory Visit: Payer: Self-pay | Admitting: Radiology

## 2022-09-05 ENCOUNTER — Telehealth: Payer: Self-pay | Admitting: Internal Medicine

## 2022-09-05 ENCOUNTER — Other Ambulatory Visit: Payer: Self-pay

## 2022-09-05 DIAGNOSIS — A419 Sepsis, unspecified organism: Secondary | ICD-10-CM | POA: Diagnosis not present

## 2022-09-05 DIAGNOSIS — J189 Pneumonia, unspecified organism: Secondary | ICD-10-CM | POA: Diagnosis not present

## 2022-09-05 DIAGNOSIS — J441 Chronic obstructive pulmonary disease with (acute) exacerbation: Secondary | ICD-10-CM | POA: Diagnosis not present

## 2022-09-05 DIAGNOSIS — I10 Essential (primary) hypertension: Secondary | ICD-10-CM | POA: Diagnosis not present

## 2022-09-05 LAB — GLUCOSE, CAPILLARY
Glucose-Capillary: 96 mg/dL (ref 70–99)
Glucose-Capillary: 136 mg/dL — ABNORMAL HIGH (ref 70–99)
Glucose-Capillary: 114 mg/dL — ABNORMAL HIGH (ref 70–99)
Glucose-Capillary: 127 mg/dL — ABNORMAL HIGH (ref 70–99)

## 2022-09-05 LAB — CREATININE, SERUM
Creatinine, Ser: 0.48 mg/dL (ref 0.44–1.00)
GFR, Estimated: >60 mL/min (ref >60)

## 2022-09-05 MED ORDER — PREDNISONE 20 MG PO TABS
40.0000 mg | ORAL_TABLET | Freq: Every day | ORAL | Status: DC
  Administered 2022-09-06: 40 mg via ORAL
  Filled 2022-09-05 (×2): qty 2

## 2022-09-05 NOTE — Plan of Care (Signed)
  Problem: Clinical Measurements: Goal: Diagnostic test results will improve Outcome: Progressing   Problem: Respiratory: Goal: Ability to maintain adequate ventilation will improve Outcome: Progressing   Problem: Education: Goal: Ability to describe self-care measures that may prevent or decrease complications (Diabetes Survival Skills Education) will improve Outcome: Progressing

## 2022-09-05 NOTE — Progress Notes (Signed)
[facility]                                                                               Laurie Mccoy, is a 81 y.o. female, DOB - 1941/04/12, [id number] Admit date - 09/02/2022    Outpatient Primary MD for the patient is [name], NP  LOS - 3  days    Brief summary   Laurie Mccoy is a 81 y.o. 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 Patient presented secondary to fever and shortness of breath just before receiving infusion at cancer center. Initial imaging concerning for likely pneumonia. Associated hypoxia requiring supplemental oxygen. Empiric Vancomycin and Cefepime started on admission.   Assessment & Plan    Assessment and Plan:   Acute respiratory failure with hypoxia probably secondary to left lower lobe pneumonia in the setting of non-small cell lung cancer with brain mets and COPD She is currently required up to 2 L of nasal cannula oxygen to keep sats greater than 90% she was started on broad-spectrum IV antibiotics and blood cultures were obtained.  Her COVID-19 and influenza PCR is negative RVP is negative so far. Follow blood cultures and sputum cultures. Today she has scattered wheezing with diminished air entry at bases will add incentive spirometry and flutter valve. Added prednisone 40 mg daily for 4 more days.  Continue with home inhalers, DuoNebs as needed.    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  She was scheduled to start chemotherapy on 09/02/2022.  Hypertension BP parameters are optimal.  Continue home medications.    Type 2 diabetes mellitus CBG (last 3)  Well controlled CBG'S Recent Labs    09/04/22 2155 09/05/22 0736 09/05/22 1144  GLUCAP 199* 96 136*     Sliding scale insulin, hemoglobin A1c is 6.4%    Nonobstructive  coronary artery disease     Anxiety Continue with Xanax   Sepsis present on leg admission secondary to left lower lobe pneumonia. Follow urine and blood cultures.   Anemia of chronic disease Hemoglobin stable around 10, continue to monitor.    Estimated body mass index is 32.27 kg/m as calculated from the following:   Height as of 07/22/22: 5' 6.5" (1.689 m).   Weight as of an earlier encounter on 09/02/22: 92.1 kg. Code Status: full code.  DVT Prophylaxis:  enoxaparin (LOVENOX) injection 40 mg Start: 09/03/22 1000   Level of Care: Level of care: Progressive Family Communication: none at bedside.   Disposition Plan:     Remains inpatient appropriate:  IV antibiotics  Procedures:  None.   Consultants:   None.   Antimicrobials:   Anti-infectives (From admission, onward)    Start     Dose/Rate Route Frequency Ordered Stop   09/03/22 0600  vancomycin (VANCOREADY) IVPB 1250 mg/250 mL  Status:  Discontinued        1,250 mg 166.7 mL/hr over 90 Minutes Intravenous Daily 09/02/22 2049 09/04/22 2051   09/02/22 2200  ceFEPIme (MAXIPIME) 2 g in sodium chloride 0.9 % 100  mL IVPB        2 g 200 mL/hr over 30 Minutes Intravenous Every 8 hours 09/02/22 2049 09/09/22 1359   09/02/22 1700  vancomycin (VANCOCIN) IVPB 1000 mg/200 mL premix        1,000 mg 200 mL/hr over 60 Minutes Intravenous  Once 09/02/22 1651 09/03/22 0725   09/02/22 1700  ceFEPIme (MAXIPIME) 2 g in sodium chloride 0.9 % 100 mL IVPB        2 g 200 mL/hr over 30 Minutes Intravenous  Once 09/02/22 1651 09/02/22 1740   09/02/22 1700  metroNIDAZOLE (FLAGYL) IVPB 500 mg        500 mg 100 mL/hr over 60 Minutes Intravenous  Once 09/02/22 1651 09/02/22 1741        Medications  Scheduled Meds:  Budeson-Glycopyrrol-Formoterol  2 puff Inhalation BID   enoxaparin (LOVENOX) injection  40 mg Subcutaneous R48N   folic acid  1 mg Oral Daily   guaiFENesin  600 mg Oral BID   insulin aspart  0-15 Units Subcutaneous  TID WC   ipratropium-albuterol  3 mL Nebulization TID   irbesartan  75 mg Oral Daily   pantoprazole  20 mg Oral QPM   predniSONE  40 mg Oral QAC breakfast   sucralfate  1 g Oral TID WC & HS   Continuous Infusions:  ceFEPime (MAXIPIME) IV 2 g (09/05/22 0524)   PRN Meds:.acetaminophen **OR** acetaminophen, albuterol, ALPRAZolam, ondansetron **OR** ondansetron (ZOFRAN) IV    Subjective:   Laurie Mccoy was seen and examined today.  Cough and breathing has improved.   Objective:   Vitals:   09/05/22 0405 09/05/22 0751 09/05/22 0752 09/05/22 1405  BP: (!) 145/67     Pulse: 97     Resp: 20     Temp: 98 F (36.7 C)     TempSrc: Oral     SpO2: 90% (!) 82% 97% 91%    Intake/Output Summary (Last 24 hours) at 09/05/2022 1407 Last data filed at 09/05/2022 0536 Gross per 24 hour  Intake 300 ml  Output 1450 ml  Net -1150 ml    There were no vitals filed for this visit.   Exam General exam: Appears calm and comfortable  Respiratory system: Clear to auscultation. Respiratory effort normal. Cardiovascular system: S1 & S2 heard, RRR. No JVD,  Gastrointestinal system: Abdomen is nondistended, soft and nontender.  Central nervous system: Alert and oriented. No focal neurological deficits. Extremities: Symmetric 5 x 5 power. Skin: No rashes, lesions or ulcers Psychiatry:  Mood & affect appropriate.    Data Reviewed:  I have personally reviewed following labs and imaging studies   CBC Lab Results  Component Value Date   WBC 3.6 (L) 09/04/2022   RBC 3.39 (L) 09/04/2022   HGB 10.7 (L) 09/04/2022   HCT 32.6 (L) 09/04/2022   MCV 96.2 09/04/2022   MCH 31.6 09/04/2022   PLT 216 09/04/2022   MCHC 32.8 09/04/2022   RDW 13.5 09/04/2022   LYMPHSABS 0.4 (L) 09/02/2022   MONOABS 0.2 09/02/2022   EOSABS 0.1 09/02/2022   BASOSABS 0.0 46/27/0350     Last metabolic panel Lab Results  Component Value Date   NA 136 09/04/2022   K 3.6 09/04/2022   CL 105 09/04/2022   CO2  24 09/04/2022   BUN 12 09/04/2022   CREATININE 0.48 09/05/2022   GLUCOSE 107 (H) 09/04/2022   GFRNONAA >60 09/05/2022   GFRAA 100 09/17/2019   CALCIUM 7.8 (L) 09/04/2022   PROT 5.5 (  L) 09/04/2022   ALBUMIN 2.4 (L) 09/04/2022   BILITOT 0.6 09/04/2022   ALKPHOS 50 09/04/2022   AST 14 (L) 09/04/2022   ALT 19 09/04/2022   ANIONGAP 7 09/04/2022    CBG (last 3)  Recent Labs    09/04/22 2155 09/05/22 0736 09/05/22 1144  GLUCAP 199* 96 136*       Coagulation Profile: Recent Labs  Lab 09/02/22 1612 09/03/22 0502  INR 1.1 1.2      Radiology Studies: No results found.     Hosie Poisson M.D. [facility] 09/05/2022, 2:07 PM  Available via Epic secure chat 7am-7pm After 7 pm, please refer to night coverage provider listed on amion.

## 2022-09-05 NOTE — Evaluation (Signed)
Occupational Therapy Evaluation Patient Details Name: Laurie Mccoy MRN: [id number] DOB: [birth year] Today's Date: 09/05/2022   History of Present Illness irginia Mccoy is a 81 y.o. 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 Patient presented secondary to fever and shortness of breath. Patient admitted with accute respiratory failure with hypoxia probably secondary to left lower lobe pneumonia in the setting of non-small cell lung cancer with brain mets and COPD   Clinical Impression   Laurie Mccoy is a 81 year old woman typically independent with Rolator who now presents with decreased activity tolerance and impaired cardiopulmonary endurance on 2 L [place]. She is overall supervision for ADLs and ambulation with walker to monitor o2 sat and symptoms. She exhibits increased work of breathing with LB ADLs and ambulation in hall. Her o2 sat drops to 88% on 2 L [place] with activity. Patient will benefit from skilled OT services while in hospital to improve deficits and learn compensatory strategies as needed in order to return to PLOF.        Recommendations for follow up therapy are one component of a multi-disciplinary discharge planning process, led by the attending physician.  Recommendations may be updated based on patient status, additional functional criteria and insurance authorization.   Follow Up Recommendations  No OT follow up     Assistance Recommended at Discharge PRN  Patient can return home with the following Assistance with cooking/housework    Functional Status Assessment  Patient has had a recent decline in their functional status and demonstrates the ability to make significant improvements in function in a reasonable and predictable amount of time.  Equipment Recommendations  None recommended by OT    Recommendations for Other Services       Precautions / Restrictions  Precautions Precaution Comments: monitor o2 sats Restrictions Weight Bearing Restrictions: No      Mobility Bed Mobility Overal bed mobility: Modified Independent                  Transfers Overall transfer level: Needs assistance Equipment used: Rolling walker (2 wheels)               General transfer comment: Supervision for ambulation in room and hall x 50 feet with walker on 2 L       Balance Overall balance assessment: Mild deficits observed, not formally tested                                         ADL either performed or assessed with clinical judgement   ADL Overall ADL's : Needs assistance/impaired Eating/Feeding: Independent   Grooming: Supervision/safety   Upper Body Bathing: Supervision/ safety   Lower Body Bathing: Supervison/ safety   Upper Body Dressing : Supervision/safety   Lower Body Dressing: Supervision/safety Lower Body Dressing Details (indicate cue type and reason): increased wob with LB dressing. Toilet Transfer: Warehouse manager and Hygiene: Supervision/safety;Sit to/from stand       Functional mobility during ADLs: Supervision/safety;Rolling walker (2 wheels)       Vision Baseline Vision/History: 1 Wears glasses Patient Visual Report: No change from baseline       Perception     Praxis      Pertinent Vitals/Pain Pain Assessment Pain Assessment: No/denies pain     Hand Dominance Right   Extremity/Trunk Assessment Upper  Extremity Assessment Upper Extremity Assessment: Overall WFL for tasks assessed   Lower Extremity Assessment Lower Extremity Assessment: Defer to PT evaluation   Cervical / Trunk Assessment Cervical / Trunk Assessment: Normal   Communication Communication Communication: No difficulties   Cognition Arousal/Alertness: Awake/alert Behavior During Therapy: WFL for tasks assessed/performed Overall Cognitive Status: Within Functional  Limits for tasks assessed                                       General Comments       Exercises     Shoulder Instructions      Home Living Family/patient expects to be discharged to:: Private residence Living Arrangements: Spouse/significant other Available Help at Discharge: Family Type of Home: House Home Access: Ramped entrance     Home Layout: One level     Bathroom Shower/Tub: Hospital doctor Toilet: Handicapped height     Home Equipment: [place] - single point;Shower seat - built in;Rollator (4 wheels);Rolling Walker (2 wheels)   Additional Comments: Bidet and heated seat      Prior Functioning/Environment Prior Level of Function : Independent/Modified Independent             Mobility Comments: uses a rollator ADLs Comments: independent        OT Problem List: Decreased activity tolerance;Cardiopulmonary status limiting activity      OT Treatment/Interventions: Self-care/ADL training;Therapeutic exercise;DME and/or AE instruction;Therapeutic activities;Patient/family education;Energy conservation    OT Goals(Current goals can be found in the care plan section) Acute Rehab OT Goals Patient Stated Goal: to get off oxygen OT Goal Formulation: With patient Time For Goal Achievement: 09/19/22 Potential to Achieve Goals: Good  OT Frequency: Min 2X/week    Co-evaluation              AM-PAC OT "6 Clicks" Daily Activity     Outcome Measure Help from another person eating meals?: None Help from another person taking care of personal grooming?: A Little Help from another person toileting, which includes using toliet, bedpan, or urinal?: A Little Help from another person bathing (including washing, rinsing, drying)?: A Little Help from another person to put on and taking off regular upper body clothing?: A Little Help from another person to put on and taking off regular lower body clothing?: A Little 6 Click Score: 19   End  of Session Equipment Utilized During Treatment: Rolling walker (2 wheels);Oxygen Nurse Communication:  (okay to see)  Activity Tolerance: Patient tolerated treatment well Patient left: in chair;with call bell/phone within reach  OT Visit Diagnosis: Muscle weakness (generalized) (M62.81)                Time: 4010-2725 OT Time Calculation (min): 19 min Charges:  OT General Charges $OT Visit: 1 Visit OT Evaluation $OT Eval Low Complexity: 1 Low  Gustavo Lah, OTR/L [place]  Office [phone]   Lenward Chancellor 09/05/2022, 3:07 PM

## 2022-09-05 NOTE — Care Management Important Message (Signed)
Important Message  Patient Details IM Letter given. Name: Laurie Mccoy MRN: [id number] Date of Birth: 1940-11-22   Medicare Important Message Given:  Yes     Kerin Salen 09/05/2022, 10:57 AM

## 2022-09-05 NOTE — Progress Notes (Signed)
Pharmacy Antibiotic Note  Laurie Mccoy is a 81 y.o. female admitted on 09/02/2022 with LLL PNA in setting of NSCLC.  Pharmacy has been consulted for  cefepime dosing. Vancomycin stopped as MRSA PCR negative   Plan: Continue cefepime 2 g IV q8 hr Monitor clinical course, renal function, cultures as available      Temp (24hrs), Avg:98.4 F (36.9 C), Min:98 F (36.7 C), Max:98.8 F (37.1 C)  Recent Labs  Lab 09/02/22 1243 09/02/22 1612 09/03/22 0449 09/04/22 0438 09/05/22 0407  WBC 3.8* 3.9*  --  3.6*  --   CREATININE 0.49 0.51 0.52 0.49 0.48  LATICACIDVEN  --  1.5  --   --   --      Estimated Creatinine Clearance: 65.8 mL/min (by C-G formula based on SCr of 0.48 mg/dL).    Allergies  Allergen Reactions   Amoxicillin     itching   Compazine [Prochlorperazine]     Lose muscle control in face    Norvasc [Amlodipine] Swelling    Lower extremity swelling     Trelegy Ellipta [Fluticasone-Umeclidin-Vilant] Swelling    Swelling in legs and headache.    Antimicrobials this admission: 11/27 vancomycin >> 11/29  11/27 cefepime >>  11/27 Flagyl x 1  Dose adjustments this admission: N/a  Microbiology results: 11/29 MRSA PCR negative:  11/28: resp panel: negative  11/27 BCx: NGTD 11/21 UCx: 80k C. Freundii  Thank you for allowing pharmacy to be a part of this patient's care.  Royetta Asal, PharmD, BCPS 09/05/2022 12:30 PM

## 2022-09-06 ENCOUNTER — Inpatient Hospital Stay (HOSPITAL_COMMUNITY): Payer: Medicare Other

## 2022-09-06 ENCOUNTER — Telehealth: Payer: Self-pay

## 2022-09-06 ENCOUNTER — Inpatient Hospital Stay (HOSPITAL_COMMUNITY): Admission: RE | Admit: 2022-09-06 | Payer: Medicare Other | Source: Ambulatory Visit

## 2022-09-06 ENCOUNTER — Ambulatory Visit (HOSPITAL_COMMUNITY): Payer: Medicare Other

## 2022-09-06 ENCOUNTER — Encounter (HOSPITAL_COMMUNITY): Payer: Self-pay

## 2022-09-06 DIAGNOSIS — J441 Chronic obstructive pulmonary disease with (acute) exacerbation: Secondary | ICD-10-CM | POA: Diagnosis not present

## 2022-09-06 DIAGNOSIS — I10 Essential (primary) hypertension: Secondary | ICD-10-CM | POA: Diagnosis not present

## 2022-09-06 DIAGNOSIS — A419 Sepsis, unspecified organism: Secondary | ICD-10-CM | POA: Diagnosis not present

## 2022-09-06 DIAGNOSIS — J189 Pneumonia, unspecified organism: Secondary | ICD-10-CM | POA: Diagnosis not present

## 2022-09-06 LAB — COMPREHENSIVE METABOLIC PANEL
ALT: 23 U/L (ref 0–44)
AST: 17 U/L (ref 15–41)
Albumin: 2.5 g/dL — ABNORMAL LOW (ref 3.5–5.0)
Alkaline Phosphatase: 48 U/L (ref 38–126)
Anion gap: 8 (ref 5–15)
BUN: 16 mg/dL (ref 8–23)
CO2: 23 mmol/L (ref 22–32)
Calcium: 8 mg/dL — ABNORMAL LOW (ref 8.9–10.3)
Chloride: 108 mmol/L (ref 98–111)
Creatinine, Ser: 0.53 mg/dL (ref 0.44–1.00)
GFR, Estimated: >60 mL/min (ref >60)
Glucose, Bld: 129 mg/dL — ABNORMAL HIGH (ref 70–99)
Potassium: 3.4 mmol/L — ABNORMAL LOW (ref 3.5–5.1)
Sodium: 139 mmol/L (ref 135–145)
Total Bilirubin: 0.6 mg/dL (ref 0.3–1.2)
Total Protein: 5.3 g/dL — ABNORMAL LOW (ref 6.5–8.1)

## 2022-09-06 LAB — CBC WITH DIFFERENTIAL/PLATELET
Abs Immature Granulocytes: 0.03 10*3/uL (ref 0.00–0.07)
Basophils Absolute: 0 10*3/uL (ref 0.0–0.1)
Basophils Relative: 1 %
Eosinophils Absolute: 0.2 10*3/uL (ref 0.0–0.5)
Eosinophils Relative: 4 %
HCT: 33.3 % — ABNORMAL LOW (ref 36.0–46.0)
Hemoglobin: 10.6 g/dL — ABNORMAL LOW (ref 12.0–15.0)
Immature Granulocytes: 1 %
Lymphocytes Relative: 9 %
Lymphs Abs: 0.4 10*3/uL — ABNORMAL LOW (ref 0.7–4.0)
MCH: 31.2 pg (ref 26.0–34.0)
MCHC: 31.8 g/dL (ref 30.0–36.0)
MCV: 97.9 fL (ref 80.0–100.0)
Monocytes Absolute: 0.3 10*3/uL (ref 0.1–1.0)
Monocytes Relative: 8 %
Neutro Abs: 3.3 10*3/uL (ref 1.7–7.7)
Neutrophils Relative %: 77 %
Platelets: 262 10*3/uL (ref 150–400)
RBC: 3.4 MIL/uL — ABNORMAL LOW (ref 3.87–5.11)
RDW: 13.6 % (ref 11.5–15.5)
WBC: 4.2 10*3/uL (ref 4.0–10.5)
nRBC: 0 % (ref 0.0–0.2)

## 2022-09-06 LAB — GLUCOSE, CAPILLARY
Glucose-Capillary: 93 mg/dL (ref 70–99)
Glucose-Capillary: 119 mg/dL — ABNORMAL HIGH (ref 70–99)
Glucose-Capillary: 223 mg/dL — ABNORMAL HIGH (ref 70–99)
Glucose-Capillary: 193 mg/dL — ABNORMAL HIGH (ref 70–99)

## 2022-09-06 MED ORDER — POTASSIUM CHLORIDE CRYS ER 20 MEQ PO TBCR
40.0000 meq | EXTENDED_RELEASE_TABLET | Freq: Once | ORAL | Status: AC
  Administered 2022-09-06: 40 meq via ORAL
  Filled 2022-09-06: qty 2

## 2022-09-06 MED ORDER — IOHEXOL 300 MG/ML  SOLN
75.0000 mL | Freq: Once | INTRAMUSCULAR | Status: AC | PRN
  Administered 2022-09-06: 75 mL via INTRAVENOUS

## 2022-09-06 MED ORDER — SODIUM CHLORIDE (PF) 0.9 % IJ SOLN
INTRAMUSCULAR | Status: AC
  Filled 2022-09-06: qty 50

## 2022-09-06 NOTE — Telephone Encounter (Signed)
This nurse received a call this morning from [facility] in Radiology.  Stated that patient was scheduled for port placement this morning but he noticed that patient was admitted to hospital last night with Pneumonia.  This nurse advised that provider will be made aware.  No further questions or concerns at this time.

## 2022-09-06 NOTE — Progress Notes (Signed)
Pt said " leave me alone" She doesn't want to wear her cpap. Machine remained bedside.

## 2022-09-06 NOTE — Progress Notes (Addendum)
PT Cancellation Note  Patient Details Name: Laurie Mccoy MRN: [id number] DOB: 08-29-1941   Cancelled Treatment:    Reason Eval/Treat Not Completed: Fatigue/lethargy limiting ability to participate (requested PT to check back)   Kati L Payson 09/06/2022, 11:19 AM  Attempted to see again around 1240 and pt again requested PT to check back.  Will check back as schedule permits.  Arlyce Dice, DPT Physical Therapist Acute [facility] Services Preferred contact method: Secure Chat Weekend Pager Only: [phone] Office: [phone]

## 2022-09-06 NOTE — Telephone Encounter (Signed)
Spoke with pt and clarified confusion of pt being "discharged" form practice. Pt stated understanding. Nothing further needed at this time.

## 2022-09-06 NOTE — Progress Notes (Signed)
[facility]                                                                               Laurie Mccoy, is a 81 y.o. female, DOB - 1941/02/21, KPT:[id number] Admit date - 09/02/2022    Outpatient Primary MD for the patient is [name], NP  LOS - 4  days    Brief summary   Laurie Mccoy is a 81 y.o. 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 Patient presented secondary to fever and shortness of breath just before receiving infusion at cancer center. Initial imaging concerning for likely pneumonia. Associated hypoxia requiring supplemental oxygen. Empiric Vancomycin and Cefepime started on admission.   Assessment & Plan    Assessment and Plan:   Acute respiratory failure with hypoxia probably secondary to left lower lobe pneumonia in the setting of non-small cell lung cancer with brain mets and COPD She is currently required up to 2 L of nasal cannula oxygen to keep sats greater than 90%  she was started on broad-spectrum IV antibiotics and blood cultures were obtained.   Her COVID-19 and influenza PCR is negative RVP is negative so far. Follow blood cultures and sputum cultures. So far they  have been negative.  Continue with incentive spirometry and flutter valve along with a short course of steroids for acute bronchitis.  Continue with home inhalers, DuoNebs as needed. Repeated CXR as she reports sob overnight.  Repeat CXR  shows Stable left lung density is noted consistent with malignancy.  Follow up with CT chest with contrast.    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   She was scheduled to start chemotherapy on 09/02/2022. She is also scheduled for port placement next week.   Hypertension BP parameters are well controlled.  Continue home medications.    Type 2  diabetes mellitus CBG (last 3)  Well controlled CBG'S Recent Labs    09/05/22 2126 09/06/22 0734 09/06/22 1142  GLUCAP 127* 93 119*     Continue with Sliding scale insulin, hemoglobin A1c is 6.4%    Nonobstructive coronary artery disease Pt denies any chest pain or sob.     Anxiety Continue with Xanax   Sepsis present on admission secondary to left lower lobe pneumonia. Urine and blood cultures have been negative.    Anemia of chronic disease Hemoglobin stable around 10, continue to monitor.  Hypokalemia: replaced.     Estimated body mass index is 32.95 kg/m as calculated from the following:   Height as of this encounter: 5\' 6"  (1.676 m).   Weight as of this encounter: 92.6 kg. Code Status: full code.  DVT Prophylaxis:  enoxaparin (LOVENOX) injection 40 mg Start: 09/03/22 1000   Level of Care: Level of care: Progressive Family Communication: none at bedside.   Disposition Plan:     Remains inpatient appropriate:  IV antibiotics  Procedures:  None.   Consultants:   None.   Antimicrobials:   Anti-infectives (From admission, onward)    Start     Dose/Rate  Route Frequency Ordered Stop   09/03/22 0600  vancomycin (VANCOREADY) IVPB 1250 mg/250 mL  Status:  Discontinued        1,250 mg 166.7 mL/hr over 90 Minutes Intravenous Daily 09/02/22 2049 09/04/22 2051   09/02/22 2200  ceFEPIme (MAXIPIME) 2 g in sodium chloride 0.9 % 100 mL IVPB        2 g 200 mL/hr over 30 Minutes Intravenous Every 8 hours 09/02/22 2049 09/09/22 1359   09/02/22 1700  vancomycin (VANCOCIN) IVPB 1000 mg/200 mL premix        1,000 mg 200 mL/hr over 60 Minutes Intravenous  Once 09/02/22 1651 09/03/22 0725   09/02/22 1700  ceFEPIme (MAXIPIME) 2 g in sodium chloride 0.9 % 100 mL IVPB        2 g 200 mL/hr over 30 Minutes Intravenous  Once 09/02/22 1651 09/02/22 1740   09/02/22 1700  metroNIDAZOLE (FLAGYL) IVPB 500 mg        500 mg 100 mL/hr over 60 Minutes Intravenous  Once 09/02/22  1651 09/02/22 1741        Medications  Scheduled Meds:  Budeson-Glycopyrrol-Formoterol  2 puff Inhalation BID   enoxaparin (LOVENOX) injection  40 mg Subcutaneous E56D   folic acid  1 mg Oral Daily   guaiFENesin  600 mg Oral BID   insulin aspart  0-15 Units Subcutaneous TID WC   ipratropium-albuterol  3 mL Nebulization TID   irbesartan  75 mg Oral Daily   pantoprazole  20 mg Oral QPM   predniSONE  40 mg Oral QAC breakfast   sodium chloride (PF)       sucralfate  1 g Oral TID WC & HS   Continuous Infusions:  ceFEPime (MAXIPIME) IV 2 g (09/06/22 1433)   PRN Meds:.acetaminophen **OR** acetaminophen, albuterol, ALPRAZolam, ondansetron **OR** ondansetron (ZOFRAN) IV, sodium chloride (PF)    Subjective:   Laurie Mccoy was seen and examined today.  Cough is better , wheezing is better, still feels sob on ambulation.   Objective:   Vitals:   09/06/22 0904 09/06/22 1102 09/06/22 1147 09/06/22 1400  BP:   (!) 118/42   Pulse:   83   Resp:   (!) 22   Temp:   98.2 F (36.8 C)   TempSrc:   Oral   SpO2: 98% 93% 90% (!) 87%  Weight:      Height:        Intake/Output Summary (Last 24 hours) at 09/06/2022 1513 Last data filed at 09/06/2022 1497 Gross per 24 hour  Intake 520 ml  Output --  Net 520 ml    Filed Weights   09/06/22 0504  Weight: 92.6 kg     Exam General exam: Appears calm and comfortable  Respiratory system: Clear to auscultation. Respiratory effort normal. Cardiovascular system: S1 & S2 heard, RRR. No JVD,. No pedal edema. Gastrointestinal system: Abdomen is nondistended, soft and nontender. Central nervous system: Alert and oriented. No focal neurological deficits. Extremities: Symmetric 5 x 5 power. Skin: No rashes,  Psychiatry:  Mood & affect appropriate.    Data Reviewed:  I have personally reviewed following labs and imaging studies   CBC Lab Results  Component Value Date   WBC 4.2 09/06/2022   RBC 3.40 (L) 09/06/2022   HGB 10.6 (L)  09/06/2022   HCT 33.3 (L) 09/06/2022   MCV 97.9 09/06/2022   MCH 31.2 09/06/2022   PLT 262 09/06/2022   MCHC 31.8 09/06/2022   RDW 13.6 09/06/2022   LYMPHSABS  0.4 (L) 09/06/2022   MONOABS 0.3 09/06/2022   EOSABS 0.2 09/06/2022   BASOSABS 0.0 46/96/2952     Last metabolic panel Lab Results  Component Value Date   NA 139 09/06/2022   K 3.4 (L) 09/06/2022   CL 108 09/06/2022   CO2 23 09/06/2022   BUN 16 09/06/2022   CREATININE 0.53 09/06/2022   GLUCOSE 129 (H) 09/06/2022   GFRNONAA >60 09/06/2022   GFRAA 100 09/17/2019   CALCIUM 8.0 (L) 09/06/2022   PROT 5.3 (L) 09/06/2022   ALBUMIN 2.5 (L) 09/06/2022   BILITOT 0.6 09/06/2022   ALKPHOS 48 09/06/2022   AST 17 09/06/2022   ALT 23 09/06/2022   ANIONGAP 8 09/06/2022    CBG (last 3)  Recent Labs    09/05/22 2126 09/06/22 0734 09/06/22 1142  GLUCAP 127* 93 119*       Coagulation Profile: Recent Labs  Lab 09/02/22 1612 09/03/22 0502  INR 1.1 1.2      Radiology Studies: DG Chest 2 View  Result Date: 09/06/2022 CLINICAL DATA:  History of lung cancer. EXAM: CHEST - 2 VIEW COMPARISON:  September 02, 2022.  July 04, 2022. FINDINGS: Stable cardiomediastinal silhouette. Right lung is clear. Stable left lung density is noted consistent with malignancy. Bony thorax is unremarkable. IMPRESSION: Stable left lung density is noted consistent with malignancy. Electronically Signed   By: Marijo Conception M.D.   On: 09/06/2022 10:07       Hosie Poisson M.D. [facility] 09/06/2022, 3:13 PM  Available via Epic secure chat 7am-7pm After 7 pm, please refer to night coverage provider listed on amion.

## 2022-09-07 DIAGNOSIS — I10 Essential (primary) hypertension: Secondary | ICD-10-CM | POA: Diagnosis not present

## 2022-09-07 DIAGNOSIS — J441 Chronic obstructive pulmonary disease with (acute) exacerbation: Secondary | ICD-10-CM | POA: Diagnosis not present

## 2022-09-07 DIAGNOSIS — A419 Sepsis, unspecified organism: Secondary | ICD-10-CM | POA: Diagnosis not present

## 2022-09-07 DIAGNOSIS — J189 Pneumonia, unspecified organism: Secondary | ICD-10-CM | POA: Diagnosis not present

## 2022-09-07 LAB — CULTURE, BLOOD (ROUTINE X 2)
Culture: NO GROWTH
Special Requests: ADEQUATE

## 2022-09-07 LAB — GLUCOSE, CAPILLARY
Glucose-Capillary: 118 mg/dL — ABNORMAL HIGH (ref 70–99)
Glucose-Capillary: 125 mg/dL — ABNORMAL HIGH (ref 70–99)
Glucose-Capillary: 151 mg/dL — ABNORMAL HIGH (ref 70–99)
Glucose-Capillary: 191 mg/dL — ABNORMAL HIGH (ref 70–99)

## 2022-09-07 MED ORDER — FUROSEMIDE 10 MG/ML IJ SOLN
20.0000 mg | Freq: Once | INTRAMUSCULAR | Status: AC
  Administered 2022-09-07: 20 mg via INTRAVENOUS
  Filled 2022-09-07: qty 2

## 2022-09-07 MED ORDER — PREDNISONE 50 MG PO TABS
60.0000 mg | ORAL_TABLET | Freq: Every day | ORAL | Status: DC
  Administered 2022-09-07 – 2022-09-09 (×3): 60 mg via ORAL
  Filled 2022-09-07 (×3): qty 1

## 2022-09-07 MED ORDER — ALPRAZOLAM 0.5 MG PO TABS
0.5000 mg | ORAL_TABLET | Freq: Every day | ORAL | Status: DC
  Administered 2022-09-07 – 2022-09-08 (×2): 0.5 mg via ORAL
  Filled 2022-09-07 (×2): qty 1

## 2022-09-07 NOTE — Progress Notes (Signed)
[facility]                                                                               Laurie Mccoy, is a 81 y.o. female, DOB - Feb 11, 1941, ZRA:[id number] Admit date - 09/02/2022    Outpatient Primary MD for the patient is [name], NP  LOS - 5  days    Brief summary   Laurie Mccoy is a 81 y.o. 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 Patient presented secondary to fever and shortness of breath just before receiving infusion at cancer center. Initial imaging concerning for likely pneumonia. Associated hypoxia requiring supplemental oxygen. Empiric Vancomycin and Cefepime started on admission.   Assessment & Plan    Assessment and Plan:   Acute respiratory failure with hypoxia probably secondary to left lower lobe pneumonia in the setting of non-small cell lung cancer with brain mets and COPD She is currently required up to 2 L of nasal cannula oxygen to keep sats greater than 90%  she was started on broad-spectrum IV antibiotics and blood cultures were obtained. Completed 5 days of IV cefepime.   Her COVID-19 and influenza PCR is negative RVP is negative so far. Follow blood cultures and sputum cultures. So far they  have been negative.  Continue with incentive spirometry and flutter valve along with a short course of steroids for acute bronchitis.  Continue with home inhalers, DuoNebs as needed. CT chest ordered as pt reports feeling miserable, and she reports some dyspnea and unabel to wean her off the oxygen.  CT chest  shows  Patchy ground-glass in the LEFT upper lobe and to a lesser extent in the RIGHT upper lobe as developed since previous imaging. Findings are nonspecific but could be related to recent radiotherapy or post treatment related pneumonitis. In view of small pleural effusion we will go ahead with small dose of IV lasix and watch for improvement in her  breathing.    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   She was scheduled to start chemotherapy on 09/02/2022. She is also scheduled for port placement next week.   Hypertension BP parameters are optimal.  Continue home medications.    Type 2 diabetes mellitus CBG (last 3)  Well controlled CBG'S Recent Labs    09/06/22 1944 09/07/22 0802 09/07/22 1206  GLUCAP 193* 118* 125*     Continue with Sliding scale insulin, hemoglobin A1c is 6.4%    Nonobstructive coronary artery disease Pt denies any chest pain or sob.     Anxiety Continue with Xanax   Sepsis present on admission secondary to left lower lobe pneumonia. Urine and blood cultures have been negative.    Anemia of chronic disease Hemoglobin stable around 10, continue to monitor.  Hypokalemia: replaced.     Estimated body mass index is 32.95 kg/m as calculated from the following:   Height as of this encounter: 5\' 6"  (1.676 m).   Weight as of this encounter: 92.6 kg. Code Status: full code.  DVT Prophylaxis:  enoxaparin (  LOVENOX) injection 40 mg Start: 09/03/22 1000   Level of Care: Level of care: Med-Surg Family Communication: none at bedside.   Disposition Plan:     Remains inpatient appropriate:  IV antibiotics  Procedures:  None.   Consultants:   None.   Antimicrobials:   Anti-infectives (From admission, onward)    Start     Dose/Rate Route Frequency Ordered Stop   09/03/22 0600  vancomycin (VANCOREADY) IVPB 1250 mg/250 mL  Status:  Discontinued        1,250 mg 166.7 mL/hr over 90 Minutes Intravenous Daily 09/02/22 2049 09/04/22 2051   09/02/22 2200  ceFEPIme (MAXIPIME) 2 g in sodium chloride 0.9 % 100 mL IVPB        2 g 200 mL/hr over 30 Minutes Intravenous Every 8 hours 09/02/22 2049 09/09/22 1359   09/02/22 1700  vancomycin (VANCOCIN) IVPB 1000 mg/200 mL premix        1,000  mg 200 mL/hr over 60 Minutes Intravenous  Once 09/02/22 1651 09/03/22 0725   09/02/22 1700  ceFEPIme (MAXIPIME) 2 g in sodium chloride 0.9 % 100 mL IVPB        2 g 200 mL/hr over 30 Minutes Intravenous  Once 09/02/22 1651 09/02/22 1740   09/02/22 1700  metroNIDAZOLE (FLAGYL) IVPB 500 mg        500 mg 100 mL/hr over 60 Minutes Intravenous  Once 09/02/22 1651 09/02/22 1741        Medications  Scheduled Meds:  ALPRAZolam  0.5 mg Oral QHS   Budeson-Glycopyrrol-Formoterol  2 puff Inhalation BID   enoxaparin (LOVENOX) injection  40 mg Subcutaneous E08X   folic acid  1 mg Oral Daily   guaiFENesin  600 mg Oral BID   insulin aspart  0-15 Units Subcutaneous TID WC   ipratropium-albuterol  3 mL Nebulization TID   irbesartan  75 mg Oral Daily   pantoprazole  20 mg Oral QPM   predniSONE  60 mg Oral Q breakfast   sucralfate  1 g Oral TID WC & HS   Continuous Infusions:  ceFEPime (MAXIPIME) IV 2 g (09/07/22 1314)   PRN Meds:.acetaminophen **OR** acetaminophen, albuterol, ondansetron **OR** ondansetron (ZOFRAN) IV    Subjective:   Laurie Mccoy was seen and examined today.  Reports that she hasn't slept very good.   Objective:   Vitals:   09/07/22 0633 09/07/22 0822 09/07/22 1245 09/07/22 1530  BP: 133/78  137/61   Pulse: 77 81 82   Resp:  20 19   Temp: 98.7 F (37.1 C)  98.3 F (36.8 C)   TempSrc: Oral     SpO2: 98% 98% 92% 95%  Weight:      Height:        Intake/Output Summary (Last 24 hours) at 09/07/2022 1602 Last data filed at 09/07/2022 1539 Gross per 24 hour  Intake 550.06 ml  Output --  Net 550.06 ml    Filed Weights   09/06/22 0504  Weight: 92.6 kg     Exam General exam: Appears calm and comfortable  Respiratory system: diminished air entry at bases, no rhonchi. on 2 lit of [place] oxygen.  Cardiovascular system: S1 & S2 heard, RRR. No JVD,  No pedal edema. Gastrointestinal system: Abdomen is nondistended, soft and nontender.  Normal bowel sounds  heard. Central nervous system: Alert and oriented. No focal neurological deficits. Extremities: Symmetric 5 x 5 power. Skin: No rashes, lesions or ulcers Psychiatry:  Mood & affect appropriate.      Data  Reviewed:  I have personally reviewed following labs and imaging studies   CBC Lab Results  Component Value Date   WBC 4.2 09/06/2022   RBC 3.40 (L) 09/06/2022   HGB 10.6 (L) 09/06/2022   HCT 33.3 (L) 09/06/2022   MCV 97.9 09/06/2022   MCH 31.2 09/06/2022   PLT 262 09/06/2022   MCHC 31.8 09/06/2022   RDW 13.6 09/06/2022   LYMPHSABS 0.4 (L) 09/06/2022   MONOABS 0.3 09/06/2022   EOSABS 0.2 09/06/2022   BASOSABS 0.0 38/18/2993     Last metabolic panel Lab Results  Component Value Date   NA 139 09/06/2022   K 3.4 (L) 09/06/2022   CL 108 09/06/2022   CO2 23 09/06/2022   BUN 16 09/06/2022   CREATININE 0.53 09/06/2022   GLUCOSE 129 (H) 09/06/2022   GFRNONAA >60 09/06/2022   GFRAA 100 09/17/2019   CALCIUM 8.0 (L) 09/06/2022   PROT 5.3 (L) 09/06/2022   ALBUMIN 2.5 (L) 09/06/2022   BILITOT 0.6 09/06/2022   ALKPHOS 48 09/06/2022   AST 17 09/06/2022   ALT 23 09/06/2022   ANIONGAP 8 09/06/2022    CBG (last 3)  Recent Labs    09/06/22 1944 09/07/22 0802 09/07/22 1206  GLUCAP 193* 118* 125*       Coagulation Profile: Recent Labs  Lab 09/02/22 1612 09/03/22 0502  INR 1.1 1.2      Radiology Studies: CT CHEST W CONTRAST  Result Date: 09/06/2022 CLINICAL DATA:  History of suspected pneumonia. Also with history of non-small cell lung cancer. * Tracking Code: BO * EXAM: CT CHEST WITH CONTRAST TECHNIQUE: Multidetector CT imaging of the chest was performed during intravenous contrast administration. RADIATION DOSE REDUCTION: This exam was performed according to the departmental dose-optimization program which includes automated exposure control, adjustment of the mA and/or kV according to patient size and/or use of iterative reconstruction technique. CONTRAST:  .  74mL OMNIPAQUE IOHEXOL 300 MG/ML  SOLN COMPARISON:  July 04, 2022 FINDINGS: Cardiovascular: Calcified aortic atherosclerotic changes. Noncalcified plaque as well. No signs of aneurysmal dilation of the thoracic aorta. Normal heart size without pericardial effusion or nodularity Central pulmonary vasculature is unremarkable to the extent evaluated on this venous phase assessment. Mediastinum/Nodes: No signs of adenopathy in the chest. Esophagus grossly normal. Lungs/Pleura: Patchy ground-glass in the LEFT upper lobe and to a lesser extent in the RIGHT upper lobe as developed since previous imaging. Mass along the LEFT hilum abutting the major fissure is diminished in size now 2.0 x 1.3 cm previously 3.5 x 3.0 cm. Fiducial markers in the RIGHT upper lobe in the central portion of the subsolid nodule, area of also diminished in size measuring 12 mm as compared to 15 mm on the prior study. No signs of new nodule. Pulmonary emphysema is similar to prior imaging. There has been interval development also of a small LEFT-sided pleural effusion. Mild lingular scarring or consolidation is similar to prior imaging. Upper Abdomen: Post cholecystectomy. Signs of hepatic steatosis. Visualized liver otherwise unremarkable. Pancreas, spleen, adrenal glands and kidneys unremarkable aside from pancreatic atrophy to the extent evaluated. No acute gastrointestinal findings in the upper abdomen. Musculoskeletal: No acute bone finding. No destructive bone process. Spinal degenerative changes. IMPRESSION: 1. Patchy ground-glass in the LEFT upper lobe and to a lesser extent in the RIGHT upper lobe as developed since previous imaging. Findings are nonspecific but could be related to recent radiotherapy or post treatment related pneumonitis. Correlate with respiratory symptoms and with any evidence of viral infection.  No signs of classic lobar type pneumonia. 2. Interval development of a small LEFT-sided pleural effusion potentially  related to above findings. 3. Diminished size of LEFT juxta hilar and Peri fissural mass in the LEFT chest compatible with response to therapy. 4. Diminishing size albeit slight decrease of a subsolid nodule in the RIGHT upper lobe. 5. Pulmonary emphysema is similar to prior imaging. 6. Signs of hepatic steatosis. 7. Post cholecystectomy. 8. Aortic atherosclerosis. Aortic Atherosclerosis (ICD10-I70.0) and Emphysema (ICD10-J43.9). Electronically Signed   By: Zetta Bills M.D.   On: 09/06/2022 16:43   DG Chest 2 View  Result Date: 09/06/2022 CLINICAL DATA:  History of lung cancer. EXAM: CHEST - 2 VIEW COMPARISON:  September 02, 2022.  July 04, 2022. FINDINGS: Stable cardiomediastinal silhouette. Right lung is clear. Stable left lung density is noted consistent with malignancy. Bony thorax is unremarkable. IMPRESSION: Stable left lung density is noted consistent with malignancy. Electronically Signed   By: Marijo Conception M.D.   On: 09/06/2022 10:07       Hosie Poisson M.D. [facility] 09/07/2022, 4:02 PM  Available via Epic secure chat 7am-7pm After 7 pm, please refer to night coverage provider listed on amion.

## 2022-09-07 NOTE — Evaluation (Signed)
Physical Therapy Evaluation Patient Details Name: Laurie Mccoy MRN: [id number] DOB: Dec 28, 1940 Today's Date: 09/07/2022  History of Present Illness  irginia Mccoy is a 81 y.o. 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 Patient presented secondary to fever and shortness of breath. 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 Pt had had shower with nursing staff earlier and then multiple visitors, and politely declined walking  but motivated to walk later. [place] pt would be appropriate for nursing and mobility staff to ambulate. Pt does exhibit weakness and feel she would benefit from HHPT to increase strength and safety at home as is her goal.      Recommendations for follow up therapy are one component of a multi-disciplinary discharge planning process, led by the attending physician.  Recommendations may be updated based on patient status, additional functional criteria and insurance authorization.  Follow Up Recommendations Home health PT      Assistance Recommended at Discharge Intermittent Supervision/Assistance  Patient can return home with the following  A little help with walking and/or transfers;A little help with bathing/dressing/bathroom;Assistance with cooking/housework;Help with stairs or ramp for entrance    Equipment Recommendations None recommended by PT  Recommendations for Other Services       Functional Status Assessment Patient has had a recent decline in their functional status and demonstrates the ability to make significant improvements in function in a reasonable and predictable amount of time.     Precautions / Restrictions Precautions Precaution Comments: monitor o2 sats Restrictions Weight Bearing  Restrictions: No      Mobility  Bed Mobility Overal bed mobility: Needs Assistance Bed Mobility: Sit to Supine       Sit to supine: Min assist   General bed mobility comments: with LEs getting them back into bed    Transfers Overall transfer level: Needs assistance Equipment used: None Transfers: Bed to chair/wheelchair/BSC     Step pivot transfers: Min assist       General transfer comment: pt very fatigued from visitors and shower earlier, had to have assit for stand pivot hand held assist for 5-7 steps to the bed.    Ambulation/Gait Ambulation/Gait assistance:  (unable to today however see she has and she woudl like to walk this evening with nursing assisting. Pt motivated buut very fatigued at tbe moment)                Financial trader Rankin (Stroke Patients Only)       Balance Overall balance assessment: Needs assistance Sitting-balance support: Bilateral upper extremity supported, Feet supported Sitting balance-Laurie Mccoy Scale: Normal     Standing balance support: Single extremity supported, During functional activity Standing balance-Laurie Mccoy Scale: Fair                               Pertinent Vitals/Pain Pain Assessment Pain Assessment: No/denies pain    Home Living Family/patient expects to be discharged to:: Private residence Living Arrangements: Spouse/significant other Available Help at Discharge: Family Type of Home: House Home Access: Ramped entrance       Home Layout: One level Home Equipment: Cane - single point;Shower seat - built  in;Rollator (4 wheels);Rolling Walker (2 wheels) Additional Comments: Bidet and heated seat    Prior Function Prior Level of Function : Independent/Modified Independent             Mobility Comments: uses a rollator ADLs Comments: independent     Hand Dominance   Dominant Hand: Right    Extremity/Trunk Assessment        Lower  Extremity Assessment Lower Extremity Assessment: Generalized weakness (pt has very little energy and strength throughout the day , but able to perform basic functions just with more effort.)    Cervical / Trunk Assessment Cervical / Trunk Assessment: Normal  Communication   Communication: No difficulties  Cognition Arousal/Alertness: Awake/alert Behavior During Therapy: WFL for tasks assessed/performed Overall Cognitive Status: Within Functional Limits for tasks assessed                                          General Comments      Exercises     Assessment/Plan    PT Assessment Patient needs continued PT services  PT Problem List Decreased strength;Decreased activity tolerance;Decreased mobility       PT Treatment Interventions Gait training;Functional mobility training;Therapeutic activities;Therapeutic exercise;Patient/family education    PT Goals (Current goals can be found in the Care Plan section)  Acute Rehab PT Goals Patient Stated Goal: I want to get stronger and head back home PT Goal Formulation: With patient Time For Goal Achievement: 09/21/22 Potential to Achieve Goals: Good    Frequency Min 3X/week     Co-evaluation               AM-PAC PT "6 Clicks" Mobility  Outcome Measure Help needed turning from your back to your side while in a flat bed without using bedrails?: A Little Help needed moving from lying on your back to sitting on the side of a flat bed without using bedrails?: A Little Help needed moving to and from a bed to a chair (including a wheelchair)?: A Little Help needed standing up from a chair using your arms (e.g., wheelchair or bedside chair)?: A Little Help needed to walk in hospital room?: A Little Help needed climbing 3-5 steps with a railing? : A Little 6 Click Score: 18    End of Session   Activity Tolerance: Patient limited by fatigue Patient left: in bed;with bed alarm set;with call bell/phone within  reach Nurse Communication: Mobility status PT Visit Diagnosis: Other abnormalities of gait and mobility (R26.89);Muscle weakness (generalized) (M62.81)    Time: 9604-5409 PT Time Calculation (min) (ACUTE ONLY): 17 min   Charges:   PT Evaluation $PT Eval Low Complexity: 1 Low          Kennis Wissmann, PT, MPT Acute Rehabilitation Services Office: [phone] If a weekend: [facility] w/e pager [phone] 09/07/2022   Clide Dales 09/07/2022, 5:05 PM

## 2022-09-08 DIAGNOSIS — A419 Sepsis, unspecified organism: Secondary | ICD-10-CM | POA: Diagnosis not present

## 2022-09-08 DIAGNOSIS — J189 Pneumonia, unspecified organism: Secondary | ICD-10-CM | POA: Diagnosis not present

## 2022-09-08 DIAGNOSIS — J441 Chronic obstructive pulmonary disease with (acute) exacerbation: Secondary | ICD-10-CM | POA: Diagnosis not present

## 2022-09-08 DIAGNOSIS — I10 Essential (primary) hypertension: Secondary | ICD-10-CM | POA: Diagnosis not present

## 2022-09-08 LAB — CBC WITH DIFFERENTIAL/PLATELET
Abs Immature Granulocytes: 0.04 10*3/uL (ref 0.00–0.07)
Basophils Absolute: 0 10*3/uL (ref 0.0–0.1)
Basophils Relative: 1 %
Eosinophils Absolute: 0 10*3/uL (ref 0.0–0.5)
Eosinophils Relative: 1 %
HCT: 34.6 % — ABNORMAL LOW (ref 36.0–46.0)
Hemoglobin: 11.1 g/dL — ABNORMAL LOW (ref 12.0–15.0)
Immature Granulocytes: 1 %
Lymphocytes Relative: 14 %
Lymphs Abs: 0.5 10*3/uL — ABNORMAL LOW (ref 0.7–4.0)
MCH: 31.3 pg (ref 26.0–34.0)
MCHC: 32.1 g/dL (ref 30.0–36.0)
MCV: 97.5 fL (ref 80.0–100.0)
Monocytes Absolute: 0.3 10*3/uL (ref 0.1–1.0)
Monocytes Relative: 8 %
Neutro Abs: 2.7 10*3/uL (ref 1.7–7.7)
Neutrophils Relative %: 75 %
Platelets: 270 10*3/uL (ref 150–400)
RBC: 3.55 MIL/uL — ABNORMAL LOW (ref 3.87–5.11)
RDW: 13.2 % (ref 11.5–15.5)
WBC: 3.6 10*3/uL — ABNORMAL LOW (ref 4.0–10.5)
nRBC: 0 % (ref 0.0–0.2)

## 2022-09-08 LAB — GLUCOSE, CAPILLARY
Glucose-Capillary: 83 mg/dL (ref 70–99)
Glucose-Capillary: 139 mg/dL — ABNORMAL HIGH (ref 70–99)
Glucose-Capillary: 247 mg/dL — ABNORMAL HIGH (ref 70–99)
Glucose-Capillary: 176 mg/dL — ABNORMAL HIGH (ref 70–99)

## 2022-09-08 LAB — BASIC METABOLIC PANEL
Anion gap: 6 (ref 5–15)
BUN: 14 mg/dL (ref 8–23)
CO2: 29 mmol/L (ref 22–32)
Calcium: 8.5 mg/dL — ABNORMAL LOW (ref 8.9–10.3)
Chloride: 106 mmol/L (ref 98–111)
Creatinine, Ser: 0.51 mg/dL (ref 0.44–1.00)
GFR, Estimated: >60 mL/min (ref >60)
Glucose, Bld: 118 mg/dL — ABNORMAL HIGH (ref 70–99)
Potassium: 4.2 mmol/L (ref 3.5–5.1)
Sodium: 141 mmol/L (ref 135–145)

## 2022-09-08 NOTE — Progress Notes (Signed)
[facility]                                                                               Laurie Mccoy, is a 81 y.o. female, DOB - 02/26/1941, GYF:[id number] Admit date - 09/02/2022    Outpatient Primary MD for the patient is [name], NP  LOS - 6  days    Brief summary   Laurie Mccoy is a 81 y.o. 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 Patient presented secondary to fever and shortness of breath just before receiving infusion at cancer center. Initial imaging concerning for likely pneumonia. Associated hypoxia requiring supplemental oxygen. Empiric Vancomycin and Cefepime started on admission.   Assessment & Plan    Assessment and Plan:   Acute respiratory failure with hypoxia probably secondary to left lower lobe pneumonia in the setting of non-small cell lung cancer with brain mets and COPD She is currently required up to 2 L of nasal cannula oxygen to keep sats greater than 90%  she was started on broad-spectrum IV antibiotics and blood cultures were obtained. Completed 5 days of IV cefepime.   Her COVID-19 and influenza PCR is negative RVP is negative so far. Follow blood cultures and sputum cultures. So far they  have been negative.  Continue with incentive spirometry and flutter valve along with a short course of steroids for acute bronchitis.  Continue with home inhalers, DuoNebs as needed. CT chest ordered as pt reports feeling miserable, and she reports some dyspnea and unabel to wean her off the oxygen.  CT chest  shows  Patchy ground-glass in the LEFT upper lobe and to a lesser extent in the RIGHT upper lobe as developed since previous imaging. Findings are nonspecific but could be related to recent radiotherapy or post treatment related pneumonitis. In view of small pleural effusion we will go ahead with small dose of IV lasix and watch for improvement in her  breathing.  Ambulating oxygen levels checked and she requires about 2 lit of [place] oxygen.    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   She was scheduled to start chemotherapy on 09/02/2022. She is also scheduled for port placement next week.   Hypertension BP parameters are well controlled.  Continue home medications.    Type 2 diabetes mellitus CBG (last 3)  Well controlled CBG'S Recent Labs    09/07/22 2036 09/08/22 0756 09/08/22 1207  GLUCAP 191* 83 139*     Continue with Sliding scale insulin, hemoglobin A1c is 6.4%    Nonobstructive coronary artery disease Pt denies any chest pain or sob.     Anxiety Continue with Xanax   Sepsis present on admission secondary to left lower lobe pneumonia. Urine and blood cultures have been negative.    Anemia of chronic disease Hemoglobin around 11.   Hypokalemia: replaced.     Estimated body mass index is 32.95 kg/m as calculated from the following:   Height as of this encounter: 5\' 6"  (1.676 m).   Weight as of this  encounter: 92.6 kg. Code Status: full code.  DVT Prophylaxis:  enoxaparin (LOVENOX) injection 40 mg Start: 09/03/22 1000   Level of Care: Level of care: Med-Surg Family Communication: none at bedside.   Disposition Plan:     Remains inpatient appropriate:  IV antibiotics  Procedures:  None.   Consultants:   None.   Antimicrobials:   Anti-infectives (From admission, onward)    Start     Dose/Rate Route Frequency Ordered Stop   09/03/22 0600  vancomycin (VANCOREADY) IVPB 1250 mg/250 mL  Status:  Discontinued        1,250 mg 166.7 mL/hr over 90 Minutes Intravenous Daily 09/02/22 2049 09/04/22 2051   09/02/22 2200  ceFEPIme (MAXIPIME) 2 g in sodium chloride 0.9 % 100 mL IVPB  Status:  Discontinued        2 g 200 mL/hr over 30 Minutes Intravenous Every 8 hours 09/02/22 2049 09/07/22 1602    09/02/22 1700  vancomycin (VANCOCIN) IVPB 1000 mg/200 mL premix        1,000 mg 200 mL/hr over 60 Minutes Intravenous  Once 09/02/22 1651 09/03/22 0725   09/02/22 1700  ceFEPIme (MAXIPIME) 2 g in sodium chloride 0.9 % 100 mL IVPB        2 g 200 mL/hr over 30 Minutes Intravenous  Once 09/02/22 1651 09/02/22 1740   09/02/22 1700  metroNIDAZOLE (FLAGYL) IVPB 500 mg        500 mg 100 mL/hr over 60 Minutes Intravenous  Once 09/02/22 1651 09/02/22 1741        Medications  Scheduled Meds:  ALPRAZolam  0.5 mg Oral QHS   Budeson-Glycopyrrol-Formoterol  2 puff Inhalation BID   enoxaparin (LOVENOX) injection  40 mg Subcutaneous W40X   folic acid  1 mg Oral Daily   guaiFENesin  600 mg Oral BID   insulin aspart  0-15 Units Subcutaneous TID WC   ipratropium-albuterol  3 mL Nebulization TID   irbesartan  75 mg Oral Daily   pantoprazole  20 mg Oral QPM   predniSONE  60 mg Oral Q breakfast   sucralfate  1 g Oral TID WC & HS   Continuous Infusions:   PRN Meds:.acetaminophen **OR** acetaminophen, albuterol, ondansetron **OR** ondansetron (ZOFRAN) IV    Subjective:   Laurie Mccoy was seen and examined today.  No new complaints.   Objective:   Vitals:   09/08/22 0758 09/08/22 1020 09/08/22 1226 09/08/22 1310  BP:   (!) 150/56   Pulse:   87   Resp:   (!) 22   Temp:   98.7 F (37.1 C)   TempSrc:   Oral   SpO2: 90% 92% 92% 92%  Weight:      Height:        Intake/Output Summary (Last 24 hours) at 09/08/2022 1416 Last data filed at 09/08/2022 0900 Gross per 24 hour  Intake 452.23 ml  Output 2500 ml  Net -2047.77 ml    Filed Weights   09/06/22 0504  Weight: 92.6 kg     Exam General exam: Appears calm and comfortable  Respiratory system: air entry fair. On 2 lit of [place] oxygen.  Cardiovascular system: S1 & S2 heard, RRR. No JVD, . No pedal edema. Gastrointestinal system: Abdomen is nondistended, soft and nontender. Normal bowel sounds heard. Central nervous system:  Alert and oriented. No focal neurological deficits. Extremities: Symmetric 5 x 5 power. Skin: No rashes, lesions or ulcers Psychiatry: Mood & affect appropriate.       Data Reviewed:  I have personally reviewed following labs and imaging studies   CBC Lab Results  Component Value Date   WBC 3.6 (L) 09/08/2022   RBC 3.55 (L) 09/08/2022   HGB 11.1 (L) 09/08/2022   HCT 34.6 (L) 09/08/2022   MCV 97.5 09/08/2022   MCH 31.3 09/08/2022   PLT 270 09/08/2022   MCHC 32.1 09/08/2022   RDW 13.2 09/08/2022   LYMPHSABS 0.5 (L) 09/08/2022   MONOABS 0.3 09/08/2022   EOSABS 0.0 09/08/2022   BASOSABS 0.0 96/29/5284     Last metabolic panel Lab Results  Component Value Date   NA 141 09/08/2022   K 4.2 09/08/2022   CL 106 09/08/2022   CO2 29 09/08/2022   BUN 14 09/08/2022   CREATININE 0.51 09/08/2022   GLUCOSE 118 (H) 09/08/2022   GFRNONAA >60 09/08/2022   GFRAA 100 09/17/2019   CALCIUM 8.5 (L) 09/08/2022   PROT 5.3 (L) 09/06/2022   ALBUMIN 2.5 (L) 09/06/2022   BILITOT 0.6 09/06/2022   ALKPHOS 48 09/06/2022   AST 17 09/06/2022   ALT 23 09/06/2022   ANIONGAP 6 09/08/2022    CBG (last 3)  Recent Labs    09/07/22 2036 09/08/22 0756 09/08/22 1207  GLUCAP 191* 83 139*       Coagulation Profile: Recent Labs  Lab 09/02/22 1612 09/03/22 0502  INR 1.1 1.2      Radiology Studies: CT CHEST W CONTRAST  Result Date: 09/06/2022 CLINICAL DATA:  History of suspected pneumonia. Also with history of non-small cell lung cancer. * Tracking Code: BO * EXAM: CT CHEST WITH CONTRAST TECHNIQUE: Multidetector CT imaging of the chest was performed during intravenous contrast administration. RADIATION DOSE REDUCTION: This exam was performed according to the departmental dose-optimization program which includes automated exposure control, adjustment of the mA and/or kV according to patient size and/or use of iterative reconstruction technique. CONTRAST:  . 37mL OMNIPAQUE IOHEXOL 300 MG/ML   SOLN COMPARISON:  July 04, 2022 FINDINGS: Cardiovascular: Calcified aortic atherosclerotic changes. Noncalcified plaque as well. No signs of aneurysmal dilation of the thoracic aorta. Normal heart size without pericardial effusion or nodularity Central pulmonary vasculature is unremarkable to the extent evaluated on this venous phase assessment. Mediastinum/Nodes: No signs of adenopathy in the chest. Esophagus grossly normal. Lungs/Pleura: Patchy ground-glass in the LEFT upper lobe and to a lesser extent in the RIGHT upper lobe as developed since previous imaging. Mass along the LEFT hilum abutting the major fissure is diminished in size now 2.0 x 1.3 cm previously 3.5 x 3.0 cm. Fiducial markers in the RIGHT upper lobe in the central portion of the subsolid nodule, area of also diminished in size measuring 12 mm as compared to 15 mm on the prior study. No signs of new nodule. Pulmonary emphysema is similar to prior imaging. There has been interval development also of a small LEFT-sided pleural effusion. Mild lingular scarring or consolidation is similar to prior imaging. Upper Abdomen: Post cholecystectomy. Signs of hepatic steatosis. Visualized liver otherwise unremarkable. Pancreas, spleen, adrenal glands and kidneys unremarkable aside from pancreatic atrophy to the extent evaluated. No acute gastrointestinal findings in the upper abdomen. Musculoskeletal: No acute bone finding. No destructive bone process. Spinal degenerative changes. IMPRESSION: 1. Patchy ground-glass in the LEFT upper lobe and to a lesser extent in the RIGHT upper lobe as developed since previous imaging. Findings are nonspecific but could be related to recent radiotherapy or post treatment related pneumonitis. Correlate with respiratory symptoms and with any evidence of viral infection. No signs  of classic lobar type pneumonia. 2. Interval development of a small LEFT-sided pleural effusion potentially related to above findings. 3.  Diminished size of LEFT juxta hilar and Peri fissural mass in the LEFT chest compatible with response to therapy. 4. Diminishing size albeit slight decrease of a subsolid nodule in the RIGHT upper lobe. 5. Pulmonary emphysema is similar to prior imaging. 6. Signs of hepatic steatosis. 7. Post cholecystectomy. 8. Aortic atherosclerosis. Aortic Atherosclerosis (ICD10-I70.0) and Emphysema (ICD10-J43.9). Electronically Signed   By: Zetta Bills M.D.   On: 09/06/2022 16:43       Hosie Poisson M.D. [facility] 09/08/2022, 2:16 PM  Available via Epic secure chat 7am-7pm After 7 pm, please refer to night coverage provider listed on amion.

## 2022-09-08 NOTE — Progress Notes (Signed)
SATURATION QUALIFICATIONS: (This note is used to comply with regulatory documentation for home oxygen)  Patient Saturations on Room Air at Rest = 87%  Patient Saturations on Room Air while Ambulating = 84%  Patient Saturations on 2 Liters of oxygen while Ambulating = 92%  Please briefly explain why patient needs home oxygen: desaturation at rest and with ambulation

## 2022-09-08 NOTE — Progress Notes (Signed)
Pt refused cpap. Machine remained bedside.

## 2022-09-09 ENCOUNTER — Other Ambulatory Visit: Payer: Self-pay | Admitting: Physician Assistant

## 2022-09-09 ENCOUNTER — Inpatient Hospital Stay: Payer: Medicare Other

## 2022-09-09 DIAGNOSIS — J441 Chronic obstructive pulmonary disease with (acute) exacerbation: Secondary | ICD-10-CM | POA: Diagnosis not present

## 2022-09-09 DIAGNOSIS — J189 Pneumonia, unspecified organism: Secondary | ICD-10-CM | POA: Diagnosis not present

## 2022-09-09 DIAGNOSIS — I10 Essential (primary) hypertension: Secondary | ICD-10-CM | POA: Diagnosis not present

## 2022-09-09 DIAGNOSIS — C3490 Malignant neoplasm of unspecified part of unspecified bronchus or lung: Secondary | ICD-10-CM | POA: Diagnosis not present

## 2022-09-09 DIAGNOSIS — C7932 Secondary malignant neoplasm of cerebral meninges: Secondary | ICD-10-CM | POA: Diagnosis not present

## 2022-09-09 LAB — GLUCOSE, CAPILLARY: Glucose-Capillary: 84 mg/dL (ref 70–99)

## 2022-09-09 MED ORDER — GUAIFENESIN ER 600 MG PO TB12: 600.0000 mg | ORAL_TABLET | Freq: Two times a day (BID) | ORAL | Status: DC

## 2022-09-09 MED ORDER — PREDNISONE 10 MG PO TABS: ORAL_TABLET | ORAL | 0 refills | Status: DC

## 2022-09-09 MED ORDER — IPRATROPIUM-ALBUTEROL 0.5-2.5 (3) MG/3ML IN SOLN: 3.0000 mL | Freq: Four times a day (QID) | RESPIRATORY_TRACT | 5 refills | Status: DC | PRN

## 2022-09-09 MED FILL — Fosaprepitant Dimeglumine For IV Infusion 150 MG (Base Eq): INTRAVENOUS | Qty: 5 | Status: AC

## 2022-09-09 MED FILL — Dexamethasone Sodium Phosphate Inj 100 MG/10ML: INTRAMUSCULAR | Qty: 1 | Status: AC

## 2022-09-09 NOTE — Progress Notes (Signed)
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  She also had a brain lesion.    Detected Alteration(s) / Biomarker(s)          Associated FDA-approved therapies  Clinical Trial Availability          % cfDNA or Amplification   KRAS G12C approved by FDA Adagrasib, Sotorasib Yes    1.7%   TP53 R249S None Yes          1.5%   PRIOR THERAPY: 1) SRS the the metastatic brain lesion under the care of Dr. Isidore Moos 2) Craniotomy on 07/10/22 under the care of Dr. Reatha Armour 3) radiation to the chest under care of Dr. Sondra Come.  Last dose on 08/23/2022   CURRENT THERAPY: systemic chemotherapy with carboplatin for an AUC of 5, to 500 mg per metered squared, and Keytruda 200 mg IV every 3 weeks.  She has not started the first cycle of her treatment yet.  Subjective: The patient is seen and examined today.  She continues to have the baseline shortness of breath and she is currently on home oxygen.  She has no fever or chills.  She would like to go home but I think she is too weak to go at this point.  She has no current nausea, vomiting, diarrhea or constipation.  She has no headache or visual changes.  She was admitted from the hospital before starting the first dose of her treatment because of fever and shortness of breath and imaging studies showed increased opacity in the left side of the chest consistent with radiation-induced pneumonitis/pneumonia.  There was also improvement of her disease with shrinkage of the left hilar mass.  Objective: Vital signs in last 24 hours: Temp:  [98.4 F (36.9 C)-98.7 F (37.1 C)] 98.4 F (36.9 C) (12/04 0450) Pulse Rate:  [63-90] 63 (12/04 0450) Resp:  [19-22] 20 (12/04 0450) BP: (143-150)/(53-59) 145/59 (12/04 0450) SpO2:  [92 %-97 %] 97 % (12/04  0450)  Intake/Output from previous day: 12/03 0701 - 12/04 0700 In: 290 [P.O.:290] Out: 1000 [Urine:1000] Intake/Output this shift: No intake/output data recorded.  General appearance: alert, cooperative, fatigued, and no distress Resp: rales LUL Cardio: regular rate and rhythm, S1, S2 normal, no murmur, click, rub or gallop GI: soft, non-tender; bowel sounds normal; no masses,  no organomegaly Extremities: extremities normal, atraumatic, no cyanosis or edema  Lab Results:  Recent Labs    09/06/22 1101 09/08/22 0357  WBC 4.2 3.6*  HGB 10.6* 11.1*  HCT 33.3* 34.6*  PLT 262 270   BMET Recent Labs    09/06/22 1101 09/08/22 0357  NA 139 141  K 3.4* 4.2  CL 108 106  CO2 23 29  GLUCOSE 129* 118*  BUN 16 14  CREATININE 0.53 0.51  CALCIUM 8.0* 8.5*    Studies/Results: No results found.  Medications: I have reviewed the patient's current medications.   Assessment/Plan: This is a very pleasant 81 years old white female with a stage IV non-small cell lung cancer with positive KRAS G12C mutation status post SRS followed by craniotomy and resection of solitary brain metastasis.  She also underwent palliative radiotherapy to the chest under the care of Dr. Sondra Come completed August 23, 2022. The patient was supposed to start the first cycle of her systemic chemotherapy with carboplatin, Alimta and Keytruda last week  but she had fever and shortness of breath and she was admitted to the hospital before starting the first dose of her treatment. Her imaging studies showed suspicious radiation-induced pneumonitis/pneumonia and she is currently on treatment with antibiotics and steroid. I recommended for the patient to delay the start of her chemotherapy by at least 1 week after her discharge to give her more time for recovery. For the radiation-induced pneumonitis, she will need to stay on a tapered dose of steroids over the next few weeks. I will arrange for the patient a follow-up  appointment with me at the [facility] in around 10 days from now for reevaluation and resuming her treatment if she feels better. Thank you so much for taking good care of Ms. Laurie Mccoy.  Please call if you have any questions.   LOS: 7 days    Eilleen Kempf 09/09/2022

## 2022-09-09 NOTE — Care Management Important Message (Signed)
Important Message  Patient Details IM Letter given to the Patient Name: Laurie Mccoy MRN: [id number] Date of Birth: Mar 16, 1941   Medicare Important Message Given:  Yes     Kerin Salen 09/09/2022, 10:41 AM

## 2022-09-09 NOTE — Progress Notes (Signed)
Patient is not using the [facility] and [facility], she brought her Breztri instead

## 2022-09-09 NOTE — TOC Transition Note (Signed)
Transition of Care [facility]) - CM/SW Discharge Note   Patient Details  Name: Erlean Mealor MRN: [id number] Date of Birth: 1941-08-30  [facility] CM/SW Contact:  Leeroy Cha, RN Phone Number: 09/09/2022, 10:40 AM   Clinical Narrative:    O2 delivered into the room at 1030 for home use.  Pt will be [facility] through [facility]   Final next level of care: [place] Barriers to Discharge: Barriers Resolved   Patient Goals and CMS Choice Patient states their goals for this hospitalization and ongoing recovery are:: to go home CMS Medicare.gov Compare Post Acute Care list provided to:: Patient Choice offered to / list presented to : Patient  Discharge Placement                       Discharge Plan and Services   Discharge Planning Services: CM Consult Post Acute Care Choice: Durable Medical Equipment, Home Health              Date DME Agency Contacted: 09/09/22 Time DME Agency Contacted: [phone] Representative spoke with at DME Agency: [place]: PT [place]: [place] ([place]) Date [place]: 09/09/22 Time [place]: [phone] Representative spoke with at [place]: [place] ([place]) Interventions     Readmission Risk Interventions   No data to display

## 2022-09-10 ENCOUNTER — Other Ambulatory Visit: Payer: Medicare Other

## 2022-09-10 ENCOUNTER — Ambulatory Visit: Payer: Medicare Other | Admitting: Physician Assistant

## 2022-09-10 ENCOUNTER — Ambulatory Visit: Payer: Medicare Other | Admitting: Internal Medicine

## 2022-09-10 ENCOUNTER — Ambulatory Visit: Payer: Medicare Other

## 2022-09-10 LAB — GLUCOSE, CAPILLARY: Glucose-Capillary: 187 mg/dL — ABNORMAL HIGH (ref 70–99)

## 2022-09-11 ENCOUNTER — Other Ambulatory Visit: Payer: Self-pay

## 2022-09-11 NOTE — Discharge Summary (Signed)
Physician Discharge Summary   Patient: Laurie Mccoy MRN: [id number] DOB: Sep 26, 1941  Admit date:     09/02/2022  Discharge date: 09/09/2022  Discharge Physician: Hosie Poisson   PCP: [name], NP   Recommendations at discharge:  Follow up with PCP in one week.  Please follow up with Dr Julien Nordmann as recommended.    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 Patient presented secondary to fever and shortness of breath just before receiving infusion at cancer center. Initial imaging concerning for likely pneumonia. Associated hypoxia requiring supplemental oxygen. Empiric Vancomycin and Cefepime started on admission.  Assessment and Plan:  Acute respiratory failure with hypoxia probably secondary to left lower lobe pneumonia in the setting of non-small cell lung cancer with brain mets and COPD She is currently required up to 2 L of nasal cannula oxygen to keep sats greater than 90%  she was started on broad-spectrum IV antibiotics and blood cultures were obtained. Completed 5 days of IV antibiotics.   Her COVID-19 and influenza PCR is negative RVP is negative so far. Follow blood cultures and sputum cultures. So far they  have been negative.  Continue with incentive spirometry and flutter valve along with a short course of steroids for acute bronchitis.  Continue with home inhalers, DuoNebs as needed. CT chest ordered as pt reports feeling miserable, and she reports some dyspnea and unabel to wean her off the oxygen.  CT chest  shows   Patchy ground-glass in the LEFT upper lobe and to a lesser extent in the RIGHT upper lobe as developed since previous imaging. Findings are nonspecific but could be related to recent radiotherapy or post treatment related pneumonitis. Ambulating oxygen levels checked and she requires about 2 lit of [place] oxygen.  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   She was scheduled to start chemotherapy on 09/02/2022. She is also scheduled for port placement next week.    Hypertension BP parameters are well controlled.  Continue home medications.       Type 2 diabetes mellitus CBG (last 3)  Resume home meds    Nonobstructive coronary artery disease Pt denies any chest pain or sob.        Anxiety Continue with Xanax     Sepsis present on admission secondary to left lower lobe pneumonia. Urine and blood cultures have been negative.      Anemia of chronic disease Hemoglobin around 11.    Hypokalemia: replaced.         Consultants: Dr Julien Nordmann with oncology Procedures performed: none.   Disposition: Home Diet recommendation:  Discharge Diet Orders (From admission, onward)     Start     Ordered   09/09/22 0000  Diet - low sodium heart healthy  09/09/22 0848           Carb modified diet DISCHARGE MEDICATION: Allergies as of 09/09/2022       Reactions   Amoxicillin    itching   Compazine [prochlorperazine]    Lose muscle control in face    Norvasc [amlodipine] Swelling   Lower extremity swelling    Trelegy Ellipta [fluticasone-umeclidin-vilant] Swelling   Swelling in legs and headache.        Medication List     STOP taking these medications     Claritin 10 MG tablet Generic drug: loratadine   fluconazole 100 MG tablet Commonly known as: DIFLUCAN   hydrochlorothiazide 25 MG tablet Commonly known as: HYDRODIURIL   levETIRAcetam 500 MG tablet Commonly known as: Keppra   lidocaine-prilocaine cream Commonly known as: EMLA   nitrofurantoin (macrocrystal-monohydrate) 100 MG capsule Commonly known as: Macrobid   UDDERLY SMOOTH EXTRA CARE 20 EX       TAKE these medications    acetaminophen 500 MG tablet Commonly known as: TYLENOL Take 500 mg by mouth every 6 (six) hours as needed for mild pain or headache.   albuterol (2.5 MG/3ML) 0.083% nebulizer solution Commonly known as: PROVENTIL Take 3 mLs (2.5 mg total) by nebulization every 4 (four) hours as needed for wheezing or shortness of breath.   albuterol 108 (90 Base) MCG/ACT inhaler Commonly known as: VENTOLIN HFA Inhale 2 puffs into the lungs every 6 (six) hours as needed for wheezing or shortness of breath.   ALPRAZolam 0.25 MG tablet Commonly known as: XANAX Take 0.25 mg by mouth at bedtime as needed for anxiety.   Breztri Aerosphere 160-9-4.8 MCG/ACT Aero Generic drug: Budeson-Glycopyrrol-Formoterol INHALE 2 PUFFS BY MOUTH EVERY MORNING AND EVERY NIGHT AT BEDTIME What changed:  how much to take how to take this when to take this additional instructions   folic acid 1 MG tablet Commonly known as: FOLVITE Take 1 tablet (1 mg total) by mouth daily.   guaiFENesin 600 MG 12 hr tablet Commonly known as: MUCINEX Take 1 tablet (600 mg total) by mouth 2 (two) times daily.   ipratropium-albuterol 0.5-2.5 (3) MG/3ML Soln Commonly known as: DUONEB Take 3 mLs by nebulization every 6 (six) hours as needed.   levocetirizine 5 MG tablet Commonly known as: XYZAL Take 5 mg by mouth at bedtime as needed (for seasonal allergies- if not taking generic Claritin).   LORazepam 1 MG tablet Commonly known as: ATIVAN Take 1 tablet (1 mg total) by mouth as needed. 30  min Prior to MRI or brain radiation procedure What changed:  reasons to take this additional instructions   metFORMIN 500 MG 24 hr tablet Commonly known as: GLUCOPHAGE-XR Take 500 mg by mouth every morning.   ondansetron 8 MG tablet Commonly known as: ZOFRAN Take 1 tablet (8 mg total) by mouth every 8 (eight) hours as needed for nausea or vomiting.   OVER THE COUNTER MEDICATION Apply 1 application  topically See admin instructions. Sonafine Wound Dressing Topical Emulsion- Apply to the back 1-2 times a day   pantoprazole 20 MG tablet Commonly known as: PROTONIX Take 20 mg by mouth every evening.   predniSONE 10 MG tablet Commonly known as: DELTASONE Prednisone 60 mg daily for 5 more days followed  Prednisone 50 mg daily for 7 days followed by  Prednisone 40 mg daily for 7 days followed by  Prednisone 30 mg daily for 7 days followed by  Prednisone 20 mg daily for 7 days followed by  Prednisone  10 mg daily for 7 days followed by  Prednisone 5 mg daily for 7 days and stop.   sucralfate 1 GM/10ML suspension Commonly known as: Carafate Take 10 mLs (1 g total) by mouth 4 (four) times daily -  with meals and at bedtime.   telmisartan 20 MG tablet Commonly known as: MICARDIS Take 1 tablet (20 mg total) by mouth at bedtime. What changed: additional instructions   traMADol 50 MG tablet Commonly known as: ULTRAM Take 2 tablets (100 mg total) by mouth every 6 (six) hours as needed for severe pain. What changed: how much to take        [facility] Follow up.   Why: they will call you for the forst home [facility] Follow up.   Why: if any problems with home o2 Contact information: [address] [place] [address] [phone]                Discharge Exam: Filed Weights   09/06/22 0504  Weight: 92.6 kg   General exam: Appears calm and comfortable  Respiratory system: Clear to auscultation.  Respiratory effort normal. Cardiovascular system: S1 & S2 heard, RRR. No JVD, murmurs, rubs, gallops or clicks. No pedal edema. Gastrointestinal system: Abdomen is nondistended, soft and nontender. No organomegaly or masses felt. Normal bowel sounds heard. Central nervous system: Alert and oriented. No focal neurological deficits. Extremities: Symmetric 5 x 5 power. Skin: No rashes, lesions or ulcers Psychiatry: Judgement and insight appear normal. Mood & affect appropriate.    Condition at discharge: fair  The results of significant diagnostics from this hospitalization (including imaging, microbiology, ancillary and laboratory) are listed below for reference.   Imaging Studies: CT CHEST W CONTRAST  Result Date: 09/06/2022 CLINICAL DATA:  History of suspected pneumonia. Also with history of non-small cell lung cancer. * Tracking Code: BO * EXAM: CT CHEST WITH CONTRAST TECHNIQUE: Multidetector CT imaging of the chest was performed during intravenous contrast administration. RADIATION DOSE REDUCTION: This exam was performed according to the departmental dose-optimization program which includes automated exposure control, adjustment of the mA and/or kV according to patient size and/or use of iterative reconstruction technique. CONTRAST:  . 75mL OMNIPAQUE IOHEXOL 300 MG/ML  SOLN COMPARISON:  July 04, 2022 FINDINGS: Cardiovascular: Calcified aortic atherosclerotic changes. Noncalcified plaque as well. No signs of aneurysmal dilation of the thoracic aorta. Normal heart size without pericardial effusion or nodularity Central pulmonary vasculature is unremarkable to the extent evaluated on this venous phase assessment. Mediastinum/Nodes: No signs of adenopathy in the chest. Esophagus grossly normal. Lungs/Pleura: Patchy ground-glass in the LEFT upper lobe and to a lesser extent in the RIGHT upper lobe as developed since previous imaging. Mass along the LEFT hilum abutting the major fissure is  diminished in size now 2.0 x 1.3 cm previously 3.5 x 3.0 cm. Fiducial markers in the RIGHT upper lobe in the central portion of the subsolid nodule, area of also diminished in size measuring 12 mm as compared to 15 mm on the prior study. No signs of new nodule. Pulmonary emphysema is similar to prior imaging. There has been interval development also of a small LEFT-sided pleural effusion. Mild lingular scarring or consolidation is similar to prior imaging. Upper Abdomen: Post cholecystectomy. Signs of hepatic steatosis. Visualized liver otherwise unremarkable. Pancreas, spleen, adrenal glands and kidneys unremarkable aside from pancreatic atrophy to the extent evaluated. No acute gastrointestinal findings in  the upper abdomen. Musculoskeletal: No acute bone finding. No destructive bone process. Spinal degenerative changes. IMPRESSION: 1. Patchy ground-glass in the LEFT upper lobe and to a lesser extent in the RIGHT upper lobe as developed since previous imaging. Findings are nonspecific but could be related to recent radiotherapy or post treatment related pneumonitis. Correlate with respiratory symptoms and with any evidence of viral infection. No signs of classic lobar type pneumonia. 2. Interval development of a small LEFT-sided pleural effusion potentially related to above findings. 3. Diminished size of LEFT juxta hilar and Peri fissural mass in the LEFT chest compatible with response to therapy. 4. Diminishing size albeit slight decrease of a subsolid nodule in the RIGHT upper lobe. 5. Pulmonary emphysema is similar to prior imaging. 6. Signs of hepatic steatosis. 7. Post cholecystectomy. 8. Aortic atherosclerosis. Aortic Atherosclerosis (ICD10-I70.0) and Emphysema (ICD10-J43.9). Electronically Signed   By: Zetta Bills M.D.   On: 09/06/2022 16:43   DG Chest 2 View  Result Date: 09/06/2022 CLINICAL DATA:  History of lung cancer. EXAM: CHEST - 2 VIEW COMPARISON:  September 02, 2022.  July 04, 2022.  FINDINGS: Stable cardiomediastinal silhouette. Right lung is clear. Stable left lung density is noted consistent with malignancy. Bony thorax is unremarkable. IMPRESSION: Stable left lung density is noted consistent with malignancy. Electronically Signed   By: Marijo Conception M.D.   On: 09/06/2022 10:07   DG Chest Portable 1 View  Result Date: 09/02/2022 CLINICAL DATA:  Patient with non-small cell lung cancer with chills, shortness of breath, coughing, and fever EXAM: PORTABLE CHEST 1 VIEW COMPARISON:  CT chest dated 07/04/2022, chest radiograph dated 02/13/2022 FINDINGS: Normal lung volumes. Hazy left mid lung opacity in keeping with known malignancy. Dense left retrocardiac opacity. Metallic fiducial projects over the right mid lung. No pleural effusion or pneumothorax. Enlarged cardiomediastinal silhouette is likely projectional. The visualized skeletal structures are unremarkable. IMPRESSION: 1. Hazy left mid lung opacity in keeping with known malignancy. 2. Dense left retrocardiac opacity, which may represent atelectasis or pneumonia. Electronically Signed   By: Darrin Nipper M.D.   On: 09/02/2022 17:02    Microbiology: Results for orders placed or performed during the hospital encounter of 09/02/22  Resp Panel by RT-PCR (Flu A&B, Covid) Anterior Nasal Swab     Status: None   Collection Time: 09/02/22  3:52 PM   Specimen: Anterior Nasal Swab  Result Value Ref Range Status   SARS Coronavirus 2 by RT PCR NEGATIVE NEGATIVE Final    Comment: (NOTE) SARS-CoV-2 target nucleic acids are NOT DETECTED.  The SARS-CoV-2 RNA is generally detectable in upper respiratory specimens during the acute phase of infection. The lowest concentration of SARS-CoV-2 viral copies this assay can detect is 138 copies/mL. A negative result does not preclude SARS-Cov-2 infection and should not be used as the sole basis for treatment or other patient management decisions. A negative result may occur with  improper specimen  collection/handling, submission of specimen other than nasopharyngeal swab, presence of viral mutation(s) within the areas targeted by this assay, and inadequate number of viral copies(<138 copies/mL). A negative result must be combined with clinical observations, patient history, and epidemiological information. The expected result is Negative.  Fact Sheet for Patients:  EntrepreneurPulse.com.au  Fact Sheet for Healthcare Providers:  IncredibleEmployment.be  This test is no t yet approved or cleared by the [place] FDA and  has been authorized for detection and/or diagnosis of SARS-CoV-2 by FDA under an Emergency Use Authorization (EUA). This EUA will remain  in effect (meaning this test can be used) for the duration of the COVID-19 declaration under Section 564(b)(1) of the Act, 21 U.S.C.section 360bbb-3(b)(1), unless the authorization is terminated  or revoked sooner.       Influenza A by PCR NEGATIVE NEGATIVE Final   Influenza B by PCR NEGATIVE NEGATIVE Final    Comment: (NOTE) The Xpert Xpress SARS-CoV-2/FLU/RSV plus assay is intended as an aid in the diagnosis of influenza from Nasopharyngeal swab specimens and should not be used as a sole basis for treatment. Nasal washings and aspirates are unacceptable for Xpert Xpress SARS-CoV-2/FLU/RSV testing.  Fact Sheet for Patients: EntrepreneurPulse.com.au  Fact Sheet for Healthcare Providers: IncredibleEmployment.be  This test is not yet approved or cleared by the [place] FDA and has been authorized for detection and/or diagnosis of SARS-CoV-2 by FDA under an Emergency Use Authorization (EUA). This EUA will remain in effect (meaning this test can be used) for the duration of the COVID-19 declaration under Section 564(b)(1) of the Act, 21 U.S.C. section 360bbb-3(b)(1), unless the authorization is terminated or revoked.  Performed at [facility], [place] [address], [place], [place] [address]   Blood culture (routine x 2)     Status: None   Collection Time: 09/02/22  4:12 PM   Specimen: BLOOD  Result Value Ref Range Status   Specimen Description   Final    BLOOD SITE NOT SPECIFIED Performed at [facility] [address], [place], [place] [address]    Special Requests   Final    BOTTLES DRAWN AEROBIC AND ANAEROBIC Blood Culture adequate volume Performed at [facility] [address], [place], [place] [address]    Culture   Final    NO GROWTH 5 DAYS Performed at [facility], [place] [address], [place], [place] [address]    Report Status 09/07/2022 FINAL  Final  Urine Culture     Status: None   Collection Time: 09/02/22  8:57 PM   Specimen: Urine, Clean Catch  Result Value Ref Range Status   Specimen Description   Final    URINE, CLEAN CATCH Performed at [facility], [place] [address] [place], [place] [address]    Special Requests   Final    NONE Performed at [facility], [place] [address] [place], [place] [address]    Culture   Final    NO GROWTH Performed at [facility], [place] [address] [place], [place] [address]    Report Status 09/03/2022 FINAL  Final  Respiratory (~20 pathogens) panel by PCR     Status: None   Collection Time: 09/03/22  5:28 PM   Specimen: Nasopharyngeal Swab; Respiratory  Result Value Ref Range Status   Adenovirus NOT DETECTED NOT DETECTED Final   Coronavirus 229E NOT DETECTED NOT DETECTED Final    Comment: (NOTE) The Coronavirus on the Respiratory Panel, DOES NOT test for the novel  Coronavirus (2019 nCoV)    Coronavirus HKU1 NOT DETECTED NOT DETECTED Final   Coronavirus NL63 NOT DETECTED NOT DETECTED Final   Coronavirus OC43 NOT DETECTED NOT DETECTED Final   Metapneumovirus NOT DETECTED NOT DETECTED Final   Rhinovirus / Enterovirus NOT DETECTED NOT DETECTED Final   Influenza A  NOT DETECTED NOT DETECTED Final   Influenza B NOT DETECTED NOT DETECTED Final   Parainfluenza Virus 1 NOT DETECTED NOT DETECTED Final   Parainfluenza Virus 2 NOT DETECTED NOT DETECTED Final   Parainfluenza Virus 3  NOT DETECTED NOT DETECTED Final   Parainfluenza Virus 4 NOT DETECTED NOT DETECTED Final   Respiratory Syncytial Virus NOT DETECTED NOT DETECTED Final   Bordetella pertussis NOT DETECTED NOT DETECTED Final   Bordetella Parapertussis NOT DETECTED NOT DETECTED Final   Chlamydophila pneumoniae NOT DETECTED NOT DETECTED Final   Mycoplasma pneumoniae NOT DETECTED NOT DETECTED Final    Comment: Performed at [facility], [place] [address] [place], [place] [address]  MRSA Next Gen by PCR, Nasal     Status: None   Collection Time: 09/04/22  2:01 PM   Specimen: Nasal Mucosa; Nasal Swab  Result Value Ref Range Status   MRSA by PCR Next Gen NOT DETECTED NOT DETECTED Final    Comment: (NOTE) The GeneXpert MRSA Assay (FDA approved for NASAL specimens only), is one component of a comprehensive MRSA colonization surveillance program. It is not intended to diagnose MRSA infection nor to guide or monitor treatment for MRSA infections. Test performance is not FDA approved in patients less than 85 years old. Performed at [facility], [place] [address], [place], [place] [address]     Labs: CBC: Recent Labs  Lab 09/06/22 1101 09/08/22 0357  WBC 4.2 3.6*  NEUTROABS 3.3 2.7  HGB 10.6* 11.1*  HCT 33.3* 34.6*  MCV 97.9 97.5  PLT 262 756   Basic Metabolic Panel: Recent Labs  Lab 09/05/22 0407 09/06/22 1101 09/08/22 0357  NA  --  139 141  K  --  3.4* 4.2  CL  --  108 106  CO2  --  23 29  GLUCOSE  --  129* 118*  BUN  --  16 14  CREATININE 0.48 0.53 0.51  CALCIUM  --  8.0* 8.5*   Liver Function Tests: Recent Labs  Lab 09/06/22 1101  AST 17  ALT 23  ALKPHOS 48  BILITOT 0.6  PROT 5.3*  ALBUMIN 2.5*   CBG: Recent Labs  Lab 09/08/22 1207  09/08/22 1707 09/08/22 2051 09/09/22 0734 09/09/22 1152  GLUCAP 139* 247* 176* 84 187*    Discharge time spent: 45 minutes.   Signed: Hosie Poisson, MD [facility]

## 2022-09-15 NOTE — Progress Notes (Signed)
[facility] OFFICE PROGRESS NOTE  [name], NP [phone] [address] [place] [address]  DIAGNOSIS: Stage IV (T2 a, N3, M1b) non-small cell lung cancer, adenocarcinoma presented with left upper lobe perihilar mass in addition to left hilar and mediastinal lymphadenopathy as well as left supraclavicular lymphadenopathy diagnosed in September 2023.  She also had a brain lesion.    Detected Alteration(s) / Biomarker(s)          Associated FDA-approved therapies  Clinical Trial Availability          % cfDNA or Amplification   KRAS G12C approved by FDA Adagrasib, Sotorasib Yes    1.7%   TP53 [id number] None Yes          1.5%  PRIOR THERAPY: 1) SRS the the metastatic brain lesion under the care of Dr. Basilio Cairo 2) Craniotomy on 07/10/22 under the care of Dr. Jake Samples 3) radiation to the chest under care of Dr. Roselind Messier.  Last dose on 08/23/2022  CURRENT THERAPY: Systemic chemotherapy with carboplatin for an AUC of 5, to 500 mg per metered squared, and Keytruda 200 mg IV every 3 weeks.  First dose expected on 09/16/2022.      INTERVAL HISTORY: Laurie Mccoy 81 y.o. female returns to the clinic today a for follow-up visit accompanied by her husband.  The patient was last seen by myself 09/02/2022.  The patient was supposed to undergo her first cycle of treatment that day but was found to have fever, chills, worsening shortness of breath, and weakness.  The patient was subsequently sent to the emergency room and she was admitted from 11/27-12/01/2022 for possible pneumonia and radiation pneumonitis.  The patient was treated with broad-spectrum antibiotics, DuoNebs, inhalers, and steroids.  She is currently undergoing steroid treatment with 50 milligrams per day.  She had a CT scan while in the hospital which did show improvement in the metastatic lesion.  She was discharged with 2 L supplemental oxygen.    Since being discharged, she is feeling fatigued and weak today.  The patient's  son got her foot pedals for Christmas and he is going to bring it over sooner so the patient may perform some leg pedaling exercises.  Her temperature is 100.4 today. Her knowledge, she has not been having fevers at home because she had not been checking.  He also has been using a pure wick at home and now she is having some vaginal irritation. Historically, when she had vaginal yeast infections, she would use Vagisil.  She denies malodorous urine or cloudy urine. She reports she gets cold frequently at baseline.  Denies any night sweats.  She reports continued dyspnea on exertion.  When she uses her breast tree it does help.  She needs to reschedule her outpatient pulmonology appointment.  She is no longer wearing supplemental oxygen and her oxygen is 96% on room air today.  She states she seldomly coughs when she does it usually is dry but is sometimes productive.  Denies any chest pain or hemoptysis.  Denies any nausea, vomiting, diarrhea, or constipation.  Denies any headache or visual changes.  She is here today for reevaluation before considering starting cycle #1.    MEDICAL HISTORY: Past Medical History:  Diagnosis Date   Anxiety    "had panic attacks years ago"   Asthma    COPD (chronic obstructive pulmonary disease) (HCC)    Goiter 2022   History of hiatal hernia    "dx in college, never  bothered me"   History of kidney stones 2012   Hypertension    Lung cancer (HCC)    Sleep apnea     ALLERGIES:  is allergic to amoxicillin, compazine [prochlorperazine], norvasc [amlodipine], and trelegy ellipta [fluticasone-umeclidin-vilant].  MEDICATIONS:  Current Outpatient Medications  Medication Sig Dispense Refill   acetaminophen (TYLENOL) 500 MG tablet Take 500 mg by mouth every 6 (six) hours as needed for mild pain or headache.     albuterol (PROVENTIL) (2.5 MG/3ML) 0.083% nebulizer solution Take 3 mLs (2.5 mg total) by nebulization every 4 (four) hours as needed for wheezing or shortness  of breath.     albuterol (VENTOLIN HFA) 108 (90 Base) MCG/ACT inhaler Inhale 2 puffs into the lungs every 6 (six) hours as needed for wheezing or shortness of breath. 18 g 1   ALPRAZolam (XANAX) 0.25 MG tablet Take 0.25 mg by mouth at bedtime as needed for anxiety.     Budeson-Glycopyrrol-Formoterol (BREZTRI AEROSPHERE) 160-9-4.8 MCG/ACT AERO INHALE 2 PUFFS BY MOUTH EVERY MORNING AND EVERY NIGHT AT BEDTIME (Patient taking differently: Inhale 2 puffs into the lungs in the morning and at bedtime.) 10.7 g 5   folic acid (FOLVITE) 1 MG tablet Take 1 tablet (1 mg total) by mouth daily. 30 tablet 2   guaiFENesin (MUCINEX) 600 MG 12 hr tablet Take 1 tablet (600 mg total) by mouth 2 (two) times daily.     ipratropium-albuterol (DUONEB) 0.5-2.5 (3) MG/3ML SOLN Take 3 mLs by nebulization every 6 (six) hours as needed. 360 mL 5   levocetirizine (XYZAL) 5 MG tablet Take 5 mg by mouth at bedtime as needed (for seasonal allergies- if not taking generic Claritin).     LORazepam (ATIVAN) 1 MG tablet Take 1 tablet (1 mg total) by mouth as needed. 30 min Prior to MRI or brain radiation procedure (Patient taking differently: Take 1 mg by mouth as needed (30 minutes prior to MRI or brain radiation procedure).) 5 tablet 0   metFORMIN (GLUCOPHAGE-XR) 500 MG 24 hr tablet Take 500 mg by mouth every morning.     ondansetron (ZOFRAN) 8 MG tablet Take 1 tablet (8 mg total) by mouth every 8 (eight) hours as needed for nausea or vomiting. 30 tablet 2   OVER THE COUNTER MEDICATION Apply 1 application  topically See admin instructions. Sonafine Wound Dressing Topical Emulsion- Apply to the back 1-2 times a day     pantoprazole (PROTONIX) 20 MG tablet Take 20 mg by mouth every evening.     predniSONE (DELTASONE) 10 MG tablet Prednisone 60 mg daily for 5 more days followed  Prednisone 50 mg daily for 7 days followed by  Prednisone 40 mg daily for 7 days followed by  Prednisone 30 mg daily for 7 days followed by  Prednisone 20 mg  daily for 7 days followed by  Prednisone 10 mg daily for 7 days followed by  Prednisone 5 mg daily for 7 days and stop. 140 tablet 0   sucralfate (CARAFATE) 1 GM/10ML suspension Take 10 mLs (1 g total) by mouth 4 (four) times daily -  with meals and at bedtime. 420 mL 1   telmisartan (MICARDIS) 20 MG tablet Take 1 tablet (20 mg total) by mouth at bedtime. (Patient taking differently: Take 20 mg by mouth at bedtime. Take 20 mg by mouth once a day if Systolic number is 135 or greater and Diastolic number is also elevated) 90 tablet 1   traMADol (ULTRAM) 50 MG tablet Take 2 tablets (100 mg  total) by mouth every 6 (six) hours as needed for severe pain. (Patient taking differently: Take 50 mg by mouth every 6 (six) hours as needed for severe pain.) 30 tablet 0   No current facility-administered medications for this visit.    SURGICAL HISTORY:  Past Surgical History:  Procedure Laterality Date   APPENDECTOMY     APPLICATION OF CRANIAL NAVIGATION N/A 07/11/2022   Procedure: APPLICATION OF CRANIAL NAVIGATION;  Surgeon: Dawley, Alan Mulder, DO;  Location: [facility] OR;  Service: Neurosurgery;  Laterality: N/A;   BRONCHIAL BIOPSY  06/12/2021   Procedure: BRONCHIAL BIOPSIES;  Surgeon: Josephine Igo, DO;  Location: [facility] ENDOSCOPY;  Service: Pulmonary;;   BRONCHIAL BRUSHINGS  06/12/2021   Procedure: BRONCHIAL BRUSHINGS;  Surgeon: Josephine Igo, DO;  Location: [facility];  Service: Pulmonary;;   BRONCHIAL WASHINGS  06/12/2021   Procedure: BRONCHIAL WASHINGS;  Surgeon: Josephine Igo, DO;  Location: [facility];  Service: Pulmonary;;   CHOLECYSTECTOMY     COLON RESECTION     CRANIOTOMY N/A 07/11/2022   Procedure: Craniotomy for resection of tumor;  Surgeon: Bethann Goo, DO;  Location: [facility] OR;  Service: Neurosurgery;  Laterality: N/A;  RM 19   FIDUCIAL MARKER PLACEMENT  06/12/2021   Procedure: FIDUCIAL MARKER PLACEMENT;  Surgeon: Josephine Igo, DO;  Location: [facility];  Service: Pulmonary;;   hernia      x 2   TONSILLECTOMY     VIDEO BRONCHOSCOPY WITH ENDOBRONCHIAL NAVIGATION Bilateral 06/12/2021   Procedure: VIDEO BRONCHOSCOPY WITH ENDOBRONCHIAL NAVIGATION;  Surgeon: Josephine Igo, DO;  Location: [facility];  Service: Pulmonary;  Laterality: Bilateral;  ION   VIDEO BRONCHOSCOPY WITH RADIAL ENDOBRONCHIAL ULTRASOUND  06/12/2021   Procedure: RADIAL ENDOBRONCHIAL ULTRASOUND;  Surgeon: Josephine Igo, DO;  Location: [facility];  Service: Pulmonary;;    REVIEW OF SYSTEMS:   Review of Systems  Constitutional: Positive for fatigue, fever, and generalized weakness.  Negative for appetite change, or chills.  HENT:  Negative for mouth sores, nosebleeds, sore throat and trouble swallowing.   Eyes: Negative for eye problems and icterus.  Respiratory: Positive for dyspnea. Negative for cough, hemoptysis, and wheezing.   Cardiovascular: Negative for chest pain and leg swelling.  Gastrointestinal: Negative for abdominal pain, constipation, diarrhea, nausea and vomiting.  Genitourinary: Negative for bladder incontinence, difficulty urinating, dysuria, frequency and hematuria.   Musculoskeletal: Negative for back pain, gait problem, neck pain and neck stiffness.  Skin: Positive for vaginal irritation.  Neurological: Negative for dizziness, extremity weakness, gait problem, headaches, light-headedness and seizures.  Hematological: Negative for adenopathy. Does not bruise/bleed easily.  Psychiatric/Behavioral: Negative for confusion, depression and sleep disturbance. The patient is not nervous/anxious.     PHYSICAL EXAMINATION:  There were no vitals taken for this visit.  ECOG PERFORMANCE STATUS: 1  Physical Exam  Constitutional: Oriented to person, place, and time and well-developed, well-nourished, and in no distress.  HENT:  Head: Normocephalic and atraumatic.  Mouth/Throat: Oropharynx is clear and moist. No oropharyngeal exudate.  Eyes: Conjunctivae are normal. Right eye exhibits no  discharge. Left eye exhibits no discharge. No scleral icterus.  Neck: Normal range of motion. Neck supple.  Cardiovascular: Normal rate, regular rhythm, normal heart sounds and intact distal pulses.   Pulmonary/Chest: Effort normal.  Positive for some mild wheezing bilaterally.  No respiratory distress. No rales.  Abdominal: Soft. Bowel sounds are normal. Exhibits no distension and no mass. There is no tenderness.  Musculoskeletal: Normal range of motion. Exhibits no edema.  Lymphadenopathy:  No cervical adenopathy.  Neurological: Alert and oriented to person, place, and time. Exhibits also wasting.  Gait normal. Coordination normal.  Skin: Skin is warm and dry. No rash noted. Not diaphoretic. No erythema. No pallor.  Psychiatric: Mood, memory and judgment normal.  GYN: Mild external irritation of her genitalia. Vitals reviewed.  LABORATORY DATA: Lab Results  Component Value Date   WBC 3.6 (L) 09/08/2022   HGB 11.1 (L) 09/08/2022   HCT 34.6 (L) 09/08/2022   MCV 97.5 09/08/2022   PLT 270 09/08/2022      Chemistry      Component Value Date/Time   NA 141 09/08/2022 0357   NA 139 09/17/2019 1550   K 4.2 09/08/2022 0357   CL 106 09/08/2022 0357   CO2 29 09/08/2022 0357   BUN 14 09/08/2022 0357   BUN 9 09/17/2019 1550   CREATININE 0.51 09/08/2022 0357   CREATININE 0.49 09/02/2022 1243      Component Value Date/Time   CALCIUM 8.5 (L) 09/08/2022 0357   ALKPHOS 48 09/06/2022 1101   AST 17 09/06/2022 1101   AST 11 (L) 09/02/2022 1243   ALT 23 09/06/2022 1101   ALT 19 09/02/2022 1243   BILITOT 0.6 09/06/2022 1101   BILITOT 0.5 09/02/2022 1243       RADIOGRAPHIC STUDIES:  CT CHEST W CONTRAST  Result Date: 09/06/2022 CLINICAL DATA:  History of suspected pneumonia. Also with history of non-small cell lung cancer. * Tracking Code: BO * EXAM: CT CHEST WITH CONTRAST TECHNIQUE: Multidetector CT imaging of the chest was performed during intravenous contrast administration.  RADIATION DOSE REDUCTION: This exam was performed according to the departmental dose-optimization program which includes automated exposure control, adjustment of the mA and/or kV according to patient size and/or use of iterative reconstruction technique. CONTRAST:  . 75mL OMNIPAQUE IOHEXOL 300 MG/ML  SOLN COMPARISON:  July 04, 2022 FINDINGS: Cardiovascular: Calcified aortic atherosclerotic changes. Noncalcified plaque as well. No signs of aneurysmal dilation of the thoracic aorta. Normal heart size without pericardial effusion or nodularity Central pulmonary vasculature is unremarkable to the extent evaluated on this venous phase assessment. Mediastinum/Nodes: No signs of adenopathy in the chest. Esophagus grossly normal. Lungs/Pleura: Patchy ground-glass in the LEFT upper lobe and to a lesser extent in the RIGHT upper lobe as developed since previous imaging. Mass along the LEFT hilum abutting the major fissure is diminished in size now 2.0 x 1.3 cm previously 3.5 x 3.0 cm. Fiducial markers in the RIGHT upper lobe in the central portion of the subsolid nodule, area of also diminished in size measuring 12 mm as compared to 15 mm on the prior study. No signs of new nodule. Pulmonary emphysema is similar to prior imaging. There has been interval development also of a small LEFT-sided pleural effusion. Mild lingular scarring or consolidation is similar to prior imaging. Upper Abdomen: Post cholecystectomy. Signs of hepatic steatosis. Visualized liver otherwise unremarkable. Pancreas, spleen, adrenal glands and kidneys unremarkable aside from pancreatic atrophy to the extent evaluated. No acute gastrointestinal findings in the upper abdomen. Musculoskeletal: No acute bone finding. No destructive bone process. Spinal degenerative changes. IMPRESSION: 1. Patchy ground-glass in the LEFT upper lobe and to a lesser extent in the RIGHT upper lobe as developed since previous imaging. Findings are nonspecific but could be  related to recent radiotherapy or post treatment related pneumonitis. Correlate with respiratory symptoms and with any evidence of viral infection. No signs of classic lobar type pneumonia. 2. Interval development of a small LEFT-sided pleural  effusion potentially related to above findings. 3. Diminished size of LEFT juxta hilar and Peri fissural mass in the LEFT chest compatible with response to therapy. 4. Diminishing size albeit slight decrease of a subsolid nodule in the RIGHT upper lobe. 5. Pulmonary emphysema is similar to prior imaging. 6. Signs of hepatic steatosis. 7. Post cholecystectomy. 8. Aortic atherosclerosis. Aortic Atherosclerosis (ICD10-I70.0) and Emphysema (ICD10-J43.9). Electronically Signed   By: Donzetta Kohut M.D.   On: 09/06/2022 16:43   DG Chest 2 View  Result Date: 09/06/2022 CLINICAL DATA:  History of lung cancer. EXAM: CHEST - 2 VIEW COMPARISON:  September 02, 2022.  July 04, 2022. FINDINGS: Stable cardiomediastinal silhouette. Right lung is clear. Stable left lung density is noted consistent with malignancy. Bony thorax is unremarkable. IMPRESSION: Stable left lung density is noted consistent with malignancy. Electronically Signed   By: Lupita Raider M.D.   On: 09/06/2022 10:07   DG Chest Portable 1 View  Result Date: 09/02/2022 CLINICAL DATA:  Patient with non-small cell lung cancer with chills, shortness of breath, coughing, and fever EXAM: PORTABLE CHEST 1 VIEW COMPARISON:  CT chest dated 07/04/2022, chest radiograph dated 02/13/2022 FINDINGS: Normal lung volumes. Hazy left mid lung opacity in keeping with known malignancy. Dense left retrocardiac opacity. Metallic fiducial projects over the right mid lung. No pleural effusion or pneumothorax. Enlarged cardiomediastinal silhouette is likely projectional. The visualized skeletal structures are unremarkable. IMPRESSION: 1. Hazy left mid lung opacity in keeping with known malignancy. 2. Dense left retrocardiac opacity,  which may represent atelectasis or pneumonia. Electronically Signed   By: Agustin Cree M.D.   On: 09/02/2022 17:02     ASSESSMENT/PLAN:  This is a very pleasant 81 year old Caucasian female with likely stage IV (T2a, N3, M1B) non-small cell lung cancer, adenocarcinoma presented with left upper lobe perihilar mass in addition to left hilar and mediastinal lymphadenopathy as well as left supraclavicular lymphadenopathy and a solitary brain metastasis diagnosed in September 2023. The molecular studies showed positive KRAS G12C mutation. Discussed this can be used in the second line setting in the future.    The patient underwent SRS and craniotomy under the care of Dr. Basilio Cairo and Dr. Jake Samples on 07/10/22. Dr. Jake Samples does not recommend any chemotherapy for at least 1 month from her surgery due to wound healing.    She is recently completed radiation to the chest under the care of Dr. Roselind Messier. The last day of radiation was scheduled for 08/23/22  The plan is to undergo systemic chemotherapy wit carboplatin for an AUC of 5, Alimta 500 mg/m, Keytruda 200 mg IV every 3 weeks.  Patient was recently in the hospital for pneumonia and radiation pneumonitis.  She is currently on a high-dose prednisone taper.  She is currently on 50 mg of prednisone.  The patient has been feeling fatigued and weak since being discharged.   The patient was seen Dr. Arbutus Ped today.  Labs were reviewed.  Dr. Arbutus Ped recommends waiting till the patient completes her prednisone taper and considering starting treatment after the new year.  She has an appointment on 10/08/2022.  Dr. Arbutus Ped recommends that instead of her prednisone being tapered down by 10 mg every week that she taper every 5 days.  We will arrange for UA and culture for her vaginal irritation.  On exam today she has some mild irritation which may be secondary to yeast infection.  I sent her nystatin to the pharmacy.  She also is going to pick up more  Vagisil.  Patient is  interested in a Port-A-Cath.  Her port appointment was canceled due to being in the hospital.  She was given the number to interventional radiology and instructed to call to reschedule this.  She will call her new pulmonology office to reschedule her new patient appointment.  The patient was instructed to use her foot pedal to improve her generalized fatigue and weakness in her lower extremities likely secondary to her prolonged hospitalization.  The patient was advised to call immediately if she has any concerning symptoms in the interval. The patient voices understanding of current disease status and treatment options and is in agreement with the current care plan. All questions were answered. The patient knows to call the clinic with any problems, questions or concerns. We can certainly see the patient much sooner if necessary         No orders of the defined types were placed in this encounter.     Anette Barra L Ashyra Cantin, PA-C 09/15/22  ADDENDUM: Hematology/Oncology Attending: I had a face-to-face encounter with the patient today.  I reviewed her records, lab, scan and recommended her care plan.  This is a very pleasant 81 years old white female with a stage IV non-small cell lung cancer, adenocarcinoma with positive KRAS G12C mutation.  Status post SRS followed by craniotomy and resection of solitary brain metastasis in addition to palliative radiotherapy to the disease in her chest under the care of Dr. Roselind Messier. The patient was supposed to start systemic chemotherapy with carboplatin, Alimta and Keytruda 2 weeks ago but she was admitted to the hospital with worsening dyspnea and suspicious pneumonia/radiation pneumonitis. He was treated with a course of antibiotic and she is currently on stable dose of prednisone currently on 50 mg p.o. daily. I recommended for the patient to delay the start of her systemic chemotherapy until early January 2023 as we continue to taper her  prednisone slowly over the next few weeks. Her last CT scan of the chest performed during her hospitalization showed improvement of her disease with decrease in the size of the left lung mass. The patient and her husband are in agreement with the current plan. She was advised to call immediately if she has any other concerning symptoms in the interval. The total time spent in the appointment was 30 minutes. Discussed with the patient and all questioned fully answered. She will call me if any problems arise. Lajuana Matte, MD

## 2022-09-16 ENCOUNTER — Telehealth: Payer: Self-pay

## 2022-09-16 ENCOUNTER — Encounter: Payer: Self-pay | Admitting: Internal Medicine

## 2022-09-16 ENCOUNTER — Other Ambulatory Visit: Payer: Medicare Other

## 2022-09-16 MED FILL — Fosaprepitant Dimeglumine For IV Infusion 150 MG (Base Eq): INTRAVENOUS | Qty: 5 | Status: AC

## 2022-09-16 MED FILL — Dexamethasone Sodium Phosphate Inj 100 MG/10ML: INTRAMUSCULAR | Qty: 1 | Status: AC

## 2022-09-16 NOTE — Telephone Encounter (Signed)
This nurse reached out to patient who had some questions about her schedule.  Patient thought that she was scheduled for an appointment today.  This nurse advised that she was scheduled for port/flush with labs however she has not had her port placed yet so that appointment has been canceled.  Advised that she is scheduled for regular lab appointment, office visit and then infusion on Tuesday 12/12 at 930 am. Patient also wanted to know if she still needs to have labs drawn since she was just discharged from the hospital on 12/4.  This nurse advised per provider that it is preferred since it has been a week and she needs to make sure that her labs are well enough to receive treatment.  Patient acknowledged understanding and is in agreement with having labs drawn.  No further questions or concerns at this time.

## 2022-09-17 ENCOUNTER — Inpatient Hospital Stay: Payer: Medicare Other

## 2022-09-17 ENCOUNTER — Inpatient Hospital Stay: Payer: Medicare Other | Attending: Internal Medicine | Admitting: Physician Assistant

## 2022-09-17 ENCOUNTER — Encounter: Payer: Self-pay | Admitting: Physician Assistant

## 2022-09-17 ENCOUNTER — Telehealth: Payer: Self-pay | Admitting: Physician Assistant

## 2022-09-17 VITALS — BP 137/54 | HR 97 | Temp 100.4°F | Resp 21 | Ht 66.0 in | Wt 200.9 lb

## 2022-09-17 DIAGNOSIS — Z7952 Long term (current) use of systemic steroids: Secondary | ICD-10-CM | POA: Insufficient documentation

## 2022-09-17 DIAGNOSIS — R300 Dysuria: Secondary | ICD-10-CM

## 2022-09-17 DIAGNOSIS — Z888 Allergy status to other drugs, medicaments and biological substances status: Secondary | ICD-10-CM | POA: Diagnosis not present

## 2022-09-17 DIAGNOSIS — Z87442 Personal history of urinary calculi: Secondary | ICD-10-CM | POA: Insufficient documentation

## 2022-09-17 DIAGNOSIS — C3412 Malignant neoplasm of upper lobe, left bronchus or lung: Secondary | ICD-10-CM | POA: Diagnosis present

## 2022-09-17 DIAGNOSIS — Z880 Allergy status to penicillin: Secondary | ICD-10-CM | POA: Diagnosis not present

## 2022-09-17 DIAGNOSIS — Z923 Personal history of irradiation: Secondary | ICD-10-CM | POA: Diagnosis not present

## 2022-09-17 DIAGNOSIS — C778 Secondary and unspecified malignant neoplasm of lymph nodes of multiple regions: Secondary | ICD-10-CM | POA: Insufficient documentation

## 2022-09-17 DIAGNOSIS — L304 Erythema intertrigo: Secondary | ICD-10-CM

## 2022-09-17 DIAGNOSIS — J90 Pleural effusion, not elsewhere classified: Secondary | ICD-10-CM | POA: Insufficient documentation

## 2022-09-17 DIAGNOSIS — J439 Emphysema, unspecified: Secondary | ICD-10-CM | POA: Diagnosis not present

## 2022-09-17 DIAGNOSIS — R5383 Other fatigue: Secondary | ICD-10-CM | POA: Insufficient documentation

## 2022-09-17 DIAGNOSIS — R509 Fever, unspecified: Secondary | ICD-10-CM | POA: Insufficient documentation

## 2022-09-17 DIAGNOSIS — R0600 Dyspnea, unspecified: Secondary | ICD-10-CM | POA: Insufficient documentation

## 2022-09-17 DIAGNOSIS — I700 Atherosclerosis of aorta: Secondary | ICD-10-CM | POA: Insufficient documentation

## 2022-09-17 DIAGNOSIS — C3490 Malignant neoplasm of unspecified part of unspecified bronchus or lung: Secondary | ICD-10-CM | POA: Diagnosis not present

## 2022-09-17 DIAGNOSIS — F419 Anxiety disorder, unspecified: Secondary | ICD-10-CM | POA: Diagnosis not present

## 2022-09-17 DIAGNOSIS — C7931 Secondary malignant neoplasm of brain: Secondary | ICD-10-CM | POA: Diagnosis not present

## 2022-09-17 DIAGNOSIS — R531 Weakness: Secondary | ICD-10-CM | POA: Diagnosis not present

## 2022-09-17 DIAGNOSIS — N898 Other specified noninflammatory disorders of vagina: Secondary | ICD-10-CM | POA: Diagnosis not present

## 2022-09-17 DIAGNOSIS — Z79899 Other long term (current) drug therapy: Secondary | ICD-10-CM | POA: Insufficient documentation

## 2022-09-17 DIAGNOSIS — R222 Localized swelling, mass and lump, trunk: Secondary | ICD-10-CM | POA: Insufficient documentation

## 2022-09-17 DIAGNOSIS — Z9049 Acquired absence of other specified parts of digestive tract: Secondary | ICD-10-CM | POA: Diagnosis not present

## 2022-09-17 LAB — CBC WITH DIFFERENTIAL (CANCER CENTER ONLY)
Abs Immature Granulocytes: 0.03 10*3/uL (ref 0.00–0.07)
Basophils Absolute: 0 10*3/uL (ref 0.0–0.1)
Basophils Relative: 1 %
Eosinophils Absolute: 0.1 10*3/uL (ref 0.0–0.5)
Eosinophils Relative: 2 %
HCT: 36.8 % (ref 36.0–46.0)
Hemoglobin: 12.1 g/dL (ref 12.0–15.0)
Immature Granulocytes: 1 %
Lymphocytes Relative: 16 %
Lymphs Abs: 0.8 10*3/uL (ref 0.7–4.0)
MCH: 31.7 pg (ref 26.0–34.0)
MCHC: 32.9 g/dL (ref 30.0–36.0)
MCV: 96.3 fL (ref 80.0–100.0)
Monocytes Absolute: 0.4 10*3/uL (ref 0.1–1.0)
Monocytes Relative: 8 %
Neutro Abs: 3.5 10*3/uL (ref 1.7–7.7)
Neutrophils Relative %: 72 %
Platelet Count: 193 10*3/uL (ref 150–400)
RBC: 3.82 MIL/uL — ABNORMAL LOW (ref 3.87–5.11)
RDW: 14.1 % (ref 11.5–15.5)
WBC Count: 4.9 10*3/uL (ref 4.0–10.5)
nRBC: 0 % (ref 0.0–0.2)

## 2022-09-17 LAB — CMP (CANCER CENTER ONLY)
ALT: 16 U/L (ref 0–44)
AST: 10 U/L — ABNORMAL LOW (ref 15–41)
Albumin: 3.5 g/dL (ref 3.5–5.0)
Alkaline Phosphatase: 55 U/L (ref 38–126)
Anion gap: 9 (ref 5–15)
BUN: 16 mg/dL (ref 8–23)
CO2: 27 mmol/L (ref 22–32)
Calcium: 8.9 mg/dL (ref 8.9–10.3)
Chloride: 104 mmol/L (ref 98–111)
Creatinine: 0.57 mg/dL (ref 0.44–1.00)
GFR, Estimated: >60 mL/min (ref >60)
Glucose, Bld: 178 mg/dL — ABNORMAL HIGH (ref 70–99)
Potassium: 3.8 mmol/L (ref 3.5–5.1)
Sodium: 140 mmol/L (ref 135–145)
Total Bilirubin: 0.6 mg/dL (ref 0.3–1.2)
Total Protein: 5.6 g/dL — ABNORMAL LOW (ref 6.5–8.1)

## 2022-09-17 LAB — URINALYSIS, COMPLETE (UACMP) WITH MICROSCOPIC
Bacteria, UA: NONE SEEN
Bilirubin Urine: NEGATIVE
Glucose, UA: NEGATIVE mg/dL
Hgb urine dipstick: NEGATIVE
Ketones, ur: NEGATIVE mg/dL
Nitrite: NEGATIVE
Protein, ur: NEGATIVE mg/dL
Specific Gravity, Urine: 1.009 (ref 1.005–1.030)
pH: 6 (ref 5.0–8.0)

## 2022-09-17 LAB — TSH: TSH: 2.714 u[IU]/mL (ref 0.350–4.500)

## 2022-09-17 MED ORDER — NYSTATIN 100000 UNIT/GM EX POWD: 1.0000 | Freq: Three times a day (TID) | CUTANEOUS | 0 refills | Status: DC

## 2022-09-17 NOTE — Telephone Encounter (Signed)
I called the patient to review her UA. Does not appear to be UTI. I also reviewed that I sent her a mychart message with instructions how to schedule her port-a-cath.

## 2022-09-18 ENCOUNTER — Encounter: Payer: Self-pay | Admitting: Radiation Oncology

## 2022-09-19 ENCOUNTER — Other Ambulatory Visit: Payer: Self-pay | Admitting: Physician Assistant

## 2022-09-19 DIAGNOSIS — N390 Urinary tract infection, site not specified: Secondary | ICD-10-CM

## 2022-09-19 LAB — URINE CULTURE: Culture: 20000 — AB

## 2022-09-19 LAB — T4: T4, Total: 8.7 ug/dL (ref 4.5–12.0)

## 2022-09-19 MED ORDER — NITROFURANTOIN MONOHYD MACRO 100 MG PO CAPS: 100.0000 mg | ORAL_CAPSULE | Freq: Two times a day (BID) | ORAL | 0 refills | Status: DC

## 2022-09-19 NOTE — Progress Notes (Signed)
I called the patient to review the urine culture. May be fecal contaminant. However, to be on the safe side, I am sending nitrofurantoin to the pharmacy. The patient expressed understanding.

## 2022-09-20 ENCOUNTER — Telehealth: Payer: Self-pay | Admitting: Internal Medicine

## 2022-09-20 NOTE — Telephone Encounter (Signed)
PT states she has spoken to [facility] in the past. She has seen Dr. Melvyn Novas and changed to Dr. Lamonte Sakai and now they were going to refer her somewhere. She was not sure what that referral was for.

## 2022-09-20 NOTE — Telephone Encounter (Signed)
Disregard last message on this telephone encounter.   Good one:  PT said she needs a letter she has been discharged from our practice. Not referred elsewhere but discharged by [place]. I rev the telephone encounter where RN told he she was not discharged at all but she is still insisting she was and that Practices "Have a letter for this." And it is usually sent certified mail. Other Dr's confirmed with her she was terminate from here, she states.  This harkens back to when she asked for another Dr. Here at [facility] and was ultimately told "We are not going to pull a 4th Dr. Into this." Thanks Triage.

## 2022-09-21 NOTE — Progress Notes (Incomplete)
  Radiation Oncology         [phone]) [phone] ________________________________  Patient Name: Laurie Mccoy MRN: [id number] DOB: 09-12-1941 Referring Physician: WHITE MARSHA (Profile Not Attached) Date of Service: 08/23/2022 [facility] [place]                                                        End Of Treatment Note  Diagnoses: C34.12-Malignant neoplasm of upper lobe, left bronchus or lung C79.31-Secondary malignant neoplasm of brain  Cancer Staging: Stage IIIb (T2 a, N3, M0) non-small cell lung cancer, adenocarcinoma presented with left upper lobe perihilar mass, left hilar and mediastinal lymphadenopathy, left supraclavicular lymphadenopathy, as well as new evidence of metastatic disease to the brain   Intent: Palliative  Radiation Treatment Dates: 07/10/2022 through 08/23/2022 Site Technique Total Dose (Gy) Dose per Fx (Gy) Completed Fx Beam Energies  Lung, Left: Lung_L 3D 60/60 2 30/30 6X, 10X  Brain: Brain_SRS IMRT 16/16 16 1/1 6XFFF   Narrative: The patient tolerated radiation therapy relatively well. During her final weekly treatment check on 08/20/22, the patient reported some pain in her left side/back during he final treatment (resolved on its own), fatigue, redness on her back, shortness of breath (resolved with nebulizer and antacid use), a produtive cough with beige sputum, mild dysphagia (used carafate), and blurry vision for several days near the end of her treatment. She otherwise denied any headaches, dizziness, or any green or blood-tinged mucus. Physical exam performed that same date showed some erythema to the left upper back region. No moist desquamation was appreciated.   Plan: The patient will follow-up with radiation oncology in one month .  ________________________________________________ -----------------------------------  Blair Promise, PhD, MD  This document serves as a record of services personally performed by Gery Pray, MD.  It was created on his behalf by Roney Mans, a trained medical scribe. The creation of this record is based on the scribe's personal observations and the provider's statements to them. This document has been checked and approved by the attending provider.

## 2022-09-22 NOTE — Progress Notes (Signed)
Radiation Oncology         [phone] ________________________________  Name: Laurie Mccoy MRN: [id number]  Date: 09/23/2022  DOB: 07-16-1941  Follow-Up Visit Note  CC: White, Delmar Landau, NP  [name], NP  No diagnosis found.  Diagnosis:  Stage IIIb (T2 a, N3, M0) non-small cell lung cancer, adenocarcinoma presented with left upper lobe perihilar mass, left hilar and mediastinal lymphadenopathy, left supraclavicular lymphadenopathy, as well as new evidence of metastatic disease to the brain    Interval Since Last Radiation: 1 month and 1 day   Intent: Palliative  Radiation Treatment Dates: 07/10/2022 through 08/23/2022 Site Technique Total Dose (Gy) Dose per Fx (Gy) Completed Fx Beam Energies  Lung, Left: Lung_L 3D 60/60 2 30/30 6X, 10X  Brain: Brain_SRS IMRT 16/16 16 1/1 6XFFF   Narrative:  The patient returns today for routine follow-up. The patient tolerated radiation therapy relatively well. During her final weekly treatment check on 08/20/22, the patient reported some pain in her left side/back during her final treatment (resolved on its own), fatigue, redness on her back, shortness of breath (resolved with nebulizer and antacid use), a produtive cough with beige sputum, mild dysphagia (used carafate), and blurry vision for several days near the end of her treatment. She otherwise denied any headaches, dizziness, or any green or blood-tinged mucus. Physical exam performed that same date showed some erythema to the left upper back region. No moist desquamation was appreciated.                Prior to initiating radiation to the left lung, the patient underwent pre-op SRS with Dr. Isidore Moos followed by resection of the right occipital mass on 07/11/22 under the care of Dr. Reatha Armour. 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    Post-op MRI of the brain on 07/13/22 showed no evidence of residual  tumor.  The patient met with Dr. Julien Nordmann on 07/22/22 to discuss systemic treatment options. Given her recent procedure, Dr. Julien Nordmann recommended waiting until the patient was 1 month post-op to initiate chemotherapy to allow an adequate time for wound healing.   During a follow-up visit with Dr. Isidore Moos on 08/13/22, the patient denied any seizures, headaches, nausea, new neurologic deficits, or visual changes. In regards to side effects from radiation to the left lung, the patient endorsed improvement in her esophagitis with Carafate use. She also also reported some dryness to her left cheek (prescribed fluconazole to treat thrush).           The patient presented to receive her first cycle of chemotherapy on 09/02/22. However shortly before starting treatment and receiving fluids, she began to endorse increased fatigue and SOB. Vitals were checked and she was found to be febrile at 100.7, prompting treatment to be held.   Given her symptoms, the patient sent to the ED that same date for further evaluation. Chest x-ray performed in the ED showed possible concern for pneumonia prompting admission (started on Vancomycin and Cefepime on admission).  She was also found to be hypoxic and required 2L supplemental O2 to keep her sats greater than 90%. Hospital course included 5 days of IV abx and a short course of steroids for acute bronchitis. Given persistence of dyspnea and continued supplemental O2 requirements, a CT of the chest was performed on 09/06/22 which demonstrated nonspecific development of patchy ground-glass in the LUL and RUL (LUL greater than RUL) thought to be related to recent radiotherapy or post treatment  related pneumonitis. CT also showed improvement of the metastatic lesion. 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  Julien Nordmann as scheduled. (She was discharged home on 09/09/22).   Dr. Julien Nordmann recommends waiting until the patient completes her prednisone taper to start treatment, which will likely be sometime after the new year. Dr. Julien Nordmann has also advised her to taper down her prednisone every 5 days instead of weekly.   Of note: during her most recent follow-up visit with Dr. Julien Nordmann on 09/17/22, the patient endorsed some vaginal irritation. Exam performed revealed some mild irritation suspected to be secondary to a yeast infection, and she was sent in a prescription for nystatin. Urinalysis and cultures were also submitted which did not show definite evidence of UTI (possible fecal contamination). However to be on the safe side, she has been sent an Rx for nitrofurantoin.   ***  Allergies:  is allergic to amoxicillin, compazine [prochlorperazine], norvasc [amlodipine], and trelegy ellipta [fluticasone-umeclidin-vilant].  Meds: Current Outpatient Medications  Medication Sig Dispense Refill   acetaminophen (TYLENOL) 500 MG tablet Take 500 mg by mouth every 6 (six) hours as needed for mild pain or headache.     albuterol (PROVENTIL) (2.5 MG/3ML) 0.083% nebulizer solution Take 3 mLs (2.5 mg total) by nebulization every 4 (four) hours as needed for wheezing or shortness of breath.     albuterol (VENTOLIN HFA) 108 (90 Base) MCG/ACT inhaler Inhale 2 puffs into the lungs every 6 (six) hours as needed for wheezing or shortness of breath. 18 g 1   ALPRAZolam (XANAX) 0.25 MG tablet Take 0.25 mg by mouth at bedtime as needed for anxiety.     Budeson-Glycopyrrol-Formoterol (BREZTRI AEROSPHERE) 160-9-4.8 MCG/ACT AERO INHALE 2 PUFFS BY MOUTH EVERY MORNING AND EVERY NIGHT AT BEDTIME (Patient taking differently: Inhale 2 puffs into the lungs in the morning and at bedtime.) 01.0 g 5   folic acid (FOLVITE) 1 MG tablet Take 1 tablet (1 mg total) by mouth daily. 30 tablet 2   guaiFENesin (MUCINEX) 600 MG 12 hr tablet Take 1  tablet (600 mg total) by mouth 2 (two) times daily.     ipratropium-albuterol (DUONEB) 0.5-2.5 (3) MG/3ML SOLN Take 3 mLs by nebulization every 6 (six) hours as needed. 360 mL 5   levocetirizine (XYZAL) 5 MG tablet Take 5 mg by mouth at bedtime as needed (for seasonal allergies- if not taking generic Claritin).     LORazepam (ATIVAN) 1 MG tablet Take 1 tablet (1 mg total) by mouth as needed. 30 min Prior to MRI or brain radiation procedure (Patient taking differently: Take 1 mg by mouth as needed (30 minutes prior to MRI or brain radiation procedure).) 5 tablet 0   metFORMIN (GLUCOPHAGE-XR) 500 MG 24 hr tablet Take 500 mg by mouth every morning.     nitrofurantoin, macrocrystal-monohydrate, (MACROBID) 100 MG capsule Take 1 capsule (100 mg total) by mouth 2 (two) times daily. 10 capsule 0   nystatin (MYCOSTATIN/NYSTOP) powder Apply 1 Application topically 3 (three) times daily. 30 g 0   ondansetron (ZOFRAN) 8 MG tablet Take 1 tablet (8 mg total) by mouth every 8 (eight) hours as needed for nausea or vomiting. 30 tablet 2   OVER THE COUNTER MEDICATION Apply 1 application  topically See admin instructions. Sonafine Wound Dressing Topical Emulsion- Apply to the back 1-2 times a day  pantoprazole (PROTONIX) 20 MG tablet Take 20 mg by mouth every evening.     predniSONE (DELTASONE) 10 MG tablet Prednisone 60 mg daily for 5 more days followed  Prednisone 50 mg daily for 7 days followed by  Prednisone 40 mg daily for 7 days followed by  Prednisone 30 mg daily for 7 days followed by  Prednisone 20 mg daily for 7 days followed by  Prednisone 10 mg daily for 7 days followed by  Prednisone 5 mg daily for 7 days and stop. 140 tablet 0   sucralfate (CARAFATE) 1 GM/10ML suspension Take 10 mLs (1 g total) by mouth 4 (four) times daily -  with meals and at bedtime. 420 mL 1   telmisartan (MICARDIS) 20 MG tablet Take 1 tablet (20 mg total) by mouth at bedtime. (Patient taking differently: Take 20 mg by mouth at  bedtime. Take 20 mg by mouth once a day if Systolic number is 937 or greater and Diastolic number is also elevated) 90 tablet 1   traMADol (ULTRAM) 50 MG tablet Take 2 tablets (100 mg total) by mouth every 6 (six) hours as needed for severe pain. (Patient taking differently: Take 50 mg by mouth every 6 (six) hours as needed for severe pain.) 30 tablet 0   No current facility-administered medications for this encounter.    Physical Findings: The patient is in no acute distress. Patient is alert and oriented.  vitals were not taken for this visit. .  No significant changes. Lungs are clear to auscultation bilaterally. Heart has regular rate and rhythm. No palpable cervical, supraclavicular, or axillary adenopathy. Abdomen soft, non-tender, normal bowel sounds.   Lab Findings: Lab Results  Component Value Date   WBC 4.9 09/17/2022   HGB 12.1 09/17/2022   HCT 36.8 09/17/2022   MCV 96.3 09/17/2022   PLT 193 09/17/2022    Radiographic Findings: CT CHEST W CONTRAST  Result Date: 09/06/2022 CLINICAL DATA:  History of suspected pneumonia. Also with history of non-small cell lung cancer. * Tracking Code: BO * EXAM: CT CHEST WITH CONTRAST TECHNIQUE: Multidetector CT imaging of the chest was performed during intravenous contrast administration. RADIATION DOSE REDUCTION: This exam was performed according to the departmental dose-optimization program which includes automated exposure control, adjustment of the mA and/or kV according to patient size and/or use of iterative reconstruction technique. CONTRAST:  . 2m OMNIPAQUE IOHEXOL 300 MG/ML  SOLN COMPARISON:  July 04, 2022 FINDINGS: Cardiovascular: Calcified aortic atherosclerotic changes. Noncalcified plaque as well. No signs of aneurysmal dilation of the thoracic aorta. Normal heart size without pericardial effusion or nodularity Central pulmonary vasculature is unremarkable to the extent evaluated on this venous phase assessment.  Mediastinum/Nodes: No signs of adenopathy in the chest. Esophagus grossly normal. Lungs/Pleura: Patchy ground-glass in the LEFT upper lobe and to a lesser extent in the RIGHT upper lobe as developed since previous imaging. Mass along the LEFT hilum abutting the major fissure is diminished in size now 2.0 x 1.3 cm previously 3.5 x 3.0 cm. Fiducial markers in the RIGHT upper lobe in the central portion of the subsolid nodule, area of also diminished in size measuring 12 mm as compared to 15 mm on the prior study. No signs of new nodule. Pulmonary emphysema is similar to prior imaging. There has been interval development also of a small LEFT-sided pleural effusion. Mild lingular scarring or consolidation is similar to prior imaging. Upper Abdomen: Post cholecystectomy. Signs of hepatic steatosis. Visualized liver otherwise unremarkable. Pancreas, spleen, adrenal glands and  kidneys unremarkable aside from pancreatic atrophy to the extent evaluated. No acute gastrointestinal findings in the upper abdomen. Musculoskeletal: No acute bone finding. No destructive bone process. Spinal degenerative changes. IMPRESSION: 1. Patchy ground-glass in the LEFT upper lobe and to a lesser extent in the RIGHT upper lobe as developed since previous imaging. Findings are nonspecific but could be related to recent radiotherapy or post treatment related pneumonitis. Correlate with respiratory symptoms and with any evidence of viral infection. No signs of classic lobar type pneumonia. 2. Interval development of a small LEFT-sided pleural effusion potentially related to above findings. 3. Diminished size of LEFT juxta hilar and Peri fissural mass in the LEFT chest compatible with response to therapy. 4. Diminishing size albeit slight decrease of a subsolid nodule in the RIGHT upper lobe. 5. Pulmonary emphysema is similar to prior imaging. 6. Signs of hepatic steatosis. 7. Post cholecystectomy. 8. Aortic atherosclerosis. Aortic Atherosclerosis  (ICD10-I70.0) and Emphysema (ICD10-J43.9). Electronically Signed   By: Zetta Bills M.D.   On: 09/06/2022 16:43   DG Chest 2 View  Result Date: 09/06/2022 CLINICAL DATA:  History of lung cancer. EXAM: CHEST - 2 VIEW COMPARISON:  September 02, 2022.  July 04, 2022. FINDINGS: Stable cardiomediastinal silhouette. Right lung is clear. Stable left lung density is noted consistent with malignancy. Bony thorax is unremarkable. IMPRESSION: Stable left lung density is noted consistent with malignancy. Electronically Signed   By: Marijo Conception M.D.   On: 09/06/2022 10:07   DG Chest Portable 1 View  Result Date: 09/02/2022 CLINICAL DATA:  Patient with non-small cell lung cancer with chills, shortness of breath, coughing, and fever EXAM: PORTABLE CHEST 1 VIEW COMPARISON:  CT chest dated 07/04/2022, chest radiograph dated 02/13/2022 FINDINGS: Normal lung volumes. Hazy left mid lung opacity in keeping with known malignancy. Dense left retrocardiac opacity. Metallic fiducial projects over the right mid lung. No pleural effusion or pneumothorax. Enlarged cardiomediastinal silhouette is likely projectional. The visualized skeletal structures are unremarkable. IMPRESSION: 1. Hazy left mid lung opacity in keeping with known malignancy. 2. Dense left retrocardiac opacity, which may represent atelectasis or pneumonia. Electronically Signed   By: Darrin Nipper M.D.   On: 09/02/2022 17:02    Impression:  Stage IIIb (T2 a, N3, M0) non-small cell lung cancer, adenocarcinoma presented with left upper lobe perihilar mass, left hilar and mediastinal lymphadenopathy, left supraclavicular lymphadenopathy, as well as new evidence of metastatic disease to the brain    The patient is recovering from the effects of radiation.  ***  Plan:  ***   *** minutes of total time was spent for this patient encounter, including preparation, face-to-face counseling with the patient and coordination of care, physical exam, and documentation  of the encounter. ____________________________________  Blair Promise, PhD, MD  This document serves as a record of services personally performed by Gery Pray, MD. It was created on his behalf by Roney Mans, a trained medical scribe. The creation of this record is based on the scribe's personal observations and the provider's statements to them. This document has been checked and approved by the attending provider.

## 2022-09-23 ENCOUNTER — Other Ambulatory Visit: Payer: Medicare Other

## 2022-09-23 ENCOUNTER — Ambulatory Visit: Payer: Medicare Other

## 2022-09-23 ENCOUNTER — Ambulatory Visit: Payer: Medicare Other | Admitting: Physician Assistant

## 2022-09-23 ENCOUNTER — Ambulatory Visit
Admission: RE | Admit: 2022-09-23 | Discharge: 2022-09-23 | Disposition: A | Discharge status: Home / Self Care | Payer: Medicare Other | Source: Ambulatory Visit | Attending: Radiation Oncology | Admitting: Radiation Oncology

## 2022-09-23 VITALS — BP 149/54 | HR 95 | Temp 97.9°F | Resp 24 | Ht 66.0 in | Wt 202.0 lb

## 2022-09-23 DIAGNOSIS — C3412 Malignant neoplasm of upper lobe, left bronchus or lung: Secondary | ICD-10-CM | POA: Diagnosis present

## 2022-09-23 DIAGNOSIS — C3490 Malignant neoplasm of unspecified part of unspecified bronchus or lung: Secondary | ICD-10-CM

## 2022-09-23 DIAGNOSIS — Z7984 Long term (current) use of oral hypoglycemic drugs: Secondary | ICD-10-CM | POA: Diagnosis not present

## 2022-09-23 DIAGNOSIS — J439 Emphysema, unspecified: Secondary | ICD-10-CM | POA: Diagnosis not present

## 2022-09-23 DIAGNOSIS — C7931 Secondary malignant neoplasm of brain: Secondary | ICD-10-CM | POA: Diagnosis not present

## 2022-09-23 DIAGNOSIS — Z7952 Long term (current) use of systemic steroids: Secondary | ICD-10-CM | POA: Insufficient documentation

## 2022-09-23 DIAGNOSIS — Z923 Personal history of irradiation: Secondary | ICD-10-CM | POA: Insufficient documentation

## 2022-09-23 DIAGNOSIS — C7951 Secondary malignant neoplasm of bone: Secondary | ICD-10-CM | POA: Insufficient documentation

## 2022-09-23 DIAGNOSIS — I700 Atherosclerosis of aorta: Secondary | ICD-10-CM | POA: Insufficient documentation

## 2022-09-23 DIAGNOSIS — Z79899 Other long term (current) drug therapy: Secondary | ICD-10-CM | POA: Diagnosis not present

## 2022-09-23 HISTORY — DX: Personal history of irradiation: Z92.3

## 2022-09-23 NOTE — Progress Notes (Signed)
Laurie Mccoy is here today for follow up post radiation to the lung.  Lung Side: Left,patient completed on 08/23/22.  Does the patient complain of any of the following: Pain: Right leg  Shortness of breath w/wo exertion: with and without exertion Cough: No Hemoptysis: No Pain with swallowing: No Swallowing/choking concerns: No Appetite: Good Energy Level: legs weak, energy level varies if she gets sleep or not Post radiation skin Changes: Improving Indigestion/ chest pain- states is was extreme/ took antiacid. States that she have not had the issue again    Additional comments if applicable:

## 2022-09-24 ENCOUNTER — Other Ambulatory Visit: Payer: Medicare Other

## 2022-09-24 NOTE — Telephone Encounter (Signed)
Spoke with the pt and advised, again,  that she was not d/c from our practive  She had requested to see Dr Lamonte Sakai, and he declined taking on pt and rec that she see Icard, Wert or be referred elsewhere Her oncologist has already referred to [facility]  She declined wanting to see any other provider in our practice if she can not see Dr Lamonte Sakai  She will call [facility] for f/u  Nothing further needed

## 2022-09-26 ENCOUNTER — Other Ambulatory Visit: Payer: Self-pay | Admitting: Cardiovascular Disease

## 2022-10-01 ENCOUNTER — Other Ambulatory Visit: Payer: Medicare Other

## 2022-10-04 MED FILL — Dexamethasone Sodium Phosphate Inj 100 MG/10ML: INTRAMUSCULAR | Qty: 1 | Status: AC

## 2022-10-04 MED FILL — Fosaprepitant Dimeglumine For IV Infusion 150 MG (Base Eq): INTRAVENOUS | Qty: 5 | Status: AC

## 2022-10-08 ENCOUNTER — Encounter: Payer: Self-pay | Admitting: Internal Medicine

## 2022-10-08 ENCOUNTER — Inpatient Hospital Stay: Payer: Medicare Other | Attending: Internal Medicine

## 2022-10-08 ENCOUNTER — Other Ambulatory Visit: Payer: Self-pay | Admitting: Physician Assistant

## 2022-10-08 ENCOUNTER — Other Ambulatory Visit: Payer: Self-pay

## 2022-10-08 ENCOUNTER — Inpatient Hospital Stay (HOSPITAL_BASED_OUTPATIENT_CLINIC_OR_DEPARTMENT_OTHER): Payer: Medicare Other | Admitting: Internal Medicine

## 2022-10-08 ENCOUNTER — Inpatient Hospital Stay: Payer: Medicare Other

## 2022-10-08 ENCOUNTER — Other Ambulatory Visit: Payer: Medicare Other

## 2022-10-08 VITALS — BP 132/76 | HR 99 | Temp 98.6°F | Resp 17 | Wt 203.8 lb

## 2022-10-08 VITALS — BP 133/72 | HR 86 | Resp 18

## 2022-10-08 DIAGNOSIS — F419 Anxiety disorder, unspecified: Secondary | ICD-10-CM | POA: Diagnosis not present

## 2022-10-08 DIAGNOSIS — C7932 Secondary malignant neoplasm of cerebral meninges: Secondary | ICD-10-CM

## 2022-10-08 DIAGNOSIS — R0602 Shortness of breath: Secondary | ICD-10-CM | POA: Insufficient documentation

## 2022-10-08 DIAGNOSIS — Z7952 Long term (current) use of systemic steroids: Secondary | ICD-10-CM | POA: Diagnosis not present

## 2022-10-08 DIAGNOSIS — Z888 Allergy status to other drugs, medicaments and biological substances status: Secondary | ICD-10-CM | POA: Insufficient documentation

## 2022-10-08 DIAGNOSIS — Z923 Personal history of irradiation: Secondary | ICD-10-CM | POA: Insufficient documentation

## 2022-10-08 DIAGNOSIS — Z79899 Other long term (current) drug therapy: Secondary | ICD-10-CM | POA: Diagnosis not present

## 2022-10-08 DIAGNOSIS — R5383 Other fatigue: Secondary | ICD-10-CM | POA: Insufficient documentation

## 2022-10-08 DIAGNOSIS — R0609 Other forms of dyspnea: Secondary | ICD-10-CM | POA: Diagnosis not present

## 2022-10-08 DIAGNOSIS — Z880 Allergy status to penicillin: Secondary | ICD-10-CM | POA: Diagnosis not present

## 2022-10-08 DIAGNOSIS — C3490 Malignant neoplasm of unspecified part of unspecified bronchus or lung: Secondary | ICD-10-CM

## 2022-10-08 DIAGNOSIS — Z23 Encounter for immunization: Secondary | ICD-10-CM

## 2022-10-08 DIAGNOSIS — C3412 Malignant neoplasm of upper lobe, left bronchus or lung: Secondary | ICD-10-CM | POA: Insufficient documentation

## 2022-10-08 DIAGNOSIS — C7931 Secondary malignant neoplasm of brain: Secondary | ICD-10-CM | POA: Diagnosis not present

## 2022-10-08 DIAGNOSIS — Z87442 Personal history of urinary calculi: Secondary | ICD-10-CM | POA: Diagnosis not present

## 2022-10-08 DIAGNOSIS — Z9049 Acquired absence of other specified parts of digestive tract: Secondary | ICD-10-CM | POA: Diagnosis not present

## 2022-10-08 DIAGNOSIS — Z5112 Encounter for antineoplastic immunotherapy: Secondary | ICD-10-CM | POA: Diagnosis not present

## 2022-10-08 DIAGNOSIS — Z5111 Encounter for antineoplastic chemotherapy: Secondary | ICD-10-CM | POA: Diagnosis present

## 2022-10-08 LAB — CMP (CANCER CENTER ONLY)
ALT: 14 U/L (ref 0–44)
AST: 10 U/L — ABNORMAL LOW (ref 15–41)
Albumin: 4.1 g/dL (ref 3.5–5.0)
Alkaline Phosphatase: 52 U/L (ref 38–126)
Anion gap: 9 (ref 5–15)
BUN: 14 mg/dL (ref 8–23)
CO2: 25 mmol/L (ref 22–32)
Calcium: 9.6 mg/dL (ref 8.9–10.3)
Chloride: 102 mmol/L (ref 98–111)
Creatinine: 0.59 mg/dL (ref 0.44–1.00)
GFR, Estimated: >60 mL/min (ref >60)
Glucose, Bld: 126 mg/dL — ABNORMAL HIGH (ref 70–99)
Potassium: 4.3 mmol/L (ref 3.5–5.1)
Sodium: 136 mmol/L (ref 135–145)
Total Bilirubin: 0.6 mg/dL (ref 0.3–1.2)
Total Protein: 6.6 g/dL (ref 6.5–8.1)

## 2022-10-08 LAB — CBC WITH DIFFERENTIAL (CANCER CENTER ONLY)
Abs Immature Granulocytes: 0.04 10*3/uL (ref 0.00–0.07)
Basophils Absolute: 0.1 10*3/uL (ref 0.0–0.1)
Basophils Relative: 1 %
Eosinophils Absolute: 0 10*3/uL (ref 0.0–0.5)
Eosinophils Relative: 0 %
HCT: 39.2 % (ref 36.0–46.0)
Hemoglobin: 13.1 g/dL (ref 12.0–15.0)
Immature Granulocytes: 1 %
Lymphocytes Relative: 7 %
Lymphs Abs: 0.5 10*3/uL — ABNORMAL LOW (ref 0.7–4.0)
MCH: 31.7 pg (ref 26.0–34.0)
MCHC: 33.4 g/dL (ref 30.0–36.0)
MCV: 94.9 fL (ref 80.0–100.0)
Monocytes Absolute: 0.2 10*3/uL (ref 0.1–1.0)
Monocytes Relative: 3 %
Neutro Abs: 6.7 10*3/uL (ref 1.7–7.7)
Neutrophils Relative %: 88 %
Platelet Count: 192 10*3/uL (ref 150–400)
RBC: 4.13 MIL/uL (ref 3.87–5.11)
RDW: 14.2 % (ref 11.5–15.5)
WBC Count: 7.6 10*3/uL (ref 4.0–10.5)
nRBC: 0 % (ref 0.0–0.2)

## 2022-10-08 LAB — VITAMIN B12: Vitamin B-12: 493 pg/mL (ref 180–914)

## 2022-10-08 MED ORDER — SODIUM CHLORIDE 0.9 % IV SOLN: Freq: Once | INTRAVENOUS | Status: AC

## 2022-10-08 MED ORDER — SODIUM CHLORIDE 0.9 % IV SOLN
10.0000 mg | Freq: Once | INTRAVENOUS | Status: AC
  Administered 2022-10-08: 10 mg via INTRAVENOUS
  Filled 2022-10-08: qty 10

## 2022-10-08 MED ORDER — PALONOSETRON HCL INJECTION 0.25 MG/5ML
0.2500 mg | Freq: Once | INTRAVENOUS | Status: AC
  Administered 2022-10-08: 0.25 mg via INTRAVENOUS
  Filled 2022-10-08: qty 5

## 2022-10-08 MED ORDER — SODIUM CHLORIDE 0.9 % IV SOLN
455.5000 mg | Freq: Once | INTRAVENOUS | Status: AC
  Administered 2022-10-08: 460 mg via INTRAVENOUS
  Filled 2022-10-08: qty 46

## 2022-10-08 MED ORDER — SODIUM CHLORIDE 0.9 % IV SOLN
150.0000 mg | Freq: Once | INTRAVENOUS | Status: AC
  Administered 2022-10-08: 150 mg via INTRAVENOUS
  Filled 2022-10-08: qty 150

## 2022-10-08 MED ORDER — SODIUM CHLORIDE 0.9 % IV SOLN
200.0000 mg | Freq: Once | INTRAVENOUS | Status: AC
  Administered 2022-10-08: 200 mg via INTRAVENOUS
  Filled 2022-10-08: qty 8

## 2022-10-08 MED ORDER — INFLUENZA VAC SPLIT QUAD 0.5 ML IM SUSY
0.5000 mL | PREFILLED_SYRINGE | Freq: Once | INTRAMUSCULAR | Status: AC
  Administered 2022-10-08: 0.5 mL via INTRAMUSCULAR
  Filled 2022-10-08: qty 0.5

## 2022-10-08 MED ORDER — SODIUM CHLORIDE 0.9 % IV SOLN
500.0000 mg/m2 | Freq: Once | INTRAVENOUS | Status: AC
  Administered 2022-10-08: 1000 mg via INTRAVENOUS
  Filled 2022-10-08: qty 40

## 2022-10-08 NOTE — Progress Notes (Signed)
[facility] [phone]   [phone]  OFFICE PROGRESS NOTE  [name], NP [place] [address] [place] [address]  DIAGNOSIS: Stage IV (T2 a, N3, M1b) non-small cell lung cancer, adenocarcinoma presented with left upper lobe perihilar mass in addition to left hilar and mediastinal lymphadenopathy as well as left supraclavicular lymphadenopathy with brain metastasis diagnosed in September 2023.   [name]  Detected Alteration(s) / Biomarker(s) Associated FDA-approved therapies Clinical Trial Availability % cfDNA or Amplification  KRAS G12C approved by FDA Adagrasib, Sotorasib Yes 1.7%  TP53 R249S None Yes 1.5%  PRIOR THERAPY: 1) SRS the the metastatic brain lesion under the care of Dr. Isidore Moos 2) Craniotomy on 07/10/22 under the care of Dr. Reatha Armour 3) radiation to the chest under care of Dr. Sondra Come.  Last dose on 08/23/2022   CURRENT THERAPY: Systemic chemotherapy with carboplatin for an AUC of 5, to 500 mg/m2, and Keytruda 200 mg IV every 3 weeks.  First dose expected on October 08, 2022.    INTERVAL HISTORY: [name] 82 y.o. female returns to the clinic today for follow-up visit accompanied by her husband.  The patient is feeling fine today with no concerning complaints except for the baseline fatigue as well as shortness of breath with exertion and weakness in the proximal muscles of her lower extremities after being on long course of steroids.  She is currently on prednisone 20 mg p.o. daily.  She denied having any chest pain, cough or hemoptysis.  She has no nausea, vomiting, diarrhea or constipation.  She has no headache or visual changes.  She has no recent weight loss or night sweats.  She is here today for evaluation before starting the first cycle of her systemic chemotherapy.   MEDICAL HISTORY: Past Medical History:  Diagnosis Date   Anxiety    "had panic attacks years ago"   Asthma    COPD (chronic obstructive pulmonary  disease) ([place])    Goiter 2022   History of hiatal hernia    "dx in college, never bothered me"   History of kidney stones 2012   History of radiation therapy    Left Lung-07/10/22-08/23/22- Dr. Gery Pray   Hypertension    Lung cancer [facility])    Sleep apnea     ALLERGIES:  is allergic to amoxicillin, compazine [prochlorperazine], norvasc [amlodipine], and trelegy ellipta [fluticasone-umeclidin-vilant].  MEDICATIONS:  Current Outpatient Medications  Medication Sig Dispense Refill   acetaminophen (TYLENOL) 500 MG tablet Take 500 mg by mouth every 6 (six) hours as needed for mild pain or headache.     albuterol (PROVENTIL) (2.5 MG/3ML) 0.083% nebulizer solution Take 3 mLs (2.5 mg total) by nebulization every 4 (four) hours as needed for wheezing or shortness of breath.     albuterol (VENTOLIN HFA) 108 (90 Base) MCG/ACT inhaler Inhale 2 puffs into the lungs every 6 (six) hours as needed for wheezing or shortness of breath. 18 g 1   ALPRAZolam (XANAX) 0.25 MG tablet Take 0.25 mg by mouth at bedtime as needed for anxiety.     Budeson-Glycopyrrol-Formoterol (BREZTRI AEROSPHERE) 160-9-4.8 MCG/ACT AERO INHALE 2 PUFFS BY MOUTH EVERY MORNING AND EVERY NIGHT AT BEDTIME (Patient taking differently: Inhale 2 puffs into the lungs in the morning and at bedtime.) 74.1 g 5   folic acid (FOLVITE) 1 MG tablet Take 1 tablet (1 mg total) by mouth daily. 30 tablet 2   guaiFENesin (MUCINEX) 600 MG 12 hr tablet Take 1  tablet (600 mg total) by mouth 2 (two) times daily.     ipratropium-albuterol (DUONEB) 0.5-2.5 (3) MG/3ML SOLN Take 3 mLs by nebulization every 6 (six) hours as needed. 360 mL 5   levocetirizine (XYZAL) 5 MG tablet Take 5 mg by mouth at bedtime as needed (for seasonal allergies- if not taking generic Claritin).     LORazepam (ATIVAN) 1 MG tablet Take 1 tablet (1 mg total) by mouth as needed. 30 min Prior to MRI or brain radiation procedure (Patient taking differently: Take 1 mg by mouth as needed (30  minutes prior to MRI or brain radiation procedure).) 5 tablet 0   metFORMIN (GLUCOPHAGE-XR) 500 MG 24 hr tablet Take 500 mg by mouth every morning.     nitrofurantoin, macrocrystal-monohydrate, (MACROBID) 100 MG capsule Take 1 capsule (100 mg total) by mouth 2 (two) times daily. 10 capsule 0   nystatin (MYCOSTATIN/NYSTOP) powder Apply 1 Application topically 3 (three) times daily. 30 g 0   ondansetron (ZOFRAN) 8 MG tablet Take 1 tablet (8 mg total) by mouth every 8 (eight) hours as needed for nausea or vomiting. 30 tablet 2   OVER THE COUNTER MEDICATION Apply 1 application  topically See admin instructions. Sonafine Wound Dressing Topical Emulsion- Apply to the back 1-2 times a day     pantoprazole (PROTONIX) 20 MG tablet Take 20 mg by mouth every evening.     predniSONE (DELTASONE) 10 MG tablet Prednisone 60 mg daily for 5 more days followed  Prednisone 50 mg daily for 7 days followed by  Prednisone 40 mg daily for 7 days followed by  Prednisone 30 mg daily for 7 days followed by  Prednisone 20 mg daily for 7 days followed by  Prednisone 10 mg daily for 7 days followed by  Prednisone 5 mg daily for 7 days and stop. 140 tablet 0   telmisartan (MICARDIS) 20 MG tablet TAKE 1 TABLET AT BEDTIME 30 tablet 1   traMADol (ULTRAM) 50 MG tablet Take 2 tablets (100 mg total) by mouth every 6 (six) hours as needed for severe pain. (Patient taking differently: Take 50 mg by mouth every 6 (six) hours as needed for severe pain.) 30 tablet 0   No current facility-administered medications for this visit.    SURGICAL HISTORY:  Past Surgical History:  Procedure Laterality Date   APPENDECTOMY     APPLICATION OF CRANIAL NAVIGATION N/A 07/11/2022   Procedure: APPLICATION OF CRANIAL NAVIGATION;  Surgeon: Dawley, Theodoro Doing, DO;  Location: [facility];  Service: Neurosurgery;  Laterality: N/A;   BRONCHIAL BIOPSY  06/12/2021   Procedure: BRONCHIAL BIOPSIES;  Surgeon: Garner Nash, DO;  Location: [facility];  Service:  Pulmonary;;   BRONCHIAL BRUSHINGS  06/12/2021   Procedure: BRONCHIAL BRUSHINGS;  Surgeon: Garner Nash, DO;  Location: [facility];  Service: Pulmonary;;   BRONCHIAL WASHINGS  06/12/2021   Procedure: BRONCHIAL WASHINGS;  Surgeon: Garner Nash, DO;  Location: [facility];  Service: Pulmonary;;   CHOLECYSTECTOMY     COLON RESECTION     CRANIOTOMY N/A 07/11/2022   Procedure: Craniotomy for resection of tumor;  Surgeon: Karsten Ro, DO;  Location: [facility];  Service: Neurosurgery;  Laterality: N/A;  RM 19   FIDUCIAL MARKER PLACEMENT  06/12/2021   Procedure: FIDUCIAL MARKER PLACEMENT;  Surgeon: Garner Nash, DO;  Location: [facility];  Service: Pulmonary;;   hernia     x 2   TONSILLECTOMY     VIDEO BRONCHOSCOPY WITH ENDOBRONCHIAL NAVIGATION Bilateral 06/12/2021  Procedure: VIDEO BRONCHOSCOPY WITH ENDOBRONCHIAL NAVIGATION;  Surgeon: Garner Nash, DO;  Location: [facility];  Service: Pulmonary;  Laterality: Bilateral;  ION   VIDEO BRONCHOSCOPY WITH RADIAL ENDOBRONCHIAL ULTRASOUND  06/12/2021   Procedure: RADIAL ENDOBRONCHIAL ULTRASOUND;  Surgeon: Garner Nash, DO;  Location: [facility];  Service: Pulmonary;;    REVIEW OF SYSTEMS:  Constitutional: positive for fatigue Eyes: negative Ears, nose, mouth, throat, and face: negative Respiratory: positive for dyspnea on exertion Cardiovascular: negative Gastrointestinal: negative Genitourinary:negative Integument/breast: negative Hematologic/lymphatic: negative Musculoskeletal:negative Neurological: negative Behavioral/Psych: negative Endocrine: negative Allergic/Immunologic: negative   PHYSICAL EXAMINATION: General appearance: alert, cooperative, fatigued, and no distress Head: Normocephalic, without obvious abnormality, atraumatic Neck: no adenopathy, no JVD, supple, symmetrical, trachea midline, and thyroid not enlarged, symmetric, no tenderness/mass/nodules Lymph nodes: Cervical, supraclavicular, and axillary  nodes normal. Resp: clear to auscultation bilaterally Back: symmetric, no curvature. ROM normal. No CVA tenderness. Cardio: regular rate and rhythm, S1, S2 normal, no murmur, click, rub or gallop GI: soft, non-tender; bowel sounds normal; no masses,  no organomegaly Extremities: extremities normal, atraumatic, no cyanosis or edema Neurologic: Alert and oriented X 3, normal strength and tone. Normal symmetric reflexes. Normal coordination and gait  ECOG PERFORMANCE STATUS: 1 - Symptomatic but completely ambulatory  Blood pressure 132/76, pulse 99, temperature 98.6 F (37 C), temperature source Oral, resp. rate 17, weight 203 lb 12.8 oz (92.4 kg), SpO2 97 %.  LABORATORY DATA: Lab Results  Component Value Date   WBC 7.6 10/08/2022   HGB 13.1 10/08/2022   HCT 39.2 10/08/2022   MCV 94.9 10/08/2022   PLT 192 10/08/2022      Chemistry      Component Value Date/Time   NA 140 09/17/2022 0925   NA 139 09/17/2019 1550   K 3.8 09/17/2022 0925   CL 104 09/17/2022 0925   CO2 27 09/17/2022 0925   BUN 16 09/17/2022 0925   BUN 9 09/17/2019 1550   CREATININE 0.57 09/17/2022 0925      Component Value Date/Time   CALCIUM 8.9 09/17/2022 0925   ALKPHOS 55 09/17/2022 0925   AST 10 (L) 09/17/2022 0925   ALT 16 09/17/2022 0925   BILITOT 0.6 09/17/2022 0925       RADIOGRAPHIC STUDIES: No results found.  ASSESSMENT AND PLAN: This is a very pleasant 82 years old white female with likely stage IV (T2a, N3, M1B) non-small cell lung cancer, adenocarcinoma presented with left upper lobe perihilar mass in addition to left hilar and mediastinal lymphadenopathy as well as left supraclavicular lymphadenopathy and suspicious solitary brain metastasis diagnosed in September 2023. The molecular studies showed positive KRAS G12C mutation The patient underwent SRS followed by craniotomy on July 10, 2022 to the solitary brain metastasis.  She also underwent palliative radiotherapy to the left upper lobe  perihilar mass and mediastinal lymphadenopathy under the care of Dr. Sondra Come. The patient is currently on systemic chemotherapy with carboplatin for AUC of 5, Alimta 500 Mg/M2 and Keytruda 200 Mg IV every 3 weeks.  First dose October 08, 2022.  She is feeling much better today and I recommended for her to proceed with the treatment as planned. For the steroid taper, she is currently on prednisone 20 mg p.o. daily.  Starting from tomorrow she will reduce her dose to 10 mg p.o. daily. I will see the patient back for follow-up visit in 3 weeks for evaluation before restarting cycle #2. She was advised to call immediately if she has any other concerning symptoms in the interval.  The patient voices understanding of current disease status and treatment options and is in agreement with the current care plan.  All questions were answered. The patient knows to call the clinic with any problems, questions or concerns. We can certainly see the patient much sooner if necessary.  The total time spent in the appointment was 30 minutes.  Disclaimer: This note was dictated with voice recognition software. Similar sounding words can inadvertently be transcribed and may not be corrected upon review.

## 2022-10-08 NOTE — Patient Instructions (Signed)
[facility]  Discharge Instructions: Thank you for choosing [facility] to provide your oncology and hematology care.   If you have a lab appointment with the [facility], please go directly to the [facility] and check in at the registration area.   Wear comfortable clothing and clothing appropriate for easy access to any Portacath or PICC line.   We strive to give you quality time with your provider. You may need to reschedule your appointment if you arrive late (15 or more minutes).  Arriving late affects you and other patients whose appointments are after yours.  Also, if you miss three or more appointments without notifying the office, you may be dismissed from the clinic at the provider's discretion.      For prescription refill requests, have your pharmacy contact our office and allow 72 hours for refills to be completed.    Today you received the following chemotherapy and/or immunotherapy agents: Keytruda, Alimta, and Carboplatin      To help prevent nausea and vomiting after your treatment, we encourage you to take your nausea medication as directed.  BELOW ARE SYMPTOMS THAT SHOULD BE REPORTED IMMEDIATELY: *FEVER GREATER THAN 100.4 F (38 C) OR HIGHER *CHILLS OR SWEATING *NAUSEA AND VOMITING THAT IS NOT CONTROLLED WITH YOUR NAUSEA MEDICATION *UNUSUAL SHORTNESS OF BREATH *UNUSUAL BRUISING OR BLEEDING *URINARY PROBLEMS (pain or burning when urinating, or frequent urination) *BOWEL PROBLEMS (unusual diarrhea, constipation, pain near the anus) TENDERNESS IN MOUTH AND THROAT WITH OR WITHOUT PRESENCE OF ULCERS (sore throat, sores in mouth, or a toothache) UNUSUAL RASH, SWELLING OR PAIN  UNUSUAL VAGINAL DISCHARGE OR ITCHING   Items with * indicate a potential emergency and should be followed up as soon as possible or go to the Emergency Department if any problems should occur.  Please show the CHEMOTHERAPY ALERT CARD or IMMUNOTHERAPY  ALERT CARD at check-in to the Emergency Department and triage nurse.  Should you have questions after your visit or need to cancel or reschedule your appointment, please contact [facility]  Dept: [phone]  and follow the prompts.  Office hours are 8:00 a.m. to 4:30 p.m. Monday - Friday. Please note that voicemails left after 4:00 p.m. may not be returned until the following business day.  We are closed weekends and major holidays. You have access to a nurse at all times for urgent questions. Please call the main number to the clinic Dept: [phone] and follow the prompts.   For any non-urgent questions, you may also contact your provider using MyChart. We now offer e-Visits for anyone 88 and older to request care online for non-urgent symptoms. For details visit [email or web address].   Also download the MyChart app! Go to the app store, search "MyChart", open the app, select [facility], and log in with your MyChart username and password.  Pembrolizumab Injection What is this medication? PEMBROLIZUMAB (PEM broe LIZ ue mab) treats some types of cancer. It works by helping your immune system slow or stop the spread of cancer cells. It is a monoclonal antibody. This medicine may be used for other purposes; ask your health care provider or pharmacist if you have questions. COMMON BRAND NAME(S): Keytruda What should I tell my care team before I take this medication? They need to know if you have any of these conditions: Allogeneic stem cell transplant (uses someone else's stem cells) Autoimmune diseases, such as Crohn disease, ulcerative colitis, lupus History of chest radiation Nervous system  problems, such as Guillain-Barre syndrome, myasthenia gravis Organ transplant An unusual or allergic reaction to pembrolizumab, other medications, foods, dyes, or preservatives Pregnant or trying to get pregnant Breast-feeding How should I use this medication? This  medication is injected into a vein. It is given by your care team in a hospital or clinic setting. A special MedGuide will be given to you before each treatment. Be sure to read this information carefully each time. Talk to your care team about the use of this medication in children. While it may be prescribed for children as young as 6 months for selected conditions, precautions do apply. Overdosage: If you think you have taken too much of this medicine contact a poison control center or emergency room at once. NOTE: This medicine is only for you. Do not share this medicine with others. What if I miss a dose? Keep appointments for follow-up doses. It is important not to miss your dose. Call your care team if you are unable to keep an appointment. What may interact with this medication? Interactions have not been studied. This list may not describe all possible interactions. Give your health care provider a list of all the medicines, herbs, non-prescription drugs, or dietary supplements you use. Also tell them if you smoke, drink alcohol, or use illegal drugs. Some items may interact with your medicine. What should I watch for while using this medication? Your condition will be monitored carefully while you are receiving this medication. You may need blood work while taking this medication. This medication may cause serious skin reactions. They can happen weeks to months after starting the medication. Contact your care team right away if you notice fevers or flu-like symptoms with a rash. The rash may be red or purple and then turn into blisters or peeling of the skin. You may also notice a red rash with swelling of the face, lips, or lymph nodes in your neck or under your arms. Tell your care team right away if you have any change in your eyesight. Talk to your care team if you may be pregnant. Serious birth defects can occur if you take this medication during pregnancy and for 4 months after the last  dose. You will need a negative pregnancy test before starting this medication. Contraception is recommended while taking this medication and for 4 months after the last dose. Your care team can help you find the option that works for you. Do not breastfeed while taking this medication and for 4 months after the last dose. What side effects may I notice from receiving this medication? Side effects that you should report to your care team as soon as possible: Allergic reactions--skin rash, itching, hives, swelling of the face, lips, tongue, or throat Dry cough, shortness of breath or trouble breathing Eye pain, redness, irritation, or discharge with blurry or decreased vision Heart muscle inflammation--unusual weakness or fatigue, shortness of breath, chest pain, fast or irregular heartbeat, dizziness, swelling of the ankles, feet, or hands Hormone gland problems--headache, sensitivity to light, unusual weakness or fatigue, dizziness, fast or irregular heartbeat, increased sensitivity to cold or heat, excessive sweating, constipation, hair loss, increased thirst or amount of urine, tremors or shaking, irritability Infusion reactions--chest pain, shortness of breath or trouble breathing, feeling faint or lightheaded Kidney injury (glomerulonephritis)--decrease in the amount of urine, red or dark brown urine, foamy or bubbly urine, swelling of the ankles, hands, or feet Liver injury--right upper belly pain, loss of appetite, nausea, light-colored stool, dark yellow or brown  urine, yellowing skin or eyes, unusual weakness or fatigue Pain, tingling, or numbness in the hands or feet, muscle weakness, change in vision, confusion or trouble speaking, loss of balance or coordination, trouble walking, seizures Rash, fever, and swollen lymph nodes Redness, blistering, peeling, or loosening of the skin, including inside the mouth Sudden or severe stomach pain, bloody diarrhea, fever, nausea, vomiting Side effects  that usually do not require medical attention (report to your care team if they continue or are bothersome): Bone, joint, or muscle pain Diarrhea Fatigue Loss of appetite Nausea Skin rash This list may not describe all possible side effects. Call your doctor for medical advice about side effects. You may report side effects to FDA at 1-800-FDA-1088. Where should I keep my medication? This medication is given in a hospital or clinic. It will not be stored at home. NOTE: This sheet is a summary. It may not cover all possible information. If you have questions about this medicine, talk to your doctor, pharmacist, or health care provider.  2023 Elsevier/Gold Standard (2013-06-14 00:00:00) Pemetrexed Injection What is this medication? PEMETREXED (PEM e TREX ed) treats some types of cancer. It works by slowing down the growth of cancer cells. This medicine may be used for other purposes; ask your health care provider or pharmacist if you have questions. COMMON BRAND NAME(S): Alimta, PEMFEXY What should I tell my care team before I take this medication? They need to know if you have any of these conditions: Infection, such as chickenpox, cold sores, or herpes Kidney disease Low blood cell levels (white cells, red cells, and platelets) Lung or breathing disease, such as asthma Radiation therapy An unusual or allergic reaction to pemetrexed, other medications, foods, dyes, or preservatives If you or your partner are pregnant or trying to get pregnant Breast-feeding How should I use this medication? This medication is injected into a vein. It is given by your care team in a hospital or clinic setting. Talk to your care team about the use of this medication in children. Special care may be needed. Overdosage: If you think you have taken too much of this medicine contact a poison control center or emergency room at once. NOTE: This medicine is only for you. Do not share this medicine with  others. What if I miss a dose? Keep appointments for follow-up doses. It is important not to miss your dose. Call your care team if you are unable to keep an appointment. What may interact with this medication? Do not take this medication with any of the following: Live virus vaccines This medication may also interact with the following: Ibuprofen This list may not describe all possible interactions. Give your health care provider a list of all the medicines, herbs, non-prescription drugs, or dietary supplements you use. Also tell them if you smoke, drink alcohol, or use illegal drugs. Some items may interact with your medicine. What should I watch for while using this medication? Your condition will be monitored carefully while you are receiving this medication. This medication may make you feel generally unwell. This is not uncommon as chemotherapy can affect healthy cells as well as cancer cells. Report any side effects. Continue your course of treatment even though you feel ill unless your care team tells you to stop. This medication can cause serious side effects. To reduce the risk, your care team may give you other medications to take before receiving this one. Be sure to follow the directions from your care team. This medication can cause  a rash or redness in areas of the body that have previously had radiation therapy. If you have had radiation therapy, tell your care team if you notice a rash in this area. This medication may increase your risk of getting an infection. Call your care team for advice if you get a fever, chills, sore throat, or other symptoms of a cold or flu. Do not treat yourself. Try to avoid being around people who are sick. Be careful brushing or flossing your teeth or using a toothpick because you may get an infection or bleed more easily. If you have any dental work done, tell your dentist you are receiving this medication. Avoid taking medications that contain  aspirin, acetaminophen, ibuprofen, naproxen, or ketoprofen unless instructed by your care team. These medications may hide a fever. Check with your care team if you have severe diarrhea, nausea, and vomiting, or if you sweat a lot. The loss of too much body fluid may make it dangerous for you to take this medication. Talk to your care team if you or your partner wish to become pregnant or think either of you might be pregnant. This medication can cause serious birth defects if taken during pregnancy and for 6 months after the last dose. A negative pregnancy test is required before starting this medication. A reliable form of contraception is recommended while taking this medication and for 6 months after the last dose. Talk to your care team about reliable forms of contraception. Do not father a child while taking this medication and for 3 months after the last dose. Use a condom while having sex during this time period. Do not breastfeed while taking this medication and for 1 week after the last dose. This medication may cause infertility. Talk to your care team if you are concerned about your fertility. What side effects may I notice from receiving this medication? Side effects that you should report to your care team as soon as possible: Allergic reactions--skin rash, itching, hives, swelling of the face, lips, tongue, or throat Dry cough, shortness of breath or trouble breathing Infection--fever, chills, cough, sore throat, wounds that don't heal, pain or trouble when passing urine, general feeling of discomfort or being unwell Kidney injury--decrease in the amount of urine, swelling of the ankles, hands, or feet Low red blood cell level--unusual weakness or fatigue, dizziness, headache, trouble breathing Redness, blistering, peeling, or loosening of the skin, including inside the mouth Unusual bruising or bleeding Side effects that usually do not require medical attention (report to your care team  if they continue or are bothersome): Fatigue Loss of appetite Nausea Vomiting This list may not describe all possible side effects. Call your doctor for medical advice about side effects. You may report side effects to FDA at 1-800-FDA-1088. Where should I keep my medication? This medication is given in a hospital or clinic. It will not be stored at home. NOTE: This sheet is a summary. It may not cover all possible information. If you have questions about this medicine, talk to your doctor, pharmacist, or health care provider.  2023 Elsevier/Gold Standard (2022-01-28 00:00:00) Carboplatin Injection What is this medication? CARBOPLATIN (KAR boe pla tin) treats some types of cancer. It works by slowing down the growth of cancer cells. This medicine may be used for other purposes; ask your health care provider or pharmacist if you have questions. COMMON BRAND NAME(S): Paraplatin What should I tell my care team before I take this medication? They need to know if you  have any of these conditions: Blood disorders Hearing problems Kidney disease Recent or ongoing radiation therapy An unusual or allergic reaction to carboplatin, cisplatin, other medications, foods, dyes, or preservatives Pregnant or trying to get pregnant Breast-feeding How should I use this medication? This medication is injected into a vein. It is given by your care team in a hospital or clinic setting. Talk to your care team about the use of this medication in children. Special care may be needed. Overdosage: If you think you have taken too much of this medicine contact a poison control center or emergency room at once. NOTE: This medicine is only for you. Do not share this medicine with others. What if I miss a dose? Keep appointments for follow-up doses. It is important not to miss your dose. Call your care team if you are unable to keep an appointment. What may interact with this medication? Medications for  seizures Some antibiotics, such as amikacin, gentamicin, neomycin, streptomycin, tobramycin Vaccines This list may not describe all possible interactions. Give your health care provider a list of all the medicines, herbs, non-prescription drugs, or dietary supplements you use. Also tell them if you smoke, drink alcohol, or use illegal drugs. Some items may interact with your medicine. What should I watch for while using this medication? Your condition will be monitored carefully while you are receiving this medication. You may need blood work while taking this medication. This medication may make you feel generally unwell. This is not uncommon, as chemotherapy can affect healthy cells as well as cancer cells. Report any side effects. Continue your course of treatment even though you feel ill unless your care team tells you to stop. In some cases, you may be given additional medications to help with side effects. Follow all directions for their use. This medication may increase your risk of getting an infection. Call your care team for advice if you get a fever, chills, sore throat, or other symptoms of a cold or flu. Do not treat yourself. Try to avoid being around people who are sick. Avoid taking medications that contain aspirin, acetaminophen, ibuprofen, naproxen, or ketoprofen unless instructed by your care team. These medications may hide a fever. Be careful brushing or flossing your teeth or using a toothpick because you may get an infection or bleed more easily. If you have any dental work done, tell your dentist you are receiving this medication. Talk to your care team if you wish to become pregnant or think you might be pregnant. This medication can cause serious birth defects. Talk to your care team about effective forms of contraception. Do not breast-feed while taking this medication. What side effects may I notice from receiving this medication? Side effects that you should report to your  care team as soon as possible: Allergic reactions--skin rash, itching, hives, swelling of the face, lips, tongue, or throat Infection--fever, chills, cough, sore throat, wounds that don't heal, pain or trouble when passing urine, general feeling of discomfort or being unwell Low red blood cell level--unusual weakness or fatigue, dizziness, headache, trouble breathing Pain, tingling, or numbness in the hands or feet, muscle weakness, change in vision, confusion or trouble speaking, loss of balance or coordination, trouble walking, seizures Unusual bruising or bleeding Side effects that usually do not require medical attention (report to your care team if they continue or are bothersome): Hair loss Nausea Unusual weakness or fatigue Vomiting This list may not describe all possible side effects. Call your doctor for medical advice about  side effects. You may report side effects to FDA at 1-800-FDA-1088. Where should I keep my medication? This medication is given in a hospital or clinic. It will not be stored at home. NOTE: This sheet is a summary. It may not cover all possible information. If you have questions about this medicine, talk to your doctor, pharmacist, or health care provider.  2023 Elsevier/Gold Standard (2022-01-07 00:00:00)

## 2022-10-09 ENCOUNTER — Telehealth: Payer: Self-pay | Admitting: Medical Oncology

## 2022-10-09 ENCOUNTER — Encounter: Payer: Self-pay | Admitting: Medical Oncology

## 2022-10-09 ENCOUNTER — Telehealth: Payer: Self-pay

## 2022-10-09 NOTE — Telephone Encounter (Signed)
Returned call re her new tx. She "... feels great . The whole process was a lot less than I expected. My  IV infiltrated. But overall a good experience".

## 2022-10-09 NOTE — Telephone Encounter (Signed)
-----   Message from [name], RN sent at 10/08/2022  6:12 PM EST ----- Regarding: Laurie Mccoy/ Keytruda, Alimta, and Carboplatin/ Dr Julien Nordmann Laurie Mccoy Laurie Mccoy had Laurie Mccoy [place], alimta, and carboplatin today. Tolerated well.

## 2022-10-09 NOTE — Telephone Encounter (Signed)
LM for patient that this nurse was calling to see how they were doing after their treatment. Please call back to Dr. Mohamed's nurse at [phone] if they have any questions or concerns regarding the treatment.  

## 2022-10-10 ENCOUNTER — Ambulatory Visit
Admission: RE | Admit: 2022-10-10 | Discharge: 2022-10-10 | Disposition: A | Discharge status: Home / Self Care | Payer: Medicare Other | Source: Ambulatory Visit | Attending: Radiation Oncology | Admitting: Radiation Oncology

## 2022-10-10 ENCOUNTER — Other Ambulatory Visit (HOSPITAL_COMMUNITY): Payer: Self-pay | Admitting: Student

## 2022-10-10 DIAGNOSIS — C7931 Secondary malignant neoplasm of brain: Secondary | ICD-10-CM

## 2022-10-10 MED ORDER — GADOPICLENOL 0.5 MMOL/ML IV SOLN
9.0000 mL | Freq: Once | INTRAVENOUS | Status: AC | PRN
  Administered 2022-10-10: 9 mL via INTRAVENOUS

## 2022-10-11 ENCOUNTER — Ambulatory Visit (HOSPITAL_COMMUNITY)
Admission: RE | Admit: 2022-10-11 | Discharge: 2022-10-11 | Disposition: A | Discharge status: Home / Self Care | Payer: Medicare Other | Source: Ambulatory Visit | Attending: Physician Assistant | Admitting: Physician Assistant

## 2022-10-11 ENCOUNTER — Telehealth: Payer: Self-pay | Admitting: Internal Medicine

## 2022-10-11 ENCOUNTER — Ambulatory Visit (HOSPITAL_COMMUNITY): Payer: Medicare Other

## 2022-10-11 DIAGNOSIS — R0609 Other forms of dyspnea: Secondary | ICD-10-CM | POA: Insufficient documentation

## 2022-10-11 DIAGNOSIS — M549 Dorsalgia, unspecified: Secondary | ICD-10-CM | POA: Insufficient documentation

## 2022-10-11 DIAGNOSIS — C3490 Malignant neoplasm of unspecified part of unspecified bronchus or lung: Secondary | ICD-10-CM | POA: Insufficient documentation

## 2022-10-11 DIAGNOSIS — C7931 Secondary malignant neoplasm of brain: Secondary | ICD-10-CM | POA: Diagnosis not present

## 2022-10-11 DIAGNOSIS — C7932 Secondary malignant neoplasm of cerebral meninges: Secondary | ICD-10-CM

## 2022-10-11 HISTORY — PX: IR IMAGING GUIDED PORT INSERTION: IMG5740

## 2022-10-11 LAB — GLUCOSE, CAPILLARY: Glucose-Capillary: 90 mg/dL (ref 70–99)

## 2022-10-11 MED ORDER — HEPARIN SOD (PORK) LOCK FLUSH 100 UNIT/ML IV SOLN
INTRAVENOUS | Status: AC | PRN
  Administered 2022-10-11: 500 [IU] via INTRAVENOUS

## 2022-10-11 MED ORDER — SODIUM CHLORIDE 0.9 % IV SOLN: INTRAVENOUS | Status: DC

## 2022-10-11 MED ORDER — MIDAZOLAM HCL 2 MG/2ML IJ SOLN
INTRAMUSCULAR | Status: AC | PRN
  Administered 2022-10-11: .5 mg via INTRAVENOUS
  Administered 2022-10-11: 1 mg via INTRAVENOUS
  Administered 2022-10-11: .5 mg via INTRAVENOUS

## 2022-10-11 MED ORDER — FENTANYL CITRATE (PF) 100 MCG/2ML IJ SOLN
INTRAMUSCULAR | Status: AC
  Filled 2022-10-11: qty 2

## 2022-10-11 MED ORDER — FENTANYL CITRATE (PF) 100 MCG/2ML IJ SOLN
INTRAMUSCULAR | Status: AC | PRN
  Administered 2022-10-11 (×2): 50 ug via INTRAVENOUS

## 2022-10-11 MED ORDER — LIDOCAINE-EPINEPHRINE 1 %-1:100000 IJ SOLN
INTRAMUSCULAR | Status: AC | PRN
  Administered 2022-10-11: 20 mL

## 2022-10-11 MED ORDER — HEPARIN SOD (PORK) LOCK FLUSH 100 UNIT/ML IV SOLN
INTRAVENOUS | Status: AC
  Filled 2022-10-11: qty 5

## 2022-10-11 MED ORDER — MIDAZOLAM HCL 2 MG/2ML IJ SOLN
INTRAMUSCULAR | Status: AC
  Filled 2022-10-11: qty 2

## 2022-10-11 MED ORDER — LIDOCAINE-EPINEPHRINE 1 %-1:100000 IJ SOLN
INTRAMUSCULAR | Status: AC
  Filled 2022-10-11: qty 1

## 2022-10-11 NOTE — H&P (Signed)
Referring Physician(s): Mohamed,M  Supervising Physician: Daryll Brod  Patient Status:  [facility]  Chief Complaint:  "I'm getting a port a cath"  Subjective: Patient familiar to IR service from left supraclavicular lymph node biopsy on 06/13/2022.  She has a history of stage IV adenocarcinoma of the lung and presented with left upper lobe perihilar mass in addition to left hilar mediastinal lymphadenopathy / left supraclavicular lymphadenopathy with brain mets in September of last year.  She has undergone prior craniotomy as well as radiation and currently undergoing chemotherapy.  She presents today for Port-A-Cath placement to assist with treatment.  She currently denies fever, headache, chest pain, worsening cough, abdominal pain, nausea, vomiting or bleeding.  She does have back pain and dyspnea with exertion.   Past Medical History:  Diagnosis Date   Anxiety    "had panic attacks years ago"   Asthma    COPD (chronic obstructive pulmonary disease) ([place])    Goiter 2022   History of hiatal hernia    "dx in college, never bothered me"   History of kidney stones 2012   History of radiation therapy    Left Lung-07/10/22-08/23/22- Dr. Gery Pray   Hypertension    Lung cancer [facility])    Sleep apnea    Past Surgical History:  Procedure Laterality Date   APPENDECTOMY     APPLICATION OF CRANIAL NAVIGATION N/A 07/11/2022   Procedure: APPLICATION OF CRANIAL NAVIGATION;  Surgeon: Karsten Ro, DO;  Location: [place];  Service: Neurosurgery;  Laterality: N/A;   BRONCHIAL BIOPSY  06/12/2021   Procedure: BRONCHIAL BIOPSIES;  Surgeon: Garner Nash, DO;  Location: [place] [facility];  Service: Pulmonary;;   BRONCHIAL BRUSHINGS  06/12/2021   Procedure: BRONCHIAL BRUSHINGS;  Surgeon: Garner Nash, DO;  Location: [place] [facility];  Service: Pulmonary;;   BRONCHIAL WASHINGS  06/12/2021   Procedure: BRONCHIAL WASHINGS;  Surgeon: Garner Nash, DO;  Location: [place] [facility];  Service:  Pulmonary;;   CHOLECYSTECTOMY     COLON RESECTION     CRANIOTOMY N/A 07/11/2022   Procedure: Craniotomy for resection of tumor;  Surgeon: Karsten Ro, DO;  Location: [facility];  Service: Neurosurgery;  Laterality: N/A;  RM 19   FIDUCIAL MARKER PLACEMENT  06/12/2021   Procedure: FIDUCIAL MARKER PLACEMENT;  Surgeon: Garner Nash, DO;  Location: [place] [facility];  Service: Pulmonary;;   hernia     x 2   TONSILLECTOMY     VIDEO BRONCHOSCOPY WITH ENDOBRONCHIAL NAVIGATION Bilateral 06/12/2021   Procedure: VIDEO BRONCHOSCOPY WITH ENDOBRONCHIAL NAVIGATION;  Surgeon: Garner Nash, DO;  Location: [place];  Service: Pulmonary;  Laterality: Bilateral;  ION   VIDEO BRONCHOSCOPY WITH RADIAL ENDOBRONCHIAL ULTRASOUND  06/12/2021   Procedure: RADIAL ENDOBRONCHIAL ULTRASOUND;  Surgeon: Garner Nash, DO;  Location: [place] [facility];  Service: Pulmonary;;      Allergies: Amoxicillin, Compazine [prochlorperazine], Norvasc [amlodipine], and Trelegy ellipta [fluticasone-umeclidin-vilant]  Medications: Prior to Admission medications   Medication Sig Start Date End Date Taking? Authorizing Provider  acetaminophen (TYLENOL) 500 MG tablet Take 500 mg by mouth every 6 (six) hours as needed for mild pain or headache.    [provider]  albuterol (PROVENTIL) (2.5 MG/3ML) 0.083% nebulizer solution Take 3 mLs (2.5 mg total) by nebulization every 4 (four) hours as needed for wheezing or shortness of breath. 04/13/19   Tanda Rockers, MD  albuterol (VENTOLIN HFA) 108 (90 Base) MCG/ACT inhaler Inhale 2 puffs into the lungs every 6 (six) hours as needed for  wheezing or shortness of breath. 10/18/19   Tanda Rockers, MD  ALPRAZolam Duanne Moron) 0.25 MG tablet Take 0.25 mg by mouth at bedtime as needed for anxiety.    [provider]  Budeson-Glycopyrrol-Formoterol (BREZTRI AEROSPHERE) 160-9-4.8 MCG/ACT AERO INHALE 2 PUFFS BY MOUTH EVERY MORNING AND EVERY NIGHT AT BEDTIME Patient taking differently:  Inhale 2 puffs into the lungs in the morning and at bedtime. 05/09/22   Tanda Rockers, MD  folic acid (FOLVITE) 1 MG tablet Take 1 tablet (1 mg total) by mouth daily. 07/22/22   Heilingoetter, Cassandra L, PA-C  guaiFENesin (MUCINEX) 600 MG 12 hr tablet Take 1 tablet (600 mg total) by mouth 2 (two) times daily. 09/09/22   Hosie Poisson, MD  ipratropium-albuterol (DUONEB) 0.5-2.5 (3) MG/3ML SOLN Take 3 mLs by nebulization every 6 (six) hours as needed. 09/09/22   Hosie Poisson, MD  levocetirizine (XYZAL) 5 MG tablet Take 5 mg by mouth at bedtime as needed (for seasonal allergies- if not taking generic Claritin).    [provider]  LORazepam (ATIVAN) 1 MG tablet Take 1 tablet (1 mg total) by mouth as needed. 30 min Prior to MRI or brain radiation procedure Patient taking differently: Take 1 mg by mouth as needed (30 minutes prior to MRI or brain radiation procedure). 07/02/22   Gery Pray, MD  metFORMIN (GLUCOPHAGE-XR) 500 MG 24 hr tablet Take 500 mg by mouth every morning. 01/07/22   [provider]  nitrofurantoin, macrocrystal-monohydrate, (MACROBID) 100 MG capsule Take 1 capsule (100 mg total) by mouth 2 (two) times daily. 09/19/22   Heilingoetter, Cassandra L, PA-C  nystatin (MYCOSTATIN/NYSTOP) powder Apply 1 Application topically 3 (three) times daily. 09/17/22   Heilingoetter, Cassandra L, PA-C  ondansetron (ZOFRAN) 8 MG tablet Take 1 tablet (8 mg total) by mouth every 8 (eight) hours as needed for nausea or vomiting. 07/22/22   Heilingoetter, Cassandra L, PA-C  OVER THE COUNTER MEDICATION Apply 1 application  topically See admin instructions. Sonafine Wound Dressing Topical Emulsion- Apply to the back 1-2 times a day    [provider]  pantoprazole (PROTONIX) 20 MG tablet Take 20 mg by mouth every evening. 07/19/22   [provider]  predniSONE (DELTASONE) 10 MG tablet Prednisone 60 mg daily for 5 more days followed  Prednisone 50 mg daily for 7 days followed  by  Prednisone 40 mg daily for 7 days followed by  Prednisone 30 mg daily for 7 days followed by  Prednisone 20 mg daily for 7 days followed by  Prednisone 10 mg daily for 7 days followed by  Prednisone 5 mg daily for 7 days and stop. 09/09/22   Hosie Poisson, MD  telmisartan (MICARDIS) 20 MG tablet TAKE 1 TABLET AT BEDTIME 09/27/22   O'Neal, Cassie Freer, MD  traMADol (ULTRAM) 50 MG tablet Take 2 tablets (100 mg total) by mouth every 6 (six) hours as needed for severe pain. Patient taking differently: Take 50 mg by mouth every 6 (six) hours as needed for severe pain. 07/13/22   Dawley, Theodoro Doing, DO     Vital Signs:pending    Physical Exam awake, alert.  Chest with distant breath sounds bilaterally, more so on the left.  Heart with regular rate and rhythm.  Abdomen protuberant, soft, positive bowel sounds, nontender.  Trace-1+ right pretibial edema ,no significant left lower extremity edema  Imaging: No results found.  Labs:  CBC: Recent Labs    09/06/22 1101 09/08/22 0357 09/17/22 0925 10/08/22 1301  WBC 4.2  3.6* 4.9 7.6  HGB 10.6* 11.1* 12.1 13.1  HCT 33.3* 34.6* 36.8 39.2  PLT 262 270 193 192    COAGS: Recent Labs    09/02/22 1612 09/03/22 0502  INR 1.1 1.2  APTT 32  --     BMP: Recent Labs    09/06/22 1101 09/08/22 0357 09/17/22 0925 10/08/22 1301  NA 139 141 140 136  K 3.4* 4.2 3.8 4.3  CL 108 106 104 102  CO2 23 29 27 25   GLUCOSE 129* 118* 178* 126*  BUN 16 14 16 14   CALCIUM 8.0* 8.5* 8.9 9.6  CREATININE 0.53 0.51 0.57 0.59  GFRNONAA >60 >60 >60 >60    LIVER FUNCTION TESTS: Recent Labs    09/04/22 0438 09/06/22 1101 09/17/22 0925 10/08/22 1301  BILITOT 0.6 0.6 0.6 0.6  AST 14* 17 10* 10*  ALT 19 23 16 14   ALKPHOS 50 48 55 52  PROT 5.5* 5.3* 5.6* 6.6  ALBUMIN 2.4* 2.5* 3.5 4.1    Assessment and Plan: Patient familiar to IR service from left supraclavicular lymph node biopsy on 06/13/2022.  She has a history of stage IV adenocarcinoma of  the lung and presented with left upper lobe perihilar mass in addition to left hilar mediastinal lymphadenopathy / left supraclavicular lymphadenopathy with brain mets in September of last year.  She has undergone prior craniotomy as well as radiation and currently undergoing chemotherapy.  She presents today for Port-A-Cath placement to assist with treatment. Risks and benefits of image guided port-a-catheter placement was discussed with the patient including, but not limited to bleeding, infection, pneumothorax, or fibrin sheath development and need for additional procedures.  All of the patient's questions were answered, patient is agreeable to proceed. Consent signed and in chart.    Electronically Signed: D. Rowe Robert, PA-C 10/11/2022, 12:02 PM   I spent a total of 25 Minutes at the the patient's bedside AND on the patient's hospital floor or unit, greater than 50% of which was counseling/coordinating care for port a cath placement

## 2022-10-11 NOTE — Sedation Documentation (Signed)
Pt in Ventricular [place]

## 2022-10-11 NOTE — Procedures (Signed)
Interventional Radiology Procedure Note  Procedure: RT IJ POWER PORT    Complications: None  Estimated Blood Loss:  0  Findings: TIP SVCRA    Tamera Punt, MD

## 2022-10-11 NOTE — Telephone Encounter (Signed)
Called patient regarding all upcoming appointments, patient is notified.

## 2022-10-11 NOTE — Sedation Documentation (Signed)
Pt now in [place]

## 2022-10-14 ENCOUNTER — Ambulatory Visit: Payer: Medicare Other | Admitting: Internal Medicine

## 2022-10-14 ENCOUNTER — Ambulatory Visit: Payer: Medicare Other

## 2022-10-14 ENCOUNTER — Telehealth: Payer: Self-pay | Admitting: Radiation Oncology

## 2022-10-14 ENCOUNTER — Inpatient Hospital Stay: Payer: Medicare Other

## 2022-10-14 ENCOUNTER — Other Ambulatory Visit: Payer: Medicare Other

## 2022-10-14 NOTE — Progress Notes (Signed)
Location/Histology of Brain Tumor:  DIAGNOSIS: Stage IV (T2 a, N3, M1b) non-small cell lung cancer, adenocarcinoma presented with left upper lobe perihilar mass in addition to left hilar and mediastinal lymphadenopathy as well as left supraclavicular lymphadenopathy with brain metastasis diagnosed in September 2023.    Prior to initiating radiation to the left lung, the patient underwent pre-op SRS with Dr. Isidore Moos followed by resection of the right occipital mass on 07/11/22 under the care of Dr. Reatha Armour. 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     .  Patient presented with symptoms of:   MRI on 10-12-22 IMPRESSION: 1. Approximately 7 mm enhancing lesion in the lateral, posterior and inferior right temporal lobe (series 13, image 51) appears more posterior than the previously noted right temporal lobe lesion and therefore is suspicious for a new metastatic lesion. No evidence of additional new lesions on the motion limited postcontrast imaging. 2. The previously noted lesion in the left occipital lobe and in the more anterior right temporal lobe are no longer seen. 3. Prior right occipital metastasis resection without evidence of residual tumor.  Past or anticipated interventions, if any, per neurosurgery:   Dr. Reatha Armour 07-10-22 STEREOTACTIC RADIOSURGERY OPERATIVE NOTE   PRE-OPERATIVE DIAGNOSIS: Metastatic adenocarcinoma of the lung, to the brain, 3 lesions   POST-OPERATIVE DIAGNOSIS:  Same   PROCEDURE:  Stereotactic Radiosurgery, 3 lesions   SURGEON:  Elwin Sleight, DO   RADIATION ONCOLOGIST: Dr. Isidore Moos   Past or anticipated interventions, if any, per medical oncology:  Dr. Julien Nordmann 10-08-22 ASSESSMENT AND PLAN: This is a very pleasant 82 years old white female with likely stage IV (T2a, N3, M1B) non-small cell lung cancer, adenocarcinoma presented with left upper lobe perihilar mass in addition to left  hilar and mediastinal lymphadenopathy as well as left supraclavicular lymphadenopathy and suspicious solitary brain metastasis diagnosed in September 2023. The molecular studies showed positive KRAS G12C mutation The patient underwent SRS followed by craniotomy on July 10, 2022 to the solitary brain metastasis.  She also underwent palliative radiotherapy to the left upper lobe perihilar mass and mediastinal lymphadenopathy under the care of Dr. Sondra Come. The patient is currently on systemic chemotherapy with carboplatin for AUC of 5, Alimta 500 Mg/M2 and Keytruda 200 Mg IV every 3 weeks.  First dose October 08, 2022.  She is feeling much better today and I recommended for her to proceed with the treatment as planned. For the steroid taper, she is currently on prednisone 20 mg p.o. daily.  Starting from tomorrow she will reduce her dose to 10 mg p.o. daily. I will see the patient back for follow-up visit in 3 weeks for evaluation before restarting cycle #2. She was advised to call immediately if she has any other concerning symptoms in the interval. The patient voices understanding of current disease status and treatment options and is in agreement with the current care plan.   All questions were answered. The patient knows to call the clinic with any problems, questions or concerns. We can certainly see the patient much sooner if necessary.    Dose of Decadron, if applicable: no  Recent neurologic symptoms, if any:  Seizures: {:18581} Headaches: {:18581} Nausea: {:18581} Dizziness/ataxia: {:18581} Difficulty with hand coordination: {:18581} Focal numbness/weakness: {:18581} Visual deficits/changes: {:18581} Confusion/Memory deficits: {:18581}  Painful bone metastases at present, if any: {:18581}  SAFETY ISSUES: Prior radiation?  Yes, SRS previously Pacemaker/ICD? no Possible current pregnancy? no Is the patient on methotrexate? no  Additional  Complaints / other details: ***

## 2022-10-14 NOTE — Telephone Encounter (Signed)
1/8 @ 10:25 am spoke to patient.  She would like to look at her schedule and call back to confirm date/time for follow-up consult with CT Sim.

## 2022-10-15 ENCOUNTER — Telehealth: Payer: Self-pay

## 2022-10-15 ENCOUNTER — Other Ambulatory Visit: Payer: Self-pay

## 2022-10-15 ENCOUNTER — Inpatient Hospital Stay: Payer: Medicare Other

## 2022-10-15 DIAGNOSIS — C3490 Malignant neoplasm of unspecified part of unspecified bronchus or lung: Secondary | ICD-10-CM

## 2022-10-15 DIAGNOSIS — Z5112 Encounter for antineoplastic immunotherapy: Secondary | ICD-10-CM | POA: Diagnosis not present

## 2022-10-15 LAB — CMP (CANCER CENTER ONLY)
ALT: 24 U/L (ref 0–44)
AST: 17 U/L (ref 15–41)
Albumin: 3.8 g/dL (ref 3.5–5.0)
Alkaline Phosphatase: 54 U/L (ref 38–126)
Anion gap: 5 (ref 5–15)
BUN: 14 mg/dL (ref 8–23)
CO2: 28 mmol/L (ref 22–32)
Calcium: 9 mg/dL (ref 8.9–10.3)
Chloride: 103 mmol/L (ref 98–111)
Creatinine: 0.52 mg/dL (ref 0.44–1.00)
GFR, Estimated: >60 mL/min (ref >60)
Glucose, Bld: 118 mg/dL — ABNORMAL HIGH (ref 70–99)
Potassium: 4.3 mmol/L (ref 3.5–5.1)
Sodium: 136 mmol/L (ref 135–145)
Total Bilirubin: 0.5 mg/dL (ref 0.3–1.2)
Total Protein: 6.1 g/dL — ABNORMAL LOW (ref 6.5–8.1)

## 2022-10-15 LAB — CBC WITH DIFFERENTIAL (CANCER CENTER ONLY)
Abs Immature Granulocytes: 0.01 10*3/uL (ref 0.00–0.07)
Basophils Absolute: 0 10*3/uL (ref 0.0–0.1)
Basophils Relative: 1 %
Eosinophils Absolute: 0 10*3/uL (ref 0.0–0.5)
Eosinophils Relative: 2 %
HCT: 34.1 % — ABNORMAL LOW (ref 36.0–46.0)
Hemoglobin: 11.5 g/dL — ABNORMAL LOW (ref 12.0–15.0)
Immature Granulocytes: 1 %
Lymphocytes Relative: 19 %
Lymphs Abs: 0.3 10*3/uL — ABNORMAL LOW (ref 0.7–4.0)
MCH: 31.9 pg (ref 26.0–34.0)
MCHC: 33.7 g/dL (ref 30.0–36.0)
MCV: 94.7 fL (ref 80.0–100.0)
Monocytes Absolute: 0.2 10*3/uL (ref 0.1–1.0)
Monocytes Relative: 9 %
Neutro Abs: 1.3 10*3/uL — ABNORMAL LOW (ref 1.7–7.7)
Neutrophils Relative %: 68 %
Platelet Count: 136 10*3/uL — ABNORMAL LOW (ref 150–400)
RBC: 3.6 MIL/uL — ABNORMAL LOW (ref 3.87–5.11)
RDW: 13.7 % (ref 11.5–15.5)
WBC Count: 1.8 10*3/uL — ABNORMAL LOW (ref 4.0–10.5)
nRBC: 0 % (ref 0.0–0.2)

## 2022-10-15 MED ORDER — HEPARIN SOD (PORK) LOCK FLUSH 100 UNIT/ML IV SOLN
500.0000 [IU] | Freq: Once | INTRAVENOUS | Status: AC
  Administered 2022-10-15: 500 [IU] via INTRAVENOUS

## 2022-10-15 MED ORDER — SODIUM CHLORIDE 0.9% FLUSH
10.0000 mL | INTRAVENOUS | Status: DC | PRN
  Administered 2022-10-15: 10 mL via INTRAVENOUS

## 2022-10-15 NOTE — Telephone Encounter (Signed)
Rn has attempted to call pt back twice and has left voice mails for a return call. Pt called concerning schedule for tomorrow and wanted details on what the appointments were for. Rn will speak to pt when she calls back.

## 2022-10-15 NOTE — Progress Notes (Signed)
Radiation Oncology         [phone] ________________________________  Outpatient Re-Consultation  Name: Laurie Mccoy MRN: [id number]  Date: 10/16/2022  DOB: [birth year]  CC:[name], NP  Si Gaul, MD   REFERRING PHYSICIAN: Si Gaul, MD  DIAGNOSIS:    ICD-10-CM   1. Metastasis to brain [facility])  C79.31      Stage IVA (T2 a, N3, M1b) non-small cell lung cancer, adenocarcinoma presented with left upper lobe perihilar mass, left hilar and mediastinal lymphadenopathy, left supraclavicular lymphadenopathy, and evidence of metastatic disease to the brain     New intracranial lesion (MRI 10/10/22)   Cancer Staging  Malignant neoplasm of unspecified part of unspecified bronchus or lung (HCC) Staging form: Lung, AJCC 8th Edition - Clinical: Stage IVA Ileana Ladd, cN3, cM1b) - Signed by Si Gaul, MD on 10/08/2022     Intent: Palliative  Radiation Treatment Dates: 07/10/2022 through 08/23/2022 Site Technique Total Dose (Gy) Dose per Fx (Gy) Completed Fx Beam Energies  Lung, Left: Lung_L 3D 60/60 2 30/30 6X, 10X  Brain: Brain_SRS* IMRT 16/16 16 1/1 6XFFF   ( *Note: On 07/10/2022 she received Stereotactic brain radiosurgery to 3 lesions to doses ranging from 16-20 Wallace Cullens)  CHIEF COMPLAINT: Here to discuss management of new brain metastasis   HISTORY OF PRESENT ILLNESS::Laurie Mccoy is a 82 y.o. female who presents today to discuss brain SRS in management of a new site of brain metastases from left lung cancer primary. The patient last met with Dr. Roselind Messier on 09/23/22 for follow-up of radiation to the left lung and pre-op radiosurgery to the 3 intracranial metastases.    To review, the patient underwent pre-op SRS under the care of myself, followed by resection of the right occipital mass on 07/11/22 under the care of Dr. Jake Samples. 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 Post-op  MRI of the brain on 07/13/22 showed no evidence of residual tumor.   In the interval since I last met with the patient for follow-up, she presented to receive her first cycle of chemotherapy on 09/02/22. However shortly before starting treatment and receiving fluids, she began to endorse increased fatigue and SOB. Vitals were checked and she was found to be febrile at 100.7, prompting treatment to be held.    Given her symptoms, the patient sent to the ED that same date for further evaluation. Chest x-ray performed in the ED showed possible concern for pneumonia prompting admission (started on Vancomycin and Cefepime on admission).  She was also found to be hypoxic and required 2L supplemental O2 to keep her sats greater than 90%. Hospital course included 5 days of IV abx and a short course of steroids for acute bronchitis. Given persistence of dyspnea and continued supplemental O2 requirements, a CT of the chest was performed on 09/06/22 which demonstrated nonspecific development of patchy ground-glass in the LUL and RUL (LUL greater than RUL) thought to be related to recent radiotherapy or post treatment related pneumonitis. CT also showed improvement of the metastatic lesion. 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 Arbutus Ped as scheduled. (She was discharged home on 09/09/22).    Dr. Arbutus Ped recommended waiting until the patient completed her prednisone taper to start systemic treatment. The patient was able to receive her first infusion on 10/08/22.  Pertinent imaging performed in the interval includes a follow-up MRI of the brain on 10/10/22 which demonstrates an approximately 7 mm enhancing lesion in the lateral, posterior and inferior right temporal lobe. The lesion appears to be more posterior than the previously noted right  temporal lobe lesion and is therefore suspicious for a new metastatic lesion. MRI otherwise shows no other new intracranial lesions, complete absence of the previously noted lesions in the left occipital lobe and in the more anterior right temporal lobe, and no evidence of residual tumor at the site of the previously resected right occipital metastasis.   Patient is present with her supportive husband today. She endorses shortness of breath on exertion and chest pain, particularly on the right side. She states her vision is blurry at times, but denies any other visual changes.  Her biggest concern is anxiety regarding claustrophobia while wearing the stereotactic radiosurgery brain mask.  PAST MEDICAL HISTORY:  has a past medical history of Anxiety, Asthma, COPD (chronic obstructive pulmonary disease) (HCC), Goiter (2022), History of hiatal hernia, History of kidney stones (2012), History of radiation therapy, Hypertension, Lung cancer (HCC), and Sleep apnea.    PAST SURGICAL HISTORY: Past Surgical History:  Procedure Laterality Date   APPENDECTOMY     APPLICATION OF CRANIAL NAVIGATION N/A 07/11/2022   Procedure: APPLICATION OF CRANIAL NAVIGATION;  Surgeon: Dawley, Alan Mulder, DO;  Location: [facility] OR;  Service: Neurosurgery;  Laterality: N/A;   BRONCHIAL BIOPSY  06/12/2021   Procedure: BRONCHIAL BIOPSIES;  Surgeon: Josephine Igo, DO;  Location: [facility] ENDOSCOPY;  Service: Pulmonary;;   BRONCHIAL BRUSHINGS  06/12/2021   Procedure: BRONCHIAL BRUSHINGS;  Surgeon: Josephine Igo, DO;  Location: [facility];  Service: Pulmonary;;   BRONCHIAL WASHINGS  06/12/2021   Procedure: BRONCHIAL WASHINGS;  Surgeon: Josephine Igo, DO;  Location: [facility];  Service: Pulmonary;;   CHOLECYSTECTOMY     COLON RESECTION     CRANIOTOMY N/A 07/11/2022   Procedure: Craniotomy for resection of tumor;  Surgeon: Bethann Goo, DO;  Location: [facility] OR;  Service: Neurosurgery;  Laterality: N/A;  RM 19   FIDUCIAL MARKER  PLACEMENT  06/12/2021   Procedure: FIDUCIAL MARKER PLACEMENT;  Surgeon: Josephine Igo, DO;  Location: [facility];  Service: Pulmonary;;   hernia     x 2   IR IMAGING GUIDED PORT INSERTION  10/11/2022   TONSILLECTOMY     VIDEO BRONCHOSCOPY WITH ENDOBRONCHIAL NAVIGATION Bilateral 06/12/2021   Procedure: VIDEO BRONCHOSCOPY WITH ENDOBRONCHIAL NAVIGATION;  Surgeon: Josephine Igo, DO;  Location: [facility];  Service: Pulmonary;  Laterality: Bilateral;  ION   VIDEO BRONCHOSCOPY WITH RADIAL ENDOBRONCHIAL ULTRASOUND  06/12/2021   Procedure: RADIAL ENDOBRONCHIAL ULTRASOUND;  Surgeon: Josephine Igo, DO;  Location: [facility];  Service: Pulmonary;;    FAMILY HISTORY: family history includes Brain cancer in her mother; Lung cancer in her father and mother; Stroke in her brother.  SOCIAL HISTORY:  reports that she quit smoking about 4 months ago. Her smoking use included cigarettes. She has a 23.00 pack-year smoking history. She has been exposed to tobacco smoke. She has never used smokeless tobacco. She reports that she does not currently use alcohol. She reports that she does not use drugs.  ALLERGIES: Amoxicillin, Compazine [prochlorperazine], Norvasc [amlodipine], and Trelegy ellipta [fluticasone-umeclidin-vilant]  MEDICATIONS:  Current Outpatient Medications  Medication Sig Dispense Refill   acetaminophen (TYLENOL) 500 MG tablet Take 500 mg by mouth every 6 (six) hours as needed for mild pain or headache.  albuterol (PROVENTIL) (2.5 MG/3ML) 0.083% nebulizer solution Take 3 mLs (2.5 mg total) by nebulization every 4 (four) hours as needed for wheezing or shortness of breath.     albuterol (VENTOLIN HFA) 108 (90 Base) MCG/ACT inhaler Inhale 2 puffs into the lungs every 6 (six) hours as needed for wheezing or shortness of breath. 18 g 1   ALPRAZolam (XANAX) 0.25 MG tablet Take 0.25 mg by mouth at bedtime as needed for anxiety.     Budeson-Glycopyrrol-Formoterol (BREZTRI AEROSPHERE)  160-9-4.8 MCG/ACT AERO INHALE 2 PUFFS BY MOUTH EVERY MORNING AND EVERY NIGHT AT BEDTIME (Patient taking differently: Inhale 2 puffs into the lungs in the morning and at bedtime.) 10.7 g 5   folic acid (FOLVITE) 1 MG tablet Take 1 tablet (1 mg total) by mouth daily. 30 tablet 2   guaiFENesin (MUCINEX) 600 MG 12 hr tablet Take 1 tablet (600 mg total) by mouth 2 (two) times daily.     ipratropium-albuterol (DUONEB) 0.5-2.5 (3) MG/3ML SOLN Take 3 mLs by nebulization every 6 (six) hours as needed. 360 mL 5   levocetirizine (XYZAL) 5 MG tablet Take 5 mg by mouth at bedtime as needed (for seasonal allergies- if not taking generic Claritin).     LORazepam (ATIVAN) 1 MG tablet Take 1 tablet (1 mg total) by mouth as needed. 30 min Prior to MRI or brain radiation procedure (Patient taking differently: Take 1 mg by mouth as needed (30 minutes prior to MRI or brain radiation procedure).) 5 tablet 0   metFORMIN (GLUCOPHAGE-XR) 500 MG 24 hr tablet Take 500 mg by mouth every morning.     pantoprazole (PROTONIX) 20 MG tablet Take 20 mg by mouth every evening.     telmisartan (MICARDIS) 20 MG tablet TAKE 1 TABLET AT BEDTIME 30 tablet 1   traMADol (ULTRAM) 50 MG tablet Take 2 tablets (100 mg total) by mouth every 6 (six) hours as needed for severe pain. (Patient taking differently: Take 50 mg by mouth every 6 (six) hours as needed for severe pain.) 30 tablet 0   nitrofurantoin, macrocrystal-monohydrate, (MACROBID) 100 MG capsule Take 1 capsule (100 mg total) by mouth 2 (two) times daily. (Patient not taking: Reported on 10/16/2022) 10 capsule 0   nystatin (MYCOSTATIN/NYSTOP) powder Apply 1 Application topically 3 (three) times daily. (Patient not taking: Reported on 10/16/2022) 30 g 0   ondansetron (ZOFRAN) 8 MG tablet Take 1 tablet (8 mg total) by mouth every 8 (eight) hours as needed for nausea or vomiting. (Patient not taking: Reported on 10/16/2022) 30 tablet 2   OVER THE COUNTER MEDICATION Apply 1 application  topically  See admin instructions. Sonafine Wound Dressing Topical Emulsion- Apply to the back 1-2 times a day (Patient not taking: Reported on 10/16/2022)     predniSONE (DELTASONE) 10 MG tablet Prednisone 60 mg daily for 5 more days followed  Prednisone 50 mg daily for 7 days followed by  Prednisone 40 mg daily for 7 days followed by  Prednisone 30 mg daily for 7 days followed by  Prednisone 20 mg daily for 7 days followed by  Prednisone 10 mg daily for 7 days followed by  Prednisone 5 mg daily for 7 days and stop. (Patient not taking: Reported on 10/16/2022) 140 tablet 0   No current facility-administered medications for this encounter.   Facility-Administered Medications Ordered in Other Encounters  Medication Dose Route Frequency Provider Last Rate Last Admin   heparin lock flush 100 unit/mL  500 Units Intravenous Once Lonie Peak, MD  sodium chloride flush (NS) 0.9 % injection 3 mL  3 mL Intravenous Once Lonie Peak, MD        REVIEW OF SYSTEMS:  Notable for that above.   PHYSICAL EXAM:  height is 5\' 6"  (1.676 m) and weight is 207 lb 6.4 oz (94.1 kg). Her temperature is 97.7 F (36.5 C). Her blood pressure is 150/57 (abnormal) and her pulse is 99. Her respiration is 24 (abnormal) and oxygen saturation is 97%.   General: Alert and oriented, in mild distress. Tearful on exam.  HEENT: Head is normocephalic. Extraocular movements are intact. Oropharynx is clear. Thinning of the hair in the posterior scalp.  Neck: Neck is supple, no palpable cervical or supraclavicular lymphadenopathy. Heart: Regular in rate and rhythm with no murmurs, rubs, or gallops. Chest: Clear to auscultation bilaterally, with no rhonchi, wheezes, or rales. Abdomen: Soft, nontender, nondistended, with no rigidity or guarding. Extremities: No cyanosis or edema. Lymphatics: see Neck Exam Skin: No concerning lesions. Musculoskeletal: symmetric strength and muscle tone throughout. Neurologic: Cranial nerves II through  XII are grossly intact. No obvious focalities. Speech is fluent. Coordination is intact.   Psychiatric: Judgment and insight are intact. Affect is appropriate.  KPS = 70  100 - Normal; no complaints; no evidence of disease. 90   - Able to carry on normal activity; minor signs or symptoms of disease. 80   - Normal activity with effort; some signs or symptoms of disease. 79   - Cares for self; unable to carry on normal activity or to do active work. 60   - Requires occasional assistance, but is able to care for most of his personal needs. 50   - Requires considerable assistance and frequent medical care. 40   - Disabled; requires special care and assistance. 30   - Severely disabled; hospital admission is indicated although death not imminent. 20   - Very sick; hospital admission necessary; active supportive treatment necessary. 10   - Moribund; fatal processes progressing rapidly. 0     - Dead  Karnofsky DA, Abelmann WH, Craver LS and [facility] [id number]) The use of the nitrogen mustards in the palliative treatment of carcinoma: with particular reference to bronchogenic carcinoma Cancer 1 634-56  ECOG = 2  0 - Asymptomatic (Fully active, able to carry on all predisease activities without restriction)  1 - Symptomatic but completely ambulatory (Restricted in physically strenuous activity but ambulatory and able to carry out work of a light or sedentary nature. For example, light housework, office work)  2 - Symptomatic, <50% in bed during the day (Ambulatory and capable of all self care but unable to carry out any work activities. Up and about more than 50% of waking hours)  3 - Symptomatic, >50% in bed, but not bedbound (Capable of only limited self-care, confined to bed or chair 50% or more of waking hours)  4 - Bedbound (Completely disabled. Cannot carry on any self-care. Totally confined to bed or chair)  5 - Death   (3736 MM, Creech RH, Tormey DC, et al. [id number]). "Toxicity and  response criteria of the [facility]". Am. [facility]. Oncol. 5 (6): 649-55  KPS = 90  100 - Normal; no complaints; no evidence of disease. 90   - Able to carry on normal activity; minor signs or symptoms of disease. 80   - Normal activity with effort; some signs or symptoms of disease. 104   - Cares for self; unable to carry on normal activity or  to do active work. 60   - Requires occasional assistance, but is able to care for most of his personal needs. 50   - Requires considerable assistance and frequent medical care. 40   - Disabled; requires special care and assistance. 30   - Severely disabled; hospital admission is indicated although death not imminent. 20   - Very sick; hospital admission necessary; active supportive treatment necessary. 10   - Moribund; fatal processes progressing rapidly. 0     - Dead  Karnofsky DA, Abelmann WH, Craver LS and [place] [id number]) The use of the nitrogen mustards in the palliative treatment of carcinoma: with particular reference to bronchogenic carcinoma Cancer 1 634-56    LABORATORY DATA:  Lab Results  Component Value Date   WBC 1.8 (L) 10/15/2022   HGB 11.5 (L) 10/15/2022   HCT 34.1 (L) 10/15/2022   MCV 94.7 10/15/2022   PLT 136 (L) 10/15/2022   CMP     Component Value Date/Time   NA 136 10/15/2022 1326   NA 139 09/17/2019 1550   K 4.3 10/15/2022 1326   CL 103 10/15/2022 1326   CO2 28 10/15/2022 1326   GLUCOSE 118 (H) 10/15/2022 1326   BUN 14 10/15/2022 1326   BUN 9 09/17/2019 1550   CREATININE 0.52 10/15/2022 1326   CALCIUM 9.0 10/15/2022 1326   PROT 6.1 (L) 10/15/2022 1326   ALBUMIN 3.8 10/15/2022 1326   AST 17 10/15/2022 1326   ALT 24 10/15/2022 1326   ALKPHOS 54 10/15/2022 1326   BILITOT 0.5 10/15/2022 1326   GFRNONAA >60 10/15/2022 1326   GFRAA 100 09/17/2019 1550         RADIOGRAPHY: MR Brain W Wo Contrast  Result Date: 10/12/2022 CLINICAL DATA:  Brain metastases, assess treatment response  3TSRS Protocol EXAM: MRI HEAD WITHOUT AND WITH CONTRAST TECHNIQUE: Multiplanar, multiecho pulse sequences of the brain and surrounding structures were obtained without and with intravenous contrast. CONTRAST:  9 mL Vueway IV. COMPARISON:  MRI head July 13, 2022. FINDINGS: Motion limited postcontrast imaging. Brain: Approximately 7 mm enhancing lesion in the lateral, posterior and inferior right temporal lobe (series 13, image 51) appears more posterior than the previously noted right temporal lobe lesion and therefore is suspicious for a new metastatic lesion. The previously noted lesion in the left occipital lobe and in the more anterior right temporal lobe are no longer seen. No definite additional new lesions identified; however, motion does limit assessment. At the site of prior right occipital craniotomy and resection of the right occipital metastasis there is no masslike enhancement to suggest residual malignancy. Mild linear enhancement along the periphery of the resection cavity is probably postoperative. No evidence of acute infarct, acute hemorrhage, midline shift or hydrocephalus. Vascular: Major arterial flow voids are maintained at the skull base. Skull and upper cervical spine: Normal marrow signal. Sinuses/Orbits: Clear sinuses.  No acute orbital findings. Other: No mastoid effusions. IMPRESSION: 1. Approximately 7 mm enhancing lesion in the lateral, posterior and inferior right temporal lobe (series 13, image 51) appears more posterior than the previously noted right temporal lobe lesion and therefore is suspicious for a new metastatic lesion. No evidence of additional new lesions on the motion limited postcontrast imaging. 2. The previously noted lesion in the left occipital lobe and in the more anterior right temporal lobe are no longer seen. 3. Prior right occipital metastasis resection without evidence of residual tumor. Electronically Signed   By: Feliberto Harts M.D.   On: 10/12/2022 17:12  IR IMAGING GUIDED PORT INSERTION  Result Date: 10/11/2022 CLINICAL DATA:  METASTATIC LUNG CANCER, ACCESS FOR CHEMOTHERAPY EXAM: RIGHT INTERNAL JUGULAR SINGLE LUMEN POWER PORT CATHETER INSERTION Date:  10/11/2022 10/11/2022 2:38 pm Radiologist:  M. Ruel Favors, MD Guidance:  Ultrasound and fluoroscopic MEDICATIONS: 1% lidocaine local with epinephrine ANESTHESIA/SEDATION: Versed 2.0 mg IV; Fentanyl 100 mcg IV; Moderate Sedation Time:  29 minutes The patient was continuously monitored during the procedure by the interventional radiology nurse under my direct supervision. FLUOROSCOPY: 0 minutes, 42 seconds (2.0 mGy) COMPLICATIONS: None immediate. CONTRAST:  None. PROCEDURE: Informed consent was obtained from the patient following explanation of the procedure, risks, benefits and alternatives. The patient understands, agrees and consents for the procedure. All questions were addressed. A time out was performed. Maximal barrier sterile technique utilized including caps, mask, sterile gowns, sterile gloves, large sterile drape, hand hygiene, and 2% chlorhexidine scrub. Under sterile conditions and local anesthesia, right internal jugular micropuncture venous access was performed. Access was performed with ultrasound. Images were obtained for documentation of the patent right internal jugular vein. A guide wire was inserted followed by a transitional dilator. This allowed insertion of a guide wire and catheter into the IVC. Measurements were obtained from the SVC / RA junction back to the right IJ venotomy site. In the right infraclavicular chest, a subcutaneous pocket was created over the second anterior rib. This was done under sterile conditions and local anesthesia. 1% lidocaine with epinephrine was utilized for this. A 2.5 cm incision was made in the skin. Blunt dissection was performed to create a subcutaneous pocket over the right pectoralis major muscle. The pocket was flushed with saline vigorously. There was  adequate hemostasis. The port catheter was assembled and checked for leakage. The port catheter was secured in the pocket with two retention sutures. The tubing was tunneled subcutaneously to the right venotomy site and inserted into the SVC/RA junction through a valved peel-away sheath. Position was confirmed with fluoroscopy. Images were obtained for documentation. The patient tolerated the procedure well. No immediate complications. Incisions were closed in a two layer fashion with 4 - 0 Vicryl suture. Dermabond was applied to the skin. The port catheter was accessed, blood was aspirated followed by saline and heparin flushes. Needle was removed. A dry sterile dressing was applied. IMPRESSION: Ultrasound and fluoroscopically guided right internal jugular single lumen power port catheter insertion. Tip in the SVC/RA junction. Catheter ready for use. Electronically Signed   By: Judie Petit.  Shick M.D.   On: 10/11/2022 14:42      IMPRESSION/PLAN: This is a very pleasant 82 year old with stage IV lung adenocarcinoma with metastatic disease to the brain.  There is a new brain lesion that we have confirmed is in an untreated part of the brain.  She was discussed at our CNS tumor board and I have reviewed her MRI and previous plan with physics.  I recommendation is that she undergo stereotactic radiosurgery to this new small right temporal brain lesion  I had a lengthy discussion with the patient and her husband after reviewing their MRI results with them.  We again spoke about radionecrosis that can result from stereotactic radiosurgery.  We reviewed the risk of headache, dizziness, injury to the brain, fatigue, neurologic symptoms that can result from brain radiosurgery. Consent signed today.  After lengthy discussion, the patient would like to proceed with stereotactic brain radiosurgery to their new single focus of brain metastatic disease per my recommendations.   CT simulation will take place today  and treatment  in the near future.  I plan to deliver 20 Gy in 1 fraction to the brain lesion. Lorazepam prescribed for anxiety during simulation today. Patient has previously been prescribed lorazepam she can use for her [facility] treatment.  She has extreme claustrophobia and therefore may take up to 2 mg before  each procedure if needed.  On date of service, in total, I spent 60 minutes on this encounter. Patient was seen in person.  Note signed after date of encounter - minutes pertain to encounter date only. __________________________________________   Joyice Faster, PA   Lonie Peak, MD  This document serves as a record of services personally performed by Lonie Peak, MD. It was created on her behalf by Neena Rhymes, a trained medical scribe. The creation of this record is based on the scribe's personal observations and the provider's statements to them. This document has been checked and approved by the attending provider.

## 2022-10-16 ENCOUNTER — Ambulatory Visit
Admission: RE | Admit: 2022-10-16 | Discharge: 2022-10-16 | Disposition: A | Discharge status: Home / Self Care | Payer: Medicare Other | Source: Ambulatory Visit | Attending: Radiation Oncology | Admitting: Radiation Oncology

## 2022-10-16 ENCOUNTER — Encounter: Payer: Self-pay | Admitting: Radiation Oncology

## 2022-10-16 VITALS — BP 150/57 | HR 99 | Temp 97.7°F | Resp 24 | Ht 66.0 in | Wt 207.4 lb

## 2022-10-16 VITALS — BP 145/67 | HR 99 | Resp 20

## 2022-10-16 DIAGNOSIS — C7931 Secondary malignant neoplasm of brain: Secondary | ICD-10-CM | POA: Diagnosis present

## 2022-10-16 DIAGNOSIS — F419 Anxiety disorder, unspecified: Secondary | ICD-10-CM | POA: Diagnosis present

## 2022-10-16 DIAGNOSIS — C3490 Malignant neoplasm of unspecified part of unspecified bronchus or lung: Secondary | ICD-10-CM | POA: Diagnosis not present

## 2022-10-16 DIAGNOSIS — C7932 Secondary malignant neoplasm of cerebral meninges: Secondary | ICD-10-CM | POA: Insufficient documentation

## 2022-10-16 MED ORDER — HEPARIN SOD (PORK) LOCK FLUSH 100 UNIT/ML IV SOLN
500.0000 [IU] | Freq: Once | INTRAVENOUS | Status: AC
  Administered 2022-10-16: 500 [IU] via INTRAVENOUS

## 2022-10-16 MED ORDER — SODIUM CHLORIDE 0.9% FLUSH
3.0000 mL | Freq: Once | INTRAVENOUS | Status: AC
  Administered 2022-10-16: 3 mL via INTRAVENOUS

## 2022-10-16 MED ORDER — LORAZEPAM 1 MG PO TABS
2.0000 mg | ORAL_TABLET | Freq: Once | ORAL | Status: AC
  Administered 2022-10-16: 2 mg via ORAL
  Filled 2022-10-16: qty 2

## 2022-10-16 NOTE — Progress Notes (Addendum)
Has armband been applied?  Yes.    Does patient have an allergy to IV contrast dye?: No.   Has patient ever received premedication for IV contrast dye?: No.   Does patient take metformin?: yes  If patient does take metformin when was the last dose: 10-14-22 was last dose, Rn instructed pt to not dose today or tomorrow due to iv contrast being given  Date of lab work: 10-15-22 BUN: 14 CR: 0.59 eGfr: greater than 60  IV site: pt has right chest port, port site is bruised and tender overall  Has IV site been added to flowsheet?  Yes.    Vitals:   10/16/22 1323  BP: (!) 145/67  Pulse: 99  Resp: 20  SpO2: 97%    Pt premedicated with oral ativan prior to [facility] CT simulation per md order. Pt stable though continues to be nervous about today's events overall. Family at bedside.   Right side chest port access attempt unsuccessful by Geoffry Paradise RN. Along with two unsuccessful IV attempts. Pt continues to be highly nervous and tearful.

## 2022-10-17 ENCOUNTER — Telehealth: Payer: Self-pay

## 2022-10-17 NOTE — Telephone Encounter (Signed)
Rn called Laurie Mccoy after receiving message from Dr. Julien Nordmann about pt labs. She was reassured that were no immediate worries about these labs.

## 2022-10-17 NOTE — Telephone Encounter (Signed)
Pt called concerning her recent labs that were ordered by Dr. Julien Nordmann. She states she felt worried they were so low (WBCs and RBC's) and did not know what the labs meant overall. Rn went over labs with pt and pt stated she had also received recent chemo. Rn educated pt on labs and how they can be effected with Chemo. Rn did also educate pt on increased bruising due to platelets and also to protect herself from germs more while WBC's are low. Rn will reach out to Abelina Bachelor to have Dr. Julien Nordmann to investigate to see if additional information or treatment is needed for these labs. Pt understood the conversation and felt more assured after telephone call with RN.

## 2022-10-18 ENCOUNTER — Telehealth: Payer: Self-pay | Admitting: Medical Oncology

## 2022-10-18 ENCOUNTER — Encounter: Payer: Self-pay | Admitting: Radiation Oncology

## 2022-10-18 NOTE — Telephone Encounter (Signed)
Reviewed labs with pt and neutropenic precautions.LVM to return my call if she has questions.

## 2022-10-21 ENCOUNTER — Other Ambulatory Visit: Payer: Medicare Other

## 2022-10-22 ENCOUNTER — Other Ambulatory Visit: Payer: Self-pay

## 2022-10-22 ENCOUNTER — Inpatient Hospital Stay: Payer: Medicare Other

## 2022-10-22 DIAGNOSIS — C3490 Malignant neoplasm of unspecified part of unspecified bronchus or lung: Secondary | ICD-10-CM | POA: Diagnosis not present

## 2022-10-22 DIAGNOSIS — Z95828 Presence of other vascular implants and grafts: Secondary | ICD-10-CM

## 2022-10-22 DIAGNOSIS — Z5112 Encounter for antineoplastic immunotherapy: Secondary | ICD-10-CM | POA: Diagnosis not present

## 2022-10-22 LAB — CBC WITH DIFFERENTIAL (CANCER CENTER ONLY)
Abs Immature Granulocytes: 0.01 10*3/uL (ref 0.00–0.07)
Basophils Absolute: 0 10*3/uL (ref 0.0–0.1)
Basophils Relative: 1 %
Eosinophils Absolute: 0 10*3/uL (ref 0.0–0.5)
Eosinophils Relative: 2 %
HCT: 32.1 % — ABNORMAL LOW (ref 36.0–46.0)
Hemoglobin: 11.1 g/dL — ABNORMAL LOW (ref 12.0–15.0)
Immature Granulocytes: 0 %
Lymphocytes Relative: 27 %
Lymphs Abs: 0.7 10*3/uL (ref 0.7–4.0)
MCH: 33.1 pg (ref 26.0–34.0)
MCHC: 34.6 g/dL (ref 30.0–36.0)
MCV: 95.8 fL (ref 80.0–100.0)
Monocytes Absolute: 0.3 10*3/uL (ref 0.1–1.0)
Monocytes Relative: 12 %
Neutro Abs: 1.5 10*3/uL — ABNORMAL LOW (ref 1.7–7.7)
Neutrophils Relative %: 58 %
Platelet Count: 179 10*3/uL (ref 150–400)
RBC: 3.35 MIL/uL — ABNORMAL LOW (ref 3.87–5.11)
RDW: 14.7 % (ref 11.5–15.5)
WBC Count: 2.6 10*3/uL — ABNORMAL LOW (ref 4.0–10.5)
nRBC: 0 % (ref 0.0–0.2)

## 2022-10-22 LAB — CMP (CANCER CENTER ONLY)
ALT: 24 U/L (ref 0–44)
AST: 16 U/L (ref 15–41)
Albumin: 3.7 g/dL (ref 3.5–5.0)
Alkaline Phosphatase: 53 U/L (ref 38–126)
Anion gap: 6 (ref 5–15)
BUN: 9 mg/dL (ref 8–23)
CO2: 27 mmol/L (ref 22–32)
Calcium: 9 mg/dL (ref 8.9–10.3)
Chloride: 105 mmol/L (ref 98–111)
Creatinine: 0.49 mg/dL (ref 0.44–1.00)
GFR, Estimated: >60 mL/min (ref >60)
Glucose, Bld: 103 mg/dL — ABNORMAL HIGH (ref 70–99)
Potassium: 4.2 mmol/L (ref 3.5–5.1)
Sodium: 138 mmol/L (ref 135–145)
Total Bilirubin: 0.4 mg/dL (ref 0.3–1.2)
Total Protein: 6 g/dL — ABNORMAL LOW (ref 6.5–8.1)

## 2022-10-22 MED ORDER — HEPARIN SOD (PORK) LOCK FLUSH 100 UNIT/ML IV SOLN
500.0000 [IU] | INTRAVENOUS | Status: AC | PRN
  Administered 2022-10-22: 500 [IU]

## 2022-10-22 MED ORDER — SODIUM CHLORIDE 0.9% FLUSH
10.0000 mL | INTRAVENOUS | Status: AC | PRN
  Administered 2022-10-22: 10 mL

## 2022-10-23 ENCOUNTER — Ambulatory Visit
Admission: RE | Admit: 2022-10-23 | Discharge: 2022-10-23 | Disposition: A | Discharge status: Home / Self Care | Payer: Medicare Other | Source: Ambulatory Visit | Attending: Radiation Oncology | Admitting: Radiation Oncology

## 2022-10-23 ENCOUNTER — Other Ambulatory Visit: Payer: Self-pay

## 2022-10-23 DIAGNOSIS — C3490 Malignant neoplasm of unspecified part of unspecified bronchus or lung: Secondary | ICD-10-CM | POA: Diagnosis not present

## 2022-10-23 LAB — RAD ONC ARIA SESSION SUMMARY
Course Elapsed Days: 0
Plan Fractions Treated to Date: 1
Plan Prescribed Dose Per Fraction: 20 Gy
Plan Total Fractions Prescribed: 1
Plan Total Prescribed Dose: 20 Gy
Reference Point Dosage Given to Date: 20 Gy
Reference Point Session Dosage Given: 20 Gy
Session Number: 1

## 2022-10-23 NOTE — Progress Notes (Incomplete)
Nurse monitoring complete status post SRS treatment. Patient without complaints. Patient denies new or worsening neurologic symptoms. Vitals stable. Instructed patient to avoid strenuous activity for the next 24 hours. Pt is on prednisone. Instructed patient to call [phone] with needs related to treatment after hours or over the weekend. Patient verbalized understanding   Vitals:   10/23/22 1545  BP: (!) 144/68  Pulse: 100  Resp: (!) 24  Temp: 97.7 F (36.5 C)  SpO2: 96%   Pt was weak coming into treatment. She presented to clinic in a wheelchair. She was able to ambulate to bathroom for short distance. Pt was monitored for 30 minutes without complication. She was stable at time of d/c from clinic.

## 2022-10-24 NOTE — Radiation Completion Notes (Signed)
Patient Name: Laurie Mccoy, Laurie Mccoy MRN: [id number] Date of Birth: 11-16-1940 Referring Physician: Curt Bears, M.D. Date of Service: 2022-10-24 Radiation Oncologist: Eppie Gibson, M.D. [place]                             Radiation Oncology End of Treatment Note     Diagnosis: C79.31 Secondary malignant neoplasm of brain Staging on 2022-06-21: Malignant neoplasm of unspecified part of unspecified bronchus or lung ([place]) T=cT2a, N=cN3, M=cM1b Intent: Palliative     ==========DELIVERED PLANS==========  First Treatment Date: 2022-10-23 - Last Treatment Date: 2022-10-23   Plan Name: Brain_SRS_dca Site: Brain Technique: 3D Mode: Photon Dose Per Fraction: 20 Gy Prescribed Dose (Delivered / Prescribed): 20 Gy / 20 Gy Prescribed Fxs (Delivered / Prescribed): 1 / 1     ==========ON TREATMENT VISIT DATES========== 2022-10-23     ==========UPCOMING VISITS==========       ==========APPENDIX - ON TREATMENT VISIT NOTES==========   PatEd 2022-07-10 Ongoing education performed.   ImpPlan 2022-07-10 The patient is tolerating radiation. Continue treatment as planned.   PhysExam 2022-07-10 Alert, no acute distress.   PatEd 2022-07-16 Ongoing education performed.   ImpPlan 2022-07-16 The patient is tolerating radiation. Continue treatment as planned.   PhysExam 2022-07-16 Alert, no acute distress.   RunningNotes 2022-07-16 Sonafine, and education provided on 07/16/22-NS   ProgNote 2022-07-16 Need refills: [  ] Additional  Weekly Progress Notes [ Patient reports tolerating treatments well.  ]    PatEd 2022-07-23 Ongoing education performed.   ImpPlan 2022-07-23 The patient is tolerating radiation. Continue treatment as planned.   PhysExam 2022-07-23 Alert, no acute distress.   ProgNote 2022-07-23 Changes from last week/visit? [ Yes ] Pain? [ No ] Dysphagia? [ No ] Thick saliva/mouth irritation? [ No ] Mouth ulcers? [ No ]  PEG tube? Any issues? [ No ] Have they received chemo (at any point during their radiation treatment)? [ No ] Taking anything by mouth or all via PEG? [ Yes - solid and liquids ] How much clear fluid are they taking in? [ 45oz daily ] Are they doing their salt/baking soda rinses? [ No - nursing reinforced patient education ] Need refill on lotions? [ Yes ] Need refills: [ No ] Additional  Weekly Progress Notes [ Fatigue, muscle weakness in bilateral legs, and acid reflux RX Pantoprazole helping. ]    PatEd 2022-07-26 Ongoing education performed.   ImpPlan 2022-07-26 The patient is tolerating radiation. Continue treatment as planned.   PhysExam 2022-07-26 Alert, no acute distress.   ProgNote 2022-07-26 Changes from last week/visit? [ Yes- bilateral ankle edema ] Pain? [ No ] Dysphagia? [ Yes- w/ liquids ] Thick saliva/mouth irritation? [ No ] Mouth ulcers? [ No ] PEG tube? Any issues? [ No ] Have they received chemo (at any point during their radiation treatment)? [ No ] Taking anything by mouth or all via PEG? [ Yes - solid and liquids ] How much clear fluid are they taking in? [ 40oz daily ] Are they doing their salt/baking soda rinses? [ No - nursing reinforced patient education ] Need refill on lotions? [ No ] Need refills: [ No ] Additional  Weekly Progress Notes [  ]    PatEd 2022-07-30 Ongoing education performed.   ImpPlan 2022-07-30 The patient is tolerating radiation. Continue treatment as planned.   PhysExam 2022-07-30 Alert, no acute distress.   ProgNote 2022-07-30 Changes from last week/visit? [ Yes -  pain with swallowing water ] Pain? [ Yes with swallowing ] Fatigue? [ Yes ] Skin irritation? [ No - using Sonafine ] Shortness of breath? Cough? [ No ] Pain or diff. swallowing? [ Yes - burning especially with water ] Getting chemo/immunotherapy? Last tx: [ No ] Need refills: [ No ] Additional  Weekly Progress Notes [ Taking 1/2 tablet of decadron BID.  Also her HR was low at  32 when she first arrived and went up to 62. ]    PatEd 2022-08-06 Ongoing education performed.   ImpPlan 2022-08-06 The patient is tolerating radiation. Continue treatment as planned.   PhysExam 2022-08-06 Alert, no acute distress.   ProgNote 2022-08-06 Changes from last week/visit? [ No ] Headaches? Cognitive changes? [ No ] Fatigue? Nausea? [ Yes- Fatigue ] Skin irritation? (ex: forehead/scalp/ears) [ Yes- Mild redness ] Vision or auditory changes? [ No ] Diff. doing fine motor skills? [ No ] Aphasia or slurred speech? [ No ] Decadron dose? [ 1/2 Tab BID ] Need refills: [ No ] Additional  Weekly Progress Notes [  ]    PatEd 2022-08-12 Ongoing education performed.   ImpPlan 2022-08-12 The patient is tolerating radiation. Continue treatment as planned.   PhysExam 2022-08-12 Alert, no acute distress.   PatEd 2022-08-13 Ongoing education performed.   ImpPlan 2022-08-13 The patient is tolerating radiation. Continue treatment as planned.   PhysExam 2022-08-13 Alert, no acute distress.   ProgNote 2022-08-13 Changes from last week/visit? [ No ] Pain? [ No ] Fatigue? [ Yes ] Skin irritation? [ Yes- redness to left back ] Shortness of breath? Cough? [ Yes- shortness of breath. ] Pain or diff. swallowing? [ No- carafate working well.  ] Getting chemo/immunotherapy? Last tx: [ No ] Need refills: [ No ] Additional  Weekly Progress Notes [ Appetite is good.  ]    PatEd 2022-08-16 Ongoing education performed.   ImpPlan 2022-08-16 The patient is tolerating radiation. Continue treatment as planned.   PhysExam 2022-08-16 Alert, no acute distress.   PatEd 2022-08-20 Ongoing education performed.   ImpPlan 2022-08-20 The patient is tolerating radiation. Continue treatment as planned.   PhysExam 2022-08-20 Alert, no acute distress.   ProgNote 2022-08-20 Changes from last week/visit? [ No ] Pain? [ had some pain in her left side/back during treatment today which  has gone away. ] Fatigue? [ Yes ] Skin irritation? [ Yes - redness on her back.  ] Shortness of breath? Cough? [ Feeling short of breath - worse yesterday - used a nebulizer and took an antacid and feels better today.  Also has a produtive cough with beige sputum ] Pain or diff. swallowing? [ Yes - a little discomfort. Carafate helps ] Getting chemo/immunotherapy? Last tx: [ chemo to start 09/02/2022. ] Need refills: [ Yes - carafate ] Additional  Weekly Progress Notes [ Reports blurry vision the past couple of days. ]

## 2022-10-28 ENCOUNTER — Other Ambulatory Visit: Payer: Medicare Other

## 2022-10-28 MED FILL — Dexamethasone Sodium Phosphate Inj 100 MG/10ML: INTRAMUSCULAR | Qty: 1 | Status: AC

## 2022-10-28 MED FILL — Fosaprepitant Dimeglumine For IV Infusion 150 MG (Base Eq): INTRAVENOUS | Qty: 5 | Status: AC

## 2022-10-28 NOTE — Op Note (Signed)
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 Basilio Cairo  TECHNIQUE:  The patient underwent a radiation treatment planning session in the radiation oncology simulation suite under the care of the radiation oncology physician and physicist.  I participated closely in the radiation treatment planning afterwards. The patient underwent planning CT which was fused to 3T high resolution MRI with 1 mm axial slices.  These images were fused on the planning system.  We contoured the gross target volumes and subsequently expanded this to yield the Planning Target Volume. I actively participated in the planning process.  I helped to define and review the target contours and also the contours of the optic pathway, eyes, brainstem and selected nearby organs at risk.  All the dose constraints for critical structures were reviewed and compared to AAPM Task Group 101.  The prescription dose conformity was reviewed.  I approved the plan electronically.    Accordingly, [place]  was brought to the TrueBeam stereotactic radiation treatment linac and placed in the custom immobilization mask.  The patient was aligned according to the IR fiducial markers with [facility] Exactrac, then orthogonal x-rays were used in ExacTrac with the 6DOF robotic table and the shifts were made to align the patient.  Lorri Frederick received stereotactic radiosurgery to a prescription dose of 20 Gy uneventfully.    The detailed description of the procedure is recorded in the radiation oncology procedure note.  I was present for the duration of the procedure.  DISPOSITION:   Following delivery, the patient was transported to nursing  in stable condition and monitored for possible acute effects to be discharged to home in stable condition with follow-up in one month.  Monia Pouch, DO [facility]

## 2022-10-29 ENCOUNTER — Inpatient Hospital Stay (HOSPITAL_BASED_OUTPATIENT_CLINIC_OR_DEPARTMENT_OTHER): Payer: Medicare Other | Admitting: Internal Medicine

## 2022-10-29 ENCOUNTER — Other Ambulatory Visit: Payer: Self-pay

## 2022-10-29 ENCOUNTER — Other Ambulatory Visit: Payer: Self-pay | Admitting: Cardiovascular Disease

## 2022-10-29 ENCOUNTER — Inpatient Hospital Stay: Payer: Medicare Other

## 2022-10-29 VITALS — HR 99

## 2022-10-29 DIAGNOSIS — C3490 Malignant neoplasm of unspecified part of unspecified bronchus or lung: Secondary | ICD-10-CM

## 2022-10-29 DIAGNOSIS — Z95828 Presence of other vascular implants and grafts: Secondary | ICD-10-CM

## 2022-10-29 DIAGNOSIS — Z5112 Encounter for antineoplastic immunotherapy: Secondary | ICD-10-CM | POA: Diagnosis not present

## 2022-10-29 LAB — CBC WITH DIFFERENTIAL (CANCER CENTER ONLY)
Abs Immature Granulocytes: 0.02 10*3/uL (ref 0.00–0.07)
Basophils Absolute: 0 10*3/uL (ref 0.0–0.1)
Basophils Relative: 1 %
Eosinophils Absolute: 0.1 10*3/uL (ref 0.0–0.5)
Eosinophils Relative: 2 %
HCT: 34.3 % — ABNORMAL LOW (ref 36.0–46.0)
Hemoglobin: 11.7 g/dL — ABNORMAL LOW (ref 12.0–15.0)
Immature Granulocytes: 1 %
Lymphocytes Relative: 28 %
Lymphs Abs: 0.8 10*3/uL (ref 0.7–4.0)
MCH: 32.9 pg (ref 26.0–34.0)
MCHC: 34.1 g/dL (ref 30.0–36.0)
MCV: 96.3 fL (ref 80.0–100.0)
Monocytes Absolute: 0.5 10*3/uL (ref 0.1–1.0)
Monocytes Relative: 17 %
Neutro Abs: 1.4 10*3/uL — ABNORMAL LOW (ref 1.7–7.7)
Neutrophils Relative %: 51 %
Platelet Count: 260 10*3/uL (ref 150–400)
RBC: 3.56 MIL/uL — ABNORMAL LOW (ref 3.87–5.11)
RDW: 15.5 % (ref 11.5–15.5)
WBC Count: 2.8 10*3/uL — ABNORMAL LOW (ref 4.0–10.5)
nRBC: 0 % (ref 0.0–0.2)

## 2022-10-29 LAB — CMP (CANCER CENTER ONLY)
ALT: 21 U/L (ref 0–44)
AST: 15 U/L (ref 15–41)
Albumin: 3.9 g/dL (ref 3.5–5.0)
Alkaline Phosphatase: 61 U/L (ref 38–126)
Anion gap: 8 (ref 5–15)
BUN: 11 mg/dL (ref 8–23)
CO2: 26 mmol/L (ref 22–32)
Calcium: 9.5 mg/dL (ref 8.9–10.3)
Chloride: 105 mmol/L (ref 98–111)
Creatinine: 0.6 mg/dL (ref 0.44–1.00)
GFR, Estimated: >60 mL/min (ref >60)
Glucose, Bld: 124 mg/dL — ABNORMAL HIGH (ref 70–99)
Potassium: 4 mmol/L (ref 3.5–5.1)
Sodium: 139 mmol/L (ref 135–145)
Total Bilirubin: 0.5 mg/dL (ref 0.3–1.2)
Total Protein: 6 g/dL — ABNORMAL LOW (ref 6.5–8.1)

## 2022-10-29 MED ORDER — SODIUM CHLORIDE 0.9 % IV SOLN: Freq: Once | INTRAVENOUS | Status: AC

## 2022-10-29 MED ORDER — SODIUM CHLORIDE 0.9 % IV SOLN
200.0000 mg | Freq: Once | INTRAVENOUS | Status: AC
  Administered 2022-10-29: 200 mg via INTRAVENOUS
  Filled 2022-10-29: qty 200

## 2022-10-29 MED ORDER — SODIUM CHLORIDE 0.9 % IV SOLN
455.5000 mg | Freq: Once | INTRAVENOUS | Status: AC
  Administered 2022-10-29: 450 mg via INTRAVENOUS
  Filled 2022-10-29: qty 45

## 2022-10-29 MED ORDER — SODIUM CHLORIDE 0.9 % IV SOLN
10.0000 mg | Freq: Once | INTRAVENOUS | Status: AC
  Administered 2022-10-29: 10 mg via INTRAVENOUS
  Filled 2022-10-29: qty 10

## 2022-10-29 MED ORDER — SODIUM CHLORIDE 0.9 % IV SOLN
500.0000 mg/m2 | Freq: Once | INTRAVENOUS | Status: AC
  Administered 2022-10-29: 1000 mg via INTRAVENOUS
  Filled 2022-10-29: qty 40

## 2022-10-29 MED ORDER — SODIUM CHLORIDE 0.9% FLUSH
10.0000 mL | INTRAVENOUS | Status: DC | PRN
  Administered 2022-10-29: 10 mL

## 2022-10-29 MED ORDER — SODIUM CHLORIDE 0.9 % IV SOLN
150.0000 mg | Freq: Once | INTRAVENOUS | Status: AC
  Administered 2022-10-29: 150 mg via INTRAVENOUS
  Filled 2022-10-29: qty 150

## 2022-10-29 MED ORDER — HEPARIN SOD (PORK) LOCK FLUSH 100 UNIT/ML IV SOLN
500.0000 [IU] | Freq: Once | INTRAVENOUS | Status: AC | PRN
  Administered 2022-10-29: 500 [IU]

## 2022-10-29 MED ORDER — SODIUM CHLORIDE 0.9% FLUSH
10.0000 mL | INTRAVENOUS | Status: AC | PRN
  Administered 2022-10-29: 10 mL

## 2022-10-29 NOTE — Progress Notes (Signed)
[facility] Telephone:([phone]) [phone]   Fax:(336) [phone]  OFFICE PROGRESS NOTE  Laurie Manson, NP [phone] [address] [place] [address]  DIAGNOSIS: Stage IV (T2 a, N3, M1b) non-small cell lung cancer, adenocarcinoma presented with left upper lobe perihilar mass in addition to left hilar and mediastinal lymphadenopathy as well as left supraclavicular lymphadenopathy with brain metastasis diagnosed in September 2023.   [name]  Detected Alteration(s) / Biomarker(s) Associated FDA-approved therapies Clinical Trial Availability % cfDNA or Amplification  KRAS G12C approved by FDA Adagrasib, Sotorasib Yes 1.7%  TP53 R249S None Yes 1.5%  PRIOR THERAPY: 1) SRS the the metastatic brain lesion under the care of Dr. Basilio Cairo 2) Craniotomy on 07/10/22 under the care of Dr. Jake Samples 3) radiation to the chest under care of Dr. Roselind Messier.  Last dose on 08/23/2022   CURRENT THERAPY: Systemic chemotherapy with carboplatin for an AUC of 5, to 500 mg/m2, and Keytruda 200 mg IV every 3 weeks.  First dose expected on October 08, 2022.  Status post 1 cycle.  INTERVAL HISTORY: Laurie Mccoy 82 y.o. female returns to the clinic today for follow-up visit accompanied by her daughter-in-law Tobi Bastos.  The patient is feeling fine today with no concerning complaints except for itching and mild skin rash on the right arm.  She denied having any current chest pain but has shortness of breath with exertion with no cough or hemoptysis.  She has no nausea, vomiting, diarrhea or constipation.  She has no headache or visual changes.  She has no current fever or chills.  She tolerated the first cycle of her systemic chemotherapy fairly well.  She is here today for evaluation before starting cycle #2.  MEDICAL HISTORY: Past Medical History:  Diagnosis Date   Anxiety    "had panic attacks years ago"   Asthma    COPD (chronic obstructive pulmonary disease) (HCC)    Goiter 2022   History of hiatal hernia     "dx in college, never bothered me"   History of kidney stones 2012   History of radiation therapy    Left Lung-07/10/22-08/23/22- Dr. Antony Blackbird   Hypertension    Lung cancer [facility])    Sleep apnea     ALLERGIES:  is allergic to amoxicillin, compazine [prochlorperazine], norvasc [amlodipine], and trelegy ellipta [fluticasone-umeclidin-vilant].  MEDICATIONS:  Current Outpatient Medications  Medication Sig Dispense Refill   acetaminophen (TYLENOL) 500 MG tablet Take 500 mg by mouth every 6 (six) hours as needed for mild pain or headache.     albuterol (PROVENTIL) (2.5 MG/3ML) 0.083% nebulizer solution Take 3 mLs (2.5 mg total) by nebulization every 4 (four) hours as needed for wheezing or shortness of breath.     albuterol (VENTOLIN HFA) 108 (90 Base) MCG/ACT inhaler Inhale 2 puffs into the lungs every 6 (six) hours as needed for wheezing or shortness of breath. 18 g 1   ALPRAZolam (XANAX) 0.25 MG tablet Take 0.25 mg by mouth at bedtime as needed for anxiety.     Budeson-Glycopyrrol-Formoterol (BREZTRI AEROSPHERE) 160-9-4.8 MCG/ACT AERO INHALE 2 PUFFS BY MOUTH EVERY MORNING AND EVERY NIGHT AT BEDTIME (Patient taking differently: Inhale 2 puffs into the lungs in the morning and at bedtime.) 10.7 g 5   folic acid (FOLVITE) 1 MG tablet Take 1 tablet (1 mg total) by mouth daily. 30 tablet 2   guaiFENesin (MUCINEX) 600 MG 12 hr tablet Take 1 tablet (600 mg total) by mouth 2 (two) times daily.  ipratropium-albuterol (DUONEB) 0.5-2.5 (3) MG/3ML SOLN Take 3 mLs by nebulization every 6 (six) hours as needed. 360 mL 5   levocetirizine (XYZAL) 5 MG tablet Take 5 mg by mouth at bedtime as needed (for seasonal allergies- if not taking generic Claritin).     LORazepam (ATIVAN) 1 MG tablet Take 1 tablet (1 mg total) by mouth as needed. 30 min Prior to MRI or brain radiation procedure (Patient taking differently: Take 1 mg by mouth as needed (30 minutes prior to MRI or brain radiation procedure).) 5 tablet 0    metFORMIN (GLUCOPHAGE-XR) 500 MG 24 hr tablet Take 500 mg by mouth every morning.     nitrofurantoin, macrocrystal-monohydrate, (MACROBID) 100 MG capsule Take 1 capsule (100 mg total) by mouth 2 (two) times daily. (Patient not taking: Reported on 10/16/2022) 10 capsule 0   nystatin (MYCOSTATIN/NYSTOP) powder Apply 1 Application topically 3 (three) times daily. (Patient not taking: Reported on 10/16/2022) 30 g 0   ondansetron (ZOFRAN) 8 MG tablet Take 1 tablet (8 mg total) by mouth every 8 (eight) hours as needed for nausea or vomiting. (Patient not taking: Reported on 10/16/2022) 30 tablet 2   OVER THE COUNTER MEDICATION Apply 1 application  topically See admin instructions. Sonafine Wound Dressing Topical Emulsion- Apply to the back 1-2 times a day (Patient not taking: Reported on 10/16/2022)     pantoprazole (PROTONIX) 20 MG tablet Take 20 mg by mouth every evening.     predniSONE (DELTASONE) 10 MG tablet Prednisone 60 mg daily for 5 more days followed  Prednisone 50 mg daily for 7 days followed by  Prednisone 40 mg daily for 7 days followed by  Prednisone 30 mg daily for 7 days followed by  Prednisone 20 mg daily for 7 days followed by  Prednisone 10 mg daily for 7 days followed by  Prednisone 5 mg daily for 7 days and stop. (Patient not taking: Reported on 10/16/2022) 140 tablet 0   telmisartan (MICARDIS) 20 MG tablet TAKE 1 TABLET AT BEDTIME 30 tablet 1   traMADol (ULTRAM) 50 MG tablet Take 2 tablets (100 mg total) by mouth every 6 (six) hours as needed for severe pain. (Patient taking differently: Take 50 mg by mouth every 6 (six) hours as needed for severe pain.) 30 tablet 0   No current facility-administered medications for this visit.    SURGICAL HISTORY:  Past Surgical History:  Procedure Laterality Date   APPENDECTOMY     APPLICATION OF CRANIAL NAVIGATION N/A 07/11/2022   Procedure: APPLICATION OF CRANIAL NAVIGATION;  Surgeon: Dawley, Alan Mulder, DO;  Location: [facility] OR;  Service:  Neurosurgery;  Laterality: N/A;   BRONCHIAL BIOPSY  06/12/2021   Procedure: BRONCHIAL BIOPSIES;  Surgeon: Josephine Igo, DO;  Location: [facility] ENDOSCOPY;  Service: Pulmonary;;   BRONCHIAL BRUSHINGS  06/12/2021   Procedure: BRONCHIAL BRUSHINGS;  Surgeon: Josephine Igo, DO;  Location: [facility];  Service: Pulmonary;;   BRONCHIAL WASHINGS  06/12/2021   Procedure: BRONCHIAL WASHINGS;  Surgeon: Josephine Igo, DO;  Location: [facility];  Service: Pulmonary;;   CHOLECYSTECTOMY     COLON RESECTION     CRANIOTOMY N/A 07/11/2022   Procedure: Craniotomy for resection of tumor;  Surgeon: Bethann Goo, DO;  Location: [facility] OR;  Service: Neurosurgery;  Laterality: N/A;  RM 19   FIDUCIAL MARKER PLACEMENT  06/12/2021   Procedure: FIDUCIAL MARKER PLACEMENT;  Surgeon: Josephine Igo, DO;  Location: [facility];  Service: Pulmonary;;   hernia  x 2   IR IMAGING GUIDED PORT INSERTION  10/11/2022   TONSILLECTOMY     VIDEO BRONCHOSCOPY WITH ENDOBRONCHIAL NAVIGATION Bilateral 06/12/2021   Procedure: VIDEO BRONCHOSCOPY WITH ENDOBRONCHIAL NAVIGATION;  Surgeon: Josephine Igo, DO;  Location: [facility];  Service: Pulmonary;  Laterality: Bilateral;  ION   VIDEO BRONCHOSCOPY WITH RADIAL ENDOBRONCHIAL ULTRASOUND  06/12/2021   Procedure: RADIAL ENDOBRONCHIAL ULTRASOUND;  Surgeon: Josephine Igo, DO;  Location: [facility];  Service: Pulmonary;;    REVIEW OF SYSTEMS:  Constitutional: positive for fatigue Eyes: negative Ears, nose, mouth, throat, and face: negative Respiratory: positive for dyspnea on exertion Cardiovascular: negative Gastrointestinal: negative Genitourinary:negative Integument/breast: positive for dryness and pruritus Hematologic/lymphatic: negative Musculoskeletal:negative Neurological: negative Behavioral/Psych: negative Endocrine: negative Allergic/Immunologic: negative   PHYSICAL EXAMINATION: General appearance: alert, cooperative, fatigued, and no distress Head:  Normocephalic, without obvious abnormality, atraumatic Neck: no adenopathy, no JVD, supple, symmetrical, trachea midline, and thyroid not enlarged, symmetric, no tenderness/mass/nodules Lymph nodes: Cervical, supraclavicular, and axillary nodes normal. Resp: clear to auscultation bilaterally Back: symmetric, no curvature. ROM normal. No CVA tenderness. Cardio: regular rate and rhythm, S1, S2 normal, no murmur, click, rub or gallop GI: soft, non-tender; bowel sounds normal; no masses,  no organomegaly Extremities: extremities normal, atraumatic, no cyanosis or edema Neurologic: Alert and oriented X 3, normal strength and tone. Normal symmetric reflexes. Normal coordination and gait  ECOG PERFORMANCE STATUS: 1 - Symptomatic but completely ambulatory  Blood pressure (!) 155/81, pulse (!) 110, temperature 98.1 F (36.7 C), temperature source Oral, resp. rate 17, weight 204 lb (92.5 kg), SpO2 97 %.  LABORATORY DATA: Lab Results  Component Value Date   WBC 2.8 (L) 10/29/2022   HGB 11.7 (L) 10/29/2022   HCT 34.3 (L) 10/29/2022   MCV 96.3 10/29/2022   PLT 260 10/29/2022      Chemistry      Component Value Date/Time   NA 138 10/22/2022 1327   NA 139 09/17/2019 1550   K 4.2 10/22/2022 1327   CL 105 10/22/2022 1327   CO2 27 10/22/2022 1327   BUN 9 10/22/2022 1327   BUN 9 09/17/2019 1550   CREATININE 0.49 10/22/2022 1327      Component Value Date/Time   CALCIUM 9.0 10/22/2022 1327   ALKPHOS 53 10/22/2022 1327   AST 16 10/22/2022 1327   ALT 24 10/22/2022 1327   BILITOT 0.4 10/22/2022 1327       RADIOGRAPHIC STUDIES: MR Brain W Wo Contrast  Result Date: 10/12/2022 CLINICAL DATA:  Brain metastases, assess treatment response 3TSRS Protocol EXAM: MRI HEAD WITHOUT AND WITH CONTRAST TECHNIQUE: Multiplanar, multiecho pulse sequences of the brain and surrounding structures were obtained without and with intravenous contrast. CONTRAST:  9 mL Vueway IV. COMPARISON:  MRI head July 13, 2022. FINDINGS: Motion limited postcontrast imaging. Brain: Approximately 7 mm enhancing lesion in the lateral, posterior and inferior right temporal lobe (series 13, image 51) appears more posterior than the previously noted right temporal lobe lesion and therefore is suspicious for a new metastatic lesion. The previously noted lesion in the left occipital lobe and in the more anterior right temporal lobe are no longer seen. No definite additional new lesions identified; however, motion does limit assessment. At the site of prior right occipital craniotomy and resection of the right occipital metastasis there is no masslike enhancement to suggest residual malignancy. Mild linear enhancement along the periphery of the resection cavity is probably postoperative. No evidence of acute infarct, acute hemorrhage, midline shift or hydrocephalus. Vascular:  Major arterial flow voids are maintained at the skull base. Skull and upper cervical spine: Normal marrow signal. Sinuses/Orbits: Clear sinuses.  No acute orbital findings. Other: No mastoid effusions. IMPRESSION: 1. Approximately 7 mm enhancing lesion in the lateral, posterior and inferior right temporal lobe (series 13, image 51) appears more posterior than the previously noted right temporal lobe lesion and therefore is suspicious for a new metastatic lesion. No evidence of additional new lesions on the motion limited postcontrast imaging. 2. The previously noted lesion in the left occipital lobe and in the more anterior right temporal lobe are no longer seen. 3. Prior right occipital metastasis resection without evidence of residual tumor. Electronically Signed   By: Feliberto Harts M.D.   On: 10/12/2022 17:12   IR IMAGING GUIDED PORT INSERTION  Result Date: 10/11/2022 CLINICAL DATA:  METASTATIC LUNG CANCER, ACCESS FOR CHEMOTHERAPY EXAM: RIGHT INTERNAL JUGULAR SINGLE LUMEN POWER PORT CATHETER INSERTION Date:  10/11/2022 10/11/2022 2:38 pm Radiologist:  M. Ruel Favors, MD Guidance:  Ultrasound and fluoroscopic MEDICATIONS: 1% lidocaine local with epinephrine ANESTHESIA/SEDATION: Versed 2.0 mg IV; Fentanyl 100 mcg IV; Moderate Sedation Time:  29 minutes The patient was continuously monitored during the procedure by the interventional radiology nurse under my direct supervision. FLUOROSCOPY: 0 minutes, 42 seconds (2.0 mGy) COMPLICATIONS: None immediate. CONTRAST:  None. PROCEDURE: Informed consent was obtained from the patient following explanation of the procedure, risks, benefits and alternatives. The patient understands, agrees and consents for the procedure. All questions were addressed. A time out was performed. Maximal barrier sterile technique utilized including caps, mask, sterile gowns, sterile gloves, large sterile drape, hand hygiene, and 2% chlorhexidine scrub. Under sterile conditions and local anesthesia, right internal jugular micropuncture venous access was performed. Access was performed with ultrasound. Images were obtained for documentation of the patent right internal jugular vein. A guide wire was inserted followed by a transitional dilator. This allowed insertion of a guide wire and catheter into the IVC. Measurements were obtained from the SVC / RA junction back to the right IJ venotomy site. In the right infraclavicular chest, a subcutaneous pocket was created over the second anterior rib. This was done under sterile conditions and local anesthesia. 1% lidocaine with epinephrine was utilized for this. A 2.5 cm incision was made in the skin. Blunt dissection was performed to create a subcutaneous pocket over the right pectoralis major muscle. The pocket was flushed with saline vigorously. There was adequate hemostasis. The port catheter was assembled and checked for leakage. The port catheter was secured in the pocket with two retention sutures. The tubing was tunneled subcutaneously to the right venotomy site and inserted into the SVC/RA junction  through a valved peel-away sheath. Position was confirmed with fluoroscopy. Images were obtained for documentation. The patient tolerated the procedure well. No immediate complications. Incisions were closed in a two layer fashion with 4 - 0 Vicryl suture. Dermabond was applied to the skin. The port catheter was accessed, blood was aspirated followed by saline and heparin flushes. Needle was removed. A dry sterile dressing was applied. IMPRESSION: Ultrasound and fluoroscopically guided right internal jugular single lumen power port catheter insertion. Tip in the SVC/RA junction. Catheter ready for use. Electronically Signed   By: Judie Petit.  Shick M.D.   On: 10/11/2022 14:42    ASSESSMENT AND PLAN: This is a very pleasant 82 years old white female with likely stage IV (T2a, N3, M1B) non-small cell lung cancer, adenocarcinoma presented with left upper lobe perihilar mass in addition to  left hilar and mediastinal lymphadenopathy as well as left supraclavicular lymphadenopathy and suspicious solitary brain metastasis diagnosed in September 2023. The molecular studies showed positive KRAS G12C mutation The patient underwent SRS followed by craniotomy on July 10, 2022 to the solitary brain metastasis.  She also underwent palliative radiotherapy to the left upper lobe perihilar mass and mediastinal lymphadenopathy under the care of Dr. Roselind Messier. The patient is currently on systemic chemotherapy with carboplatin for AUC of 5, Alimta 500 Mg/M2 and Keytruda 200 Mg IV every 3 weeks.  First dose October 08, 2022.  Status post 1 cycle. She tolerated the first cycle of her treatment fairly well with no concerning adverse effects except for the pruritus and mild skin rash. I recommended for her to proceed with cycle #2 today as planned. For the skin rash and itching, I recommended for her to use Claritin 10 mg over-the-counter once daily as needed for the itching.  She was also advised to apply hydrocortisone cream to the affected  area. Her total white blood count and absolute neutrophil count are on the low side today but still acceptable for treatment. I strongly advised the patient to call immediately if she develop any fever or chills after her treatment. Will continue to monitor her blood count and if needed will consider her for treatment with filgrastim depending on her counts. The patient will come back for follow-up visit in 3 weeks for evaluation before the next cycle of her treatment. She was advised to call immediately if she has any concerning symptoms in the interval. The patient voices understanding of current disease status and treatment options and is in agreement with the current care plan.  All questions were answered. The patient knows to call the clinic with any problems, questions or concerns. We can certainly see the patient much sooner if necessary.  The total time spent in the appointment was 30 minutes.  Disclaimer: This note was dictated with voice recognition software. Similar sounding words can inadvertently be transcribed and may not be corrected upon review.

## 2022-10-29 NOTE — Progress Notes (Signed)
Per Dr. Arbutus Ped it is ok to treat pt today with Carboplatin,alimta and keytruda and ANC of 1.4.

## 2022-10-29 NOTE — Patient Instructions (Signed)
[place] [facility]  Discharge Instructions: Thank you for choosing [facility] to provide your oncology and hematology care.   If you have a lab appointment with the Cancer Center, please go directly to the Cancer Center and check in at the registration area.   Wear comfortable clothing and clothing appropriate for easy access to any Portacath or PICC line.   We strive to give you quality time with your provider. You may need to reschedule your appointment if you arrive late (15 or more minutes).  Arriving late affects you and other patients whose appointments are after yours.  Also, if you miss three or more appointments without notifying the office, you may be dismissed from the clinic at the provider's discretion.      For prescription refill requests, have your pharmacy contact our office and allow 72 hours for refills to be completed.    Today you received the following chemotherapy and/or immunotherapy agents Keytruda, Alimta, Carboplatin.      To help prevent nausea and vomiting after your treatment, we encourage you to take your nausea medication as directed.  BELOW ARE SYMPTOMS THAT SHOULD BE REPORTED IMMEDIATELY: *FEVER GREATER THAN 100.4 F (38 C) OR HIGHER *CHILLS OR SWEATING *NAUSEA AND VOMITING THAT IS NOT CONTROLLED WITH YOUR NAUSEA MEDICATION *UNUSUAL SHORTNESS OF BREATH *UNUSUAL BRUISING OR BLEEDING *URINARY PROBLEMS (pain or burning when urinating, or frequent urination) *BOWEL PROBLEMS (unusual diarrhea, constipation, pain near the anus) TENDERNESS IN MOUTH AND THROAT WITH OR WITHOUT PRESENCE OF ULCERS (sore throat, sores in mouth, or a toothache) UNUSUAL RASH, SWELLING OR PAIN  UNUSUAL VAGINAL DISCHARGE OR ITCHING   Items with * indicate a potential emergency and should be followed up as soon as possible or go to the Emergency Department if any problems should occur.  Please show the CHEMOTHERAPY ALERT CARD or IMMUNOTHERAPY  ALERT CARD at check-in to the Emergency Department and triage nurse.  Should you have questions after your visit or need to cancel or reschedule your appointment, please contact [place] [facility]  Dept: [phone]  and follow the prompts.  Office hours are 8:00 a.m. to 4:30 p.m. Monday - Friday. Please note that voicemails left after 4:00 p.m. may not be returned until the following business day.  We are closed weekends and major holidays. You have access to a nurse at all times for urgent questions. Please call the main number to the clinic Dept: [phone] and follow the prompts.   For any non-urgent questions, you may also contact your provider using MyChart. We now offer e-Visits for anyone 83 and older to request care online for non-urgent symptoms. For details visit [email or web address].   Also download the MyChart app! Go to the app store, search "MyChart", open the app, select [facility], and log in with your MyChart username and password.

## 2022-11-04 ENCOUNTER — Other Ambulatory Visit: Payer: Medicare Other

## 2022-11-04 ENCOUNTER — Ambulatory Visit: Payer: Medicare Other

## 2022-11-04 ENCOUNTER — Ambulatory Visit: Payer: Medicare Other | Admitting: Physician Assistant

## 2022-11-05 ENCOUNTER — Other Ambulatory Visit: Payer: Self-pay | Admitting: Physician Assistant

## 2022-11-05 ENCOUNTER — Inpatient Hospital Stay: Payer: Medicare Other

## 2022-11-05 ENCOUNTER — Encounter: Payer: Self-pay | Admitting: Internal Medicine

## 2022-11-05 DIAGNOSIS — Z5112 Encounter for antineoplastic immunotherapy: Secondary | ICD-10-CM | POA: Diagnosis not present

## 2022-11-05 DIAGNOSIS — C3490 Malignant neoplasm of unspecified part of unspecified bronchus or lung: Secondary | ICD-10-CM

## 2022-11-05 DIAGNOSIS — Z95828 Presence of other vascular implants and grafts: Secondary | ICD-10-CM | POA: Insufficient documentation

## 2022-11-05 DIAGNOSIS — G6289 Other specified polyneuropathies: Secondary | ICD-10-CM

## 2022-11-05 LAB — CBC WITH DIFFERENTIAL (CANCER CENTER ONLY)
Abs Immature Granulocytes: 0.01 10*3/uL (ref 0.00–0.07)
Basophils Absolute: 0 10*3/uL (ref 0.0–0.1)
Basophils Relative: 1 %
Eosinophils Absolute: 0 10*3/uL (ref 0.0–0.5)
Eosinophils Relative: 1 %
HCT: 34.7 % — ABNORMAL LOW (ref 36.0–46.0)
Hemoglobin: 11.8 g/dL — ABNORMAL LOW (ref 12.0–15.0)
Immature Granulocytes: 1 %
Lymphocytes Relative: 40 %
Lymphs Abs: 0.6 10*3/uL — ABNORMAL LOW (ref 0.7–4.0)
MCH: 32.2 pg (ref 26.0–34.0)
MCHC: 34 g/dL (ref 30.0–36.0)
MCV: 94.6 fL (ref 80.0–100.0)
Monocytes Absolute: 0.2 10*3/uL (ref 0.1–1.0)
Monocytes Relative: 15 %
Neutro Abs: 0.7 10*3/uL — ABNORMAL LOW (ref 1.7–7.7)
Neutrophils Relative %: 42 %
Platelet Count: 136 10*3/uL — ABNORMAL LOW (ref 150–400)
RBC: 3.67 MIL/uL — ABNORMAL LOW (ref 3.87–5.11)
RDW: 14.6 % (ref 11.5–15.5)
WBC Count: 1.6 10*3/uL — ABNORMAL LOW (ref 4.0–10.5)
nRBC: 0 % (ref 0.0–0.2)

## 2022-11-05 LAB — CMP (CANCER CENTER ONLY)
ALT: 15 U/L (ref 0–44)
AST: 14 U/L — ABNORMAL LOW (ref 15–41)
Albumin: 3.9 g/dL (ref 3.5–5.0)
Alkaline Phosphatase: 63 U/L (ref 38–126)
Anion gap: 7 (ref 5–15)
BUN: 14 mg/dL (ref 8–23)
CO2: 26 mmol/L (ref 22–32)
Calcium: 9.3 mg/dL (ref 8.9–10.3)
Chloride: 104 mmol/L (ref 98–111)
Creatinine: 0.58 mg/dL (ref 0.44–1.00)
GFR, Estimated: >60 mL/min (ref >60)
Glucose, Bld: 133 mg/dL — ABNORMAL HIGH (ref 70–99)
Potassium: 3.9 mmol/L (ref 3.5–5.1)
Sodium: 137 mmol/L (ref 135–145)
Total Bilirubin: 0.5 mg/dL (ref 0.3–1.2)
Total Protein: 6.3 g/dL — ABNORMAL LOW (ref 6.5–8.1)

## 2022-11-05 MED ORDER — HEPARIN SOD (PORK) LOCK FLUSH 100 UNIT/ML IV SOLN
500.0000 [IU] | Freq: Once | INTRAVENOUS | Status: AC
  Administered 2022-11-05: 500 [IU]

## 2022-11-05 MED ORDER — SODIUM CHLORIDE 0.9% FLUSH
10.0000 mL | Freq: Once | INTRAVENOUS | Status: AC
  Administered 2022-11-05: 10 mL

## 2022-11-05 MED ORDER — GABAPENTIN 100 MG PO CAPS: 100.0000 mg | ORAL_CAPSULE | Freq: Two times a day (BID) | ORAL | 2 refills | Status: AC

## 2022-11-07 ENCOUNTER — Other Ambulatory Visit: Payer: Self-pay | Admitting: Radiation Therapy

## 2022-11-07 DIAGNOSIS — C7931 Secondary malignant neoplasm of brain: Secondary | ICD-10-CM

## 2022-11-08 ENCOUNTER — Other Ambulatory Visit: Payer: Self-pay

## 2022-11-11 ENCOUNTER — Other Ambulatory Visit: Payer: Medicare Other

## 2022-11-12 ENCOUNTER — Inpatient Hospital Stay: Payer: Medicare Other | Attending: Internal Medicine

## 2022-11-12 ENCOUNTER — Telehealth: Payer: Self-pay

## 2022-11-12 ENCOUNTER — Telehealth: Payer: Self-pay | Admitting: Radiation Therapy

## 2022-11-12 ENCOUNTER — Other Ambulatory Visit: Payer: Self-pay

## 2022-11-12 DIAGNOSIS — Z923 Personal history of irradiation: Secondary | ICD-10-CM | POA: Insufficient documentation

## 2022-11-12 DIAGNOSIS — Z79631 Long term (current) use of antimetabolite agent: Secondary | ICD-10-CM | POA: Insufficient documentation

## 2022-11-12 DIAGNOSIS — C3412 Malignant neoplasm of upper lobe, left bronchus or lung: Secondary | ICD-10-CM | POA: Insufficient documentation

## 2022-11-12 DIAGNOSIS — Z5112 Encounter for antineoplastic immunotherapy: Secondary | ICD-10-CM | POA: Insufficient documentation

## 2022-11-12 DIAGNOSIS — G629 Polyneuropathy, unspecified: Secondary | ICD-10-CM | POA: Insufficient documentation

## 2022-11-12 DIAGNOSIS — Z7952 Long term (current) use of systemic steroids: Secondary | ICD-10-CM | POA: Insufficient documentation

## 2022-11-12 DIAGNOSIS — Z880 Allergy status to penicillin: Secondary | ICD-10-CM | POA: Diagnosis not present

## 2022-11-12 DIAGNOSIS — Z9049 Acquired absence of other specified parts of digestive tract: Secondary | ICD-10-CM | POA: Diagnosis not present

## 2022-11-12 DIAGNOSIS — Z7984 Long term (current) use of oral hypoglycemic drugs: Secondary | ICD-10-CM | POA: Diagnosis not present

## 2022-11-12 DIAGNOSIS — Z79630 Long term (current) use of alkylating agent: Secondary | ICD-10-CM | POA: Diagnosis not present

## 2022-11-12 DIAGNOSIS — F419 Anxiety disorder, unspecified: Secondary | ICD-10-CM | POA: Diagnosis not present

## 2022-11-12 DIAGNOSIS — Z79620 Long term (current) use of immunosuppressive biologic: Secondary | ICD-10-CM | POA: Insufficient documentation

## 2022-11-12 DIAGNOSIS — Z79899 Other long term (current) drug therapy: Secondary | ICD-10-CM | POA: Insufficient documentation

## 2022-11-12 DIAGNOSIS — C7931 Secondary malignant neoplasm of brain: Secondary | ICD-10-CM | POA: Diagnosis not present

## 2022-11-12 DIAGNOSIS — K1379 Other lesions of oral mucosa: Secondary | ICD-10-CM | POA: Insufficient documentation

## 2022-11-12 DIAGNOSIS — C3490 Malignant neoplasm of unspecified part of unspecified bronchus or lung: Secondary | ICD-10-CM

## 2022-11-12 DIAGNOSIS — Z5111 Encounter for antineoplastic chemotherapy: Secondary | ICD-10-CM | POA: Insufficient documentation

## 2022-11-12 DIAGNOSIS — Z95828 Presence of other vascular implants and grafts: Secondary | ICD-10-CM

## 2022-11-12 DIAGNOSIS — Z87442 Personal history of urinary calculi: Secondary | ICD-10-CM | POA: Diagnosis not present

## 2022-11-12 DIAGNOSIS — Z888 Allergy status to other drugs, medicaments and biological substances status: Secondary | ICD-10-CM | POA: Diagnosis not present

## 2022-11-12 LAB — CBC WITH DIFFERENTIAL (CANCER CENTER ONLY)
Abs Immature Granulocytes: 0.02 10*3/uL (ref 0.00–0.07)
Basophils Absolute: 0 10*3/uL (ref 0.0–0.1)
Basophils Relative: 1 %
Eosinophils Absolute: 0 10*3/uL (ref 0.0–0.5)
Eosinophils Relative: 1 %
HCT: 32.5 % — ABNORMAL LOW (ref 36.0–46.0)
Hemoglobin: 11 g/dL — ABNORMAL LOW (ref 12.0–15.0)
Immature Granulocytes: 1 %
Lymphocytes Relative: 15 %
Lymphs Abs: 0.6 10*3/uL — ABNORMAL LOW (ref 0.7–4.0)
MCH: 32.6 pg (ref 26.0–34.0)
MCHC: 33.8 g/dL (ref 30.0–36.0)
MCV: 96.4 fL (ref 80.0–100.0)
Monocytes Absolute: 0.7 10*3/uL (ref 0.1–1.0)
Monocytes Relative: 15 %
Neutro Abs: 3 10*3/uL (ref 1.7–7.7)
Neutrophils Relative %: 67 %
Platelet Count: 192 10*3/uL (ref 150–400)
RBC: 3.37 MIL/uL — ABNORMAL LOW (ref 3.87–5.11)
RDW: 15.7 % — ABNORMAL HIGH (ref 11.5–15.5)
WBC Count: 4.4 10*3/uL (ref 4.0–10.5)
nRBC: 0 % (ref 0.0–0.2)

## 2022-11-12 LAB — CMP (CANCER CENTER ONLY)
ALT: 23 U/L (ref 0–44)
AST: 17 U/L (ref 15–41)
Albumin: 3.8 g/dL (ref 3.5–5.0)
Alkaline Phosphatase: 62 U/L (ref 38–126)
Anion gap: 7 (ref 5–15)
BUN: 10 mg/dL (ref 8–23)
CO2: 27 mmol/L (ref 22–32)
Calcium: 9.2 mg/dL (ref 8.9–10.3)
Chloride: 105 mmol/L (ref 98–111)
Creatinine: 0.52 mg/dL (ref 0.44–1.00)
GFR, Estimated: >60 mL/min (ref >60)
Glucose, Bld: 92 mg/dL (ref 70–99)
Potassium: 4.3 mmol/L (ref 3.5–5.1)
Sodium: 139 mmol/L (ref 135–145)
Total Bilirubin: 0.3 mg/dL (ref 0.3–1.2)
Total Protein: 5.9 g/dL — ABNORMAL LOW (ref 6.5–8.1)

## 2022-11-12 MED ORDER — SODIUM CHLORIDE 0.9% FLUSH
10.0000 mL | Freq: Once | INTRAVENOUS | Status: AC
  Administered 2022-11-12: 10 mL

## 2022-11-12 MED ORDER — HEPARIN SOD (PORK) LOCK FLUSH 100 UNIT/ML IV SOLN
500.0000 [IU] | Freq: Once | INTRAVENOUS | Status: AC
  Administered 2022-11-12: 500 [IU]

## 2022-11-12 NOTE — Telephone Encounter (Signed)
I returned a call to Mrs. Girdler regarding a question she had about an MRI to be scheduled. There is an order in place for her 3 month post SRS treatment brain MRI due in April. When the imaging center called her to schedule this, she was confused and thought they were asking to schedule an MRI of her breast. She just wanted clarification and the timing for the next scan before calling them back to schedule. Juliann Pulse is clear now about the scan requested and the timing, and feels much better about setting this up.  Mont Dutton R.T.(R)(T) Radiation Special Procedures Navigator

## 2022-11-12 NOTE — Telephone Encounter (Signed)
After investigating pt orders more. Rn discovered that the MRI was of pt brain that needed to be scheduled (not of her breast which pt though it was). Rn called and left message for pt to have this MRI of her brain scheduled as a follow up for her SRS. RN encouraged pt to call back with any questions or concerns.

## 2022-11-12 NOTE — Telephone Encounter (Signed)
This pt called to inquire of [facility] calling her about scheduling an MRI of her breast. She stated that Dr. Isidore Moos ordered it and that she was not told of this test need. RN will reach out to Dr. Isidore Moos and wait on feedback. Rn will call pt back with this feedback.

## 2022-11-13 ENCOUNTER — Telehealth: Payer: Self-pay | Admitting: Medical Oncology

## 2022-11-13 NOTE — Telephone Encounter (Addendum)
Oxygen - Will Dr. Julien Nordmann discontinue her home oxygen ( through [employer]). Her last O2 sat was 97 % on room air. She has not used it for 1-2 months.   "Do I need to take iron"? . I told her that her hgb was 11 and not needed at this time.

## 2022-11-14 ENCOUNTER — Telehealth: Payer: Self-pay

## 2022-11-14 ENCOUNTER — Other Ambulatory Visit: Payer: Self-pay | Admitting: Radiation Therapy

## 2022-11-14 DIAGNOSIS — C7931 Secondary malignant neoplasm of brain: Secondary | ICD-10-CM

## 2022-11-14 NOTE — Progress Notes (Signed)
Order entered to access and de-access port the day of brain MRI at [place].   Mont Dutton R.T.(R)(T) Radiation Special Procedures Navigator

## 2022-11-14 NOTE — Telephone Encounter (Signed)
This nurse reached out to patient related to her request to discontinue her oxygen. Patient states that she has not used It in 2 months.  This nurse verified that patient has an appointment with her pulmonologist.  Patient states that she was able to get an appointment on 2/20 with [facility].  This nurse informed that this providers office would like her to follow up with Pulmonology for evaluation and let their office decide whether she should discontinue her oxygen orders.  Patient acknowledged understanding.  No further questions or concerns noted.

## 2022-11-17 NOTE — Progress Notes (Unsigned)
[place] OFFICE PROGRESS NOTE  [name], NP [place] [address] [place] [address]  DIAGNOSIS:  Stage IV (T2 a, N3, M1b) non-small cell lung cancer, adenocarcinoma presented with left upper lobe perihilar mass in addition to left hilar and mediastinal lymphadenopathy as well as left supraclavicular lymphadenopathy with brain metastasis diagnosed in September 2023.   [name]   Detected Alteration(s) / Biomarker(s)          Associated FDA-approved therapies  Clinical Trial Availability          % cfDNA or Amplification   KRAS G12C approved by FDA Adagrasib, Sotorasib Yes    1.7%   TP53 R249S None Yes          1.5%  PRIOR THERAPY: 1) SRS the the metastatic brain lesion under the care of Dr. Isidore Moos 2) Craniotomy on 07/10/22 under the care of Dr. Reatha Armour 3) radiation to the chest under care of Dr. Sondra Come.  Last dose on 08/23/2022  CURRENT THERAPY: Systemic chemotherapy with carboplatin for an AUC of 5, to 500 mg/m2, and Keytruda 200 mg IV every 3 weeks.  First dose expected on October 08, 2022.  Status post 2 cycles.   INTERVAL HISTORY: [name] 82 y.o. female returns to the clinic today for a follow-up visit accompanied by her husband.  The patient is currently undergoing palliative systemic chemotherapy with chemotherapy and immunotherapy.  She is tolerating it well without any adverse side effects except for fatigue a few days following treatment. She is wondering if that is normal. She started feeling more like herself this last week and reports she feels good today. She has some questions today, she is wondering about what she can do for generalized weakness and fatigue. She also has some baseline peripheral neuropathy. She has gabapentin for this. She also reports some visual changes since having her brain surgery. She also is wondering if her [name] caused her to have prior pneumonia. She also mentioned to my CMA she gets mouth sores after treatment.   Today  the patient denies any fever, chills, night sweats, unexplained weight loss.  She is wondering if she can discontinue her supplemental oxygen.  She is scheduled to see pulmonary medicine at [facility] on 11/26/2022. She reports overall her breathing is good. She denies significant cough. Denies chest pain or hemoptysis. She reports in the interval she had an episode of chest pain radiating to her back without any associated lightheadedness, diaphoresis, shortness of breath, lightheadedness, etc. She states she was sitting in a chair and had recently ate. She tried to take an antacid without relief. She took tramadol and her pain went away and it has not occurred since. She denies any nausea, vomiting, diarrhea, or constipation.  She denies rashes or skin changes. She has some lower extremity swelling bilaterally R>L if she does not wear her compression socks. She states in the past she had doppler ultrasound which was negative for DVT. She denies erythema or calf pain. The swelling will pit when it occurs. She reports improvement with her compression stockings and elevating her legs at night. This is stable at this time. She denies headaches. She is scheduled for her next routine surveillance brain MRI in April 2024.  She is here today for evaluation repeat blood work before undergoing cycle #3.  MEDICAL HISTORY: Past Medical History:  Diagnosis Date   Anxiety    "had panic attacks years ago"   Asthma    COPD (chronic obstructive  pulmonary disease) ([place])    Goiter 2022   History of hiatal hernia    "dx in college, never bothered me"   History of kidney stones 2012   History of radiation therapy    Left Lung-07/10/22-08/23/22- Dr. Gery Pray   Hypertension    Lung cancer [facility])    Sleep apnea     ALLERGIES:  is allergic to amoxicillin, compazine [prochlorperazine], norvasc [amlodipine], and trelegy ellipta [fluticasone-umeclidin-vilant].  MEDICATIONS:  Current Outpatient Medications  Medication  Sig Dispense Refill   acetaminophen (TYLENOL) 500 MG tablet Take 500 mg by mouth every 6 (six) hours as needed for mild pain or headache.     albuterol (PROVENTIL) (2.5 MG/3ML) 0.083% nebulizer solution Take 3 mLs (2.5 mg total) by nebulization every 4 (four) hours as needed for wheezing or shortness of breath.     albuterol (VENTOLIN HFA) 108 (90 Base) MCG/ACT inhaler Inhale 2 puffs into the lungs every 6 (six) hours as needed for wheezing or shortness of breath. 18 g 1   ALPRAZolam (XANAX) 0.25 MG tablet Take 0.25 mg by mouth at bedtime as needed for anxiety.     Budeson-Glycopyrrol-Formoterol (BREZTRI AEROSPHERE) 160-9-4.8 MCG/ACT AERO INHALE 2 PUFFS BY MOUTH EVERY MORNING AND EVERY NIGHT AT BEDTIME (Patient taking differently: Inhale 2 puffs into the lungs in the morning and at bedtime.) 70.9 g 5   folic acid (FOLVITE) 1 MG tablet Take 1 tablet (1 mg total) by mouth daily. 30 tablet 2   gabapentin (NEURONTIN) 100 MG capsule Take 1 capsule (100 mg total) by mouth 2 (two) times daily. 90 capsule 2   guaiFENesin (MUCINEX) 600 MG 12 hr tablet Take 1 tablet (600 mg total) by mouth 2 (two) times daily.     levocetirizine (XYZAL) 5 MG tablet Take 5 mg by mouth at bedtime as needed (for seasonal allergies- if not taking generic Claritin).     LORazepam (ATIVAN) 1 MG tablet Take 1 tablet (1 mg total) by mouth as needed. 30 min Prior to MRI or brain radiation procedure (Patient taking differently: Take 1 mg by mouth as needed (30 minutes prior to MRI or brain radiation procedure).) 5 tablet 0   metFORMIN (GLUCOPHAGE-XR) 500 MG 24 hr tablet Take 500 mg by mouth every morning.     nystatin (MYCOSTATIN/NYSTOP) powder Apply 1 Application topically 3 (three) times daily. 30 g 0   ondansetron (ZOFRAN) 8 MG tablet Take 1 tablet (8 mg total) by mouth every 8 (eight) hours as needed for nausea or vomiting. 30 tablet 2   OVER THE COUNTER MEDICATION Apply 1 application  topically See admin instructions. Sonafine Wound  Dressing Topical Emulsion- Apply to the back 1-2 times a day     pantoprazole (PROTONIX) 20 MG tablet Take 20 mg by mouth every evening.     predniSONE (DELTASONE) 10 MG tablet Prednisone 60 mg daily for 5 more days followed  Prednisone 50 mg daily for 7 days followed by  Prednisone 40 mg daily for 7 days followed by  Prednisone 30 mg daily for 7 days followed by  Prednisone 20 mg daily for 7 days followed by  Prednisone 10 mg daily for 7 days followed by  Prednisone 5 mg daily for 7 days and stop. 140 tablet 0   telmisartan (MICARDIS) 20 MG tablet Take 1 tablet (20 mg total) by mouth daily. NEED OV. 15 tablet 0   traMADol (ULTRAM) 50 MG tablet Take 2 tablets (100 mg total) by mouth every 6 (six) hours as  needed for severe pain. (Patient taking differently: Take 50 mg by mouth every 6 (six) hours as needed for severe pain.) 30 tablet 0   ipratropium-albuterol (DUONEB) 0.5-2.5 (3) MG/3ML SOLN Take 3 mLs by nebulization every 6 (six) hours as needed. (Patient not taking: Reported on 11/19/2022) 360 mL 5   nitrofurantoin, macrocrystal-monohydrate, (MACROBID) 100 MG capsule Take 1 capsule (100 mg total) by mouth 2 (two) times daily. (Patient not taking: Reported on 11/19/2022) 10 capsule 0   No current facility-administered medications for this visit.   Facility-Administered Medications Ordered in Other Visits  Medication Dose Route Frequency Provider Last Rate Last Admin   CARBOplatin (PARAPLATIN) 450 mg in sodium chloride 0.9 % 250 mL chemo infusion  450 mg Intravenous Once Curt Bears, MD       cyanocobalamin (VITAMIN B12) injection 1,000 mcg  1,000 mcg Intramuscular Once Curt Bears, MD       dexamethasone (DECADRON) 10 mg in sodium chloride 0.9 % 50 mL IVPB  10 mg Intravenous Once Curt Bears, MD       fosaprepitant (EMEND) 150 mg in sodium chloride 0.9 % 145 mL IVPB  150 mg Intravenous Once Curt Bears, MD       heparin lock flush 100 unit/mL  500 Units Intracatheter Once  PRN Curt Bears, MD       pembrolizumab [facility]) 200 mg in sodium chloride 0.9 % 50 mL chemo infusion  200 mg Intravenous Once Curt Bears, MD       PEMEtrexed (ALIMTA) 1,000 mg in sodium chloride 0.9 % 100 mL chemo infusion  500 mg/m2 (Treatment Plan Recorded) Intravenous Once Curt Bears, MD       sodium chloride flush (NS) 0.9 % injection 10 mL  10 mL Intracatheter PRN Curt Bears, MD        SURGICAL HISTORY:  Past Surgical History:  Procedure Laterality Date   APPENDECTOMY     APPLICATION OF CRANIAL NAVIGATION N/A 07/11/2022   Procedure: APPLICATION OF CRANIAL NAVIGATION;  Surgeon: Karsten Ro, DO;  Location: [place];  Service: Neurosurgery;  Laterality: N/A;   BRONCHIAL BIOPSY  06/12/2021   Procedure: BRONCHIAL BIOPSIES;  Surgeon: Garner Nash, DO;  Location: [place] [facility];  Service: Pulmonary;;   BRONCHIAL BRUSHINGS  06/12/2021   Procedure: BRONCHIAL BRUSHINGS;  Surgeon: Garner Nash, DO;  Location: [place] [facility];  Service: Pulmonary;;   BRONCHIAL WASHINGS  06/12/2021   Procedure: BRONCHIAL WASHINGS;  Surgeon: Garner Nash, DO;  Location: [place] [facility];  Service: Pulmonary;;   CHOLECYSTECTOMY     COLON RESECTION     CRANIOTOMY N/A 07/11/2022   Procedure: Craniotomy for resection of tumor;  Surgeon: Karsten Ro, DO;  Location: [place];  Service: Neurosurgery;  Laterality: N/A;  RM 19   FIDUCIAL MARKER PLACEMENT  06/12/2021   Procedure: FIDUCIAL MARKER PLACEMENT;  Surgeon: Garner Nash, DO;  Location: [place] [facility];  Service: Pulmonary;;   hernia     x 2   IR IMAGING GUIDED PORT INSERTION  10/11/2022   TONSILLECTOMY     VIDEO BRONCHOSCOPY WITH ENDOBRONCHIAL NAVIGATION Bilateral 06/12/2021   Procedure: VIDEO BRONCHOSCOPY WITH ENDOBRONCHIAL NAVIGATION;  Surgeon: Garner Nash, DO;  Location: [place];  Service: Pulmonary;  Laterality: Bilateral;  ION   VIDEO BRONCHOSCOPY WITH RADIAL ENDOBRONCHIAL ULTRASOUND  06/12/2021   Procedure: RADIAL  ENDOBRONCHIAL ULTRASOUND;  Surgeon: Garner Nash, DO;  Location: [place] [facility];  Service: Pulmonary;;    REVIEW OF SYSTEMS:   Review of Systems  Constitutional: Positive  for fatigue (worse a few days following treatment). Negative for appetite change, chills, fever and unexpected weight change.  HENT: Positive for mouth sores following treatment. Negative for nosebleeds, sore throat and trouble swallowing.   Eyes: Negative for eye problems and icterus.  Respiratory: Positive for baseline dyspnea on exertion. Negative for cough, hemoptysis, and wheezing.   Cardiovascular: Positive for baseline lower extremity swelling bilaterally (R>L) Negative for chest pain.  Gastrointestinal: Negative for abdominal pain, constipation, diarrhea, nausea and vomiting.  Genitourinary: Negative for bladder incontinence, difficulty urinating, dysuria, frequency and hematuria.   Musculoskeletal: Negative for back pain, gait problem, neck pain and neck stiffness.  Skin: Negative for itching and rash.  Neurological: Negative for dizziness, extremity weakness, gait problem, headaches, light-headedness and seizures.  Hematological: Negative for adenopathy. Does not bruise/bleed easily.  Psychiatric/Behavioral: Positive for  mildly more forgetful today than prior appointments with me. Negative for confusion, depression and sleep disturbance. The patient is not nervous/anxious.     PHYSICAL EXAMINATION:  Blood pressure (!) 144/50, pulse 83, temperature 98.4 F (36.9 C), temperature source Oral, resp. rate 18, height 5\' 6"  (1.676 m), weight 214 lb 4.8 oz (97.2 kg), SpO2 96 %.  ECOG PERFORMANCE STATUS: 2  Physical Exam  Constitutional: Oriented to person, place, and time and well-developed, well-nourished, and in no distress.  HENT:  Head: Normocephalic and atraumatic.  Mouth/Throat: Oropharynx is clear and moist. No oropharyngeal exudate.  Eyes: Conjunctivae are normal. Right eye exhibits no discharge. Left eye  exhibits no discharge. No scleral icterus.  Neck: Normal range of motion. Neck supple.  Cardiovascular: Normal rate, regular rhythm, normal heart sounds and intact distal pulses.   Pulmonary/Chest: Effort normal. Quiet breath sounds bilaterally. No respiratory distress. No wheezes. No rales.  Abdominal: Soft. Bowel sounds are normal. Exhibits no distension and no mass. There is no tenderness.  Musculoskeletal: Normal range of motion. Mild bilateral lower extremity edema R>L,  Lymphadenopathy:    No cervical adenopathy.  Neurological: Alert and oriented to person, place, and time. Exhibits also wasting.  Examined in the wheelchair.  Skin: Skin is warm and dry. No rash noted. Not diaphoretic. No erythema. No pallor.  Psychiatric: Mood, memory and judgment normal. Seemed slightly more forgetful.  Vitals reviewed.  LABORATORY DATA: Lab Results  Component Value Date   WBC 4.2 11/19/2022   HGB 11.2 (L) 11/19/2022   HCT 33.6 (L) 11/19/2022   MCV 96.6 11/19/2022   PLT 255 11/19/2022      Chemistry      Component Value Date/Time   NA 139 11/19/2022 1107   NA 139 09/17/2019 1550   K 3.6 11/19/2022 1107   CL 105 11/19/2022 1107   CO2 27 11/19/2022 1107   BUN 16 11/19/2022 1107   BUN 9 09/17/2019 1550   CREATININE 0.54 11/19/2022 1107      Component Value Date/Time   CALCIUM 9.0 11/19/2022 1107   ALKPHOS 60 11/19/2022 1107   AST 13 (L) 11/19/2022 1107   ALT 16 11/19/2022 1107   BILITOT 0.2 (L) 11/19/2022 1107       RADIOGRAPHIC STUDIES:  No results found.   ASSESSMENT/PLAN:  This is a very pleasant 82 year old Caucasian female with likely stage IV (T2a, N3, M1B) non-small cell lung cancer, adenocarcinoma presented with left upper lobe perihilar mass in addition to left hilar and mediastinal lymphadenopathy as well as left supraclavicular lymphadenopathy and a solitary brain metastasis diagnosed in September 2023. The molecular studies showed positive KRAS G12C mutation.  Discussed  this can be used in the second line setting in the future.   The patient underwent SRS and craniotomy under the care of Dr. Isidore Moos and Dr. Reatha Armour on 07/10/22. Dr. Reatha Armour does not recommend any chemotherapy for at least 1 month from her surgery due to wound healing.    She is recently completed radiation to the chest under the care of Dr. Sondra Come. The last day of radiation was scheduled for 08/23/22  The plan is to undergo systemic chemotherapy wit carboplatin for an AUC of 5, Alimta 500 mg/m, Keytruda 200 mg IV every 3 weeks. She is status post 2 cycles.     Labs were reviewed.  Recommend that she proceed with cycle #3 today scheduled.  I will arrange a restaging CT scan of the chest, abdomen, and pelvis prior to her next cycle of treatment.  Will see her back for follow-up visit in 3 weeks for evaluation repeat blood work before undergoing cycle #4.  She will establish care with her new pulmonology practice on 11/26/2022 through [facility].  The patient and I discussed several management strategies for common side effects of treatment today.  I discussed with the patient that it is not uncommon to have fatigue the week following treatment.  Starting from cycle #5 the patient will start maintenance treatment which hopefully will with having less fatigue in the interval following treatment.  However, the patient is somewhat deconditioned and I did strongly encourage her to try to increase her activity whether that is lifting very light weights while watching TV or using foot pedals or ambulating. We also discussed the rationale behind monitoring her labs closely weekly.   Regarding her mucositis, she does not have any mucositis at this time but I did send Magic mouthwash to the pharmacy should it develop in the interval following this treatment.  For neuropathy, the patient is wanting to use some Epsom salt therapy which should be okay.  However I did let the patient know that some patients  with neuropathy need to be monitoring their feet closely to make sure no open wounds or skin infections.  Regarding the neuropathy, she has a prescription for gabapentin.  We discussed the multiple ways in which this could be taken between once to 3 times a day and titrating up between 100 to 300 mg if needed.  The patient mention she took gabapentin in the past and had some dizziness with higher doses.  Her neuropathy typically bothers her at night and we discussed that she can take 1 to 200 mg at nighttime if needed.  The patient has some chronic bilateral lower extremity swelling which will worsen if she does not use her compression stockings.  We discussed increasing her protein intake, elevating her lower extremities, using compression stockings, and cutting down on foods high in salt.  We did discuss warning signs that would warrant Doppler ultrasound such as erythema, worsening swelling, and calf pain.  She is not having those symptoms at this time and knows to call us if she develops any of those symptoms.  The patient also briefly mentioned a episode where she had some chest and back pain.  We discussed warning symptoms that would warrant emergency room evaluation such as chest pain, shortness of breath, dizziness, lightheadedness, diaphoresis, nausea, vomiting, etc.  The patient is wondering if her Breztri inhaler caused her prior episodes of pneumonia.  I discussed with the patient I am unsure of that as she does have several risk factors for pneumonia such  as having lung malignancy and being immunocompromise.  Recommend she discuss this with her upcoming appointment with her new pulmonologist.  The patient mentions that she had an eye exam prior to her metastatic brain lesion and ever since starting chemo and having her brain radiation and craniotomy that she has been having some increased blurry vision bilaterally and think she needs a stronger prescription.  She is wondering if her chemotherapy  affected her vision.  Once again in cases like this it is sometimes unclear but I do think a component may be related to her brain tumor since it was in the occipital lobe and treatment of her brain tumor.  I encouraged her to follow-up with her ophthalmologist/optometrist and she may ask radiation oncology if that is a side effect as well.   The patient was advised to call immediately if she has any concerning symptoms in the interval. The patient voices understanding of current disease status and treatment options and is in agreement with the current care plan. All questions were answered. The patient knows to call the clinic with any problems, questions or concerns. We can certainly see the patient much sooner if necessary     Orders Placed This Encounter  Procedures   CT Chest W Contrast    Standing Status:   Future    Standing Expiration Date:   11/19/2023    Order Specific Question:   If indicated for the ordered procedure, I authorize the administration of contrast media per Radiology protocol    Answer:   Yes    Order Specific Question:   Does the patient have a contrast media/X-ray dye allergy?    Answer:   No    Order Specific Question:   Preferred imaging location?    Answer:   [facility]   CT Abdomen Pelvis W Contrast    Standing Status:   Future    Standing Expiration Date:   11/19/2023    Order Specific Question:   If indicated for the ordered procedure, I authorize the administration of contrast media per Radiology protocol    Answer:   Yes    Order Specific Question:   Does the patient have a contrast media/X-ray dye allergy?    Answer:   No    Order Specific Question:   Preferred imaging location?    Answer:   [facility]    Order Specific Question:   Is Oral Contrast requested for this exam?    Answer:   Yes, Per Radiology protocol   CBC with Differential ([facility] Only)    Standing Status:   Standing    Number of Occurrences:   6    Standing  Expiration Date:   11/20/2023   CMP ([facility] only)    Standing Status:   Standing    Number of Occurrences:   6    Standing Expiration Date:   11/20/2023     The total time spent in the appointment was 30-39 minutes  [place], PA-C 11/19/22

## 2022-11-18 ENCOUNTER — Other Ambulatory Visit: Payer: Medicare Other

## 2022-11-18 MED FILL — Dexamethasone Sodium Phosphate Inj 100 MG/10ML: INTRAMUSCULAR | Qty: 1 | Status: AC

## 2022-11-18 MED FILL — Fosaprepitant Dimeglumine For IV Infusion 150 MG (Base Eq): INTRAVENOUS | Qty: 5 | Status: AC

## 2022-11-19 ENCOUNTER — Encounter: Payer: Self-pay | Admitting: Internal Medicine

## 2022-11-19 ENCOUNTER — Inpatient Hospital Stay (HOSPITAL_BASED_OUTPATIENT_CLINIC_OR_DEPARTMENT_OTHER): Payer: Medicare Other

## 2022-11-19 ENCOUNTER — Other Ambulatory Visit: Payer: Self-pay

## 2022-11-19 ENCOUNTER — Inpatient Hospital Stay: Payer: Medicare Other

## 2022-11-19 ENCOUNTER — Inpatient Hospital Stay (HOSPITAL_BASED_OUTPATIENT_CLINIC_OR_DEPARTMENT_OTHER): Payer: Medicare Other | Admitting: Physician Assistant

## 2022-11-19 ENCOUNTER — Other Ambulatory Visit (HOSPITAL_COMMUNITY): Payer: Self-pay

## 2022-11-19 VITALS — BP 144/50 | HR 83 | Temp 98.4°F | Resp 18 | Ht 66.0 in | Wt 214.3 lb

## 2022-11-19 VITALS — BP 142/62 | HR 84 | Resp 17

## 2022-11-19 DIAGNOSIS — Z5111 Encounter for antineoplastic chemotherapy: Secondary | ICD-10-CM

## 2022-11-19 DIAGNOSIS — Z5112 Encounter for antineoplastic immunotherapy: Secondary | ICD-10-CM | POA: Diagnosis not present

## 2022-11-19 DIAGNOSIS — Z95828 Presence of other vascular implants and grafts: Secondary | ICD-10-CM

## 2022-11-19 DIAGNOSIS — C3490 Malignant neoplasm of unspecified part of unspecified bronchus or lung: Secondary | ICD-10-CM

## 2022-11-19 DIAGNOSIS — G629 Polyneuropathy, unspecified: Secondary | ICD-10-CM | POA: Diagnosis not present

## 2022-11-19 DIAGNOSIS — K1230 Oral mucositis (ulcerative), unspecified: Secondary | ICD-10-CM

## 2022-11-19 LAB — TSH: TSH: 2.67 u[IU]/mL (ref 0.350–4.500)

## 2022-11-19 LAB — CMP (CANCER CENTER ONLY)
ALT: 16 U/L (ref 0–44)
AST: 13 U/L — ABNORMAL LOW (ref 15–41)
Albumin: 3.9 g/dL (ref 3.5–5.0)
Alkaline Phosphatase: 60 U/L (ref 38–126)
Anion gap: 7 (ref 5–15)
BUN: 16 mg/dL (ref 8–23)
CO2: 27 mmol/L (ref 22–32)
Calcium: 9 mg/dL (ref 8.9–10.3)
Chloride: 105 mmol/L (ref 98–111)
Creatinine: 0.54 mg/dL (ref 0.44–1.00)
GFR, Estimated: >60 mL/min (ref >60)
Glucose, Bld: 124 mg/dL — ABNORMAL HIGH (ref 70–99)
Potassium: 3.6 mmol/L (ref 3.5–5.1)
Sodium: 139 mmol/L (ref 135–145)
Total Bilirubin: 0.2 mg/dL — ABNORMAL LOW (ref 0.3–1.2)
Total Protein: 6 g/dL — ABNORMAL LOW (ref 6.5–8.1)

## 2022-11-19 LAB — CBC WITH DIFFERENTIAL (CANCER CENTER ONLY)
Abs Immature Granulocytes: 0.01 K/uL (ref 0.00–0.07)
Basophils Absolute: 0 K/uL (ref 0.0–0.1)
Basophils Relative: 1 %
Eosinophils Absolute: 0 K/uL (ref 0.0–0.5)
Eosinophils Relative: 1 %
HCT: 33.6 % — ABNORMAL LOW (ref 36.0–46.0)
Hemoglobin: 11.2 g/dL — ABNORMAL LOW (ref 12.0–15.0)
Immature Granulocytes: 0 %
Lymphocytes Relative: 17 %
Lymphs Abs: 0.7 K/uL (ref 0.7–4.0)
MCH: 32.2 pg (ref 26.0–34.0)
MCHC: 33.3 g/dL (ref 30.0–36.0)
MCV: 96.6 fL (ref 80.0–100.0)
Monocytes Absolute: 0.5 K/uL (ref 0.1–1.0)
Monocytes Relative: 13 %
Neutro Abs: 2.9 K/uL (ref 1.7–7.7)
Neutrophils Relative %: 68 %
Platelet Count: 255 K/uL (ref 150–400)
RBC: 3.48 MIL/uL — ABNORMAL LOW (ref 3.87–5.11)
RDW: 16.2 % — ABNORMAL HIGH (ref 11.5–15.5)
WBC Count: 4.2 K/uL (ref 4.0–10.5)
nRBC: 0 % (ref 0.0–0.2)

## 2022-11-19 MED ORDER — SODIUM CHLORIDE 0.9 % IV SOLN
500.0000 mg/m2 | Freq: Once | INTRAVENOUS | Status: AC
  Administered 2022-11-19: 1000 mg via INTRAVENOUS
  Filled 2022-11-19: qty 40

## 2022-11-19 MED ORDER — SODIUM CHLORIDE 0.9% FLUSH
10.0000 mL | Freq: Once | INTRAVENOUS | Status: AC
  Administered 2022-11-19: 10 mL

## 2022-11-19 MED ORDER — SODIUM CHLORIDE 0.9 % IV SOLN: Freq: Once | INTRAVENOUS | Status: AC

## 2022-11-19 MED ORDER — NYSTATIN 100000 UNIT/ML MT SUSP
5.0000 mL | Freq: Three times a day (TID) | OROMUCOSAL | 0 refills | Status: DC | PRN
  Filled 2022-11-19: qty 240, 16d supply, fill #0

## 2022-11-19 MED ORDER — SODIUM CHLORIDE 0.9 % IV SOLN
10.0000 mg | Freq: Once | INTRAVENOUS | Status: AC
  Administered 2022-11-19: 10 mg via INTRAVENOUS
  Filled 2022-11-19: qty 10
  Filled 2022-11-19: qty 1

## 2022-11-19 MED ORDER — HEPARIN SOD (PORK) LOCK FLUSH 100 UNIT/ML IV SOLN
500.0000 [IU] | Freq: Once | INTRAVENOUS | Status: AC | PRN
  Administered 2022-11-19: 500 [IU]

## 2022-11-19 MED ORDER — CYANOCOBALAMIN 1000 MCG/ML IJ SOLN
1000.0000 ug | Freq: Once | INTRAMUSCULAR | Status: AC
  Administered 2022-11-19: 1000 ug via INTRAMUSCULAR
  Filled 2022-11-19: qty 1

## 2022-11-19 MED ORDER — SODIUM CHLORIDE 0.9 % IV SOLN
455.5000 mg | Freq: Once | INTRAVENOUS | Status: AC
  Administered 2022-11-19: 450 mg via INTRAVENOUS
  Filled 2022-11-19: qty 45

## 2022-11-19 MED ORDER — SODIUM CHLORIDE 0.9 % IV SOLN
200.0000 mg | Freq: Once | INTRAVENOUS | Status: AC
  Administered 2022-11-19: 200 mg via INTRAVENOUS
  Filled 2022-11-19: qty 8

## 2022-11-19 MED ORDER — SODIUM CHLORIDE 0.9% FLUSH
10.0000 mL | INTRAVENOUS | Status: DC | PRN
  Administered 2022-11-19: 10 mL

## 2022-11-19 MED ORDER — SODIUM CHLORIDE 0.9 % IV SOLN
150.0000 mg | Freq: Once | INTRAVENOUS | Status: AC
  Administered 2022-11-19: 150 mg via INTRAVENOUS
  Filled 2022-11-19: qty 150
  Filled 2022-11-19: qty 5

## 2022-11-19 NOTE — Patient Instructions (Signed)
[facility]  Discharge Instructions: Thank you for choosing [facility] to provide your oncology and hematology care.   If you have a lab appointment with the [facility], please go directly to the [facility] and check in at the registration area.   Wear comfortable clothing and clothing appropriate for easy access to any Portacath or PICC line.   We strive to give you quality time with your provider. You may need to reschedule your appointment if you arrive late (15 or more minutes).  Arriving late affects you and other patients whose appointments are after yours.  Also, if you miss three or more appointments without notifying the office, you may be dismissed from the clinic at the provider's discretion.      For prescription refill requests, have your pharmacy contact our office and allow 72 hours for refills to be completed.    Today you received the following chemotherapy and/or immunotherapy agents: Keytruda, Alimta, and Carboplatin      To help prevent nausea and vomiting after your treatment, we encourage you to take your nausea medication as directed.  BELOW ARE SYMPTOMS THAT SHOULD BE REPORTED IMMEDIATELY: *FEVER GREATER THAN 100.4 F (38 C) OR HIGHER *CHILLS OR SWEATING *NAUSEA AND VOMITING THAT IS NOT CONTROLLED WITH YOUR NAUSEA MEDICATION *UNUSUAL SHORTNESS OF BREATH *UNUSUAL BRUISING OR BLEEDING *URINARY PROBLEMS (pain or burning when urinating, or frequent urination) *BOWEL PROBLEMS (unusual diarrhea, constipation, pain near the anus) TENDERNESS IN MOUTH AND THROAT WITH OR WITHOUT PRESENCE OF ULCERS (sore throat, sores in mouth, or a toothache) UNUSUAL RASH, SWELLING OR PAIN  UNUSUAL VAGINAL DISCHARGE OR ITCHING   Items with * indicate a potential emergency and should be followed up as soon as possible or go to the Emergency Department if any problems should occur.  Please show the CHEMOTHERAPY ALERT CARD or  IMMUNOTHERAPY ALERT CARD at check-in to the Emergency Department and triage nurse.  Should you have questions after your visit or need to cancel or reschedule your appointment, please contact [facility]  Dept: [phone]  and follow the prompts.  Office hours are 8:00 a.m. to 4:30 p.m. Monday - Friday. Please note that voicemails left after 4:00 p.m. may not be returned until the following business day.  We are closed weekends and major holidays. You have access to a nurse at all times for urgent questions. Please call the main number to the clinic Dept: [phone] and follow the prompts.   For any non-urgent questions, you may also contact your provider using MyChart. We now offer e-Visits for anyone 34 and older to request care online for non-urgent symptoms. For details visit [email or web address].   Also download the MyChart app! Go to the app store, search "MyChart", open the app, select [facility], and log in with your MyChart username and password.

## 2022-11-20 ENCOUNTER — Telehealth: Payer: Self-pay | Admitting: Internal Medicine

## 2022-11-20 ENCOUNTER — Other Ambulatory Visit: Payer: Self-pay | Admitting: Physician Assistant

## 2022-11-20 LAB — T4: T4, Total: 7.6 ug/dL (ref 4.5–12.0)

## 2022-11-20 NOTE — Telephone Encounter (Signed)
Scheduled per 02/13 los/wq, patient has been called and notified of upcoming appointments.

## 2022-11-21 ENCOUNTER — Other Ambulatory Visit: Payer: Self-pay

## 2022-11-25 ENCOUNTER — Ambulatory Visit: Payer: Medicare Other

## 2022-11-25 ENCOUNTER — Other Ambulatory Visit: Payer: Medicare Other

## 2022-11-25 ENCOUNTER — Ambulatory Visit: Payer: Medicare Other | Admitting: Physician Assistant

## 2022-11-25 ENCOUNTER — Inpatient Hospital Stay: Payer: Medicare Other

## 2022-11-26 ENCOUNTER — Inpatient Hospital Stay: Payer: Medicare Other

## 2022-11-26 ENCOUNTER — Ambulatory Visit: Payer: Self-pay | Admitting: Radiation Oncology

## 2022-11-27 ENCOUNTER — Encounter: Payer: Self-pay | Admitting: Radiation Oncology

## 2022-11-27 ENCOUNTER — Inpatient Hospital Stay: Payer: Medicare Other

## 2022-11-27 ENCOUNTER — Ambulatory Visit
Admission: RE | Admit: 2022-11-27 | Discharge: 2022-11-27 | Disposition: A | Discharge status: Home / Self Care | Payer: Medicare Other | Source: Ambulatory Visit | Attending: Radiation Oncology | Admitting: Radiation Oncology

## 2022-11-27 VITALS — BP 135/63 | HR 97 | Temp 97.7°F | Resp 24 | Ht 66.0 in | Wt 211.0 lb

## 2022-11-27 DIAGNOSIS — C3490 Malignant neoplasm of unspecified part of unspecified bronchus or lung: Secondary | ICD-10-CM

## 2022-11-27 DIAGNOSIS — Z79899 Other long term (current) drug therapy: Secondary | ICD-10-CM | POA: Insufficient documentation

## 2022-11-27 DIAGNOSIS — Z5112 Encounter for antineoplastic immunotherapy: Secondary | ICD-10-CM | POA: Diagnosis not present

## 2022-11-27 DIAGNOSIS — Z7984 Long term (current) use of oral hypoglycemic drugs: Secondary | ICD-10-CM | POA: Insufficient documentation

## 2022-11-27 DIAGNOSIS — Z7952 Long term (current) use of systemic steroids: Secondary | ICD-10-CM | POA: Insufficient documentation

## 2022-11-27 DIAGNOSIS — C3412 Malignant neoplasm of upper lobe, left bronchus or lung: Secondary | ICD-10-CM | POA: Insufficient documentation

## 2022-11-27 DIAGNOSIS — F17210 Nicotine dependence, cigarettes, uncomplicated: Secondary | ICD-10-CM | POA: Diagnosis present

## 2022-11-27 DIAGNOSIS — Z95828 Presence of other vascular implants and grafts: Secondary | ICD-10-CM

## 2022-11-27 DIAGNOSIS — C7931 Secondary malignant neoplasm of brain: Secondary | ICD-10-CM

## 2022-11-27 LAB — CMP (CANCER CENTER ONLY)
ALT: 17 U/L (ref 0–44)
AST: 15 U/L (ref 15–41)
Albumin: 3.9 g/dL (ref 3.5–5.0)
Alkaline Phosphatase: 63 U/L (ref 38–126)
Anion gap: 6 (ref 5–15)
BUN: 14 mg/dL (ref 8–23)
CO2: 29 mmol/L (ref 22–32)
Calcium: 9.1 mg/dL (ref 8.9–10.3)
Chloride: 103 mmol/L (ref 98–111)
Creatinine: 0.55 mg/dL (ref 0.44–1.00)
GFR, Estimated: >60 mL/min (ref >60)
Glucose, Bld: 147 mg/dL — ABNORMAL HIGH (ref 70–99)
Potassium: 4.1 mmol/L (ref 3.5–5.1)
Sodium: 138 mmol/L (ref 135–145)
Total Bilirubin: 0.3 mg/dL (ref 0.3–1.2)
Total Protein: 6.1 g/dL — ABNORMAL LOW (ref 6.5–8.1)

## 2022-11-27 LAB — CBC WITH DIFFERENTIAL (CANCER CENTER ONLY)
Abs Immature Granulocytes: 0.02 10*3/uL (ref 0.00–0.07)
Basophils Absolute: 0 10*3/uL (ref 0.0–0.1)
Basophils Relative: 1 %
Eosinophils Absolute: 0 10*3/uL (ref 0.0–0.5)
Eosinophils Relative: 1 %
HCT: 34.2 % — ABNORMAL LOW (ref 36.0–46.0)
Hemoglobin: 11.6 g/dL — ABNORMAL LOW (ref 12.0–15.0)
Immature Granulocytes: 1 %
Lymphocytes Relative: 18 %
Lymphs Abs: 0.5 10*3/uL — ABNORMAL LOW (ref 0.7–4.0)
MCH: 32.8 pg (ref 26.0–34.0)
MCHC: 33.9 g/dL (ref 30.0–36.0)
MCV: 96.6 fL (ref 80.0–100.0)
Monocytes Absolute: 0.4 10*3/uL (ref 0.1–1.0)
Monocytes Relative: 15 %
Neutro Abs: 1.9 10*3/uL (ref 1.7–7.7)
Neutrophils Relative %: 64 %
Platelet Count: 129 10*3/uL — ABNORMAL LOW (ref 150–400)
RBC: 3.54 MIL/uL — ABNORMAL LOW (ref 3.87–5.11)
RDW: 15.4 % (ref 11.5–15.5)
WBC Count: 2.9 10*3/uL — ABNORMAL LOW (ref 4.0–10.5)
nRBC: 0 % (ref 0.0–0.2)

## 2022-11-27 MED ORDER — SODIUM CHLORIDE 0.9% FLUSH
10.0000 mL | Freq: Once | INTRAVENOUS | Status: AC
  Administered 2022-11-27: 10 mL

## 2022-11-27 MED ORDER — HEPARIN SOD (PORK) LOCK FLUSH 100 UNIT/ML IV SOLN
500.0000 [IU] | Freq: Once | INTRAVENOUS | Status: AC
  Administered 2022-11-27: 500 [IU]

## 2022-11-27 NOTE — Progress Notes (Addendum)
Laurie Mccoy presents today for follow-up after completing SRS to her brain on 10/23/2022  Recent neurologic symptoms, if any:  Seizures: Denies Headaches: Reports she had 1 moderate headache alst week, but states it resolved after taking PO Tylenol Nausea: Reports she had nausea after her most recent infusion, but it has since resolved  Wt Readings from Last 3 Encounters:  11/27/22 211 lb (95.7 kg)  11/19/22 214 lb 4.8 oz (97.2 kg)  10/29/22 204 lb (92.5 kg)   Dizziness/ataxia: Continues to feel unsteady on her feet, so she uses her walker when ambulating Difficulty with hand coordination:  Focal numbness/weakness: Reports weakness to her arms and legs, and states it's more prominent in her legs  Visual deficits/changes: Reports her vision has weakened/become more blurry over the last few weeks. She plans to reach out to her optomitrist to see if her prescription glasses need to be adjusted  Confusion/Memory deficits: Unchanged  Other issues of note: saw her medical oncology team on 11/19/22 prior to chemotherapy/immunotherapy infusion. Scheduled for MRI brain w/ & w/o contrast on 01/23/23

## 2022-11-27 NOTE — Progress Notes (Signed)
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 Malignant neoplasm of unspecified part of unspecified bronchus or lung ([place])  C34.90       CHIEF COMPLAINT: fatigue and general weakness     Narrative:   Laurie Mccoy presents today for follow-up after completing SRS to her brain on 10/23/2022  Recent neurologic symptoms, if any:  Seizures: Denies Headaches: Reports she had 1 moderate headache alst week, but states it resolved after taking PO Tylenol Nausea: Reports she had nausea after her most recent infusion, but it has since resolved  Wt Readings from Last 3 Encounters:  11/27/22 211 lb (95.7 kg)  11/19/22 214 lb 4.8 oz (97.2 kg)  10/29/22 204 lb (92.5 kg)   Dizziness/ataxia: Continues to feel unsteady on her feet, so she uses her walker when ambulating Difficulty with hand coordination:  Focal numbness/weakness: Reports weakness to her arms and legs, and states it's more prominent in her legs  Visual deficits/changes: Reports her vision has weakened/become more blurry over the last few weeks. She plans to reach out to her optometrist to see if her prescription glasses need to be adjusted  Confusion/Memory deficits: Unchanged  Other issues of note: saw her medical oncology team on 11/19/22 prior to chemotherapy/immunotherapy infusion. Scheduled for MRI brain w/ & w/o contrast on 01/23/23  ALLERGIES:  is allergic to amoxicillin, compazine [prochlorperazine], norvasc [amlodipine], and trelegy ellipta [fluticasone-umeclidin-vilant].  Meds: Current Outpatient Medications  Medication Sig Dispense Refill   Zinc Acetate, Oral, (ZINC ACETATE  PO) Take 1 tablet by mouth daily. Received from herbalist     acetaminophen (TYLENOL) 500 MG tablet Take 500 mg by mouth every 6 (six) hours as needed for mild pain or headache.     albuterol (PROVENTIL) (2.5 MG/3ML) 0.083% nebulizer solution Take 3 mLs (2.5 mg total) by nebulization every 4 (four) hours as needed for wheezing or shortness of breath.     albuterol (VENTOLIN HFA) 108 (90 Base) MCG/ACT inhaler Inhale 2 puffs into the lungs every 6 (six) hours as needed for wheezing or shortness of breath. 18 g 1   ALPRAZolam (XANAX) 0.25 MG tablet Take 0.25 mg by mouth at bedtime as needed for anxiety.     Budeson-Glycopyrrol-Formoterol (BREZTRI AEROSPHERE) 160-9-4.8 MCG/ACT AERO INHALE 2 PUFFS BY MOUTH EVERY MORNING AND EVERY NIGHT AT BEDTIME (Patient taking differently: Inhale 2 puffs into the lungs in the morning and at bedtime.) 95.6 g 5   folic acid (FOLVITE) 1 MG tablet Take 1 tablet (1 mg total) by mouth daily. 30 tablet 2   gabapentin (NEURONTIN) 100 MG capsule Take 1 capsule (100 mg total) by mouth 2 (two) times daily. 90 capsule 2   guaiFENesin (MUCINEX) 600 MG 12 hr tablet Take 1 tablet (600 mg total) by mouth 2 (two) times daily.     ipratropium-albuterol (DUONEB) 0.5-2.5 (3) MG/3ML SOLN Take 3 mLs by nebulization every 6 (six) hours as needed. (Patient not taking: Reported on 11/19/2022) 360 mL 5   levocetirizine (XYZAL) 5 MG tablet Take 5 mg by mouth at bedtime as needed (for seasonal allergies- if not taking generic Claritin).  LORazepam (ATIVAN) 1 MG tablet Take 1 tablet (1 mg total) by mouth as needed. 30 min Prior to MRI or brain radiation procedure (Patient taking differently: Take 1 mg by mouth as needed (30 minutes prior to MRI or brain radiation procedure).) 5 tablet 0   magic mouthwash (nystatin, diphenhydrAMINE, alum & mag hydroxide) suspension mixture Take 5 mLs by mouth 3 (three) times daily as needed for mouth pain. 240 mL 0   metFORMIN (GLUCOPHAGE-XR) 500 MG 24 hr tablet Take  500 mg by mouth every morning.     nitrofurantoin, macrocrystal-monohydrate, (MACROBID) 100 MG capsule Take 1 capsule (100 mg total) by mouth 2 (two) times daily. (Patient not taking: Reported on 11/19/2022) 10 capsule 0   nystatin (MYCOSTATIN/NYSTOP) powder Apply 1 Application topically 3 (three) times daily. 30 g 0   ondansetron (ZOFRAN) 8 MG tablet Take 1 tablet (8 mg total) by mouth every 8 (eight) hours as needed for nausea or vomiting. 30 tablet 2   OVER THE COUNTER MEDICATION Apply 1 application  topically See admin instructions. Sonafine Wound Dressing Topical Emulsion- Apply to the back 1-2 times a day     pantoprazole (PROTONIX) 20 MG tablet Take 20 mg by mouth every evening.     predniSONE (DELTASONE) 10 MG tablet Prednisone 60 mg daily for 5 more days followed  Prednisone 50 mg daily for 7 days followed by  Prednisone 40 mg daily for 7 days followed by  Prednisone 30 mg daily for 7 days followed by  Prednisone 20 mg daily for 7 days followed by  Prednisone 10 mg daily for 7 days followed by  Prednisone 5 mg daily for 7 days and stop. 140 tablet 0   telmisartan (MICARDIS) 20 MG tablet Take 1 tablet (20 mg total) by mouth daily. NEED OV. 15 tablet 0   traMADol (ULTRAM) 50 MG tablet Take 2 tablets (100 mg total) by mouth every 6 (six) hours as needed for severe pain. (Patient taking differently: Take 50 mg by mouth every 6 (six) hours as needed for severe pain.) 30 tablet 0   No current facility-administered medications for this encounter.    Physical Findings: The patient is in no acute distress. Patient is alert and oriented.  height is 5\' 6"  (1.676 m) and weight is 211 lb (95.7 kg). Her temperature is 97.7 F (36.5 C). Her blood pressure is 135/63 and her pulse is 97. Her respiration is 24 (abnormal) and oxygen saturation is 97%. .  No significant changes. General: Alert and oriented, in no acute distress, mild to moderately labored breathing on RA HEENT: Head is normocephalic.  Extraocular movements are intact. Oropharynx is clear. Musculoskeletal: using a wheelchair today. Well nourished. Strength symmetric Neurologic: Cranial nerves II through XII are grossly intact. No obvious focalities. Speech is fluent. Coordination is intact. Psychiatric: Judgment and insight are intact. Affect is appropriate.  KPS = 60  100 - Normal; no complaints; no evidence of disease. 90   - Able to carry on normal activity; minor signs or symptoms of disease. 80   - Normal activity with effort; some signs or symptoms of disease. 67   - Cares for self; unable to carry on normal activity or to do active work. 60   - Requires occasional assistance, but is able to care for most of his personal needs. 50   - Requires considerable assistance and frequent medical care. 67   - Disabled; requires special care and assistance. 30   - Severely disabled; hospital admission  is indicated although death not imminent. 66   - Very sick; hospital admission necessary; active supportive treatment necessary. 10   - Moribund; fatal processes progressing rapidly. 0     - Dead  Karnofsky DA, Abelmann [place], Craver LS and Burchenal [facility] [phone]) The use of the nitrogen mustards in the palliative treatment of carcinoma: with particular reference to bronchogenic carcinoma Cancer 1 634-56  Lab Findings: Lab Results  Component Value Date   WBC 2.9 (L) 11/27/2022   HGB 11.6 (L) 11/27/2022   HCT 34.2 (L) 11/27/2022   MCV 96.6 11/27/2022   PLT 129 (L) 11/27/2022    Radiographic Findings: No results found.  Impression/Plan:  Patient is present with her husband today. She is recovering well from her most recent brain Kell treatment. She does not exhibit any concerning signs of radiation effects on clinical exam today.   Patient will follow-up with med onc in March after CT of CAP and with Dr. Reatha Armour in April after next brain MRI.   She expressed gratitude for her care at the [facility].  On date of service, in total,  I spent 20 minutes on this encounter. Patient was seen in person.  _____________________________________      Eppie Gibson, MD

## 2022-12-04 ENCOUNTER — Inpatient Hospital Stay: Payer: Medicare Other

## 2022-12-04 DIAGNOSIS — C3490 Malignant neoplasm of unspecified part of unspecified bronchus or lung: Secondary | ICD-10-CM

## 2022-12-04 DIAGNOSIS — Z95828 Presence of other vascular implants and grafts: Secondary | ICD-10-CM

## 2022-12-04 DIAGNOSIS — Z5112 Encounter for antineoplastic immunotherapy: Secondary | ICD-10-CM | POA: Diagnosis not present

## 2022-12-04 LAB — CBC WITH DIFFERENTIAL (CANCER CENTER ONLY)
Abs Immature Granulocytes: 0.01 10*3/uL (ref 0.00–0.07)
Basophils Absolute: 0 10*3/uL (ref 0.0–0.1)
Basophils Relative: 1 %
Eosinophils Absolute: 0.1 10*3/uL (ref 0.0–0.5)
Eosinophils Relative: 2 %
HCT: 33.5 % — ABNORMAL LOW (ref 36.0–46.0)
Hemoglobin: 11 g/dL — ABNORMAL LOW (ref 12.0–15.0)
Immature Granulocytes: 0 %
Lymphocytes Relative: 14 %
Lymphs Abs: 0.5 10*3/uL — ABNORMAL LOW (ref 0.7–4.0)
MCH: 32.1 pg (ref 26.0–34.0)
MCHC: 32.8 g/dL (ref 30.0–36.0)
MCV: 97.7 fL (ref 80.0–100.0)
Monocytes Absolute: 0.4 10*3/uL (ref 0.1–1.0)
Monocytes Relative: 12 %
Neutro Abs: 2.4 10*3/uL (ref 1.7–7.7)
Neutrophils Relative %: 71 %
Platelet Count: 156 10*3/uL (ref 150–400)
RBC: 3.43 MIL/uL — ABNORMAL LOW (ref 3.87–5.11)
RDW: 16.1 % — ABNORMAL HIGH (ref 11.5–15.5)
WBC Count: 3.4 10*3/uL — ABNORMAL LOW (ref 4.0–10.5)
nRBC: 0 % (ref 0.0–0.2)

## 2022-12-04 LAB — CMP (CANCER CENTER ONLY)
ALT: 22 U/L (ref 0–44)
AST: 17 U/L (ref 15–41)
Albumin: 3.8 g/dL (ref 3.5–5.0)
Alkaline Phosphatase: 55 U/L (ref 38–126)
Anion gap: 6 (ref 5–15)
BUN: 9 mg/dL (ref 8–23)
CO2: 27 mmol/L (ref 22–32)
Calcium: 8.4 mg/dL — ABNORMAL LOW (ref 8.9–10.3)
Chloride: 107 mmol/L (ref 98–111)
Creatinine: 0.56 mg/dL (ref 0.44–1.00)
GFR, Estimated: >60 mL/min (ref >60)
Glucose, Bld: 143 mg/dL — ABNORMAL HIGH (ref 70–99)
Potassium: 3.8 mmol/L (ref 3.5–5.1)
Sodium: 140 mmol/L (ref 135–145)
Total Bilirubin: 0.3 mg/dL (ref 0.3–1.2)
Total Protein: 5.6 g/dL — ABNORMAL LOW (ref 6.5–8.1)

## 2022-12-04 MED ORDER — SODIUM CHLORIDE 0.9% FLUSH
10.0000 mL | Freq: Once | INTRAVENOUS | Status: AC
  Administered 2022-12-04: 10 mL

## 2022-12-04 MED ORDER — HEPARIN SOD (PORK) LOCK FLUSH 100 UNIT/ML IV SOLN
500.0000 [IU] | Freq: Once | INTRAVENOUS | Status: AC
  Administered 2022-12-04: 500 [IU]

## 2022-12-06 ENCOUNTER — Ambulatory Visit (HOSPITAL_COMMUNITY)
Admission: RE | Admit: 2022-12-06 | Discharge: 2022-12-06 | Disposition: A | Discharge status: Home / Self Care | Payer: Medicare Other | Source: Ambulatory Visit | Attending: Physician Assistant | Admitting: Physician Assistant

## 2022-12-06 DIAGNOSIS — C3490 Malignant neoplasm of unspecified part of unspecified bronchus or lung: Secondary | ICD-10-CM

## 2022-12-06 MED ORDER — IOHEXOL 300 MG/ML  SOLN
100.0000 mL | Freq: Once | INTRAMUSCULAR | Status: AC | PRN
  Administered 2022-12-06: 100 mL via INTRAVENOUS

## 2022-12-08 NOTE — Progress Notes (Unsigned)
[place] OFFICE PROGRESS NOTE  [name], NP [facility] [address] [place] [address]  DIAGNOSIS: Stage IV (T2 a, N3, M1b) non-small cell lung cancer, adenocarcinoma presented with left upper lobe perihilar mass in addition to left hilar and mediastinal lymphadenopathy as well as left supraclavicular lymphadenopathy with brain metastasis diagnosed in September 2023.   [name]   Detected Alteration(s) / Biomarker(s)          Associated FDA-approved therapies  Clinical Trial Availability          % cfDNA or Amplification   KRAS G12C approved by FDA Adagrasib, Sotorasib Yes    1.7%   TP53 R249S None Yes          1.5%  PRIOR THERAPY:  1) SRS the the metastatic brain lesion under the care of Dr. Isidore Moos 2) Craniotomy on 07/10/22 under the care of Dr. Reatha Armour 3) radiation to the chest under care of Dr. Sondra Come.  Last dose on 08/23/2022  CURRENT THERAPY: Systemic chemotherapy with carboplatin for an AUC of 5, to 500 mg/m2, and Keytruda 200 mg IV every 3 weeks.  First dose expected on October 08, 2022.  Status post 3 cycles.   INTERVAL HISTORY: [name] 82 y.o. female returns to the clinic today for a follow-up visit accompanied by her husband.  The patient is currently undergoing palliative chemotherapy and immunotherapy.  She is status post 3 cycles and she has been tolerating it well except for fatigue and mild nausea the second week of treatment.  She denies any fever, chills, night sweats, or unexplained weight loss. She has a strong appetite. She establish care with pulmonary practice at [facility] on 11/26/2022 and they refilled her Breztri.  Overall, she feels like her shortness of breath with exertion is worse. Her scan did now show any acute changes in her lung. He denies any significant cough, chest pain, or hemoptysis.  She denies any vomiting, diarrhea, or constipation.  Denies any headaches.  She reports increased visual blurring since her diagnosis. She is  wondering if it is from her [place]. She is going to call her eye doctor for an appointment. Of note, she has a history of metastatic disease in the occipital lobe and temporal lobe.  She is due for her next surveillance brain MRI in April 2024.  Denies any rashes or skin changes. She continues to have R>L leg swelling. Last night, she felt a small knot in her calf with associated erythema. She is not on any blood thinners. She recently had a restaging CT scan of the chest, abdomen, pelvis. She is here today for evaluation and to review her scan results before starting cycle #4.  MEDICAL HISTORY: Past Medical History:  Diagnosis Date   Anxiety    "had panic attacks years ago"   Asthma    COPD (chronic obstructive pulmonary disease) ([place])    Goiter 2022   History of hiatal hernia    "dx in college, never bothered me"   History of kidney stones 2012   History of radiation therapy    Left Lung-07/10/22-08/23/22- Dr. Gery Pray   Hypertension    Lung cancer [facility])    Sleep apnea     ALLERGIES:  is allergic to amoxicillin, compazine [prochlorperazine], norvasc [amlodipine], and trelegy ellipta [fluticasone-umeclidin-vilant].  MEDICATIONS:  Current Outpatient Medications  Medication Sig Dispense Refill   acetaminophen (TYLENOL) 500 MG tablet Take 500 mg by mouth every 6 (six) hours as needed for  mild pain or headache.     albuterol (PROVENTIL) (2.5 MG/3ML) 0.083% nebulizer solution Take 3 mLs (2.5 mg total) by nebulization every 4 (four) hours as needed for wheezing or shortness of breath.     albuterol (VENTOLIN HFA) 108 (90 Base) MCG/ACT inhaler Inhale 2 puffs into the lungs every 6 (six) hours as needed for wheezing or shortness of breath. 18 g 1   ALPRAZolam (XANAX) 0.25 MG tablet Take 0.25 mg by mouth at bedtime as needed for anxiety.     Budeson-Glycopyrrol-Formoterol (BREZTRI AEROSPHERE) 160-9-4.8 MCG/ACT AERO INHALE 2 PUFFS BY MOUTH EVERY MORNING AND EVERY NIGHT AT BEDTIME (Patient  taking differently: Inhale 2 puffs into the lungs in the morning and at bedtime.) 0000000 g 5   folic acid (FOLVITE) 1 MG tablet Take 1 tablet (1 mg total) by mouth daily. 30 tablet 2   gabapentin (NEURONTIN) 100 MG capsule Take 1 capsule (100 mg total) by mouth 2 (two) times daily. 90 capsule 2   guaiFENesin (MUCINEX) 600 MG 12 hr tablet Take 1 tablet (600 mg total) by mouth 2 (two) times daily.     levocetirizine (XYZAL) 5 MG tablet Take 5 mg by mouth at bedtime as needed (for seasonal allergies- if not taking generic Claritin).     magic mouthwash (nystatin, diphenhydrAMINE, alum & mag hydroxide) suspension mixture Take 5 mLs by mouth 3 (three) times daily as needed for mouth pain. 240 mL 0   metFORMIN (GLUCOPHAGE-XR) 500 MG 24 hr tablet Take 500 mg by mouth every morning.     OVER THE COUNTER MEDICATION Apply 1 application  topically See admin instructions. Sonafine Wound Dressing Topical Emulsion- Apply to the back 1-2 times a day     pantoprazole (PROTONIX) 20 MG tablet Take 20 mg by mouth every evening.     telmisartan (MICARDIS) 20 MG tablet Take 1 tablet (20 mg total) by mouth daily. NEED OV. 15 tablet 0   traMADol (ULTRAM) 50 MG tablet Take 2 tablets (100 mg total) by mouth every 6 (six) hours as needed for severe pain. (Patient taking differently: Take 50 mg by mouth every 6 (six) hours as needed for severe pain.) 30 tablet 0   Zinc Acetate, Oral, (ZINC ACETATE PO) Take 1 tablet by mouth daily. Received from herbalist     ipratropium-albuterol (DUONEB) 0.5-2.5 (3) MG/3ML SOLN Take 3 mLs by nebulization every 6 (six) hours as needed. (Patient not taking: Reported on 11/19/2022) 360 mL 5   LORazepam (ATIVAN) 1 MG tablet Take 1 tablet (1 mg total) by mouth as needed. 30 min Prior to MRI or brain radiation procedure (Patient not taking: Reported on 12/10/2022) 5 tablet 0   nystatin (MYCOSTATIN/NYSTOP) powder Apply 1 Application topically 3 (three) times daily. (Patient not taking: Reported on  12/10/2022) 30 g 0   ondansetron (ZOFRAN) 8 MG tablet Take 1 tablet (8 mg total) by mouth every 8 (eight) hours as needed for nausea or vomiting. (Patient not taking: Reported on 12/10/2022) 30 tablet 2   No current facility-administered medications for this visit.    SURGICAL HISTORY:  Past Surgical History:  Procedure Laterality Date   APPENDECTOMY     APPLICATION OF CRANIAL NAVIGATION N/A 07/11/2022   Procedure: APPLICATION OF CRANIAL NAVIGATION;  Surgeon: Dawley, Theodoro Doing, DO;  Location: [facility];  Service: Neurosurgery;  Laterality: N/A;   BRONCHIAL BIOPSY  06/12/2021   Procedure: BRONCHIAL BIOPSIES;  Surgeon: Garner Nash, DO;  Location: [facility];  Service: Pulmonary;;   BRONCHIAL BRUSHINGS  06/12/2021   Procedure: BRONCHIAL BRUSHINGS;  Surgeon: Garner Nash, DO;  Location: [facility];  Service: Pulmonary;;   BRONCHIAL WASHINGS  06/12/2021   Procedure: BRONCHIAL WASHINGS;  Surgeon: Garner Nash, DO;  Location: [facility];  Service: Pulmonary;;   CHOLECYSTECTOMY     COLON RESECTION     CRANIOTOMY N/A 07/11/2022   Procedure: Craniotomy for resection of tumor;  Surgeon: Karsten Ro, DO;  Location: [facility];  Service: Neurosurgery;  Laterality: N/A;  RM 19   FIDUCIAL MARKER PLACEMENT  06/12/2021   Procedure: FIDUCIAL MARKER PLACEMENT;  Surgeon: Garner Nash, DO;  Location: [facility];  Service: Pulmonary;;   hernia     x 2   IR IMAGING GUIDED PORT INSERTION  10/11/2022   TONSILLECTOMY     VIDEO BRONCHOSCOPY WITH ENDOBRONCHIAL NAVIGATION Bilateral 06/12/2021   Procedure: VIDEO BRONCHOSCOPY WITH ENDOBRONCHIAL NAVIGATION;  Surgeon: Garner Nash, DO;  Location: [facility];  Service: Pulmonary;  Laterality: Bilateral;  ION   VIDEO BRONCHOSCOPY WITH RADIAL ENDOBRONCHIAL ULTRASOUND  06/12/2021   Procedure: RADIAL ENDOBRONCHIAL ULTRASOUND;  Surgeon: Garner Nash, DO;  Location: [facility];  Service: Pulmonary;;    REVIEW OF SYSTEMS:   Review of Systems   Constitutional: Positive for fatigue a few days after treatment. Negative for appetite change, chills, fever and unexpected weight change.  HENT:   Negative for mouth sores, nosebleeds, sore throat and trouble swallowing.   Eyes: Negative for eye problems and icterus.  Respiratory: Positive for dyspnea on exertion. Negative for cough, hemoptysis, and wheezing.   Cardiovascular: positive for mild bilateral lower extremity swelling (R>L). Negative for chest pain.  Gastrointestinal: Positive for occasional mild nausea week after treatment. Negative for abdominal pain, constipation, diarrhea, and vomiting.  Genitourinary: Negative for bladder incontinence, difficulty urinating, dysuria, frequency and hematuria.   Musculoskeletal: Negative for back pain, gait problem, neck pain and neck stiffness.  Skin: Negative for itching and rash.  Neurological: Negative for dizziness, extremity weakness, gait problem, headaches, light-headedness and seizures.  Hematological: Negative for adenopathy. Does not bruise/bleed easily.  Psychiatric/Behavioral: Negative for confusion, depression and sleep disturbance. The patient is not nervous/anxious   PHYSICAL EXAMINATION:  Blood pressure (!) 140/73, pulse 97, temperature 98.6 F (37 C), temperature source Oral, resp. rate 18, height '5\' 6"'$  (1.676 m), weight 212 lb 6.4 oz (96.3 kg), SpO2 97 %.  ECOG PERFORMANCE STATUS: 1-2  Physical Exam  Constitutional: Oriented to person, place, and time and well-developed, well-nourished, and in no distress.  HENT:  Head: Normocephalic and atraumatic.  Mouth/Throat: Oropharynx is clear and moist. No oropharyngeal exudate.  Eyes: Conjunctivae are normal. Right eye exhibits no discharge. Left eye exhibits no discharge. No scleral icterus.  Neck: Normal range of motion. Neck supple.  Cardiovascular: Normal rate, regular rhythm, normal heart sounds and intact distal pulses.   Pulmonary/Chest: Effort normal. Quiet breath sounds  bilaterally. No respiratory distress. No wheezes. No rales.  Abdominal: Soft. Bowel sounds are normal. Exhibits no distension and no mass. There is no tenderness.  Musculoskeletal: Normal range of motion. Mild bilateral lower extremity edema R>L.  Lymphadenopathy:    No cervical adenopathy.  Neurological: Alert and oriented to person, place, and time. Exhibits also wasting.  Examined in the wheelchair.  Skin: Skin is warm and dry. No rash noted. Not diaphoretic. No erythema. No pallor.  Psychiatric: Mood, memory and judgment normal. Seemed slightly more forgetful.  Vitals reviewed.  LABORATORY DATA: Lab Results  Component Value Date   WBC 3.3 (  L) 12/10/2022   HGB 11.9 (L) 12/10/2022   HCT 35.5 (L) 12/10/2022   MCV 97.3 12/10/2022   PLT 235 12/10/2022      Chemistry      Component Value Date/Time   NA 138 12/10/2022 1006   NA 139 09/17/2019 1550   K 3.7 12/10/2022 1006   CL 103 12/10/2022 1006   CO2 26 12/10/2022 1006   BUN 10 12/10/2022 1006   BUN 9 09/17/2019 1550   CREATININE 0.67 12/10/2022 1006      Component Value Date/Time   CALCIUM 9.2 12/10/2022 1006   ALKPHOS 61 12/10/2022 1006   AST 18 12/10/2022 1006   ALT 22 12/10/2022 1006   BILITOT 0.4 12/10/2022 1006       RADIOGRAPHIC STUDIES:  CT Chest W Contrast  Result Date: 12/09/2022 CLINICAL DATA:  Metastatic lung cancer. History of non-small cell lung cancer. * Tracking Code: BO * EXAM: CT CHEST, ABDOMEN, AND PELVIS WITH CONTRAST TECHNIQUE: Multidetector CT imaging of the chest, abdomen and pelvis was performed following the standard protocol during bolus administration of intravenous contrast. RADIATION DOSE REDUCTION: This exam was performed according to the departmental dose-optimization program which includes automated exposure control, adjustment of the mA and/or kV according to patient size and/or use of iterative reconstruction technique. CONTRAST:  134m OMNIPAQUE IOHEXOL 300 MG/ML  SOLN COMPARISON:  Chest  CT 09/06/2022 and 07/04/2022.  PET-CT 05/02/2022. FINDINGS: CT CHEST FINDINGS Cardiovascular: No acute vascular findings. Right IJ Port-A-Cath extends to the superior cavoatrial junction. There is atherosclerosis of the aorta, great vessels and coronary arteries. The heart size is normal. There is no pericardial effusion. Mediastinum/Nodes: There are no enlarged mediastinal, hilar or axillary lymph nodes. 2.3 x 1.6 cm left thyroid nodule on image 9/2 is similar to prior studies and was not hypermetabolic on PET-CT. The esophagus and trachea appear unremarkable. Lungs/Pleura: Stable small left pleural effusion. No right pleural effusion or pneumothorax. The previously demonstrated left upper lobe perihilar nodule measures approximately 1.9 x 1.1 cm (previously 2.0 x 1.3 cm). Patchy ground-glass opacities are again noted throughout the left upper lobe, similar in distribution to the prior study. There are increased central solid components which appear somewhat band like on the reformatted images, likely related to interval radiation therapy. The part solid right upper lobe nodule and associated fiducial markers are unchanged, measuring up to 1.2 x 1.1 cm on image 57/8. Mild centrilobular and paraseptal emphysema. Musculoskeletal/Chest wall: No chest wall mass or suspicious osseous findings. CT ABDOMEN AND PELVIS FINDINGS Hepatobiliary: The liver is normal in density without suspicious focal abnormality. No evidence of biliary dilatation status post cholecystectomy. Pancreas: Unremarkable. No pancreatic ductal dilatation or surrounding inflammatory changes. Spleen: Normal in size without focal abnormality. Adrenals/Urinary Tract: Both adrenal glands appear normal. Punctate nonobstructing bilateral renal calculi. No evidence of ureteral calculus or hydronephrosis. Stable small cyst in the anterior interpolar region of the left kidney for which no follow-up imaging is recommended. The bladder appears normal for its  degree of distention. Stomach/Bowel: No enteric contrast administered. The stomach appears unremarkable for its degree of distention. No bowel distension, wall thickening or surrounding inflammation. Chronic extension of a portion of the transverse colon into a ventral abdominal wall hernia is unchanged. No evidence of incarceration or obstruction. There is a patent distal colonic anastomosis. Vascular/Lymphatic: There are no enlarged abdominal or pelvic lymph nodes. Diffuse aortic and branch vessel atherosclerosis without evidence of aneurysm or large vessel occlusion. Reproductive: Unchanged fluid density centrally in the uterus  without hypermetabolic activity on prior PET-CT. This measures up to 2.8 cm in length on sagittal image 61/7 and could reflect fluid in a dilated endometrial cavity. No adnexal mass. Other: Stable hernias of the anterior abdominal wall containing fat and a portion of the transverse colon. No evidence of bowel incarceration or obstruction. No ascites or peritoneal nodularity. Unchanged subcutaneous soft tissue nodule medially in the left buttocks measuring 3.1 cm on image 59/4. No associated metabolic activity on prior PET-CT. Musculoskeletal: No acute or significant osseous findings. Mild lumbar facet hypertrophy. IMPRESSION: 1. The treated left upper lobe perihilar nodule is slightly smaller than on the most recent prior study, consistent with positive treatment response. 2. Increased central solid components within the left upper lobe ground-glass opacities, likely related to interval radiation therapy. Recommend attention on follow-up. 3. The part solid right upper lobe pulmonary nodule is unchanged. 4. No evidence of metastatic disease in the abdomen or pelvis. 5. Stable fluid density centrally in the uterus which could reflect fluid in a dilated endometrial cavity or a degenerated fibroid. Consider further evaluation with pelvic ultrasound. 6. Stable ventral abdominal wall hernias  containing fat and a portion of the transverse colon. No evidence of bowel incarceration or obstruction. 7. Stable left thyroid nodule which was not hypermetabolic on prior PET-CT. Recommend thyroid ultrasound if not previously performed.(Ref: J Am Coll Radiol. 2015 Feb;12(2): 143-50). 8. Aortic Atherosclerosis (ICD10-I70.0) and Emphysema (ICD10-J43.9). Electronically Signed   By: Richardean Sale M.D.   On: 12/09/2022 15:31   CT Abdomen Pelvis W Contrast  Result Date: 12/09/2022 CLINICAL DATA:  Metastatic lung cancer. History of non-small cell lung cancer. * Tracking Code: BO * EXAM: CT CHEST, ABDOMEN, AND PELVIS WITH CONTRAST TECHNIQUE: Multidetector CT imaging of the chest, abdomen and pelvis was performed following the standard protocol during bolus administration of intravenous contrast. RADIATION DOSE REDUCTION: This exam was performed according to the departmental dose-optimization program which includes automated exposure control, adjustment of the mA and/or kV according to patient size and/or use of iterative reconstruction technique. CONTRAST:  133m OMNIPAQUE IOHEXOL 300 MG/ML  SOLN COMPARISON:  Chest CT 09/06/2022 and 07/04/2022.  PET-CT 05/02/2022. FINDINGS: CT CHEST FINDINGS Cardiovascular: No acute vascular findings. Right IJ Port-A-Cath extends to the superior cavoatrial junction. There is atherosclerosis of the aorta, great vessels and coronary arteries. The heart size is normal. There is no pericardial effusion. Mediastinum/Nodes: There are no enlarged mediastinal, hilar or axillary lymph nodes. 2.3 x 1.6 cm left thyroid nodule on image 9/2 is similar to prior studies and was not hypermetabolic on PET-CT. The esophagus and trachea appear unremarkable. Lungs/Pleura: Stable small left pleural effusion. No right pleural effusion or pneumothorax. The previously demonstrated left upper lobe perihilar nodule measures approximately 1.9 x 1.1 cm (previously 2.0 x 1.3 cm). Patchy ground-glass opacities  are again noted throughout the left upper lobe, similar in distribution to the prior study. There are increased central solid components which appear somewhat band like on the reformatted images, likely related to interval radiation therapy. The part solid right upper lobe nodule and associated fiducial markers are unchanged, measuring up to 1.2 x 1.1 cm on image 57/8. Mild centrilobular and paraseptal emphysema. Musculoskeletal/Chest wall: No chest wall mass or suspicious osseous findings. CT ABDOMEN AND PELVIS FINDINGS Hepatobiliary: The liver is normal in density without suspicious focal abnormality. No evidence of biliary dilatation status post cholecystectomy. Pancreas: Unremarkable. No pancreatic ductal dilatation or surrounding inflammatory changes. Spleen: Normal in size without focal abnormality. Adrenals/Urinary Tract: Both adrenal  glands appear normal. Punctate nonobstructing bilateral renal calculi. No evidence of ureteral calculus or hydronephrosis. Stable small cyst in the anterior interpolar region of the left kidney for which no follow-up imaging is recommended. The bladder appears normal for its degree of distention. Stomach/Bowel: No enteric contrast administered. The stomach appears unremarkable for its degree of distention. No bowel distension, wall thickening or surrounding inflammation. Chronic extension of a portion of the transverse colon into a ventral abdominal wall hernia is unchanged. No evidence of incarceration or obstruction. There is a patent distal colonic anastomosis. Vascular/Lymphatic: There are no enlarged abdominal or pelvic lymph nodes. Diffuse aortic and branch vessel atherosclerosis without evidence of aneurysm or large vessel occlusion. Reproductive: Unchanged fluid density centrally in the uterus without hypermetabolic activity on prior PET-CT. This measures up to 2.8 cm in length on sagittal image 61/7 and could reflect fluid in a dilated endometrial cavity. No adnexal  mass. Other: Stable hernias of the anterior abdominal wall containing fat and a portion of the transverse colon. No evidence of bowel incarceration or obstruction. No ascites or peritoneal nodularity. Unchanged subcutaneous soft tissue nodule medially in the left buttocks measuring 3.1 cm on image 59/4. No associated metabolic activity on prior PET-CT. Musculoskeletal: No acute or significant osseous findings. Mild lumbar facet hypertrophy. IMPRESSION: 1. The treated left upper lobe perihilar nodule is slightly smaller than on the most recent prior study, consistent with positive treatment response. 2. Increased central solid components within the left upper lobe ground-glass opacities, likely related to interval radiation therapy. Recommend attention on follow-up. 3. The part solid right upper lobe pulmonary nodule is unchanged. 4. No evidence of metastatic disease in the abdomen or pelvis. 5. Stable fluid density centrally in the uterus which could reflect fluid in a dilated endometrial cavity or a degenerated fibroid. Consider further evaluation with pelvic ultrasound. 6. Stable ventral abdominal wall hernias containing fat and a portion of the transverse colon. No evidence of bowel incarceration or obstruction. 7. Stable left thyroid nodule which was not hypermetabolic on prior PET-CT. Recommend thyroid ultrasound if not previously performed.(Ref: J Am Coll Radiol. 2015 Feb;12(2): 143-50). 8. Aortic Atherosclerosis (ICD10-I70.0) and Emphysema (ICD10-J43.9). Electronically Signed   By: Richardean Sale M.D.   On: 12/09/2022 15:31     ASSESSMENT/PLAN:  This is a very pleasant 82 year old Caucasian female with likely stage IV (T2a, N3, M1B) non-small cell lung cancer, adenocarcinoma presented with left upper lobe perihilar mass in addition to left hilar and mediastinal lymphadenopathy as well as left supraclavicular lymphadenopathy and a solitary brain metastasis diagnosed in September 2023. The molecular  studies showed positive KRAS G12C mutation. Discussed this can be used in the second line setting in the future.    The patient underwent SRS and craniotomy under the care of Dr. Isidore Moos and Dr. Reatha Armour on 07/10/22. Dr. Reatha Armour does not recommend any chemotherapy for at least 1 month from her surgery due to wound healing.    She is recently completed radiation to the chest under the care of Dr. Sondra Come. The last day of radiation was scheduled for 08/23/22   The plan is to undergo systemic chemotherapy wit carboplatin for an AUC of 5, Alimta 500 mg/m, Keytruda 200 mg IV every 3 weeks. She is status post 3 cycles.   The patient recently had a restaging CT scan performed.  Dr. Julien Nordmann personally and independently reviewed the scan and discussed the results with the patient today.  The scan showed no evidence of disease progression.  Dr. Julien Nordmann recommends that she continue on the same treatment at the same dose.  She will proceed with cycle #4 today as schedule.  We will see her back for follow-up visit for evaluation and repeat blood work in 3 weeks before starting cycle #5.  She will continue to follow with her new pulmonary practice for her COPD inhalers.   We will order a lower extremity Doppler ultrasound to rule out blood clot in her right lower extremity due to her persistent swelling.  I did not appreciate any significant erythema or warmth on her legs today.  She will continue to monitor for any changes.  The patient is wondering if her Beryle Flock is causing her increased blurry vision since her diagnosis.  I encouraged her to keep the appointment with the eye doctor as scheduled.  Denies any visual field defects, eye erythema, pain, or changes with range of motion.  She sometimes gets "crusty's" in the corner of her eyes which she wipes away.  Eyes appear normal on exam today.  Advised to use warm washcloth for crusting.  The patient was advised to call immediately if she has any concerning  symptoms in the interval. The patient voices understanding of current disease status and treatment options and is in agreement with the current care plan. All questions were answered. The patient knows to call the clinic with any problems, questions or concerns. We can certainly see the patient much sooner if necessary  No orders of the defined types were placed in this encounter.   Vaunda Gutterman L Danaysia Rader, PA-C 12/10/22  ADDENDUM: Hematology/Oncology Attending: I had a face-to-face encounter with the patient today.  I reviewed her record, lab, scan and recommended her care plan.  This is a very pleasant 82 years old white female with stage IV non-small cell lung cancer diagnosed in September 2023 with positive KRAS G12C mutation.  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  She also underwent palliative radiotherapy to the left lung mass under the care of Dr. Sondra Come. She is currently undergoing systemic chemotherapy with carboplatin, Alimta and Keytruda status post 3 cycles.  She has been tolerating this treatment well with no concerning adverse effect except for mild fatigue.  She denied having any significant nausea or vomiting. She had repeat CT scan of the chest, abdomen and pelvis performed recently.  I personally and independently reviewed the scan images and discussed the result with the patient and her husband. Her scan showed improvement in the treated left upper lobe perihilar nodule which is smaller than the most recent imaging studies and consistent with treatment response.  There is no other evidence of progressive disease and no evidence of metastatic disease in the abdomen or pelvis. I recommended for the patient to proceed with cycle #4 today as planned. She will come back for follow-up visit in 3 weeks for evaluation before starting the first cycle of her maintenance therapy with Alimta and Keytruda every 3 weeks starting from  cycle #5. The patient had several questions and I answered them completely to her satisfaction. She was advised to call immediately if she has any other concerning symptoms in the interval. The total time spent in the appointment was 30 minutes. Disclaimer: This note was dictated with voice recognition software. Similar sounding words can inadvertently be transcribed and may be missed upon review. Eilleen Kempf, MD

## 2022-12-10 ENCOUNTER — Other Ambulatory Visit: Payer: Self-pay | Admitting: Physician Assistant

## 2022-12-10 ENCOUNTER — Inpatient Hospital Stay: Payer: Medicare Other

## 2022-12-10 ENCOUNTER — Inpatient Hospital Stay: Payer: Medicare Other | Attending: Internal Medicine

## 2022-12-10 ENCOUNTER — Inpatient Hospital Stay (HOSPITAL_BASED_OUTPATIENT_CLINIC_OR_DEPARTMENT_OTHER): Payer: Medicare Other | Admitting: Physician Assistant

## 2022-12-10 VITALS — BP 140/73 | HR 97 | Temp 98.6°F | Resp 18 | Ht 66.0 in | Wt 212.4 lb

## 2022-12-10 DIAGNOSIS — R590 Localized enlarged lymph nodes: Secondary | ICD-10-CM | POA: Diagnosis not present

## 2022-12-10 DIAGNOSIS — J432 Centrilobular emphysema: Secondary | ICD-10-CM | POA: Diagnosis not present

## 2022-12-10 DIAGNOSIS — R0609 Other forms of dyspnea: Secondary | ICD-10-CM | POA: Insufficient documentation

## 2022-12-10 DIAGNOSIS — M47819 Spondylosis without myelopathy or radiculopathy, site unspecified: Secondary | ICD-10-CM | POA: Insufficient documentation

## 2022-12-10 DIAGNOSIS — C7931 Secondary malignant neoplasm of brain: Secondary | ICD-10-CM | POA: Insufficient documentation

## 2022-12-10 DIAGNOSIS — R0602 Shortness of breath: Secondary | ICD-10-CM | POA: Insufficient documentation

## 2022-12-10 DIAGNOSIS — C3490 Malignant neoplasm of unspecified part of unspecified bronchus or lung: Secondary | ICD-10-CM

## 2022-12-10 DIAGNOSIS — Z880 Allergy status to penicillin: Secondary | ICD-10-CM | POA: Insufficient documentation

## 2022-12-10 DIAGNOSIS — K439 Ventral hernia without obstruction or gangrene: Secondary | ICD-10-CM | POA: Insufficient documentation

## 2022-12-10 DIAGNOSIS — Z79899 Other long term (current) drug therapy: Secondary | ICD-10-CM | POA: Diagnosis not present

## 2022-12-10 DIAGNOSIS — F419 Anxiety disorder, unspecified: Secondary | ICD-10-CM | POA: Insufficient documentation

## 2022-12-10 DIAGNOSIS — Z7901 Long term (current) use of anticoagulants: Secondary | ICD-10-CM | POA: Diagnosis not present

## 2022-12-10 DIAGNOSIS — Z923 Personal history of irradiation: Secondary | ICD-10-CM | POA: Diagnosis not present

## 2022-12-10 DIAGNOSIS — Z5111 Encounter for antineoplastic chemotherapy: Secondary | ICD-10-CM | POA: Diagnosis not present

## 2022-12-10 DIAGNOSIS — M7989 Other specified soft tissue disorders: Secondary | ICD-10-CM

## 2022-12-10 DIAGNOSIS — C7932 Secondary malignant neoplasm of cerebral meninges: Secondary | ICD-10-CM

## 2022-12-10 DIAGNOSIS — Z888 Allergy status to other drugs, medicaments and biological substances status: Secondary | ICD-10-CM | POA: Insufficient documentation

## 2022-12-10 DIAGNOSIS — J4489 Other specified chronic obstructive pulmonary disease: Secondary | ICD-10-CM | POA: Insufficient documentation

## 2022-12-10 DIAGNOSIS — Z95828 Presence of other vascular implants and grafts: Secondary | ICD-10-CM

## 2022-12-10 DIAGNOSIS — Z5112 Encounter for antineoplastic immunotherapy: Secondary | ICD-10-CM | POA: Insufficient documentation

## 2022-12-10 DIAGNOSIS — C3412 Malignant neoplasm of upper lobe, left bronchus or lung: Secondary | ICD-10-CM | POA: Insufficient documentation

## 2022-12-10 DIAGNOSIS — N200 Calculus of kidney: Secondary | ICD-10-CM | POA: Insufficient documentation

## 2022-12-10 DIAGNOSIS — Z79620 Long term (current) use of immunosuppressive biologic: Secondary | ICD-10-CM | POA: Diagnosis not present

## 2022-12-10 DIAGNOSIS — Z79631 Long term (current) use of antimetabolite agent: Secondary | ICD-10-CM | POA: Diagnosis not present

## 2022-12-10 DIAGNOSIS — G4730 Sleep apnea, unspecified: Secondary | ICD-10-CM | POA: Insufficient documentation

## 2022-12-10 DIAGNOSIS — R5383 Other fatigue: Secondary | ICD-10-CM | POA: Diagnosis not present

## 2022-12-10 DIAGNOSIS — E041 Nontoxic single thyroid nodule: Secondary | ICD-10-CM | POA: Insufficient documentation

## 2022-12-10 DIAGNOSIS — J90 Pleural effusion, not elsewhere classified: Secondary | ICD-10-CM | POA: Diagnosis not present

## 2022-12-10 DIAGNOSIS — Z9049 Acquired absence of other specified parts of digestive tract: Secondary | ICD-10-CM | POA: Insufficient documentation

## 2022-12-10 LAB — CMP (CANCER CENTER ONLY)
ALT: 22 U/L (ref 0–44)
AST: 18 U/L (ref 15–41)
Albumin: 4.2 g/dL (ref 3.5–5.0)
Alkaline Phosphatase: 61 U/L (ref 38–126)
Anion gap: 9 (ref 5–15)
BUN: 10 mg/dL (ref 8–23)
CO2: 26 mmol/L (ref 22–32)
Calcium: 9.2 mg/dL (ref 8.9–10.3)
Chloride: 103 mmol/L (ref 98–111)
Creatinine: 0.67 mg/dL (ref 0.44–1.00)
GFR, Estimated: >60 mL/min (ref >60)
Glucose, Bld: 149 mg/dL — ABNORMAL HIGH (ref 70–99)
Potassium: 3.7 mmol/L (ref 3.5–5.1)
Sodium: 138 mmol/L (ref 135–145)
Total Bilirubin: 0.4 mg/dL (ref 0.3–1.2)
Total Protein: 6.6 g/dL (ref 6.5–8.1)

## 2022-12-10 LAB — CBC WITH DIFFERENTIAL (CANCER CENTER ONLY)
Abs Immature Granulocytes: 0.01 10*3/uL (ref 0.00–0.07)
Basophils Absolute: 0.1 10*3/uL (ref 0.0–0.1)
Basophils Relative: 2 %
Eosinophils Absolute: 0.1 10*3/uL (ref 0.0–0.5)
Eosinophils Relative: 2 %
HCT: 35.5 % — ABNORMAL LOW (ref 36.0–46.0)
Hemoglobin: 11.9 g/dL — ABNORMAL LOW (ref 12.0–15.0)
Immature Granulocytes: 0 %
Lymphocytes Relative: 16 %
Lymphs Abs: 0.5 10*3/uL — ABNORMAL LOW (ref 0.7–4.0)
MCH: 32.6 pg (ref 26.0–34.0)
MCHC: 33.5 g/dL (ref 30.0–36.0)
MCV: 97.3 fL (ref 80.0–100.0)
Monocytes Absolute: 0.4 10*3/uL (ref 0.1–1.0)
Monocytes Relative: 13 %
Neutro Abs: 2.2 10*3/uL (ref 1.7–7.7)
Neutrophils Relative %: 67 %
Platelet Count: 235 10*3/uL (ref 150–400)
RBC: 3.65 MIL/uL — ABNORMAL LOW (ref 3.87–5.11)
RDW: 16.4 % — ABNORMAL HIGH (ref 11.5–15.5)
WBC Count: 3.3 10*3/uL — ABNORMAL LOW (ref 4.0–10.5)
nRBC: 0 % (ref 0.0–0.2)

## 2022-12-10 MED ORDER — SODIUM CHLORIDE 0.9 % IV SOLN
10.0000 mg | Freq: Once | INTRAVENOUS | Status: AC
  Administered 2022-12-10: 10 mg via INTRAVENOUS
  Filled 2022-12-10: qty 10

## 2022-12-10 MED ORDER — SODIUM CHLORIDE 0.9 % IV SOLN: Freq: Once | INTRAVENOUS | Status: AC

## 2022-12-10 MED ORDER — SODIUM CHLORIDE 0.9 % IV SOLN
455.5000 mg | Freq: Once | INTRAVENOUS | Status: AC
  Administered 2022-12-10: 450 mg via INTRAVENOUS
  Filled 2022-12-10: qty 45

## 2022-12-10 MED ORDER — SODIUM CHLORIDE 0.9% FLUSH
10.0000 mL | Freq: Once | INTRAVENOUS | Status: AC
  Administered 2022-12-10: 10 mL

## 2022-12-10 MED ORDER — SODIUM CHLORIDE 0.9 % IV SOLN
500.0000 mg/m2 | Freq: Once | INTRAVENOUS | Status: AC
  Administered 2022-12-10: 1000 mg via INTRAVENOUS
  Filled 2022-12-10: qty 40

## 2022-12-10 MED ORDER — SODIUM CHLORIDE 0.9 % IV SOLN
150.0000 mg | Freq: Once | INTRAVENOUS | Status: DC
  Filled 2022-12-10: qty 5

## 2022-12-10 MED ORDER — SODIUM CHLORIDE 0.9% FLUSH
10.0000 mL | INTRAVENOUS | Status: DC | PRN
  Administered 2022-12-10: 10 mL

## 2022-12-10 MED ORDER — HEPARIN SOD (PORK) LOCK FLUSH 100 UNIT/ML IV SOLN: 500.0000 [IU] | Freq: Once | INTRAVENOUS | Status: DC | PRN

## 2022-12-10 MED ORDER — SODIUM CHLORIDE 0.9 % IV SOLN
150.0000 mg | Freq: Once | INTRAVENOUS | Status: AC
  Administered 2022-12-10: 150 mg via INTRAVENOUS
  Filled 2022-12-10: qty 150

## 2022-12-10 MED ORDER — SODIUM CHLORIDE 0.9 % IV SOLN
10.0000 mg | Freq: Once | INTRAVENOUS | Status: DC
  Filled 2022-12-10: qty 1

## 2022-12-10 MED ORDER — SODIUM CHLORIDE 0.9 % IV SOLN
200.0000 mg | Freq: Once | INTRAVENOUS | Status: AC
  Administered 2022-12-10: 200 mg via INTRAVENOUS
  Filled 2022-12-10: qty 8

## 2022-12-10 NOTE — Patient Instructions (Signed)
[facility]  Discharge Instructions: Thank you for choosing [facility] to provide your oncology and hematology care.   If you have a lab appointment with the Cancer Center, please go directly to the Cancer Center and check in at the registration area.   Wear comfortable clothing and clothing appropriate for easy access to any Portacath or PICC line.   We strive to give you quality time with your provider. You may need to reschedule your appointment if you arrive late (15 or more minutes).  Arriving late affects you and other patients whose appointments are after yours.  Also, if you miss three or more appointments without notifying the office, you may be dismissed from the clinic at the provider's discretion.      For prescription refill requests, have your pharmacy contact our office and allow 72 hours for refills to be completed.    Today you received the following chemotherapy and/or immunotherapy agents: Alimta/Carboplatin   To help prevent nausea and vomiting after your treatment, we encourage you to take your nausea medication as directed.  BELOW ARE SYMPTOMS THAT SHOULD BE REPORTED IMMEDIATELY: *FEVER GREATER THAN 100.4 F (38 C) OR HIGHER *CHILLS OR SWEATING *NAUSEA AND VOMITING THAT IS NOT CONTROLLED WITH YOUR NAUSEA MEDICATION *UNUSUAL SHORTNESS OF BREATH *UNUSUAL BRUISING OR BLEEDING *URINARY PROBLEMS (pain or burning when urinating, or frequent urination) *BOWEL PROBLEMS (unusual diarrhea, constipation, pain near the anus) TENDERNESS IN MOUTH AND THROAT WITH OR WITHOUT PRESENCE OF ULCERS (sore throat, sores in mouth, or a toothache) UNUSUAL RASH, SWELLING OR PAIN  UNUSUAL VAGINAL DISCHARGE OR ITCHING   Items with * indicate a potential emergency and should be followed up as soon as possible or go to the Emergency Department if any problems should occur.  Please show the CHEMOTHERAPY ALERT CARD or IMMUNOTHERAPY ALERT CARD at  check-in to the Emergency Department and triage nurse.  Should you have questions after your visit or need to cancel or reschedule your appointment, please contact [facility]  Dept: [phone]  and follow the prompts.  Office hours are 8:00 a.m. to 4:30 p.m. Monday - Friday. Please note that voicemails left after 4:00 p.m. may not be returned until the following business day.  We are closed weekends and major holidays. You have access to a nurse at all times for urgent questions. Please call the main number to the clinic Dept: [phone] and follow the prompts.   For any non-urgent questions, you may also contact your provider using MyChart. We now offer e-Visits for anyone 18 and older to request care online for non-urgent symptoms. For details visit [email or web address].   Also download the MyChart app! Go to the app store, search "MyChart", open the app, select [facility], and log in with your MyChart username and password.   

## 2022-12-10 NOTE — Patient Instructions (Signed)

## 2022-12-11 ENCOUNTER — Other Ambulatory Visit: Payer: Self-pay | Admitting: Physician Assistant

## 2022-12-11 ENCOUNTER — Telehealth: Payer: Self-pay

## 2022-12-11 ENCOUNTER — Ambulatory Visit (HOSPITAL_COMMUNITY)
Admission: RE | Admit: 2022-12-11 | Discharge: 2022-12-11 | Disposition: A | Discharge status: Home / Self Care | Payer: Medicare Other | Source: Ambulatory Visit | Attending: Physician Assistant | Admitting: Physician Assistant

## 2022-12-11 DIAGNOSIS — C3490 Malignant neoplasm of unspecified part of unspecified bronchus or lung: Secondary | ICD-10-CM | POA: Insufficient documentation

## 2022-12-11 DIAGNOSIS — M7989 Other specified soft tissue disorders: Secondary | ICD-10-CM | POA: Diagnosis present

## 2022-12-11 DIAGNOSIS — R600 Localized edema: Secondary | ICD-10-CM | POA: Diagnosis not present

## 2022-12-11 MED ORDER — APIXABAN (ELIQUIS) VTE STARTER PACK (10MG AND 5MG): ORAL_TABLET | ORAL | 0 refills | Status: DC

## 2022-12-11 NOTE — Progress Notes (Signed)
Right lower extremity venous duplex has been completed. Preliminary results can be found in CV Proc through chart review.  Results were given to Simpson PA.   12/11/22 10:15 AM Carlos Levering RVT

## 2022-12-11 NOTE — Telephone Encounter (Signed)
This nurse reached out to patient and made her aware that she has a chronic DVT and the provider will be starting her on Eliquis.  Made her aware to give the office a call when she is getting low so that maintenance dose can be called in for her.  Educated patient to not take NSAIDS, advised to stop taking Aspirin 81 mg.  Also educated on bleeding precautions.  She acknowledged understanding. No further questions or concerns noted at this time.

## 2022-12-18 ENCOUNTER — Inpatient Hospital Stay: Payer: Medicare Other

## 2022-12-18 ENCOUNTER — Telehealth: Payer: Self-pay

## 2022-12-18 DIAGNOSIS — Z95828 Presence of other vascular implants and grafts: Secondary | ICD-10-CM

## 2022-12-18 DIAGNOSIS — C3490 Malignant neoplasm of unspecified part of unspecified bronchus or lung: Secondary | ICD-10-CM

## 2022-12-18 DIAGNOSIS — Z5112 Encounter for antineoplastic immunotherapy: Secondary | ICD-10-CM | POA: Diagnosis not present

## 2022-12-18 LAB — CBC WITH DIFFERENTIAL (CANCER CENTER ONLY)
Abs Immature Granulocytes: 0.02 10*3/uL (ref 0.00–0.07)
Basophils Absolute: 0 10*3/uL (ref 0.0–0.1)
Basophils Relative: 1 %
Eosinophils Absolute: 0 10*3/uL (ref 0.0–0.5)
Eosinophils Relative: 2 %
HCT: 34.8 % — ABNORMAL LOW (ref 36.0–46.0)
Hemoglobin: 12 g/dL (ref 12.0–15.0)
Immature Granulocytes: 1 %
Lymphocytes Relative: 28 %
Lymphs Abs: 0.5 10*3/uL — ABNORMAL LOW (ref 0.7–4.0)
MCH: 33.3 pg (ref 26.0–34.0)
MCHC: 34.5 g/dL (ref 30.0–36.0)
MCV: 96.7 fL (ref 80.0–100.0)
Monocytes Absolute: 0.3 10*3/uL (ref 0.1–1.0)
Monocytes Relative: 18 %
Neutro Abs: 0.9 10*3/uL — ABNORMAL LOW (ref 1.7–7.7)
Neutrophils Relative %: 50 %
Platelet Count: 108 10*3/uL — ABNORMAL LOW (ref 150–400)
RBC: 3.6 MIL/uL — ABNORMAL LOW (ref 3.87–5.11)
RDW: 15.3 % (ref 11.5–15.5)
WBC Count: 1.8 10*3/uL — ABNORMAL LOW (ref 4.0–10.5)
nRBC: 0 % (ref 0.0–0.2)

## 2022-12-18 LAB — CMP (CANCER CENTER ONLY)
ALT: 17 U/L (ref 0–44)
AST: 16 U/L (ref 15–41)
Albumin: 4.1 g/dL (ref 3.5–5.0)
Alkaline Phosphatase: 65 U/L (ref 38–126)
Anion gap: 6 (ref 5–15)
BUN: 14 mg/dL (ref 8–23)
CO2: 28 mmol/L (ref 22–32)
Calcium: 9.2 mg/dL (ref 8.9–10.3)
Chloride: 104 mmol/L (ref 98–111)
Creatinine: 0.67 mg/dL (ref 0.44–1.00)
GFR, Estimated: >60 mL/min (ref >60)
Glucose, Bld: 128 mg/dL — ABNORMAL HIGH (ref 70–99)
Potassium: 4.1 mmol/L (ref 3.5–5.1)
Sodium: 138 mmol/L (ref 135–145)
Total Bilirubin: 0.4 mg/dL (ref 0.3–1.2)
Total Protein: 6.5 g/dL (ref 6.5–8.1)

## 2022-12-18 MED ORDER — HEPARIN SOD (PORK) LOCK FLUSH 100 UNIT/ML IV SOLN
500.0000 [IU] | Freq: Once | INTRAVENOUS | Status: AC
  Administered 2022-12-18: 500 [IU]

## 2022-12-18 MED ORDER — SODIUM CHLORIDE 0.9% FLUSH
10.0000 mL | Freq: Once | INTRAVENOUS | Status: AC
  Administered 2022-12-18: 10 mL

## 2022-12-18 NOTE — Telephone Encounter (Signed)
This nurse reached out to patient and made her aware of lab results.  Educated patient on neutropenic precautions. Denies fevers and chills.  Patient acknowledged understanding.

## 2022-12-25 ENCOUNTER — Inpatient Hospital Stay: Payer: Medicare Other

## 2022-12-25 ENCOUNTER — Other Ambulatory Visit: Payer: Self-pay

## 2022-12-25 DIAGNOSIS — Z95828 Presence of other vascular implants and grafts: Secondary | ICD-10-CM

## 2022-12-25 DIAGNOSIS — Z5112 Encounter for antineoplastic immunotherapy: Secondary | ICD-10-CM | POA: Diagnosis not present

## 2022-12-25 DIAGNOSIS — C3490 Malignant neoplasm of unspecified part of unspecified bronchus or lung: Secondary | ICD-10-CM

## 2022-12-25 LAB — CMP (CANCER CENTER ONLY)
ALT: 19 U/L (ref 0–44)
AST: 16 U/L (ref 15–41)
Albumin: 3.9 g/dL (ref 3.5–5.0)
Alkaline Phosphatase: 62 U/L (ref 38–126)
Anion gap: 6 (ref 5–15)
BUN: 10 mg/dL (ref 8–23)
CO2: 28 mmol/L (ref 22–32)
Calcium: 9 mg/dL (ref 8.9–10.3)
Chloride: 107 mmol/L (ref 98–111)
Creatinine: 0.55 mg/dL (ref 0.44–1.00)
GFR, Estimated: >60 mL/min (ref >60)
Glucose, Bld: 140 mg/dL — ABNORMAL HIGH (ref 70–99)
Potassium: 3.7 mmol/L (ref 3.5–5.1)
Sodium: 141 mmol/L (ref 135–145)
Total Bilirubin: 0.3 mg/dL (ref 0.3–1.2)
Total Protein: 5.9 g/dL — ABNORMAL LOW (ref 6.5–8.1)

## 2022-12-25 LAB — CBC WITH DIFFERENTIAL (CANCER CENTER ONLY)
Abs Immature Granulocytes: 0.01 10*3/uL (ref 0.00–0.07)
Basophils Absolute: 0 10*3/uL (ref 0.0–0.1)
Basophils Relative: 0 %
Eosinophils Absolute: 0 10*3/uL (ref 0.0–0.5)
Eosinophils Relative: 1 %
HCT: 31.9 % — ABNORMAL LOW (ref 36.0–46.0)
Hemoglobin: 10.6 g/dL — ABNORMAL LOW (ref 12.0–15.0)
Immature Granulocytes: 0 %
Lymphocytes Relative: 11 %
Lymphs Abs: 0.4 10*3/uL — ABNORMAL LOW (ref 0.7–4.0)
MCH: 33.1 pg (ref 26.0–34.0)
MCHC: 33.2 g/dL (ref 30.0–36.0)
MCV: 99.7 fL (ref 80.0–100.0)
Monocytes Absolute: 0.5 10*3/uL (ref 0.1–1.0)
Monocytes Relative: 13 %
Neutro Abs: 2.7 10*3/uL (ref 1.7–7.7)
Neutrophils Relative %: 75 %
Platelet Count: 139 10*3/uL — ABNORMAL LOW (ref 150–400)
RBC: 3.2 MIL/uL — ABNORMAL LOW (ref 3.87–5.11)
RDW: 16.4 % — ABNORMAL HIGH (ref 11.5–15.5)
WBC Count: 3.6 10*3/uL — ABNORMAL LOW (ref 4.0–10.5)
nRBC: 0 % (ref 0.0–0.2)

## 2022-12-25 MED ORDER — SODIUM CHLORIDE 0.9% FLUSH
10.0000 mL | Freq: Once | INTRAVENOUS | Status: AC
  Administered 2022-12-25: 10 mL

## 2022-12-25 MED ORDER — HEPARIN SOD (PORK) LOCK FLUSH 100 UNIT/ML IV SOLN
500.0000 [IU] | Freq: Once | INTRAVENOUS | Status: AC
  Administered 2022-12-25: 500 [IU]

## 2022-12-31 ENCOUNTER — Inpatient Hospital Stay: Payer: Medicare Other

## 2022-12-31 ENCOUNTER — Encounter: Payer: Self-pay | Admitting: Internal Medicine

## 2022-12-31 ENCOUNTER — Ambulatory Visit (HOSPITAL_COMMUNITY)
Admission: RE | Admit: 2022-12-31 | Discharge: 2022-12-31 | Disposition: A | Discharge status: Home / Self Care | Payer: Medicare Other | Source: Ambulatory Visit | Attending: Internal Medicine | Admitting: Internal Medicine

## 2022-12-31 ENCOUNTER — Encounter (HOSPITAL_COMMUNITY): Payer: Self-pay

## 2022-12-31 ENCOUNTER — Inpatient Hospital Stay (HOSPITAL_BASED_OUTPATIENT_CLINIC_OR_DEPARTMENT_OTHER): Payer: Medicare Other | Admitting: Internal Medicine

## 2022-12-31 VITALS — BP 156/58 | HR 95 | Temp 98.7°F | Resp 20

## 2022-12-31 VITALS — BP 156/58 | HR 95 | Temp 98.7°F | Resp 20 | Wt 215.6 lb

## 2022-12-31 DIAGNOSIS — C3490 Malignant neoplasm of unspecified part of unspecified bronchus or lung: Secondary | ICD-10-CM

## 2022-12-31 DIAGNOSIS — Z95828 Presence of other vascular implants and grafts: Secondary | ICD-10-CM

## 2022-12-31 LAB — CBC WITH DIFFERENTIAL (CANCER CENTER ONLY)
Abs Immature Granulocytes: 0.01 10*3/uL (ref 0.00–0.07)
Basophils Absolute: 0 10*3/uL (ref 0.0–0.1)
Basophils Relative: 1 %
Eosinophils Absolute: 0 10*3/uL (ref 0.0–0.5)
Eosinophils Relative: 1 %
HCT: 33.9 % — ABNORMAL LOW (ref 36.0–46.0)
Hemoglobin: 11.3 g/dL — ABNORMAL LOW (ref 12.0–15.0)
Immature Granulocytes: 0 %
Lymphocytes Relative: 15 %
Lymphs Abs: 0.5 10*3/uL — ABNORMAL LOW (ref 0.7–4.0)
MCH: 33.4 pg (ref 26.0–34.0)
MCHC: 33.3 g/dL (ref 30.0–36.0)
MCV: 100.3 fL — ABNORMAL HIGH (ref 80.0–100.0)
Monocytes Absolute: 0.5 10*3/uL (ref 0.1–1.0)
Monocytes Relative: 17 %
Neutro Abs: 1.9 10*3/uL (ref 1.7–7.7)
Neutrophils Relative %: 66 %
Platelet Count: 184 10*3/uL (ref 150–400)
RBC: 3.38 MIL/uL — ABNORMAL LOW (ref 3.87–5.11)
RDW: 16.9 % — ABNORMAL HIGH (ref 11.5–15.5)
WBC Count: 2.9 10*3/uL — ABNORMAL LOW (ref 4.0–10.5)
nRBC: 0 % (ref 0.0–0.2)

## 2022-12-31 LAB — CMP (CANCER CENTER ONLY)
ALT: 20 U/L (ref 0–44)
AST: 17 U/L (ref 15–41)
Albumin: 4.1 g/dL (ref 3.5–5.0)
Alkaline Phosphatase: 60 U/L (ref 38–126)
Anion gap: 7 (ref 5–15)
BUN: 12 mg/dL (ref 8–23)
CO2: 27 mmol/L (ref 22–32)
Calcium: 9.2 mg/dL (ref 8.9–10.3)
Chloride: 105 mmol/L (ref 98–111)
Creatinine: 0.61 mg/dL (ref 0.44–1.00)
GFR, Estimated: >60 mL/min (ref >60)
Glucose, Bld: 121 mg/dL — ABNORMAL HIGH (ref 70–99)
Potassium: 4.2 mmol/L (ref 3.5–5.1)
Sodium: 139 mmol/L (ref 135–145)
Total Bilirubin: 0.4 mg/dL (ref 0.3–1.2)
Total Protein: 6.2 g/dL — ABNORMAL LOW (ref 6.5–8.1)

## 2022-12-31 MED ORDER — ONDANSETRON HCL 4 MG/2ML IJ SOLN
8.0000 mg | Freq: Once | INTRAMUSCULAR | Status: AC
  Administered 2022-12-31: 8 mg via INTRAVENOUS
  Filled 2022-12-31: qty 4

## 2022-12-31 MED ORDER — HEPARIN SOD (PORK) LOCK FLUSH 100 UNIT/ML IV SOLN
500.0000 [IU] | Freq: Once | INTRAVENOUS | Status: AC
  Administered 2022-12-31: 500 [IU]

## 2022-12-31 MED ORDER — SODIUM CHLORIDE 0.9 % IV SOLN
500.0000 mg/m2 | Freq: Once | INTRAVENOUS | Status: AC
  Administered 2022-12-31: 1000 mg via INTRAVENOUS
  Filled 2022-12-31: qty 40

## 2022-12-31 MED ORDER — HEPARIN SOD (PORK) LOCK FLUSH 100 UNIT/ML IV SOLN
INTRAVENOUS | Status: AC
  Administered 2022-12-31: 500 [IU] via INTRAVENOUS
  Filled 2022-12-31: qty 5

## 2022-12-31 MED ORDER — HEPARIN SOD (PORK) LOCK FLUSH 100 UNIT/ML IV SOLN: 500.0000 [IU] | Freq: Once | INTRAVENOUS | Status: AC

## 2022-12-31 MED ORDER — HEPARIN SOD (PORK) LOCK FLUSH 100 UNIT/ML IV SOLN: 500.0000 [IU] | Freq: Once | INTRAVENOUS | Status: DC | PRN

## 2022-12-31 MED ORDER — SODIUM CHLORIDE 0.9% FLUSH
10.0000 mL | Freq: Once | INTRAVENOUS | Status: AC
  Administered 2022-12-31: 10 mL

## 2022-12-31 MED ORDER — SODIUM CHLORIDE (PF) 0.9 % IJ SOLN
INTRAMUSCULAR | Status: AC
  Filled 2022-12-31: qty 50

## 2022-12-31 MED ORDER — SODIUM CHLORIDE 0.9% FLUSH
10.0000 mL | INTRAVENOUS | Status: DC | PRN
  Administered 2022-12-31: 10 mL

## 2022-12-31 MED ORDER — PROCHLORPERAZINE MALEATE 10 MG PO TABS: 10.0000 mg | ORAL_TABLET | Freq: Once | ORAL | Status: DC

## 2022-12-31 MED ORDER — SODIUM CHLORIDE 0.9 % IV SOLN: Freq: Once | INTRAVENOUS | Status: AC

## 2022-12-31 MED ORDER — IOHEXOL 350 MG/ML SOLN
80.0000 mL | Freq: Once | INTRAVENOUS | Status: AC | PRN
  Administered 2022-12-31: 75 mL via INTRAVENOUS

## 2022-12-31 MED ORDER — SODIUM CHLORIDE 0.9 % IV SOLN
200.0000 mg | Freq: Once | INTRAVENOUS | Status: AC
  Administered 2022-12-31: 200 mg via INTRAVENOUS
  Filled 2022-12-31: qty 8

## 2022-12-31 NOTE — Progress Notes (Signed)
Per MD, ok to start treatment with CMP pending.

## 2022-12-31 NOTE — Progress Notes (Signed)
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   [name]  Detected Alteration(s) / Biomarker(s) Associated FDA-approved therapies Clinical Trial Availability % cfDNA or Amplification  KRAS G12C approved by FDA Adagrasib, Sotorasib Yes 1.7%  TP53 R249S None Yes 1.5%  PRIOR THERAPY: 1) SRS the the metastatic brain lesion under the care of Dr. Isidore Moos 2) Craniotomy on 07/10/22 under the care of Dr. Reatha Armour 3) radiation to the chest under care of Dr. Sondra Come.  Last dose on 08/23/2022   CURRENT THERAPY: Systemic chemotherapy with carboplatin for an AUC of 5, to 500 mg/m2, and Keytruda 200 mg IV every 3 weeks.  First dose expected on October 08, 2022.  Status post 4 cycles.  Starting from cycle #5 she will be on maintenance treatment with Alimta and Keytruda every 3 weeks.  INTERVAL HISTORY: [name] 82 y.o. female returns to the clinic today for follow-up visit accompanied by her husband.  The patient is feeling fine today with no concerning complaints except for chest pain and shortness of breath that started 2 days ago.  Her chest pain is on the right side.  It is more than a dull pain in that area.  She also cannot take deep breaths the last few days.  She denied having any fever or chills.  She has no nausea, vomiting, diarrhea or constipation.  She has no headache or visual changes.  She has no recent weight loss or night sweats.  She tolerated the last cycle of her treatment fairly well.  She is here today for evaluation before starting cycle #5.   MEDICAL HISTORY: Past Medical History:   Diagnosis Date   Anxiety    "had panic attacks years ago"   Asthma    COPD (chronic obstructive pulmonary disease) ([place])    Goiter 2022   History of hiatal hernia    "dx in college, never bothered me"   History of kidney stones 2012   History of radiation therapy    Left Lung-07/10/22-08/23/22- Dr. Gery Pray   Hypertension    Lung cancer [facility])    Sleep apnea     ALLERGIES:  is allergic to amoxicillin, compazine [prochlorperazine], norvasc [amlodipine], and trelegy ellipta [fluticasone-umeclidin-vilant].  MEDICATIONS:  Current Outpatient Medications  Medication Sig Dispense Refill   APIXABAN (ELIQUIS) VTE STARTER PACK (10MG  AND 5MG ) Take as directed on package: start with two-5mg  tablets twice daily for 7 days. On day 8, switch to one-5mg  tablet twice daily. 1 each 0   acetaminophen (TYLENOL) 500 MG tablet Take 500 mg by mouth every 6 (six) hours as needed for mild pain or headache.     albuterol (PROVENTIL) (2.5 MG/3ML) 0.083% nebulizer solution Take 3 mLs (2.5 mg total) by nebulization every 4 (four) hours as needed for wheezing or shortness of breath.     albuterol (VENTOLIN HFA) 108 (90 Base) MCG/ACT inhaler Inhale 2 puffs into the lungs every 6 (six) hours as needed for wheezing or shortness of breath. 18 g 1   ALPRAZolam (XANAX) 0.25 MG tablet Take 0.25 mg by mouth at bedtime as needed for anxiety.     Budeson-Glycopyrrol-Formoterol (BREZTRI  AEROSPHERE) 160-9-4.8 MCG/ACT AERO INHALE 2 PUFFS BY MOUTH EVERY MORNING AND EVERY NIGHT AT BEDTIME (Patient taking differently: Inhale 2 puffs into the lungs in the morning and at bedtime.) 0000000 g 5   folic acid (FOLVITE) 1 MG tablet Take 1 tablet (1 mg total) by mouth daily. 30 tablet 2   gabapentin (NEURONTIN) 100 MG capsule Take 1 capsule (100 mg total) by mouth 2 (two) times daily. 90 capsule 2   guaiFENesin (MUCINEX) 600 MG 12 hr tablet Take 1 tablet (600 mg total) by mouth 2 (two) times daily.     ipratropium-albuterol (DUONEB)  0.5-2.5 (3) MG/3ML SOLN Take 3 mLs by nebulization every 6 (six) hours as needed. (Patient not taking: Reported on 11/19/2022) 360 mL 5   levocetirizine (XYZAL) 5 MG tablet Take 5 mg by mouth at bedtime as needed (for seasonal allergies- if not taking generic Claritin).     LORazepam (ATIVAN) 1 MG tablet Take 1 tablet (1 mg total) by mouth as needed. 30 min Prior to MRI or brain radiation procedure (Patient not taking: Reported on 12/10/2022) 5 tablet 0   magic mouthwash (nystatin, diphenhydrAMINE, alum & mag hydroxide) suspension mixture Take 5 mLs by mouth 3 (three) times daily as needed for mouth pain. 240 mL 0   metFORMIN (GLUCOPHAGE-XR) 500 MG 24 hr tablet Take 500 mg by mouth every morning.     nystatin (MYCOSTATIN/NYSTOP) powder Apply 1 Application topically 3 (three) times daily. (Patient not taking: Reported on 12/10/2022) 30 g 0   ondansetron (ZOFRAN) 8 MG tablet Take 1 tablet (8 mg total) by mouth every 8 (eight) hours as needed for nausea or vomiting. (Patient not taking: Reported on 12/10/2022) 30 tablet 2   OVER THE COUNTER MEDICATION Apply 1 application  topically See admin instructions. Sonafine Wound Dressing Topical Emulsion- Apply to the back 1-2 times a day     pantoprazole (PROTONIX) 20 MG tablet Take 20 mg by mouth every evening.     telmisartan (MICARDIS) 20 MG tablet Take 1 tablet (20 mg total) by mouth daily. NEED OV. 15 tablet 0   traMADol (ULTRAM) 50 MG tablet Take 2 tablets (100 mg total) by mouth every 6 (six) hours as needed for severe pain. (Patient taking differently: Take 50 mg by mouth every 6 (six) hours as needed for severe pain.) 30 tablet 0   Zinc Acetate, Oral, (ZINC ACETATE PO) Take 1 tablet by mouth daily. Received from herbalist     No current facility-administered medications for this visit.    SURGICAL HISTORY:  Past Surgical History:  Procedure Laterality Date   APPENDECTOMY     APPLICATION OF CRANIAL NAVIGATION N/A 07/11/2022   Procedure: APPLICATION OF  CRANIAL NAVIGATION;  Surgeon: Dawley, Theodoro Doing, DO;  Location: [facility];  Service: Neurosurgery;  Laterality: N/A;   BRONCHIAL BIOPSY  06/12/2021   Procedure: BRONCHIAL BIOPSIES;  Surgeon: Garner Nash, DO;  Location: [facility];  Service: Pulmonary;;   BRONCHIAL BRUSHINGS  06/12/2021   Procedure: BRONCHIAL BRUSHINGS;  Surgeon: Garner Nash, DO;  Location: [facility];  Service: Pulmonary;;   BRONCHIAL WASHINGS  06/12/2021   Procedure: BRONCHIAL WASHINGS;  Surgeon: Garner Nash, DO;  Location: [facility];  Service: Pulmonary;;   CHOLECYSTECTOMY     COLON RESECTION     CRANIOTOMY N/A 07/11/2022   Procedure: Craniotomy for resection of tumor;  Surgeon: Karsten Ro, DO;  Location: [facility];  Service: Neurosurgery;  Laterality: N/A;  RM 19   FIDUCIAL MARKER PLACEMENT  06/12/2021   Procedure: FIDUCIAL MARKER PLACEMENT;  Surgeon: Garner Nash, DO;  Location: [facility];  Service: Pulmonary;;   hernia     x 2   IR IMAGING GUIDED PORT INSERTION  10/11/2022   TONSILLECTOMY     VIDEO BRONCHOSCOPY WITH ENDOBRONCHIAL NAVIGATION Bilateral 06/12/2021   Procedure: VIDEO BRONCHOSCOPY WITH ENDOBRONCHIAL NAVIGATION;  Surgeon: Garner Nash, DO;  Location: [facility];  Service: Pulmonary;  Laterality: Bilateral;  ION   VIDEO BRONCHOSCOPY WITH RADIAL ENDOBRONCHIAL ULTRASOUND  06/12/2021   Procedure: RADIAL ENDOBRONCHIAL ULTRASOUND;  Surgeon: Garner Nash, DO;  Location: [facility];  Service: Pulmonary;;    REVIEW OF SYSTEMS:  Constitutional: positive for fatigue Eyes: negative Ears, nose, mouth, throat, and face: negative Respiratory: positive for dyspnea on exertion and pleurisy/chest pain Cardiovascular: negative Gastrointestinal: negative Genitourinary:negative Integument/breast: positive for dryness Hematologic/lymphatic: negative Musculoskeletal:negative Neurological: negative Behavioral/Psych: negative Endocrine: negative Allergic/Immunologic: negative   PHYSICAL  EXAMINATION: General appearance: alert, cooperative, fatigued, and no distress Head: Normocephalic, without obvious abnormality, atraumatic Neck: no adenopathy, no JVD, supple, symmetrical, trachea midline, and thyroid not enlarged, symmetric, no tenderness/mass/nodules Lymph nodes: Cervical, supraclavicular, and axillary nodes normal. Resp: clear to auscultation bilaterally Back: symmetric, no curvature. ROM normal. No CVA tenderness. Cardio: regular rate and rhythm, S1, S2 normal, no murmur, click, rub or gallop GI: soft, non-tender; bowel sounds normal; no masses,  no organomegaly Extremities: extremities normal, atraumatic, no cyanosis or edema Neurologic: Alert and oriented X 3, normal strength and tone. Normal symmetric reflexes. Normal coordination and gait  ECOG PERFORMANCE STATUS: 1 - Symptomatic but completely ambulatory  Blood pressure (!) 156/58, pulse 95, temperature 98.7 F (37.1 C), temperature source Oral, resp. rate 20, weight 215 lb 9.6 oz (97.8 kg), SpO2 98 %.  LABORATORY DATA: Lab Results  Component Value Date   WBC 2.9 (L) 12/31/2022   HGB 11.3 (L) 12/31/2022   HCT 33.9 (L) 12/31/2022   MCV 100.3 (H) 12/31/2022   PLT 184 12/31/2022      Chemistry      Component Value Date/Time   NA 141 12/25/2022 1101   NA 139 09/17/2019 1550   K 3.7 12/25/2022 1101   CL 107 12/25/2022 1101   CO2 28 12/25/2022 1101   BUN 10 12/25/2022 1101   BUN 9 09/17/2019 1550   CREATININE 0.55 12/25/2022 1101      Component Value Date/Time   CALCIUM 9.0 12/25/2022 1101   ALKPHOS 62 12/25/2022 1101   AST 16 12/25/2022 1101   ALT 19 12/25/2022 1101   BILITOT 0.3 12/25/2022 1101       RADIOGRAPHIC STUDIES: VAS [place] LOWER EXTREMITY VENOUS (DVT)  Result Date: 12/11/2022  Lower Venous DVT Study Patient Name:  Laurie Mccoy  Date of Exam:   12/11/2022 Medical Rec #: [id number]          Accession #:    [id number] Date of Birth: May 06, 1941           Patient Gender: F Patient Age:   37  years Exam Location:  [facility] Procedure:      VAS [place] LOWER EXTREMITY VENOUS (DVT) Referring Phys: Roosevelt Locks --------------------------------------------------------------------------------  Indications: Swelling.  Risk Factors: Cancer. Limitations: Poor ultrasound/tissue interface. Comparison Study: No prior studies. Performing Technologist: Oliver Hum RVT  Examination Guidelines: A complete evaluation includes B-mode imaging, spectral Doppler, color Doppler, and power Doppler as needed of all accessible portions of each vessel. Bilateral testing is considered an integral part of a complete examination. Limited examinations for  reoccurring indications may be performed as noted. The reflux portion of the exam is performed with the patient in reverse Trendelenburg.  +---------+---------------+---------+-----------+----------+--------------+ RIGHT    CompressibilityPhasicitySpontaneityPropertiesThrombus Aging +---------+---------------+---------+-----------+----------+--------------+ CFV      Full           Yes      Yes                                 +---------+---------------+---------+-----------+----------+--------------+ SFJ      Full                                                        +---------+---------------+---------+-----------+----------+--------------+ FV Prox  Full                                                        +---------+---------------+---------+-----------+----------+--------------+ FV Mid   Full                                                        +---------+---------------+---------+-----------+----------+--------------+ FV DistalFull                                                        +---------+---------------+---------+-----------+----------+--------------+ PFV      Full                                                        +---------+---------------+---------+-----------+----------+--------------+  POP      Partial        Yes      Yes                  Chronic        +---------+---------------+---------+-----------+----------+--------------+ PTV      Full                                                        +---------+---------------+---------+-----------+----------+--------------+ PERO     Full                                                        +---------+---------------+---------+-----------+----------+--------------+ Gastroc  Full                                                        +---------+---------------+---------+-----------+----------+--------------+   +----+---------------+---------+-----------+----------+--------------+  LEFTCompressibilityPhasicitySpontaneityPropertiesThrombus Aging +----+---------------+---------+-----------+----------+--------------+ CFV Full           Yes      Yes                                 +----+---------------+---------+-----------+----------+--------------+    Summary: RIGHT: - Findings consistent with chronic deep vein thrombosis involving the right popliteal vein. - No cystic structure found in the popliteal fossa.  LEFT: - No evidence of common femoral vein obstruction.  *See table(s) above for measurements and observations. Electronically signed by Jamelle Haring on 12/11/2022 at 12:37:43 PM.    Final    CT Chest W Contrast  Result Date: 12/09/2022 CLINICAL DATA:  Metastatic lung cancer. History of non-small cell lung cancer. * Tracking Code: BO * EXAM: CT CHEST, ABDOMEN, AND PELVIS WITH CONTRAST TECHNIQUE: Multidetector CT imaging of the chest, abdomen and pelvis was performed following the standard protocol during bolus administration of intravenous contrast. RADIATION DOSE REDUCTION: This exam was performed according to the departmental dose-optimization program which includes automated exposure control, adjustment of the mA and/or kV according to patient size and/or use of iterative reconstruction technique.  CONTRAST:  155mL OMNIPAQUE IOHEXOL 300 MG/ML  SOLN COMPARISON:  Chest CT 09/06/2022 and 07/04/2022.  PET-CT 05/02/2022. FINDINGS: CT CHEST FINDINGS Cardiovascular: No acute vascular findings. Right IJ Port-A-Cath extends to the superior cavoatrial junction. There is atherosclerosis of the aorta, great vessels and coronary arteries. The heart size is normal. There is no pericardial effusion. Mediastinum/Nodes: There are no enlarged mediastinal, hilar or axillary lymph nodes. 2.3 x 1.6 cm left thyroid nodule on image 9/2 is similar to prior studies and was not hypermetabolic on PET-CT. The esophagus and trachea appear unremarkable. Lungs/Pleura: Stable small left pleural effusion. No right pleural effusion or pneumothorax. The previously demonstrated left upper lobe perihilar nodule measures approximately 1.9 x 1.1 cm (previously 2.0 x 1.3 cm). Patchy ground-glass opacities are again noted throughout the left upper lobe, similar in distribution to the prior study. There are increased central solid components which appear somewhat band like on the reformatted images, likely related to interval radiation therapy. The part solid right upper lobe nodule and associated fiducial markers are unchanged, measuring up to 1.2 x 1.1 cm on image 57/8. Mild centrilobular and paraseptal emphysema. Musculoskeletal/Chest wall: No chest wall mass or suspicious osseous findings. CT ABDOMEN AND PELVIS FINDINGS Hepatobiliary: The liver is normal in density without suspicious focal abnormality. No evidence of biliary dilatation status post cholecystectomy. Pancreas: Unremarkable. No pancreatic ductal dilatation or surrounding inflammatory changes. Spleen: Normal in size without focal abnormality. Adrenals/Urinary Tract: Both adrenal glands appear normal. Punctate nonobstructing bilateral renal calculi. No evidence of ureteral calculus or hydronephrosis. Stable small cyst in the anterior interpolar region of the left kidney for which no  follow-up imaging is recommended. The bladder appears normal for its degree of distention. Stomach/Bowel: No enteric contrast administered. The stomach appears unremarkable for its degree of distention. No bowel distension, wall thickening or surrounding inflammation. Chronic extension of a portion of the transverse colon into a ventral abdominal wall hernia is unchanged. No evidence of incarceration or obstruction. There is a patent distal colonic anastomosis. Vascular/Lymphatic: There are no enlarged abdominal or pelvic lymph nodes. Diffuse aortic and branch vessel atherosclerosis without evidence of aneurysm or large vessel occlusion. Reproductive: Unchanged fluid density centrally in the uterus without hypermetabolic activity on prior PET-CT. This measures up to 2.8 cm in length on sagittal  image 61/7 and could reflect fluid in a dilated endometrial cavity. No adnexal mass. Other: Stable hernias of the anterior abdominal wall containing fat and a portion of the transverse colon. No evidence of bowel incarceration or obstruction. No ascites or peritoneal nodularity. Unchanged subcutaneous soft tissue nodule medially in the left buttocks measuring 3.1 cm on image 59/4. No associated metabolic activity on prior PET-CT. Musculoskeletal: No acute or significant osseous findings. Mild lumbar facet hypertrophy. IMPRESSION: 1. The treated left upper lobe perihilar nodule is slightly smaller than on the most recent prior study, consistent with positive treatment response. 2. Increased central solid components within the left upper lobe ground-glass opacities, likely related to interval radiation therapy. Recommend attention on follow-up. 3. The part solid right upper lobe pulmonary nodule is unchanged. 4. No evidence of metastatic disease in the abdomen or pelvis. 5. Stable fluid density centrally in the uterus which could reflect fluid in a dilated endometrial cavity or a degenerated fibroid. Consider further evaluation  with pelvic ultrasound. 6. Stable ventral abdominal wall hernias containing fat and a portion of the transverse colon. No evidence of bowel incarceration or obstruction. 7. Stable left thyroid nodule which was not hypermetabolic on prior PET-CT. Recommend thyroid ultrasound if not previously performed.(Ref: J Am Coll Radiol. 2015 Feb;12(2): 143-50). 8. Aortic Atherosclerosis (ICD10-I70.0) and Emphysema (ICD10-J43.9). Electronically Signed   By: Richardean Sale M.D.   On: 12/09/2022 15:31   CT Abdomen Pelvis W Contrast  Result Date: 12/09/2022 CLINICAL DATA:  Metastatic lung cancer. History of non-small cell lung cancer. * Tracking Code: BO * EXAM: CT CHEST, ABDOMEN, AND PELVIS WITH CONTRAST TECHNIQUE: Multidetector CT imaging of the chest, abdomen and pelvis was performed following the standard protocol during bolus administration of intravenous contrast. RADIATION DOSE REDUCTION: This exam was performed according to the departmental dose-optimization program which includes automated exposure control, adjustment of the mA and/or kV according to patient size and/or use of iterative reconstruction technique. CONTRAST:  173mL OMNIPAQUE IOHEXOL 300 MG/ML  SOLN COMPARISON:  Chest CT 09/06/2022 and 07/04/2022.  PET-CT 05/02/2022. FINDINGS: CT CHEST FINDINGS Cardiovascular: No acute vascular findings. Right IJ Port-A-Cath extends to the superior cavoatrial junction. There is atherosclerosis of the aorta, great vessels and coronary arteries. The heart size is normal. There is no pericardial effusion. Mediastinum/Nodes: There are no enlarged mediastinal, hilar or axillary lymph nodes. 2.3 x 1.6 cm left thyroid nodule on image 9/2 is similar to prior studies and was not hypermetabolic on PET-CT. The esophagus and trachea appear unremarkable. Lungs/Pleura: Stable small left pleural effusion. No right pleural effusion or pneumothorax. The previously demonstrated left upper lobe perihilar nodule measures approximately 1.9 x  1.1 cm (previously 2.0 x 1.3 cm). Patchy ground-glass opacities are again noted throughout the left upper lobe, similar in distribution to the prior study. There are increased central solid components which appear somewhat band like on the reformatted images, likely related to interval radiation therapy. The part solid right upper lobe nodule and associated fiducial markers are unchanged, measuring up to 1.2 x 1.1 cm on image 57/8. Mild centrilobular and paraseptal emphysema. Musculoskeletal/Chest wall: No chest wall mass or suspicious osseous findings. CT ABDOMEN AND PELVIS FINDINGS Hepatobiliary: The liver is normal in density without suspicious focal abnormality. No evidence of biliary dilatation status post cholecystectomy. Pancreas: Unremarkable. No pancreatic ductal dilatation or surrounding inflammatory changes. Spleen: Normal in size without focal abnormality. Adrenals/Urinary Tract: Both adrenal glands appear normal. Punctate nonobstructing bilateral renal calculi. No evidence of ureteral calculus or hydronephrosis. Stable  small cyst in the anterior interpolar region of the left kidney for which no follow-up imaging is recommended. The bladder appears normal for its degree of distention. Stomach/Bowel: No enteric contrast administered. The stomach appears unremarkable for its degree of distention. No bowel distension, wall thickening or surrounding inflammation. Chronic extension of a portion of the transverse colon into a ventral abdominal wall hernia is unchanged. No evidence of incarceration or obstruction. There is a patent distal colonic anastomosis. Vascular/Lymphatic: There are no enlarged abdominal or pelvic lymph nodes. Diffuse aortic and branch vessel atherosclerosis without evidence of aneurysm or large vessel occlusion. Reproductive: Unchanged fluid density centrally in the uterus without hypermetabolic activity on prior PET-CT. This measures up to 2.8 cm in length on sagittal image 61/7 and  could reflect fluid in a dilated endometrial cavity. No adnexal mass. Other: Stable hernias of the anterior abdominal wall containing fat and a portion of the transverse colon. No evidence of bowel incarceration or obstruction. No ascites or peritoneal nodularity. Unchanged subcutaneous soft tissue nodule medially in the left buttocks measuring 3.1 cm on image 59/4. No associated metabolic activity on prior PET-CT. Musculoskeletal: No acute or significant osseous findings. Mild lumbar facet hypertrophy. IMPRESSION: 1. The treated left upper lobe perihilar nodule is slightly smaller than on the most recent prior study, consistent with positive treatment response. 2. Increased central solid components within the left upper lobe ground-glass opacities, likely related to interval radiation therapy. Recommend attention on follow-up. 3. The part solid right upper lobe pulmonary nodule is unchanged. 4. No evidence of metastatic disease in the abdomen or pelvis. 5. Stable fluid density centrally in the uterus which could reflect fluid in a dilated endometrial cavity or a degenerated fibroid. Consider further evaluation with pelvic ultrasound. 6. Stable ventral abdominal wall hernias containing fat and a portion of the transverse colon. No evidence of bowel incarceration or obstruction. 7. Stable left thyroid nodule which was not hypermetabolic on prior PET-CT. Recommend thyroid ultrasound if not previously performed.(Ref: J Am Coll Radiol. 2015 Feb;12(2): 143-50). 8. Aortic Atherosclerosis (ICD10-I70.0) and Emphysema (ICD10-J43.9). Electronically Signed   By: Richardean Sale M.D.   On: 12/09/2022 15:31    ASSESSMENT AND PLAN: This is a very pleasant 82 years old white female with likely stage IV (T2a, N3, M1B) non-small cell lung cancer, adenocarcinoma presented with left upper lobe perihilar mass in addition to left hilar and mediastinal lymphadenopathy as well as left supraclavicular lymphadenopathy and suspicious  solitary brain metastasis diagnosed in September 2023. The molecular studies showed positive KRAS G12C mutation The patient underwent SRS followed by craniotomy on July 10, 2022 to the solitary brain metastasis.  She also underwent palliative radiotherapy to the left upper lobe perihilar mass and mediastinal lymphadenopathy under the care of Dr. Sondra Come. The patient is currently on systemic chemotherapy with carboplatin for AUC of 5, Alimta 500 Mg/M2 and Keytruda 200 Mg IV every 3 weeks.  First dose October 08, 2022.  Status post 4 cycles.  Starting from cycle #5 she will be on maintenance treatment with Alimta and Keytruda every 3 weeks. The patient tolerated the last cycle of her treatment fairly well. I recommended for her to proceed with cycle #5 today as planned. For the new right-sided chest pain and shortness of breath, I will order CT angiogram of the chest to rule out any pulmonary embolism.  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  If she has any evidence for pulmonary embolism, we will switch her treatment  to Lovenox twice daily. She will come back for follow-up visit in 3 weeks for evaluation before starting cycle #6. The patient was advised to call immediately if she has any concerning symptoms in the interval. The patient voices understanding of current disease status and treatment options and is in agreement with the current care plan.  All questions were answered. The patient knows to call the clinic with any problems, questions or concerns. We can certainly see the patient much sooner if necessary.  The total time spent in the appointment was 30 minutes.  Disclaimer: This note was dictated with voice recognition software. Similar sounding words can inadvertently be transcribed and may not be corrected upon review.

## 2022-12-31 NOTE — Progress Notes (Signed)
Patient seen by MD today  Vitals are within treatment parameters.  Labs reviewed: and are within treatment parameters.  Per physician team, patient is ready for treatment and there are NO modifications to the treatment plan.  

## 2023-01-01 ENCOUNTER — Encounter: Payer: Self-pay | Admitting: Internal Medicine

## 2023-01-01 ENCOUNTER — Ambulatory Visit (HOSPITAL_COMMUNITY): Payer: Medicare Other

## 2023-01-01 ENCOUNTER — Inpatient Hospital Stay: Payer: Medicare Other

## 2023-01-02 ENCOUNTER — Encounter (HOSPITAL_COMMUNITY): Payer: Self-pay

## 2023-01-02 ENCOUNTER — Telehealth: Payer: Self-pay | Admitting: Medical Oncology

## 2023-01-02 ENCOUNTER — Other Ambulatory Visit: Payer: Self-pay

## 2023-01-02 ENCOUNTER — Emergency Department (HOSPITAL_COMMUNITY)
Admission: EM | Admit: 2023-01-02 | Discharge: 2023-01-02 | Disposition: A | Discharge status: Home / Self Care | Payer: Medicare Other | Attending: Emergency Medicine | Admitting: Emergency Medicine

## 2023-01-02 ENCOUNTER — Emergency Department (HOSPITAL_COMMUNITY): Payer: Medicare Other

## 2023-01-02 DIAGNOSIS — Z1152 Encounter for screening for COVID-19: Secondary | ICD-10-CM | POA: Insufficient documentation

## 2023-01-02 DIAGNOSIS — L0390 Cellulitis, unspecified: Secondary | ICD-10-CM

## 2023-01-02 DIAGNOSIS — B9789 Other viral agents as the cause of diseases classified elsewhere: Secondary | ICD-10-CM | POA: Insufficient documentation

## 2023-01-02 DIAGNOSIS — C3490 Malignant neoplasm of unspecified part of unspecified bronchus or lung: Secondary | ICD-10-CM | POA: Insufficient documentation

## 2023-01-02 DIAGNOSIS — R509 Fever, unspecified: Secondary | ICD-10-CM | POA: Insufficient documentation

## 2023-01-02 DIAGNOSIS — R0602 Shortness of breath: Secondary | ICD-10-CM | POA: Insufficient documentation

## 2023-01-02 DIAGNOSIS — J441 Chronic obstructive pulmonary disease with (acute) exacerbation: Secondary | ICD-10-CM | POA: Insufficient documentation

## 2023-01-02 DIAGNOSIS — B970 Adenovirus as the cause of diseases classified elsewhere: Secondary | ICD-10-CM | POA: Insufficient documentation

## 2023-01-02 LAB — CBC WITH DIFFERENTIAL/PLATELET
Abs Immature Granulocytes: 0.02 10*3/uL (ref 0.00–0.07)
Basophils Absolute: 0 10*3/uL (ref 0.0–0.1)
Basophils Relative: 1 %
Eosinophils Absolute: 0 10*3/uL (ref 0.0–0.5)
Eosinophils Relative: 1 %
HCT: 33.2 % — ABNORMAL LOW (ref 36.0–46.0)
Hemoglobin: 10.8 g/dL — ABNORMAL LOW (ref 12.0–15.0)
Immature Granulocytes: 1 %
Lymphocytes Relative: 11 %
Lymphs Abs: 0.4 10*3/uL — ABNORMAL LOW (ref 0.7–4.0)
MCH: 33.3 pg (ref 26.0–34.0)
MCHC: 32.5 g/dL (ref 30.0–36.0)
MCV: 102.5 fL — ABNORMAL HIGH (ref 80.0–100.0)
Monocytes Absolute: 0.2 10*3/uL (ref 0.1–1.0)
Monocytes Relative: 7 %
Neutro Abs: 2.8 10*3/uL (ref 1.7–7.7)
Neutrophils Relative %: 79 %
Platelets: 171 10*3/uL (ref 150–400)
RBC: 3.24 MIL/uL — ABNORMAL LOW (ref 3.87–5.11)
RDW: 16.6 % — ABNORMAL HIGH (ref 11.5–15.5)
WBC: 3.4 10*3/uL — ABNORMAL LOW (ref 4.0–10.5)
nRBC: 0 % (ref 0.0–0.2)

## 2023-01-02 LAB — BASIC METABOLIC PANEL
Anion gap: 7 (ref 5–15)
BUN: 12 mg/dL (ref 8–23)
CO2: 25 mmol/L (ref 22–32)
Calcium: 8.6 mg/dL — ABNORMAL LOW (ref 8.9–10.3)
Chloride: 104 mmol/L (ref 98–111)
Creatinine, Ser: 0.63 mg/dL (ref 0.44–1.00)
GFR, Estimated: >60 mL/min (ref >60)
Glucose, Bld: 123 mg/dL — ABNORMAL HIGH (ref 70–99)
Potassium: 4 mmol/L (ref 3.5–5.1)
Sodium: 136 mmol/L (ref 135–145)

## 2023-01-02 LAB — SARS CORONAVIRUS 2 BY RT PCR: SARS Coronavirus 2 by RT PCR: NEGATIVE

## 2023-01-02 MED ORDER — IPRATROPIUM-ALBUTEROL 0.5-2.5 (3) MG/3ML IN SOLN
3.0000 mL | RESPIRATORY_TRACT | Status: DC | PRN
  Administered 2023-01-02: 3 mL via RESPIRATORY_TRACT
  Filled 2023-01-02: qty 3

## 2023-01-02 MED ORDER — PREDNISONE 50 MG PO TABS: 50.0000 mg | ORAL_TABLET | Freq: Every day | ORAL | 0 refills | Status: AC

## 2023-01-02 MED ORDER — IPRATROPIUM-ALBUTEROL 0.5-2.5 (3) MG/3ML IN SOLN: 3.0000 mL | Freq: Four times a day (QID) | RESPIRATORY_TRACT | 0 refills | Status: DC | PRN

## 2023-01-02 MED ORDER — HEPARIN SOD (PORK) LOCK FLUSH 100 UNIT/ML IV SOLN
500.0000 [IU] | Freq: Once | INTRAVENOUS | Status: AC
  Administered 2023-01-02: 500 [IU]
  Filled 2023-01-02: qty 5

## 2023-01-02 MED ORDER — CEPHALEXIN 500 MG PO CAPS: 500.0000 mg | ORAL_CAPSULE | Freq: Two times a day (BID) | ORAL | 0 refills | Status: AC

## 2023-01-02 MED ORDER — METHYLPREDNISOLONE SODIUM SUCC 125 MG IJ SOLR
125.0000 mg | Freq: Once | INTRAMUSCULAR | Status: AC
  Administered 2023-01-02: 125 mg via INTRAVENOUS
  Filled 2023-01-02: qty 2

## 2023-01-02 NOTE — ED Provider Notes (Signed)
[facility] Provider Note   CSN: [id number] Arrival date & time: 01/02/23  1806     History Chief Complaint  Patient presents with   Shortness of Breath    HPI Laurie Mccoy is a 82 y.o. female presenting for chief complaint of shortness of breath.  Denies nausea vomiting, syncope shortness of breath otherwise ambulatory tolerating p.o. intake. 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 States that she has had 3 days of worsening shortness of breath.  Alerted her oncologist who ordered a PET scan which was performed yesterday and nondiagnostic.  She continued to worsen and had to use her home oxygen at 4 L.  She does not normally use oxygen.  She had a leftover oxygen from a pneumonia encounter earlier this year. Patient's recorded medical, surgical, social, medication list and allergies were reviewed in the Snapshot window as part of the initial history.   Review of Systems   Review of Systems  Constitutional:  Positive for fever. Negative for chills.  HENT:  Negative for ear pain and sore throat.   Eyes:  Negative for pain and visual disturbance.  Respiratory:  Positive for cough and shortness of breath.   Cardiovascular:  Negative for chest pain and palpitations.  Gastrointestinal:  Negative for abdominal pain and vomiting.  Genitourinary:  Negative for dysuria and hematuria.  Musculoskeletal:  Negative for arthralgias and back pain.  Skin:  Negative for color change and rash.  Neurological:  Negative for seizures and syncope.  All other systems reviewed and are negative.   Physical Exam Updated Vital Signs BP (!) 131/57   Pulse 96   Temp 98.4 F (36.9 C) (Oral)   Resp 18   Ht 5\' 6"  (1.676 m)   Wt 98 kg   SpO2 93%   BMI 34.87 kg/m  Physical Exam Vitals and nursing note reviewed.  Constitutional:      General: She is not in acute distress.    Appearance: She is well-developed.  HENT:     Head:  Normocephalic and atraumatic.  Eyes:     Conjunctiva/sclera: Conjunctivae normal.  Cardiovascular:     Rate and Rhythm: Normal rate and regular rhythm.     Heart sounds: No murmur heard. Pulmonary:     Effort: Pulmonary effort is normal. Tachypnea present. No respiratory distress.     Breath sounds: Normal breath sounds.  Abdominal:     General: There is no distension.     Palpations: Abdomen is soft.     Tenderness: There is no abdominal tenderness. There is no right CVA tenderness or left CVA tenderness.  Musculoskeletal:        General: No swelling or tenderness. Normal range of motion.     Cervical back: Neck supple.  Skin:    General: Skin is warm and dry.  Neurological:     General: No focal deficit present.     Mental Status: She is alert and oriented to person, place, and time. Mental status is at baseline.     Cranial Nerves: No cranial nerve deficit.      ED Course/ Medical Decision Making/ A&P Clinical Course as of 01/02/23 2045  Thu Jan 02, 2023  1949 [facility]: 33.3 [CC]    Clinical Course User Index [CC] Tretha Sciara, MD    Procedures Procedures   Medications Ordered in ED Medications  ipratropium-albuterol (DUONEB) 0.5-2.5 (3) MG/3ML nebulizer solution 3 mL (3 mLs Nebulization Given 01/02/23 2011)  methylPREDNISolone sodium  succinate (SOLU-MEDROL) 125 mg/2 mL injection 125 mg (has no administration in time range)    Medical Decision Making:    Laurie Mccoy is a 82 y.o. female who presented to the ED today with shortness of breath detailed above.     Patient's presentation is complicated by their history of multiple comorbid medical problems .  Patient placed on continuous vitals and telemetry monitoring while in ED which was reviewed periodically.   Complete initial physical exam performed, notably the patient  was hemodynamically stable in no acute distress.  Currently ambulatory tolerating p.o. intake.  Mildly dyspneic, mild tachypnea.    Reviewed and confirmed nursing documentation for past medical history, family history, social history.    Initial Assessment:   With the patient's presentation of fever cough and shortness of breath, most likely diagnosis is nonspecific etiology such as viral pneumonia versus bacterial pneumonia possibly with comorbid COPD exacerbation given her history. Other diagnoses were considered including (but not limited to) ACS, pulmonary embolism, pneumothorax. These are considered less likely due to history of present illness and physical exam findings.   This is most consistent with an acute life/limb threatening illness complicated by underlying chronic conditions.  Initial Plan:  Screening labs including CBC and Metabolic panel to evaluate for infectious or metabolic etiology of disease.  CXR to evaluate for structural/infectious intrathoracic pathology.  EKG to evaluate for cardiac pathology. Viral screening studies Objective evaluation as below reviewed with plan for close reassessment  Initial Study Results:   Laboratory  All laboratory results reviewed without evidence of clinically relevant pathology.     EKG EKG was reviewed independently. Rate, rhythm, axis, intervals all examined and without medically relevant abnormality. ST segments without concerns for elevations.    Radiology  All images reviewed independently. Agree with radiology report at this time.   DG Chest Portable 1 View  Result Date: 01/02/2023 CLINICAL DATA:  SOB EXAM: PORTABLE CHEST 1 VIEW COMPARISON:  Chest x-ray 09/06/2022, CT angiography chest 12/31/2022. FINDINGS: Right chest wall Port-A-Cath with tip overlying the superior cavoatrial junction. The heart and mediastinal contours are unchanged. Aortic calcification. Persistent but decreased left upper lobe airspace opacity. No pulmonary edema. No pleural effusion. No pneumothorax. No acute osseous abnormality. IMPRESSION: 1. Left upper lung zone airspace opacity  better evaluated on CT angiography chest 12/31/2022. 2.  Aortic Atherosclerosis (ICD10-I70.0). Electronically Signed   By: Iven Finn M.D.   On: 01/02/2023 18:43     Final Assessment and Plan:   Patient's history present illness and physical exam findings do not reveal any acute pathology.  She was tachypneic initially on presentation treated with DuoNeb due to her history of COPD.  She did have significant symptomatic improvement.  Viral path panel was ordered given her multiple comorbid medical problems.  I recommended admission to the hospital for her COPD exacerbation with dyspnea, however patient declined.  States that she has oxygen, breathing treatments at home and if at all possible would prefer to be discharged.  Given normal oxygen saturations on room air after treatment, this is reasonable.  Will place patient on a steroid burst for the next 5 days and have her follow-up with her PCP in the outpatient setting.  Strict return precautions regarding interval worsening reinforced and patient expressed understanding. Given negative CTA PE scan yesterday and gross symmetry of her presentation yesterday compared to today, I believe that repeat CT scan is unlikely be beneficial working up for PE.   Disposition:  I have considered need for  hospitalization, however, considering all of the above, I believe this patient is stable for discharge at this time.  Patient/family educated about specific return precautions for given chief complaint and symptoms.  Patient/family educated about follow-up with PCP.     Patient/family expressed understanding of return precautions and need for follow-up. Patient spoken to regarding all imaging and laboratory results and appropriate follow up for these results. All education provided in verbal form with additional information in written form. Time was allowed for answering of patient questions. Patient discharged.    Emergency Department Medication Summary:    Medications  ipratropium-albuterol (DUONEB) 0.5-2.5 (3) MG/3ML nebulizer solution 3 mL (3 mLs Nebulization Given 01/02/23 2011)  methylPREDNISolone sodium succinate (SOLU-MEDROL) 125 mg/2 mL injection 125 mg (has no administration in time range)         Clinical Impression:  1. COPD exacerbation ([place])      Data Unavailable   Final Clinical Impression(s) / ED Diagnoses Final diagnoses:  COPD exacerbation ([place])    Rx / DC Orders ED Discharge Orders          Ordered    ipratropium-albuterol (DUONEB) 0.5-2.5 (3) MG/3ML SOLN  Every 6 hours PRN        01/02/23 2032              Tretha Sciara, MD 01/02/23 2048

## 2023-01-02 NOTE — ED Notes (Signed)
RN called lab, they will add on Respiratory panel.  Pt states she feel better but not sure how she will do walking around.

## 2023-01-02 NOTE — ED Triage Notes (Signed)
Patient has been feeling short of breath for 2 days. Has stage 4 lung cancer. Oxygen at home was 84% room air. Stated she thinks she has a low grade fever.

## 2023-01-02 NOTE — ED Notes (Signed)
RN deaccessed port, pt states she feel much better.  Pt was able to ambulate to the wheelchair w/o any issues.

## 2023-01-02 NOTE — Telephone Encounter (Signed)
Flu like symptoms-muscle aches, temp =99.8, chills, runny nose . I told pt she needs to do a COVID test at home and call results. I called pt back and she does not have a COVID test at home . He husband went to get one. I told her she may also need to go to Urgent care for flu test.   Dyspnea-The longer I talked to [facility] she was more SOB, and audibly wheezing and stopping between her words to get her breath.   Oxygen sats at home - Room air 88%. With 2 liter of oxygen =93%. She does not normally use oxygen. CT angio yesterday was neg for a PE.   I instructed her to go to ED now. She said her husband will take her.

## 2023-01-03 LAB — RESPIRATORY PANEL BY PCR
Adenovirus: DETECTED — AB
Bordetella Parapertussis: NOT DETECTED
Bordetella pertussis: NOT DETECTED
Chlamydophila pneumoniae: NOT DETECTED
Coronavirus 229E: NOT DETECTED
Coronavirus HKU1: NOT DETECTED
Coronavirus NL63: NOT DETECTED
Coronavirus OC43: NOT DETECTED
Influenza A: NOT DETECTED
Influenza B: NOT DETECTED
Metapneumovirus: NOT DETECTED
Mycoplasma pneumoniae: NOT DETECTED
Parainfluenza Virus 1: NOT DETECTED
Parainfluenza Virus 2: NOT DETECTED
Parainfluenza Virus 3: DETECTED — AB
Parainfluenza Virus 4: NOT DETECTED
Respiratory Syncytial Virus: NOT DETECTED
Rhinovirus / Enterovirus: NOT DETECTED

## 2023-01-06 ENCOUNTER — Other Ambulatory Visit (HOSPITAL_COMMUNITY): Payer: Medicare Other

## 2023-01-07 ENCOUNTER — Other Ambulatory Visit: Payer: Self-pay

## 2023-01-08 ENCOUNTER — Inpatient Hospital Stay: Payer: Medicare Other | Attending: Internal Medicine

## 2023-01-08 DIAGNOSIS — Z823 Family history of stroke: Secondary | ICD-10-CM | POA: Insufficient documentation

## 2023-01-08 DIAGNOSIS — Z801 Family history of malignant neoplasm of trachea, bronchus and lung: Secondary | ICD-10-CM | POA: Insufficient documentation

## 2023-01-08 DIAGNOSIS — Z923 Personal history of irradiation: Secondary | ICD-10-CM | POA: Insufficient documentation

## 2023-01-08 DIAGNOSIS — C3412 Malignant neoplasm of upper lobe, left bronchus or lung: Secondary | ICD-10-CM | POA: Insufficient documentation

## 2023-01-08 DIAGNOSIS — Z880 Allergy status to penicillin: Secondary | ICD-10-CM | POA: Diagnosis not present

## 2023-01-08 DIAGNOSIS — Z87891 Personal history of nicotine dependence: Secondary | ICD-10-CM | POA: Diagnosis not present

## 2023-01-08 DIAGNOSIS — J4489 Other specified chronic obstructive pulmonary disease: Secondary | ICD-10-CM | POA: Insufficient documentation

## 2023-01-08 DIAGNOSIS — Z79899 Other long term (current) drug therapy: Secondary | ICD-10-CM | POA: Insufficient documentation

## 2023-01-08 DIAGNOSIS — Z95828 Presence of other vascular implants and grafts: Secondary | ICD-10-CM

## 2023-01-08 DIAGNOSIS — Z888 Allergy status to other drugs, medicaments and biological substances status: Secondary | ICD-10-CM | POA: Insufficient documentation

## 2023-01-08 DIAGNOSIS — Z808 Family history of malignant neoplasm of other organs or systems: Secondary | ICD-10-CM | POA: Insufficient documentation

## 2023-01-08 DIAGNOSIS — F419 Anxiety disorder, unspecified: Secondary | ICD-10-CM | POA: Insufficient documentation

## 2023-01-08 DIAGNOSIS — Z7901 Long term (current) use of anticoagulants: Secondary | ICD-10-CM | POA: Insufficient documentation

## 2023-01-08 DIAGNOSIS — Z5112 Encounter for antineoplastic immunotherapy: Secondary | ICD-10-CM | POA: Insufficient documentation

## 2023-01-08 DIAGNOSIS — Z9049 Acquired absence of other specified parts of digestive tract: Secondary | ICD-10-CM | POA: Diagnosis not present

## 2023-01-08 DIAGNOSIS — D61818 Other pancytopenia: Secondary | ICD-10-CM | POA: Insufficient documentation

## 2023-01-08 DIAGNOSIS — Z5111 Encounter for antineoplastic chemotherapy: Secondary | ICD-10-CM | POA: Insufficient documentation

## 2023-01-08 DIAGNOSIS — G4730 Sleep apnea, unspecified: Secondary | ICD-10-CM | POA: Insufficient documentation

## 2023-01-08 DIAGNOSIS — I82501 Chronic embolism and thrombosis of unspecified deep veins of right lower extremity: Secondary | ICD-10-CM | POA: Insufficient documentation

## 2023-01-08 DIAGNOSIS — Z79620 Long term (current) use of immunosuppressive biologic: Secondary | ICD-10-CM | POA: Insufficient documentation

## 2023-01-08 DIAGNOSIS — K5900 Constipation, unspecified: Secondary | ICD-10-CM | POA: Diagnosis not present

## 2023-01-08 DIAGNOSIS — Z79631 Long term (current) use of antimetabolite agent: Secondary | ICD-10-CM | POA: Diagnosis not present

## 2023-01-08 DIAGNOSIS — C3490 Malignant neoplasm of unspecified part of unspecified bronchus or lung: Secondary | ICD-10-CM

## 2023-01-08 LAB — CMP (CANCER CENTER ONLY)
ALT: 19 U/L (ref 0–44)
AST: 16 U/L (ref 15–41)
Albumin: 4.1 g/dL (ref 3.5–5.0)
Alkaline Phosphatase: 54 U/L (ref 38–126)
Anion gap: 6 (ref 5–15)
BUN: 18 mg/dL (ref 8–23)
CO2: 29 mmol/L (ref 22–32)
Calcium: 9.1 mg/dL (ref 8.9–10.3)
Chloride: 102 mmol/L (ref 98–111)
Creatinine: 0.64 mg/dL (ref 0.44–1.00)
GFR, Estimated: >60 mL/min (ref >60)
Glucose, Bld: 99 mg/dL (ref 70–99)
Potassium: 3.5 mmol/L (ref 3.5–5.1)
Sodium: 137 mmol/L (ref 135–145)
Total Bilirubin: 0.6 mg/dL (ref 0.3–1.2)
Total Protein: 6.3 g/dL — ABNORMAL LOW (ref 6.5–8.1)

## 2023-01-08 LAB — CBC WITH DIFFERENTIAL (CANCER CENTER ONLY)
Abs Immature Granulocytes: 0.03 10*3/uL (ref 0.00–0.07)
Basophils Absolute: 0 10*3/uL (ref 0.0–0.1)
Basophils Relative: 1 %
Eosinophils Absolute: 0 10*3/uL (ref 0.0–0.5)
Eosinophils Relative: 1 %
HCT: 33.9 % — ABNORMAL LOW (ref 36.0–46.0)
Hemoglobin: 11.6 g/dL — ABNORMAL LOW (ref 12.0–15.0)
Immature Granulocytes: 1 %
Lymphocytes Relative: 32 %
Lymphs Abs: 0.7 10*3/uL (ref 0.7–4.0)
MCH: 34 pg (ref 26.0–34.0)
MCHC: 34.2 g/dL (ref 30.0–36.0)
MCV: 99.4 fL (ref 80.0–100.0)
Monocytes Absolute: 0.5 10*3/uL (ref 0.1–1.0)
Monocytes Relative: 23 %
Neutro Abs: 0.9 10*3/uL — ABNORMAL LOW (ref 1.7–7.7)
Neutrophils Relative %: 42 %
Platelet Count: 99 10*3/uL — ABNORMAL LOW (ref 150–400)
RBC: 3.41 MIL/uL — ABNORMAL LOW (ref 3.87–5.11)
RDW: 15.7 % — ABNORMAL HIGH (ref 11.5–15.5)
WBC Count: 2.1 10*3/uL — ABNORMAL LOW (ref 4.0–10.5)
nRBC: 0 % (ref 0.0–0.2)

## 2023-01-08 MED ORDER — SODIUM CHLORIDE 0.9% FLUSH
10.0000 mL | Freq: Once | INTRAVENOUS | Status: AC
  Administered 2023-01-08: 10 mL

## 2023-01-08 MED ORDER — HEPARIN SOD (PORK) LOCK FLUSH 100 UNIT/ML IV SOLN
500.0000 [IU] | Freq: Once | INTRAVENOUS | Status: AC
  Administered 2023-01-08: 500 [IU]

## 2023-01-09 ENCOUNTER — Encounter: Payer: Self-pay | Admitting: Internal Medicine

## 2023-01-15 ENCOUNTER — Inpatient Hospital Stay: Payer: Medicare Other

## 2023-01-15 DIAGNOSIS — Z95828 Presence of other vascular implants and grafts: Secondary | ICD-10-CM

## 2023-01-15 DIAGNOSIS — Z5112 Encounter for antineoplastic immunotherapy: Secondary | ICD-10-CM | POA: Diagnosis not present

## 2023-01-15 DIAGNOSIS — C7932 Secondary malignant neoplasm of cerebral meninges: Secondary | ICD-10-CM

## 2023-01-15 DIAGNOSIS — C3490 Malignant neoplasm of unspecified part of unspecified bronchus or lung: Secondary | ICD-10-CM

## 2023-01-15 LAB — CBC WITH DIFFERENTIAL (CANCER CENTER ONLY)
Abs Immature Granulocytes: 0.03 10*3/uL (ref 0.00–0.07)
Basophils Absolute: 0 10*3/uL (ref 0.0–0.1)
Basophils Relative: 1 %
Eosinophils Absolute: 0 10*3/uL (ref 0.0–0.5)
Eosinophils Relative: 1 %
HCT: 33 % — ABNORMAL LOW (ref 36.0–46.0)
Hemoglobin: 11.1 g/dL — ABNORMAL LOW (ref 12.0–15.0)
Immature Granulocytes: 1 %
Lymphocytes Relative: 11 %
Lymphs Abs: 0.5 10*3/uL — ABNORMAL LOW (ref 0.7–4.0)
MCH: 34.2 pg — ABNORMAL HIGH (ref 26.0–34.0)
MCHC: 33.6 g/dL (ref 30.0–36.0)
MCV: 101.5 fL — ABNORMAL HIGH (ref 80.0–100.0)
Monocytes Absolute: 0.6 10*3/uL (ref 0.1–1.0)
Monocytes Relative: 13 %
Neutro Abs: 3.3 10*3/uL (ref 1.7–7.7)
Neutrophils Relative %: 73 %
Platelet Count: 175 10*3/uL (ref 150–400)
RBC: 3.25 MIL/uL — ABNORMAL LOW (ref 3.87–5.11)
RDW: 16.3 % — ABNORMAL HIGH (ref 11.5–15.5)
WBC Count: 4.4 10*3/uL (ref 4.0–10.5)
nRBC: 0 % (ref 0.0–0.2)

## 2023-01-15 LAB — CMP (CANCER CENTER ONLY)
ALT: 42 U/L (ref 0–44)
AST: 26 U/L (ref 15–41)
Albumin: 4 g/dL (ref 3.5–5.0)
Alkaline Phosphatase: 56 U/L (ref 38–126)
Anion gap: 6 (ref 5–15)
BUN: 11 mg/dL (ref 8–23)
CO2: 28 mmol/L (ref 22–32)
Calcium: 9.2 mg/dL (ref 8.9–10.3)
Chloride: 106 mmol/L (ref 98–111)
Creatinine: 0.83 mg/dL (ref 0.44–1.00)
GFR, Estimated: >60 mL/min (ref >60)
Glucose, Bld: 124 mg/dL — ABNORMAL HIGH (ref 70–99)
Potassium: 3.9 mmol/L (ref 3.5–5.1)
Sodium: 140 mmol/L (ref 135–145)
Total Bilirubin: 0.5 mg/dL (ref 0.3–1.2)
Total Protein: 6.3 g/dL — ABNORMAL LOW (ref 6.5–8.1)

## 2023-01-15 MED ORDER — HEPARIN SOD (PORK) LOCK FLUSH 100 UNIT/ML IV SOLN
500.0000 [IU] | Freq: Once | INTRAVENOUS | Status: AC
  Administered 2023-01-15: 500 [IU]

## 2023-01-15 MED ORDER — SODIUM CHLORIDE 0.9% FLUSH
10.0000 mL | Freq: Once | INTRAVENOUS | Status: AC
  Administered 2023-01-15: 10 mL

## 2023-01-19 ENCOUNTER — Other Ambulatory Visit: Payer: Self-pay

## 2023-01-21 ENCOUNTER — Encounter: Payer: Self-pay | Admitting: Internal Medicine

## 2023-01-21 ENCOUNTER — Inpatient Hospital Stay: Payer: Medicare Other

## 2023-01-21 ENCOUNTER — Encounter: Payer: Self-pay | Admitting: Medical Oncology

## 2023-01-21 ENCOUNTER — Other Ambulatory Visit: Payer: Self-pay

## 2023-01-21 ENCOUNTER — Inpatient Hospital Stay (HOSPITAL_BASED_OUTPATIENT_CLINIC_OR_DEPARTMENT_OTHER): Payer: Medicare Other | Admitting: Internal Medicine

## 2023-01-21 VITALS — BP 137/60 | HR 90 | Temp 97.9°F | Resp 20

## 2023-01-21 DIAGNOSIS — C3490 Malignant neoplasm of unspecified part of unspecified bronchus or lung: Secondary | ICD-10-CM

## 2023-01-21 DIAGNOSIS — Z95828 Presence of other vascular implants and grafts: Secondary | ICD-10-CM

## 2023-01-21 DIAGNOSIS — Z5112 Encounter for antineoplastic immunotherapy: Secondary | ICD-10-CM | POA: Diagnosis not present

## 2023-01-21 LAB — CBC WITH DIFFERENTIAL (CANCER CENTER ONLY)
Abs Immature Granulocytes: 0.01 10*3/uL (ref 0.00–0.07)
Basophils Absolute: 0 10*3/uL (ref 0.0–0.1)
Basophils Relative: 1 %
Eosinophils Absolute: 0 10*3/uL (ref 0.0–0.5)
Eosinophils Relative: 1 %
HCT: 33.8 % — ABNORMAL LOW (ref 36.0–46.0)
Hemoglobin: 11.2 g/dL — ABNORMAL LOW (ref 12.0–15.0)
Immature Granulocytes: 0 %
Lymphocytes Relative: 12 %
Lymphs Abs: 0.5 10*3/uL — ABNORMAL LOW (ref 0.7–4.0)
MCH: 33.8 pg (ref 26.0–34.0)
MCHC: 33.1 g/dL (ref 30.0–36.0)
MCV: 102.1 fL — ABNORMAL HIGH (ref 80.0–100.0)
Monocytes Absolute: 0.5 10*3/uL (ref 0.1–1.0)
Monocytes Relative: 14 %
Neutro Abs: 2.7 10*3/uL (ref 1.7–7.7)
Neutrophils Relative %: 72 %
Platelet Count: 204 10*3/uL (ref 150–400)
RBC: 3.31 MIL/uL — ABNORMAL LOW (ref 3.87–5.11)
RDW: 15.8 % — ABNORMAL HIGH (ref 11.5–15.5)
WBC Count: 3.8 10*3/uL — ABNORMAL LOW (ref 4.0–10.5)
nRBC: 0 % (ref 0.0–0.2)

## 2023-01-21 LAB — CMP (CANCER CENTER ONLY)
ALT: 25 U/L (ref 0–44)
AST: 18 U/L (ref 15–41)
Albumin: 4.1 g/dL (ref 3.5–5.0)
Alkaline Phosphatase: 57 U/L (ref 38–126)
Anion gap: 7 (ref 5–15)
BUN: 10 mg/dL (ref 8–23)
CO2: 27 mmol/L (ref 22–32)
Calcium: 9.2 mg/dL (ref 8.9–10.3)
Chloride: 104 mmol/L (ref 98–111)
Creatinine: 0.64 mg/dL (ref 0.44–1.00)
GFR, Estimated: >60 mL/min (ref >60)
Glucose, Bld: 123 mg/dL — ABNORMAL HIGH (ref 70–99)
Potassium: 3.9 mmol/L (ref 3.5–5.1)
Sodium: 138 mmol/L (ref 135–145)
Total Bilirubin: 0.5 mg/dL (ref 0.3–1.2)
Total Protein: 6.5 g/dL (ref 6.5–8.1)

## 2023-01-21 LAB — TSH: TSH: 3.459 u[IU]/mL (ref 0.350–4.500)

## 2023-01-21 MED ORDER — ONDANSETRON HCL 4 MG/2ML IJ SOLN
8.0000 mg | Freq: Once | INTRAMUSCULAR | Status: AC
  Administered 2023-01-21: 8 mg via INTRAVENOUS
  Filled 2023-01-21: qty 4

## 2023-01-21 MED ORDER — CYANOCOBALAMIN 1000 MCG/ML IJ SOLN
1000.0000 ug | Freq: Once | INTRAMUSCULAR | Status: AC
  Administered 2023-01-21: 1000 ug via INTRAMUSCULAR
  Filled 2023-01-21: qty 1

## 2023-01-21 MED ORDER — SODIUM CHLORIDE 0.9% FLUSH
10.0000 mL | Freq: Once | INTRAVENOUS | Status: AC
  Administered 2023-01-21: 10 mL

## 2023-01-21 MED ORDER — SODIUM CHLORIDE 0.9% FLUSH
10.0000 mL | INTRAVENOUS | Status: DC | PRN
  Administered 2023-01-21: 10 mL

## 2023-01-21 MED ORDER — SODIUM CHLORIDE 0.9 % IV SOLN: Freq: Once | INTRAVENOUS | Status: AC

## 2023-01-21 MED ORDER — SODIUM CHLORIDE 0.9 % IV SOLN
200.0000 mg | Freq: Once | INTRAVENOUS | Status: AC
  Administered 2023-01-21: 200 mg via INTRAVENOUS
  Filled 2023-01-21: qty 200

## 2023-01-21 MED ORDER — HEPARIN SOD (PORK) LOCK FLUSH 100 UNIT/ML IV SOLN
500.0000 [IU] | Freq: Once | INTRAVENOUS | Status: AC | PRN
  Administered 2023-01-21: 500 [IU]

## 2023-01-21 MED ORDER — SODIUM CHLORIDE 0.9 % IV SOLN
500.0000 mg/m2 | Freq: Once | INTRAVENOUS | Status: AC
  Administered 2023-01-21: 1000 mg via INTRAVENOUS
  Filled 2023-01-21: qty 40

## 2023-01-21 NOTE — Patient Instructions (Signed)
[facility]  Discharge Instructions: Thank you for choosing [facility] to provide your oncology and hematology care.   If you have a lab appointment with the Cancer Center, please go directly to the Cancer Center and check in at the registration area.   Wear comfortable clothing and clothing appropriate for easy access to any Portacath or PICC line.   We strive to give you quality time with your provider. You may need to reschedule your appointment if you arrive late (15 or more minutes).  Arriving late affects you and other patients whose appointments are after yours.  Also, if you miss three or more appointments without notifying the office, you may be dismissed from the clinic at the provider's discretion.      For prescription refill requests, have your pharmacy contact our office and allow 72 hours for refills to be completed.    Today you received the following chemotherapy and/or immunotherapy agents: Pembrolizumab (Keytruda) and Pemtrexed (Alimta)   To help prevent nausea and vomiting after your treatment, we encourage you to take your nausea medication as directed.  BELOW ARE SYMPTOMS THAT SHOULD BE REPORTED IMMEDIATELY: *FEVER GREATER THAN 100.4 F (38 C) OR HIGHER *CHILLS OR SWEATING *NAUSEA AND VOMITING THAT IS NOT CONTROLLED WITH YOUR NAUSEA MEDICATION *UNUSUAL SHORTNESS OF BREATH *UNUSUAL BRUISING OR BLEEDING *URINARY PROBLEMS (pain or burning when urinating, or frequent urination) *BOWEL PROBLEMS (unusual diarrhea, constipation, pain near the anus) TENDERNESS IN MOUTH AND THROAT WITH OR WITHOUT PRESENCE OF ULCERS (sore throat, sores in mouth, or a toothache) UNUSUAL RASH, SWELLING OR PAIN  UNUSUAL VAGINAL DISCHARGE OR ITCHING   Items with * indicate a potential emergency and should be followed up as soon as possible or go to the Emergency Department if any problems should occur.  Please show the CHEMOTHERAPY ALERT CARD  or IMMUNOTHERAPY ALERT CARD at check-in to the Emergency Department and triage nurse.  Should you have questions after your visit or need to cancel or reschedule your appointment, please contact [facility]  Dept: [phone]  and follow the prompts.  Office hours are 8:00 a.m. to 4:30 p.m. Monday - Friday. Please note that voicemails left after 4:00 p.m. may not be returned until the following business day.  We are closed weekends and major holidays. You have access to a nurse at all times for urgent questions. Please call the main number to the clinic Dept: [phone] and follow the prompts.   For any non-urgent questions, you may also contact your provider using MyChart. We now offer e-Visits for anyone 71 and older to request care online for non-urgent symptoms. For details visit [email or web address].   Also download the MyChart app! Go to the app store, search "MyChart", open the app, select [place], and log in with your MyChart username and password.  Vitamin B12 Injection What is this medication? Vitamin B12 (VAHY tuh min B12) prevents and treats low vitamin B12 levels in your body. It is used in people who do not get enough vitamin B12 from their diet or when their digestive tract does not absorb enough. Vitamin B12 plays an important role in maintaining the health of your nervous system and red blood cells. This medicine may be used for other purposes; ask your health care provider or pharmacist if you have questions. COMMON BRAND NAME(S): B-12 Compliance Kit, B-12 Injection Kit, Cyomin, Dodex, LA-12, Nutri-Twelve, Physicians EZ Use B-12, Primabalt What should I  tell my care team before I take this medication? They need to know if you have any of these conditions: Kidney disease Leber's disease Megaloblastic anemia An unusual or allergic reaction to cyanocobalamin, cobalt, other medications, foods, dyes, or preservatives Pregnant or trying  to get pregnant Breast-feeding How should I use this medication? This medication is injected into a muscle or deeply under the skin. It is usually given in a clinic or care team's office. However, your care team may teach you how to inject yourself. Follow all instructions. Talk to your care team about the use of this medication in children. Special care may be needed. Overdosage: If you think you have taken too much of this medicine contact a poison control center or emergency room at once. NOTE: This medicine is only for you. Do not share this medicine with others. What if I miss a dose? If you are given your dose at a clinic or care team's office, call to reschedule your appointment. If you give your own injections, and you miss a dose, take it as soon as you can. If it is almost time for your next dose, take only that dose. Do not take double or extra doses. What may interact with this medication? Alcohol Colchicine This list may not describe all possible interactions. Give your health care provider a list of all the medicines, herbs, non-prescription drugs, or dietary supplements you use. Also tell them if you smoke, drink alcohol, or use illegal drugs. Some items may interact with your medicine. What should I watch for while using this medication? Visit your care team regularly. You may need blood work done while you are taking this medication. You may need to follow a special diet. Talk to your care team. Limit your alcohol intake and avoid smoking to get the best benefit. What side effects may I notice from receiving this medication? Side effects that you should report to your care team as soon as possible: Allergic reactions--skin rash, itching, hives, swelling of the face, lips, tongue, or throat Swelling of the ankles, hands, or feet Trouble breathing Side effects that usually do not require medical attention (report to your care team if they continue or are  bothersome): Diarrhea This list may not describe all possible side effects. Call your doctor for medical advice about side effects. You may report side effects to FDA at 1-800-FDA-1088. Where should I keep my medication? Keep out of the reach of children. Store at room temperature between 15 and 30 degrees C (59 and 85 degrees F). Protect from light. Throw away any unused medication after the expiration date. NOTE: This sheet is a summary. It may not cover all possible information. If you have questions about this medicine, talk to your doctor, pharmacist, or health care provider.  2023 Elsevier/Gold Standard (2021-06-05 00:00:00)

## 2023-01-21 NOTE — Progress Notes (Signed)
Patient seen by Dr. Mohamed  Vitals are within treatment parameters.  Labs reviewed: and are within treatment parameters.  Per physician team, patient is ready for treatment and there are NO modifications to the treatment plan.  

## 2023-01-21 NOTE — Progress Notes (Signed)
[facility] [phone]   [phone]  OFFICE PROGRESS NOTE  Si Gaul, MD [address] [place] [address]  DIAGNOSIS: Stage IV (T2 a, N3, M1b) non-small cell lung cancer, adenocarcinoma presented with left upper lobe perihilar mass in addition to left hilar and mediastinal lymphadenopathy as well as left supraclavicular lymphadenopathy with brain metastasis diagnosed in September 2023.   [name]  Detected Alteration(s) / Biomarker(s) Associated FDA-approved therapies Clinical Trial Availability % cfDNA or Amplification  KRAS G12C approved by FDA Adagrasib, Sotorasib Yes 1.7%  TP53 R249S None Yes 1.5%  PRIOR THERAPY: 1) SRS the the metastatic brain lesion under the care of Dr. Basilio Cairo 2) Craniotomy on 07/10/22 under the care of Dr. Jake Samples 3) radiation to the chest under care of Dr. Roselind Messier.  Last dose on 08/23/2022   CURRENT THERAPY: Systemic chemotherapy with carboplatin for an AUC of 5, to 500 mg/m2, and Keytruda 200 mg IV every 3 weeks.  First dose expected on October 08, 2022.  Status post 5 cycles.  Starting from cycle #5 she will be on maintenance treatment with Alimta and Keytruda every 3 weeks.  INTERVAL HISTORY: [place] 82 y.o. female returns to the clinic today for follow-up visit accompanied by her husband.  The patient is feeling fine today with no concerning complaints except for fatigue and shortness of breath with exertion.  She has no current chest pain, cough or hemoptysis.  She has no nausea, vomiting, diarrhea or constipation.  She has no headache or visual changes.  She denied having any recent weight loss or night sweats.  She has been tolerating her treatment with maintenance Alimta and Keytruda fairly well.  She had CT angiogram of the chest few weeks ago that showed no evidence for pulmonary embolism or other progressive disease.  She is here today for evaluation before starting cycle #6 of her  treatment.   MEDICAL HISTORY: Past Medical History:  Diagnosis Date   Anxiety    "had panic attacks years ago"   Asthma    COPD (chronic obstructive pulmonary disease) (HCC)    Goiter 2022   History of hiatal hernia    "dx in college, never bothered me"   History of kidney stones 2012   History of radiation therapy    Left Lung-07/10/22-08/23/22- Dr. Antony Blackbird   Hypertension    Lung cancer [facility])    Sleep apnea     ALLERGIES:  is allergic to compazine [prochlorperazine], amoxicillin, norvasc [amlodipine], and trelegy ellipta [fluticasone-umeclidin-vilant].  MEDICATIONS:  Current Outpatient Medications  Medication Sig Dispense Refill   APIXABAN (ELIQUIS) VTE STARTER PACK (10MG  AND 5MG ) Take as directed on package: start with two-5mg  tablets twice daily for 7 days. On day 8, switch to one-5mg  tablet twice daily. 1 each 0   acetaminophen (TYLENOL) 500 MG tablet Take 500 mg by mouth every 6 (six) hours as needed for mild pain or headache.     albuterol (VENTOLIN HFA) 108 (90 Base) MCG/ACT inhaler Inhale 2 puffs into the lungs every 6 (six) hours as needed for wheezing or shortness of breath. 18 g 1   ALPRAZolam (XANAX) 0.25 MG tablet Take 0.25 mg by mouth at bedtime as needed for anxiety.     Budeson-Glycopyrrol-Formoterol (BREZTRI AEROSPHERE) 160-9-4.8 MCG/ACT AERO INHALE 2 PUFFS BY MOUTH EVERY MORNING AND EVERY NIGHT AT BEDTIME (Patient taking differently: Inhale 2 puffs into the lungs in the morning and at bedtime.) 10.7 g 5   folic acid (  FOLVITE) 1 MG tablet Take 1 tablet (1 mg total) by mouth daily. 30 tablet 2   gabapentin (NEURONTIN) 100 MG capsule Take 1 capsule (100 mg total) by mouth 2 (two) times daily. 90 capsule 2   guaiFENesin (MUCINEX) 600 MG 12 hr tablet Take 1 tablet (600 mg total) by mouth 2 (two) times daily.     ipratropium-albuterol (DUONEB) 0.5-2.5 (3) MG/3ML SOLN Take 3 mLs by nebulization every 6 (six) hours as needed. 360 mL 0   levocetirizine (XYZAL) 5 MG  tablet Take 5 mg by mouth at bedtime as needed (for seasonal allergies- if not taking generic Claritin).     LORazepam (ATIVAN) 1 MG tablet Take 1 tablet (1 mg total) by mouth as needed. 30 min Prior to MRI or brain radiation procedure (Patient not taking: Reported on 12/10/2022) 5 tablet 0   metFORMIN (GLUCOPHAGE-XR) 500 MG 24 hr tablet Take 500 mg by mouth every morning.     nystatin (MYCOSTATIN/NYSTOP) powder Apply 1 Application topically 3 (three) times daily. (Patient not taking: Reported on 12/10/2022) 30 g 0   ondansetron (ZOFRAN) 8 MG tablet Take 1 tablet (8 mg total) by mouth every 8 (eight) hours as needed for nausea or vomiting. (Patient not taking: Reported on 12/10/2022) 30 tablet 2   OVER THE COUNTER MEDICATION Apply 1 application  topically See admin instructions. Sonafine Wound Dressing Topical Emulsion- Apply to the back 1-2 times a day     pantoprazole (PROTONIX) 20 MG tablet Take 20 mg by mouth every evening.     telmisartan (MICARDIS) 20 MG tablet Take 1 tablet (20 mg total) by mouth daily. NEED OV. 15 tablet 0   traMADol (ULTRAM) 50 MG tablet Take 2 tablets (100 mg total) by mouth every 6 (six) hours as needed for severe pain. (Patient taking differently: Take 50 mg by mouth every 6 (six) hours as needed for severe pain.) 30 tablet 0   Zinc Acetate, Oral, (ZINC ACETATE PO) Take 1 tablet by mouth daily. Received from herbalist     No current facility-administered medications for this visit.    SURGICAL HISTORY:  Past Surgical History:  Procedure Laterality Date   APPENDECTOMY     APPLICATION OF CRANIAL NAVIGATION N/A 07/11/2022   Procedure: APPLICATION OF CRANIAL NAVIGATION;  Surgeon: Dawley, Alan Mulder, DO;  Location: [facility] OR;  Service: Neurosurgery;  Laterality: N/A;   BRONCHIAL BIOPSY  06/12/2021   Procedure: BRONCHIAL BIOPSIES;  Surgeon: Josephine Igo, DO;  Location: [facility] ENDOSCOPY;  Service: Pulmonary;;   BRONCHIAL BRUSHINGS  06/12/2021   Procedure: BRONCHIAL BRUSHINGS;  Surgeon:  Josephine Igo, DO;  Location: [facility];  Service: Pulmonary;;   BRONCHIAL WASHINGS  06/12/2021   Procedure: BRONCHIAL WASHINGS;  Surgeon: Josephine Igo, DO;  Location: [facility];  Service: Pulmonary;;   CHOLECYSTECTOMY     COLON RESECTION     CRANIOTOMY N/A 07/11/2022   Procedure: Craniotomy for resection of tumor;  Surgeon: Bethann Goo, DO;  Location: [facility] OR;  Service: Neurosurgery;  Laterality: N/A;  RM 19   FIDUCIAL MARKER PLACEMENT  06/12/2021   Procedure: FIDUCIAL MARKER PLACEMENT;  Surgeon: Josephine Igo, DO;  Location: [facility];  Service: Pulmonary;;   hernia     x 2   IR IMAGING GUIDED PORT INSERTION  10/11/2022   TONSILLECTOMY     VIDEO BRONCHOSCOPY WITH ENDOBRONCHIAL NAVIGATION Bilateral 06/12/2021   Procedure: VIDEO BRONCHOSCOPY WITH ENDOBRONCHIAL NAVIGATION;  Surgeon: Josephine Igo, DO;  Location: [facility];  Service:  Pulmonary;  Laterality: Bilateral;  ION   VIDEO BRONCHOSCOPY WITH RADIAL ENDOBRONCHIAL ULTRASOUND  06/12/2021   Procedure: RADIAL ENDOBRONCHIAL ULTRASOUND;  Surgeon: Josephine Igo, DO;  Location: [facility];  Service: Pulmonary;;    REVIEW OF SYSTEMS:  Constitutional: positive for fatigue Eyes: negative Ears, nose, mouth, throat, and face: negative Respiratory: positive for dyspnea on exertion Cardiovascular: negative Gastrointestinal: negative Genitourinary:negative Integument/breast: negative Hematologic/lymphatic: negative Musculoskeletal:positive for muscle weakness Neurological: negative Behavioral/Psych: negative Endocrine: negative Allergic/Immunologic: negative   PHYSICAL EXAMINATION: General appearance: alert, cooperative, fatigued, and no distress Head: Normocephalic, without obvious abnormality, atraumatic Neck: no adenopathy, no JVD, supple, symmetrical, trachea midline, and thyroid not enlarged, symmetric, no tenderness/mass/nodules Lymph nodes: Cervical, supraclavicular, and axillary nodes normal. Resp: clear  to auscultation bilaterally Back: symmetric, no curvature. ROM normal. No CVA tenderness. Cardio: regular rate and rhythm, S1, S2 normal, no murmur, click, rub or gallop GI: soft, non-tender; bowel sounds normal; no masses,  no organomegaly Extremities: extremities normal, atraumatic, no cyanosis or edema Neurologic: Alert and oriented X 3, normal strength and tone. Normal symmetric reflexes. Normal coordination and gait  ECOG PERFORMANCE STATUS: 1 - Symptomatic but completely ambulatory  Blood pressure (!) 126/52, pulse 88, temperature 98.3 F (36.8 C), temperature source Oral, resp. rate 18, weight 214 lb 3.2 oz (97.2 kg), SpO2 97 %.  LABORATORY DATA: Lab Results  Component Value Date   WBC 4.4 01/15/2023   HGB 11.1 (L) 01/15/2023   HCT 33.0 (L) 01/15/2023   MCV 101.5 (H) 01/15/2023   PLT 175 01/15/2023      Chemistry      Component Value Date/Time   NA 140 01/15/2023 1109   NA 139 09/17/2019 1550   K 3.9 01/15/2023 1109   CL 106 01/15/2023 1109   CO2 28 01/15/2023 1109   BUN 11 01/15/2023 1109   BUN 9 09/17/2019 1550   CREATININE 0.83 01/15/2023 1109      Component Value Date/Time   CALCIUM 9.2 01/15/2023 1109   ALKPHOS 56 01/15/2023 1109   AST 26 01/15/2023 1109   ALT 42 01/15/2023 1109   BILITOT 0.5 01/15/2023 1109       RADIOGRAPHIC STUDIES: DG Chest Portable 1 View  Result Date: 01/02/2023 CLINICAL DATA:  SOB EXAM: PORTABLE CHEST 1 VIEW COMPARISON:  Chest x-ray 09/06/2022, CT angiography chest 12/31/2022. FINDINGS: Right chest wall Port-A-Cath with tip overlying the superior cavoatrial junction. The heart and mediastinal contours are unchanged. Aortic calcification. Persistent but decreased left upper lobe airspace opacity. No pulmonary edema. No pleural effusion. No pneumothorax. No acute osseous abnormality. IMPRESSION: 1. Left upper lung zone airspace opacity better evaluated on CT angiography chest 12/31/2022. 2.  Aortic Atherosclerosis (ICD10-I70.0).  Electronically Signed   By: Tish Frederickson M.D.   On: 01/02/2023 18:43   CT Angio Chest Pulmonary Embolism (PE) W or WO Contrast  Result Date: 12/31/2022 CLINICAL DATA:  Chest pain, dyspnea on exertion. History of lung cancer. EXAM: CT ANGIOGRAPHY CHEST WITH CONTRAST TECHNIQUE: Multidetector CT imaging of the chest was performed using the standard protocol during bolus administration of intravenous contrast. Multiplanar CT image reconstructions and MIPs were obtained to evaluate the vascular anatomy. RADIATION DOSE REDUCTION: This exam was performed according to the departmental dose-optimization program which includes automated exposure control, adjustment of the mA and/or kV according to patient size and/or use of iterative reconstruction technique. CONTRAST:  75mL OMNIPAQUE IOHEXOL 350 MG/ML SOLN COMPARISON:  December 06, 2022. FINDINGS: Cardiovascular: Satisfactory opacification of the pulmonary arteries to the segmental level.  No evidence of pulmonary embolism. Normal heart size. No pericardial effusion. Right internal jugular Port-A-Cath is again noted. Mediastinum/Nodes: Stable 2.7 cm left thyroid nodule. Esophagus is unremarkable. No significant mediastinal adenopathy is noted. Lungs/Pleura: No pneumothorax is noted. Minimal left pleural effusion is noted. Grossly stable 13 x 8 mm part solid nodular density with associated calcifications is noted. Stable left upper lobe opacities are noted most likely representing post treatment change. Stable mild emphysematous changes noted. Grossly stable 16 x 13 mm left upper lobe perihilar nodule is noted. Upper Abdomen: No acute abnormality. Musculoskeletal: No chest wall abnormality. No acute or significant osseous findings. Review of the MIP images confirms the above findings. IMPRESSION: No definite evidence of pulmonary embolus. Grossly stable 13 x 8 mm part solid nodular density with a is a calcifications is noted in right upper lobe. Stable left upper lobe  opacities are noted most likely representing post treatment change. Grossly stable 16 x 13 mm left upper lobe perihilar nodule is again noted. Stable 2.7 cm left thyroid nodule. Recommend thyroid US. (Ref: J Am Coll Radiol. 2015 Feb;12(2): 143-50). Aortic Atherosclerosis (ICD10-I70.0) and Emphysema (ICD10-J43.9). Electronically Signed   By: Lupita Raider M.D.   On: 12/31/2022 14:25    ASSESSMENT AND PLAN: This is a very pleasant 82 years old white female with likely stage IV (T2a, N3, M1B) non-small cell lung cancer, adenocarcinoma presented with left upper lobe perihilar mass in addition to left hilar and mediastinal lymphadenopathy as well as left supraclavicular lymphadenopathy and suspicious solitary brain metastasis diagnosed in September 2023. The molecular studies showed positive KRAS G12C mutation The patient underwent SRS followed by craniotomy on July 10, 2022 to the solitary brain metastasis.  She also underwent palliative radiotherapy to the left upper lobe perihilar mass and mediastinal lymphadenopathy under the care of Dr. Roselind Messier. The patient is currently on systemic chemotherapy with carboplatin for AUC of 5, Alimta 500 Mg/M2 and Keytruda 200 Mg IV every 3 weeks.  First dose October 08, 2022.  Status post 5 cycles.  Starting from cycle #5 she will be on maintenance treatment with Alimta and Keytruda every 3 weeks. The patient has been tolerating her treatment well with no concerning adverse effect except for fatigue. I recommended for her to proceed with cycle #6 today as planned. The patient has a lot of question about the role of immunotherapy in her current treatment.  I had a lengthy discussion with the patient and her husband today about immunotherapy and mechanism of action and role in her treatment of lung cancer. She will proceed with cycle #6 today as planned. I will see her back for follow-up visit in 3 weeks for evaluation before the next cycle of her treatment. The patient  was advised to call immediately if she has any other concerning symptoms in the interval. 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   The patient voices understanding of current disease status and treatment options and is in agreement with the current care plan.  All questions were answered. The patient knows to call the clinic with any problems, questions or concerns. We can certainly see the patient much sooner if necessary.  The total time spent in the appointment was 35 minutes.  Disclaimer: This note was dictated with voice recognition software. Similar sounding words can inadvertently be transcribed and may not be corrected upon review.

## 2023-01-23 ENCOUNTER — Ambulatory Visit
Admission: RE | Admit: 2023-01-23 | Discharge: 2023-01-23 | Disposition: A | Discharge status: Home / Self Care | Payer: Medicare Other | Source: Ambulatory Visit | Attending: Radiation Oncology | Admitting: Radiation Oncology

## 2023-01-23 DIAGNOSIS — C7931 Secondary malignant neoplasm of brain: Secondary | ICD-10-CM

## 2023-01-23 LAB — T4: T4, Total: 8.9 ug/dL (ref 4.5–12.0)

## 2023-01-23 MED ORDER — HEPARIN SOD (PORK) LOCK FLUSH 100 UNIT/ML IV SOLN
500.0000 [IU] | Freq: Once | INTRAVENOUS | Status: AC
  Administered 2023-01-23: 500 [IU] via INTRAVENOUS

## 2023-01-23 MED ORDER — GADOPICLENOL 0.5 MMOL/ML IV SOLN
10.0000 mL | Freq: Once | INTRAVENOUS | Status: AC | PRN
  Administered 2023-01-23: 10 mL via INTRAVENOUS

## 2023-01-23 MED ORDER — SODIUM CHLORIDE 0.9% FLUSH
10.0000 mL | INTRAVENOUS | Status: DC | PRN
  Administered 2023-01-23: 10 mL via INTRAVENOUS

## 2023-01-24 ENCOUNTER — Other Ambulatory Visit: Payer: Self-pay

## 2023-01-24 ENCOUNTER — Other Ambulatory Visit: Payer: Self-pay | Admitting: Physician Assistant

## 2023-01-24 DIAGNOSIS — I749 Embolism and thrombosis of unspecified artery: Secondary | ICD-10-CM

## 2023-01-24 DIAGNOSIS — I8290 Acute embolism and thrombosis of unspecified vein: Secondary | ICD-10-CM

## 2023-01-24 MED ORDER — APIXABAN 5 MG PO TABS
5.0000 mg | ORAL_TABLET | Freq: Two times a day (BID) | ORAL | 3 refills | Status: DC
Start: 2023-01-24 — End: 2023-04-10

## 2023-01-24 NOTE — Telephone Encounter (Signed)
Needs maintenance dose

## 2023-01-25 ENCOUNTER — Other Ambulatory Visit: Payer: Self-pay

## 2023-01-27 ENCOUNTER — Telehealth: Payer: Self-pay

## 2023-01-27 ENCOUNTER — Inpatient Hospital Stay: Payer: Medicare Other

## 2023-01-27 ENCOUNTER — Encounter: Payer: Self-pay | Admitting: Internal Medicine

## 2023-01-27 NOTE — Telephone Encounter (Signed)
This nurse reached out to patient related to her My chart message stating that she had a fever, shortness of breath, weakness and sore throat again. These are the same symptoms that precipitated visit to ER a couple of weeks ago. Patient feels that her edema is worse, more painful and she is itching all the time.  Provider would like her to see symptom management.  This nurse offered appointment time of 130 and she is in agreement.  No further questions or concerns noted at this time.

## 2023-01-28 ENCOUNTER — Telehealth: Payer: Self-pay

## 2023-01-28 ENCOUNTER — Encounter: Payer: Self-pay | Admitting: Internal Medicine

## 2023-01-28 ENCOUNTER — Inpatient Hospital Stay: Payer: Medicare Other | Admitting: Physician Assistant

## 2023-01-28 ENCOUNTER — Inpatient Hospital Stay: Payer: Medicare Other

## 2023-01-28 NOTE — Telephone Encounter (Signed)
This nurse reached out to patient to reschedule appointment for today per provider request. Patient states that she is feeling much better today.  Patient is in agreement with moving her appointment to 01/29/2023.  No further questions or concerns noted at this time.

## 2023-01-29 ENCOUNTER — Inpatient Hospital Stay (HOSPITAL_BASED_OUTPATIENT_CLINIC_OR_DEPARTMENT_OTHER): Payer: Medicare Other | Admitting: Physician Assistant

## 2023-01-29 ENCOUNTER — Other Ambulatory Visit: Payer: Self-pay | Admitting: Physician Assistant

## 2023-01-29 ENCOUNTER — Encounter: Payer: Medicare Other | Admitting: Physician Assistant

## 2023-01-29 ENCOUNTER — Telehealth: Payer: Self-pay

## 2023-01-29 ENCOUNTER — Inpatient Hospital Stay: Payer: Medicare Other

## 2023-01-29 VITALS — BP 143/57 | HR 104 | Temp 98.8°F | Resp 17 | Wt 218.1 lb

## 2023-01-29 DIAGNOSIS — C3490 Malignant neoplasm of unspecified part of unspecified bronchus or lung: Secondary | ICD-10-CM | POA: Diagnosis not present

## 2023-01-29 DIAGNOSIS — I82501 Chronic embolism and thrombosis of unspecified deep veins of right lower extremity: Secondary | ICD-10-CM | POA: Diagnosis not present

## 2023-01-29 DIAGNOSIS — K5900 Constipation, unspecified: Secondary | ICD-10-CM | POA: Diagnosis not present

## 2023-01-29 DIAGNOSIS — D61818 Other pancytopenia: Secondary | ICD-10-CM

## 2023-01-29 DIAGNOSIS — Z95828 Presence of other vascular implants and grafts: Secondary | ICD-10-CM

## 2023-01-29 DIAGNOSIS — Z5112 Encounter for antineoplastic immunotherapy: Secondary | ICD-10-CM | POA: Diagnosis not present

## 2023-01-29 LAB — CBC WITH DIFFERENTIAL (CANCER CENTER ONLY)
Abs Immature Granulocytes: 0.02 10*3/uL (ref 0.00–0.07)
Basophils Absolute: 0 10*3/uL (ref 0.0–0.1)
Basophils Relative: 1 %
Eosinophils Absolute: 0 10*3/uL (ref 0.0–0.5)
Eosinophils Relative: 1 %
HCT: 30.7 % — ABNORMAL LOW (ref 36.0–46.0)
Hemoglobin: 10.3 g/dL — ABNORMAL LOW (ref 12.0–15.0)
Immature Granulocytes: 2 %
Lymphocytes Relative: 28 %
Lymphs Abs: 0.3 10*3/uL — ABNORMAL LOW (ref 0.7–4.0)
MCH: 34.6 pg — ABNORMAL HIGH (ref 26.0–34.0)
MCHC: 33.6 g/dL (ref 30.0–36.0)
MCV: 103 fL — ABNORMAL HIGH (ref 80.0–100.0)
Monocytes Absolute: 0.4 10*3/uL (ref 0.1–1.0)
Monocytes Relative: 34 %
Neutro Abs: 0.4 10*3/uL — CL (ref 1.7–7.7)
Neutrophils Relative %: 34 %
Platelet Count: 98 10*3/uL — ABNORMAL LOW (ref 150–400)
RBC: 2.98 MIL/uL — ABNORMAL LOW (ref 3.87–5.11)
RDW: 14.6 % (ref 11.5–15.5)
WBC Count: 1.1 10*3/uL — ABNORMAL LOW (ref 4.0–10.5)
nRBC: 0 % (ref 0.0–0.2)

## 2023-01-29 LAB — CMP (CANCER CENTER ONLY)
ALT: 33 U/L (ref 0–44)
AST: 30 U/L (ref 15–41)
Albumin: 3.9 g/dL (ref 3.5–5.0)
Alkaline Phosphatase: 64 U/L (ref 38–126)
Anion gap: 6 (ref 5–15)
BUN: 10 mg/dL (ref 8–23)
CO2: 28 mmol/L (ref 22–32)
Calcium: 9.1 mg/dL (ref 8.9–10.3)
Chloride: 103 mmol/L (ref 98–111)
Creatinine: 0.59 mg/dL (ref 0.44–1.00)
GFR, Estimated: >60 mL/min (ref >60)
Glucose, Bld: 160 mg/dL — ABNORMAL HIGH (ref 70–99)
Potassium: 3.8 mmol/L (ref 3.5–5.1)
Sodium: 137 mmol/L (ref 135–145)
Total Bilirubin: 0.7 mg/dL (ref 0.3–1.2)
Total Protein: 6.5 g/dL (ref 6.5–8.1)

## 2023-01-29 MED ORDER — SODIUM CHLORIDE 0.9% FLUSH
10.0000 mL | Freq: Once | INTRAVENOUS | Status: AC
  Administered 2023-01-29: 10 mL

## 2023-01-29 MED ORDER — FILGRASTIM-AAFI 480 MCG/0.8ML IJ SOSY
480.0000 ug | PREFILLED_SYRINGE | Freq: Once | INTRAMUSCULAR | Status: AC
  Administered 2023-01-29: 480 ug via SUBCUTANEOUS
  Filled 2023-01-29: qty 0.8

## 2023-01-29 MED ORDER — TBO-FILGRASTIM 480 MCG/0.8ML ~~LOC~~ SOSY: 480.0000 ug | PREFILLED_SYRINGE | Freq: Once | SUBCUTANEOUS | Status: DC

## 2023-01-29 MED ORDER — HEPARIN SOD (PORK) LOCK FLUSH 100 UNIT/ML IV SOLN
500.0000 [IU] | Freq: Once | INTRAVENOUS | Status: AC
  Administered 2023-01-29: 500 [IU]

## 2023-01-29 NOTE — Progress Notes (Signed)
Symptom Management Consult Note [facility]    Patient Care Team: Si Gaul, MD as PCP - General (Oncology) O'Neal, Ronnald Ramp, MD as Consulting Physician (Cardiology)    Name / MRN / DOB: Laurie Mccoy  [id number]  26-Apr-1941   Date of visit: 01/29/2023   Chief Complaint/Reason for visit: constipation   Current Therapy: Rande Lawman and alimta  Last treatment:  Day 1   Cycle 6 on 01/21/23   ASSESSMENT & PLAN: Patient is a 82 y.o. female  with oncologic history of stage IV (T2 a, N3, M1b) non-small cell lung cancer, adenocarcinoma  followed by Dr. Arbutus Ped.  I have viewed most recent oncology note and lab work.    # Stage IV (T2 a, N3, M1b) non-small cell lung cancer, adenocarcinoma  - Next appointment with oncologist is 02/11/23   #Constipation - Has history of the same.  - Abdominal exam is benign. She admits to passing flatus, unlikely SBO. - Discussed at length OTC management of constipation including Miralax and stool softener. She can try 1/2 bottle magnesium citrate if no relief with other methods. Patient advised against enema or suppository with on treatment. -CMP is overall unremarkable.  #Pancytopenia - Here for routine labs today. CBC is showing pancytopenia. -Likely related to treatment. WBC 1.1, ANC 0.4, HgB 10.3, platelets 98k. G-CSF ordered by primary oncology team for injections x 3 days. Patient encouraged to take Claritin to help with pain.  - Disucssed neutropenic precautions with patient.  - She has not been febrile in several days and has no infectious symptoms currently. Will hold off an antibiotics treatment at this time as she will be receiving G-CSF.  -Patient knows to go to the ED if she develops fever or infectious symptoms.  #Chronic DVT - Found on DVT study RLE last month. - Patient is requesting to stop eliquis as she does not feel she is tolerating it well. Has new itching and no improvement in leg swelling  She  is frustrated about being on a blood thinner and is asking to discontinue it. - Patient aware of risks versus benefits of discontinuing anticoagulation.  She plans to think on this further.  -Patient discussed with oncologist Dr. Shirline Frees.  He is agreeable with plan of care.    Heme/Onc History: Oncology History  Malignant neoplasm of unspecified part of unspecified bronchus or lung  06/21/2022 Initial Diagnosis   Malignant neoplasm of unspecified part of unspecified bronchus or lung (HCC)   06/21/2022 Cancer Staging   Staging form: Lung, AJCC 8th Edition - Clinical: Stage IVA Laurie Mccoy, cN3, cM1b) - Signed by Si Gaul, MD on 10/08/2022   07/22/2022 - 07/22/2022 Chemotherapy   Patient is on Treatment Plan : LUNG Carboplatin + Paclitaxel + XRT q7d     10/08/2022 -  Chemotherapy   Patient is on Treatment Plan : LUNG Carboplatin (5) + Pemetrexed (500) + Pembrolizumab (200) D1 q21d Induction x 4 cycles / Maintenance Pemetrexed (500) + Pembrolizumab (200) D1 q21d         Interval history-: [place] is a 82 y.o. female with oncologic history as above presenting to [facility] today with chief complaint of constipation.  She is accompanied by her spouse who provides additional history.  Patient states she has not had a bowel movement in the last 5 days.  She admits to passing flatus.  She is starting to feel uncomfortable.  She admits to history of constipation, states that it typically bothers her every 6 months  or so.  She has been taking MiraLAX nightly and Colace without much improvement.  She denies any changes in her appetite.  Her fluid intake has been strong overall she tells me.  She is already drinking 20 ounces of water today prior to arrival.  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  She feels like Eliquis has caused her to have generalized itching and there has been no improvement of swelling in her leg.  She  is interested in discontinuing this medication however wanted to make sure it was safe to do so.  She has not been on anticoagulation in the past.  She is also annoyed that she is bruising easier than normal.  Patient states since her last 2 treatments she has been overall feeling unwell.  She has fatigue and a sore throat.  She has had a fever with Tmax of 100.9 last week.  She was seen in the ED and diagnosed with adenovirus and parainfluenza virus 3 as well as a COPD exacerbation.  She does feel like she has recovered from the viruses.  She denies any sick contacts.      ROS  All other systems are reviewed and are negative for acute change except as noted in the HPI.    Allergies  Allergen Reactions   Compazine [Prochlorperazine]     Lose muscle control in face    Amoxicillin     itching   Norvasc [Amlodipine] Swelling    Lower extremity swelling     Trelegy Ellipta [Fluticasone-Umeclidin-Vilant] Swelling    Swelling in legs and headache.     Past Medical History:  Diagnosis Date   Anxiety    "had panic attacks years ago"   Asthma    COPD (chronic obstructive pulmonary disease)    Goiter 2022   History of hiatal hernia    "dx in college, never bothered me"   History of kidney stones 2012   History of radiation therapy    Left Lung-07/10/22-08/23/22- Dr. Antony Blackbird   Hypertension    Lung cancer    Sleep apnea      Past Surgical History:  Procedure Laterality Date   APPENDECTOMY     APPLICATION OF CRANIAL NAVIGATION N/A 07/11/2022   Procedure: APPLICATION OF CRANIAL NAVIGATION;  Surgeon: Bethann Goo, DO;  Location: [facility] OR;  Service: Neurosurgery;  Laterality: N/A;   BRONCHIAL BIOPSY  06/12/2021   Procedure: BRONCHIAL BIOPSIES;  Surgeon: Josephine Igo, DO;  Location: [facility] ENDOSCOPY;  Service: Pulmonary;;   BRONCHIAL BRUSHINGS  06/12/2021   Procedure: BRONCHIAL BRUSHINGS;  Surgeon: Josephine Igo, DO;  Location: [facility] ENDOS[facility];  Service: Pulmonary;;    BRONCHIAL WASHINGS  06/12/2021   Procedure: BRONCHIAL WASHINGS;  Surgeon: Josephine Igo, DO;  Location: [facility] ENDOS[facility];  Service: Pulmonary;;   CHOLECYSTECTOMY     COLON RESECTION     CRANIOTOMY N/A 07/11/2022   Procedure: Craniotomy for resection of tumor;  Surgeon: Bethann Goo, DO;  Location: [facility] OR;  Service: Neurosurgery;  Laterality: N/A;  RM 19   FIDUCIAL MARKER PLACEMENT  06/12/2021   Procedure: FIDUCIAL MARKER PLACEMENT;  Surgeon: Josephine Igo, DO;  Location: [facility] ENDOS[facility];  Service: Pulmonary;;   hernia     x 2   IR IMAGING GUIDED PORT INSERTION  10/11/2022   TONSILLECTOMY     VIDEO BRONCHOSCOPY WITH ENDOBRONCHIAL NAVIGATION Bilateral 06/12/2021   Procedure: VIDEO BRONCHOSCOPY WITH ENDOBRONCHIAL NAVIGATION;  Surgeon: Josephine Igo, DO;  Location: [facility];  Service: Pulmonary;  Laterality: Bilateral;  ION   VIDEO BRONCHOSCOPY WITH RADIAL ENDOBRONCHIAL ULTRASOUND  06/12/2021   Procedure: RADIAL ENDOBRONCHIAL ULTRASOUND;  Surgeon: Josephine Igo, DO;  Location: [facility];  Service: Pulmonary;;    Social History   Socioeconomic History   Marital status: Married    Spouse name: Ben   Number of children: 3   Years of education: Not on file   Highest education level: Not on file  Occupational History   Not on file  Tobacco Use   Smoking status: Former    Packs/day: 0.50    Years: 46.00    Additional pack years: 0.00    Total pack years: 23.00    Types: Cigarettes    Quit date: 06/10/2022    Years since quitting: 0.6    Passive exposure: Past   Smokeless tobacco: Never  Vaping Use   Vaping Use: Never used  Substance and Sexual Activity   Alcohol use: Not Currently   Drug use: Never   Sexual activity: Not on file  Other Topics Concern   Not on file  Social History Narrative   Not on file   Social Determinants of Health   Financial Resource Strain: Not on file  Food Insecurity: No Food Insecurity (09/06/2022)   Hunger Vital Sign    Worried About  Running Out of Food in the Last Year: Never true    Ran Out of Food in the Last Year: Never true  Transportation Needs: No Transportation Needs (09/06/2022)   PRAPARE - Administrator, Civil Service (Medical): No    Lack of Transportation (Non-Medical): No  Physical Activity: Not on file  Stress: Not on file  Social Connections: Not on file  Intimate Partner Violence: Not At Risk (09/06/2022)   Humiliation, Afraid, Rape, and Kick questionnaire    Fear of Current or Ex-Partner: No    Emotionally Abused: No    Physically Abused: No    Sexually Abused: No    Family History  Problem Relation Age of Onset   Lung cancer Mother        never smoker   Brain cancer Mother    Lung cancer Father        smoked   Stroke Brother      Current Outpatient Medications:    acetaminophen (TYLENOL) 500 MG tablet, Take 500 mg by mouth every 6 (six) hours as needed for mild pain or headache., Disp: , Rfl:    albuterol (VENTOLIN HFA) 108 (90 Base) MCG/ACT inhaler, Inhale 2 puffs into the lungs every 6 (six) hours as needed for wheezing or shortness of breath., Disp: 18 g, Rfl: 1   ALPRAZolam (XANAX) 0.25 MG tablet, Take 0.25 mg by mouth at bedtime as needed for anxiety., Disp: , Rfl:    apixaban (ELIQUIS) 5 MG TABS tablet, Take 1 tablet (5 mg total) by mouth 2 (two) times daily., Disp: 60 tablet, Rfl: 3   Budeson-Glycopyrrol-Formoterol (BREZTRI AEROSPHERE) 160-9-4.8 MCG/ACT AERO, INHALE 2 PUFFS BY MOUTH EVERY MORNING AND EVERY NIGHT AT BEDTIME (Patient taking differently: Inhale 2 puffs into the lungs in the morning and at bedtime.), Disp: 10.7 g, Rfl: 5   folic acid (FOLVITE) 1 MG tablet, Take 1 tablet (1 mg total) by mouth daily., Disp: 30 tablet, Rfl: 2   gabapentin (NEURONTIN) 100 MG capsule, Take 1 capsule (100 mg total) by mouth 2 (two) times daily., Disp: 90 capsule, Rfl: 2  guaiFENesin (MUCINEX) 600 MG 12 hr tablet, Take 1 tablet (600 mg total) by mouth 2 (two) times daily., Disp: , Rfl:     ipratropium-albuterol (DUONEB) 0.5-2.5 (3) MG/3ML SOLN, Take 3 mLs by nebulization every 6 (six) hours as needed., Disp: 360 mL, Rfl: 0   levocetirizine (XYZAL) 5 MG tablet, Take 5 mg by mouth at bedtime as needed (for seasonal allergies- if not taking generic Claritin)., Disp: , Rfl:    LORazepam (ATIVAN) 1 MG tablet, Take 1 tablet (1 mg total) by mouth as needed. 30 min Prior to MRI or brain radiation procedure (Patient not taking: Reported on 12/10/2022), Disp: 5 tablet, Rfl: 0   metFORMIN (GLUCOPHAGE-XR) 500 MG 24 hr tablet, Take 500 mg by mouth every morning., Disp: , Rfl:    nystatin (MYCOSTATIN/NYSTOP) powder, Apply 1 Application topically 3 (three) times daily. (Patient not taking: Reported on 12/10/2022), Disp: 30 g, Rfl: 0   ondansetron (ZOFRAN) 8 MG tablet, Take 1 tablet (8 mg total) by mouth every 8 (eight) hours as needed for nausea or vomiting. (Patient not taking: Reported on 12/10/2022), Disp: 30 tablet, Rfl: 2   OVER THE COUNTER MEDICATION, Apply 1 application  topically See admin instructions. Sonafine Wound Dressing Topical Emulsion- Apply to the back 1-2 times a day, Disp: , Rfl:    pantoprazole (PROTONIX) 20 MG tablet, Take 20 mg by mouth every evening., Disp: , Rfl:    telmisartan (MICARDIS) 20 MG tablet, Take 1 tablet (20 mg total) by mouth daily. NEED OV., Disp: 15 tablet, Rfl: 0   traMADol (ULTRAM) 50 MG tablet, Take 2 tablets (100 mg total) by mouth every 6 (six) hours as needed for severe pain. (Patient taking differently: Take 50 mg by mouth every 6 (six) hours as needed for severe pain.), Disp: 30 tablet, Rfl: 0   Zinc Acetate, Oral, (ZINC ACETATE PO), Take 1 tablet by mouth daily. Received from herbalist, Disp: , Rfl:   PHYSICAL EXAM: ECOG FS:1 - Symptomatic but completely ambulatory    Vitals:   01/29/23 1200  BP: (!) 143/57  Pulse: (!) 104  Resp: 17  Temp: 98.8 F (37.1 C)  TempSrc: Oral  SpO2: 94%  Weight: 218 lb 1.6 oz (98.9 kg)   Physical Exam Vitals and  nursing note reviewed.  Constitutional:      Appearance: She is not ill-appearing or toxic-appearing.  HENT:     Head: Normocephalic.     Right Ear: External ear normal.     Left Ear: External ear normal.     Mouth/Throat:     Mouth: Mucous membranes are moist.     Pharynx: Oropharynx is clear. No oropharyngeal exudate or posterior oropharyngeal erythema.  Eyes:     Conjunctiva/sclera: Conjunctivae normal.  Cardiovascular:     Rate and Rhythm: Normal rate and regular rhythm.     Pulses: Normal pulses.     Heart sounds: Normal heart sounds.     Comments: HR in the 90s during exam Pulmonary:     Effort: Pulmonary effort is normal.     Breath sounds: Normal breath sounds.  Chest:     Comments: PAC in right upper chest without signs of infection. Abdominal:     General: There is no distension.  Musculoskeletal:     Cervical back: Normal range of motion.     Right lower leg: 1+ Pitting Edema present.     Left lower leg: 1+ Pitting Edema present.  Skin:    General: Skin is warm and dry.  Neurological:     Mental Status: She is alert.        LABORATORY DATA: I have reviewed the data as listed    Latest Ref Rng & Units 01/29/2023   11:32 AM 01/21/2023   10:29 AM 01/15/2023   11:09 AM  CBC  WBC 4.0 - 10.5 K/uL 1.1  3.8  4.4   Hemoglobin 12.0 - 15.0 g/dL 96.0  45.4  09.8   Hematocrit 36.0 - 46.0 % 30.7  33.8  33.0   Platelets 150 - 400 K/uL 98  204  175         Latest Ref Rng & Units 01/29/2023   11:32 AM 01/21/2023   10:29 AM 01/15/2023   11:09 AM  CMP  Glucose 70 - 99 mg/dL 119  147  829   BUN 8 - 23 mg/dL 10  10  11    Creatinine 0.44 - 1.00 mg/dL 5.62  1.30  8.65   Sodium 135 - 145 mmol/L 137  138  140   Potassium 3.5 - 5.1 mmol/L 3.8  3.9  3.9   Chloride 98 - 111 mmol/L 103  104  106   CO2 22 - 32 mmol/L 28  27  28    Calcium 8.9 - 10.3 mg/dL 9.1  9.2  9.2   Total Protein 6.5 - 8.1 g/dL 6.5  6.5  6.3   Total Bilirubin 0.3 - 1.2 mg/dL 0.7  0.5  0.5   Alkaline  Phos 38 - 126 U/L 64  57  56   AST 15 - 41 U/L 30  18  26    ALT 0 - 44 U/L 33  25  42        RADIOGRAPHIC STUDIES (from last 24 hours if applicable) I have personally reviewed the radiological images as listed and agreed with the findings in the report. No results found.      Visit Diagnosis: 1. Malignant neoplasm of unspecified part of unspecified bronchus or lung   2. Other pancytopenia   3. Constipation, unspecified constipation type   4. Chronic deep vein thrombosis (DVT) of right lower extremity, unspecified vein      No orders of the defined types were placed in this encounter.   All questions were answered. The patient knows to call the clinic with any problems, questions or concerns. No barriers to learning was detected.  I have spent a total of 20 minutes minutes of face-to-face and non-face-to-face time, preparing to see the patient, obtaining and/or reviewing separately obtained history, performing a medically appropriate examination, counseling and educating the patient, ordering tests, documenting clinical information in the electronic health record, and care coordination (communications with other health care professionals or caregivers).    Thank you for allowing me to participate in the care of this patient.    Shanon Ace, PA-C Department of Hematology/Oncology [facility] Phone: [phone]  [phone]    01/29/2023 4:15 PM

## 2023-01-29 NOTE — Patient Instructions (Signed)
Filgrastim Injection What is this medication? FILGRASTIM (fil GRA stim) lowers the risk of infection in people who are receiving chemotherapy. It works by helping your body make more white blood cells, which protects your body from infection. It may also be used to help people who have been exposed to high doses of radiation. It can be used to help prepare your body before a stem cell transplant. It works by helping your bone marrow make and release stem cells into the blood. This medicine may be used for other purposes; ask your health care provider or pharmacist if you have questions. COMMON BRAND NAME(S): Neupogen, Nivestym, Releuko, Zarxio What should I tell my care team before I take this medication? They need to know if you have any of these conditions: History of blood diseases, such as sickle cell anemia Kidney disease Recent or ongoing radiation An unusual or allergic reaction to filgrastim, pegfilgrastim, latex, rubber, other medications, foods, dyes, or preservatives Pregnant or trying to get pregnant Breast-feeding How should I use this medication? This medication is injected under the skin or into a vein. It is usually given by your care team in a hospital or clinic setting. It may be given at home. If you get this medication at home, you will be taught how to prepare and give it. Use exactly as directed. Take it as directed on the prescription label at the same time every day. Keep taking it unless your care team tells you to stop. It is important that you put your used needles and syringes in a special sharps container. Do not put them in a trash can. If you do not have a sharps container, call your pharmacist or care team to get one. This medication comes with INSTRUCTIONS FOR USE. Ask your pharmacist for directions on how to use this medication. Read the information carefully. Talk to your pharmacist or care team if you have questions. Talk to your care team about the use of this  medication in children. While it may be prescribed for children for selected conditions, precautions do apply. Overdosage: If you think you have taken too much of this medicine contact a poison control center or emergency room at once. NOTE: This medicine is only for you. Do not share this medicine with others. What if I miss a dose? It is important not to miss any doses. Talk to your care team about what to do if you miss a dose. What may interact with this medication? Medications that may cause a release of neutrophils, such as lithium This list may not describe all possible interactions. Give your health care provider a list of all the medicines, herbs, non-prescription drugs, or dietary supplements you use. Also tell them if you smoke, drink alcohol, or use illegal drugs. Some items may interact with your medicine. What should I watch for while using this medication? Your condition will be monitored carefully while you are receiving this medication. You may need bloodwork while taking this medication. Talk to your care team about your risk of cancer. You may be more at risk for certain types of cancer if you take this medication. What side effects may I notice from receiving this medication? Side effects that you should report to your care team as soon as possible: Allergic reactions--skin rash, itching, hives, swelling of the face, lips, tongue, or throat Capillary leak syndrome--stomach or muscle pain, unusual weakness or fatigue, feeling faint or lightheaded, decrease in the amount of urine, swelling of the ankles, hands, or   feet, trouble breathing High white blood cell level--fever, fatigue, trouble breathing, night sweats, change in vision, weight loss Inflammation of the aorta--fever, fatigue, back, chest, or stomach pain, severe headache Kidney injury (glomerulonephritis)--decrease in the amount of urine, red or dark brown urine, foamy or bubbly urine, swelling of the ankles, hands, or  feet Shortness of breath or trouble breathing Spleen injury--pain in upper left stomach or shoulder Unusual bruising or bleeding Side effects that usually do not require medical attention (report to your care team if they continue or are bothersome): Back pain Bone pain Fatigue Fever Headache Nausea This list may not describe all possible side effects. Call your doctor for medical advice about side effects. You may report side effects to FDA at 1-800-FDA-1088. Where should I keep my medication? Keep out of the reach of children and pets. Keep this medication in the original packaging until you are ready to take it. Protect from light. See product for storage information. Each product may have different instructions. Get rid of any unused medication after the expiration date. To get rid of medications that are no longer needed or have expired: Take the medication to a medications take-back program. Check with your pharmacy or law enforcement to find a location. If you cannot return the medication, ask your pharmacist or care team how to get rid of this medication safely. NOTE: This sheet is a summary. It may not cover all possible information. If you have questions about this medicine, talk to your doctor, pharmacist, or health care provider.  2023 Elsevier/Gold Standard (2022-01-01 00:00:00)  

## 2023-01-29 NOTE — Telephone Encounter (Signed)
CRITICAL VALUE STICKER  CRITICAL VALUE: ANC 0.4  RECEIVER (on-site recipient of call): Kadi Hession P. LPN  DATE & TIME NOTIFIED: 01/29/23 1158 a  MESSENGER (representative from lab): Shanda Bumps  MD NOTIFIED: c. Heilingoetter, PA-C  TIME OF NOTIFICATION: 1158 a

## 2023-01-30 ENCOUNTER — Other Ambulatory Visit: Payer: Self-pay

## 2023-01-30 ENCOUNTER — Inpatient Hospital Stay: Payer: Medicare Other

## 2023-01-30 VITALS — BP 137/59 | HR 89 | Temp 98.7°F | Resp 20

## 2023-01-30 DIAGNOSIS — Z5112 Encounter for antineoplastic immunotherapy: Secondary | ICD-10-CM | POA: Diagnosis not present

## 2023-01-30 DIAGNOSIS — Z95828 Presence of other vascular implants and grafts: Secondary | ICD-10-CM

## 2023-01-30 DIAGNOSIS — C3490 Malignant neoplasm of unspecified part of unspecified bronchus or lung: Secondary | ICD-10-CM

## 2023-01-30 MED ORDER — FILGRASTIM-AAFI 480 MCG/0.8ML IJ SOSY
480.0000 ug | PREFILLED_SYRINGE | Freq: Once | INTRAMUSCULAR | Status: AC
  Administered 2023-01-30: 480 ug via SUBCUTANEOUS
  Filled 2023-01-30: qty 0.8

## 2023-01-31 ENCOUNTER — Inpatient Hospital Stay: Payer: Medicare Other

## 2023-01-31 VITALS — BP 141/52 | HR 93 | Temp 98.7°F | Resp 18

## 2023-01-31 DIAGNOSIS — Z5112 Encounter for antineoplastic immunotherapy: Secondary | ICD-10-CM | POA: Diagnosis not present

## 2023-01-31 DIAGNOSIS — C3490 Malignant neoplasm of unspecified part of unspecified bronchus or lung: Secondary | ICD-10-CM

## 2023-01-31 DIAGNOSIS — Z95828 Presence of other vascular implants and grafts: Secondary | ICD-10-CM

## 2023-01-31 MED ORDER — FILGRASTIM-AAFI 480 MCG/0.8ML IJ SOSY
480.0000 ug | PREFILLED_SYRINGE | Freq: Once | INTRAMUSCULAR | Status: AC
  Administered 2023-01-31: 480 ug via SUBCUTANEOUS
  Filled 2023-01-31: qty 0.8

## 2023-02-05 ENCOUNTER — Inpatient Hospital Stay (HOSPITAL_BASED_OUTPATIENT_CLINIC_OR_DEPARTMENT_OTHER): Payer: Medicare Other | Admitting: Internal Medicine

## 2023-02-05 ENCOUNTER — Inpatient Hospital Stay: Payer: Medicare Other | Attending: Internal Medicine

## 2023-02-05 VITALS — BP 152/59 | HR 103 | Temp 98.4°F | Resp 18 | Wt 215.4 lb

## 2023-02-05 DIAGNOSIS — R0602 Shortness of breath: Secondary | ICD-10-CM | POA: Diagnosis not present

## 2023-02-05 DIAGNOSIS — J90 Pleural effusion, not elsewhere classified: Secondary | ICD-10-CM | POA: Insufficient documentation

## 2023-02-05 DIAGNOSIS — R110 Nausea: Secondary | ICD-10-CM | POA: Diagnosis not present

## 2023-02-05 DIAGNOSIS — R0609 Other forms of dyspnea: Secondary | ICD-10-CM | POA: Insufficient documentation

## 2023-02-05 DIAGNOSIS — Z79899 Other long term (current) drug therapy: Secondary | ICD-10-CM | POA: Insufficient documentation

## 2023-02-05 DIAGNOSIS — D61810 Antineoplastic chemotherapy induced pancytopenia: Secondary | ICD-10-CM | POA: Diagnosis not present

## 2023-02-05 DIAGNOSIS — F419 Anxiety disorder, unspecified: Secondary | ICD-10-CM | POA: Diagnosis not present

## 2023-02-05 DIAGNOSIS — K439 Ventral hernia without obstruction or gangrene: Secondary | ICD-10-CM | POA: Diagnosis not present

## 2023-02-05 DIAGNOSIS — Z888 Allergy status to other drugs, medicaments and biological substances status: Secondary | ICD-10-CM | POA: Insufficient documentation

## 2023-02-05 DIAGNOSIS — M25473 Effusion, unspecified ankle: Secondary | ICD-10-CM | POA: Diagnosis not present

## 2023-02-05 DIAGNOSIS — T451X5A Adverse effect of antineoplastic and immunosuppressive drugs, initial encounter: Secondary | ICD-10-CM | POA: Diagnosis not present

## 2023-02-05 DIAGNOSIS — I700 Atherosclerosis of aorta: Secondary | ICD-10-CM | POA: Diagnosis not present

## 2023-02-05 DIAGNOSIS — M7989 Other specified soft tissue disorders: Secondary | ICD-10-CM | POA: Diagnosis not present

## 2023-02-05 DIAGNOSIS — Z86718 Personal history of other venous thrombosis and embolism: Secondary | ICD-10-CM | POA: Insufficient documentation

## 2023-02-05 DIAGNOSIS — Z5111 Encounter for antineoplastic chemotherapy: Secondary | ICD-10-CM | POA: Insufficient documentation

## 2023-02-05 DIAGNOSIS — Z9049 Acquired absence of other specified parts of digestive tract: Secondary | ICD-10-CM | POA: Insufficient documentation

## 2023-02-05 DIAGNOSIS — Z5112 Encounter for antineoplastic immunotherapy: Secondary | ICD-10-CM | POA: Diagnosis present

## 2023-02-05 DIAGNOSIS — M6281 Muscle weakness (generalized): Secondary | ICD-10-CM | POA: Insufficient documentation

## 2023-02-05 DIAGNOSIS — C7931 Secondary malignant neoplasm of brain: Secondary | ICD-10-CM | POA: Diagnosis not present

## 2023-02-05 DIAGNOSIS — E041 Nontoxic single thyroid nodule: Secondary | ICD-10-CM | POA: Diagnosis not present

## 2023-02-05 DIAGNOSIS — C3490 Malignant neoplasm of unspecified part of unspecified bronchus or lung: Secondary | ICD-10-CM | POA: Diagnosis not present

## 2023-02-05 DIAGNOSIS — R490 Dysphonia: Secondary | ICD-10-CM | POA: Diagnosis not present

## 2023-02-05 DIAGNOSIS — K760 Fatty (change of) liver, not elsewhere classified: Secondary | ICD-10-CM | POA: Insufficient documentation

## 2023-02-05 DIAGNOSIS — M47816 Spondylosis without myelopathy or radiculopathy, lumbar region: Secondary | ICD-10-CM | POA: Insufficient documentation

## 2023-02-05 DIAGNOSIS — C7932 Secondary malignant neoplasm of cerebral meninges: Secondary | ICD-10-CM

## 2023-02-05 DIAGNOSIS — Z95828 Presence of other vascular implants and grafts: Secondary | ICD-10-CM

## 2023-02-05 DIAGNOSIS — C3412 Malignant neoplasm of upper lobe, left bronchus or lung: Secondary | ICD-10-CM | POA: Insufficient documentation

## 2023-02-05 DIAGNOSIS — R5383 Other fatigue: Secondary | ICD-10-CM | POA: Diagnosis not present

## 2023-02-05 DIAGNOSIS — Z79631 Long term (current) use of antimetabolite agent: Secondary | ICD-10-CM | POA: Insufficient documentation

## 2023-02-05 DIAGNOSIS — Z87442 Personal history of urinary calculi: Secondary | ICD-10-CM | POA: Insufficient documentation

## 2023-02-05 DIAGNOSIS — Z7901 Long term (current) use of anticoagulants: Secondary | ICD-10-CM | POA: Insufficient documentation

## 2023-02-05 DIAGNOSIS — K30 Functional dyspepsia: Secondary | ICD-10-CM | POA: Insufficient documentation

## 2023-02-05 DIAGNOSIS — Z923 Personal history of irradiation: Secondary | ICD-10-CM | POA: Insufficient documentation

## 2023-02-05 DIAGNOSIS — Z79620 Long term (current) use of immunosuppressive biologic: Secondary | ICD-10-CM | POA: Insufficient documentation

## 2023-02-05 DIAGNOSIS — J449 Chronic obstructive pulmonary disease, unspecified: Secondary | ICD-10-CM | POA: Insufficient documentation

## 2023-02-05 DIAGNOSIS — Z880 Allergy status to penicillin: Secondary | ICD-10-CM | POA: Insufficient documentation

## 2023-02-05 LAB — CMP (CANCER CENTER ONLY)
ALT: 33 U/L (ref 0–44)
AST: 28 U/L (ref 15–41)
Albumin: 3.9 g/dL (ref 3.5–5.0)
Alkaline Phosphatase: 84 U/L (ref 38–126)
Anion gap: 6 (ref 5–15)
BUN: 11 mg/dL (ref 8–23)
CO2: 29 mmol/L (ref 22–32)
Calcium: 8.9 mg/dL (ref 8.9–10.3)
Chloride: 104 mmol/L (ref 98–111)
Creatinine: 0.63 mg/dL (ref 0.44–1.00)
GFR, Estimated: >60 mL/min (ref >60)
Glucose, Bld: 144 mg/dL — ABNORMAL HIGH (ref 70–99)
Potassium: 4 mmol/L (ref 3.5–5.1)
Sodium: 139 mmol/L (ref 135–145)
Total Bilirubin: 0.4 mg/dL (ref 0.3–1.2)
Total Protein: 6.4 g/dL — ABNORMAL LOW (ref 6.5–8.1)

## 2023-02-05 LAB — CBC WITH DIFFERENTIAL (CANCER CENTER ONLY)
Abs Immature Granulocytes: 0.26 10*3/uL — ABNORMAL HIGH (ref 0.00–0.07)
Basophils Absolute: 0 10*3/uL (ref 0.0–0.1)
Basophils Relative: 1 %
Eosinophils Absolute: 0.1 10*3/uL (ref 0.0–0.5)
Eosinophils Relative: 1 %
HCT: 33.3 % — ABNORMAL LOW (ref 36.0–46.0)
Hemoglobin: 10.7 g/dL — ABNORMAL LOW (ref 12.0–15.0)
Immature Granulocytes: 5 %
Lymphocytes Relative: 10 %
Lymphs Abs: 0.5 10*3/uL — ABNORMAL LOW (ref 0.7–4.0)
MCH: 34.5 pg — ABNORMAL HIGH (ref 26.0–34.0)
MCHC: 32.1 g/dL (ref 30.0–36.0)
MCV: 107.4 fL — ABNORMAL HIGH (ref 80.0–100.0)
Monocytes Absolute: 0.9 10*3/uL (ref 0.1–1.0)
Monocytes Relative: 16 %
Neutro Abs: 3.5 10*3/uL (ref 1.7–7.7)
Neutrophils Relative %: 67 %
Platelet Count: 145 10*3/uL — ABNORMAL LOW (ref 150–400)
RBC: 3.1 MIL/uL — ABNORMAL LOW (ref 3.87–5.11)
RDW: 16 % — ABNORMAL HIGH (ref 11.5–15.5)
WBC Count: 5.3 10*3/uL (ref 4.0–10.5)
nRBC: 0.6 % — ABNORMAL HIGH (ref 0.0–0.2)

## 2023-02-05 MED ORDER — HEPARIN SOD (PORK) LOCK FLUSH 100 UNIT/ML IV SOLN
500.0000 [IU] | Freq: Once | INTRAVENOUS | Status: AC
  Administered 2023-02-05: 500 [IU]

## 2023-02-05 MED ORDER — FUROSEMIDE 20 MG PO TABS: ORAL_TABLET | ORAL | 0 refills | Status: AC

## 2023-02-05 MED ORDER — SODIUM CHLORIDE 0.9% FLUSH
10.0000 mL | Freq: Once | INTRAVENOUS | Status: AC
  Administered 2023-02-05: 10 mL

## 2023-02-05 NOTE — Progress Notes (Signed)
[facility] [phone]   [phone]  OFFICE PROGRESS NOTE  Si Gaul, MD [address] [place] [address]  DIAGNOSIS: Stage IV (T2 a, N3, M1b) non-small cell lung cancer, adenocarcinoma presented with left upper lobe perihilar mass in addition to left hilar and mediastinal lymphadenopathy as well as left supraclavicular lymphadenopathy with brain metastasis diagnosed in September 2023.   [name]  Detected Alteration(s) / Biomarker(s) Associated FDA-approved therapies Clinical Trial Availability % cfDNA or Amplification  KRAS G12C approved by FDA Adagrasib, Sotorasib Yes 1.7%  TP53 R249S None Yes 1.5%  PRIOR THERAPY: 1) SRS the the metastatic brain lesion under the care of Dr. Basilio Cairo 2) Craniotomy on 07/10/22 under the care of Dr. Jake Samples 3) radiation to the chest under care of Dr. Roselind Messier.  Last dose on 08/23/2022   CURRENT THERAPY: Systemic chemotherapy with carboplatin for an AUC of 5, to 500 mg/m2, and Keytruda 200 mg IV every 3 weeks.  First dose expected on October 08, 2022.  Status post 6 cycles.  Starting from cycle #5 she will be on maintenance treatment with Alimta and Keytruda every 3 weeks.  INTERVAL HISTORY: [place] 82 y.o. female came to the clinic today as a walk-in visit for evaluation of her persistent fatigue and concern about her treatment with Eliquis.  She is think that Eliquis is causing her to be more tired and fatigue and also the cause of her pancytopenia which is likely secondary to her recent chemotherapy and not Eliquis.  She has some itching since starting Eliquis and the swelling in the lower extremity has not significantly improved.  The patient denied having any current chest pain but has shortness of breath at baseline and currently on home oxygen.  She denied having any current chest pain or hemoptysis.  She has no recent weight loss or night sweats.  She has no nausea, vomiting, diarrhea or  constipation.  She is celebrating her 82th birthday tomorrow and she would like to feel better.   MEDICAL HISTORY: Past Medical History:  Diagnosis Date   Anxiety    "had panic attacks years ago"   Asthma    COPD (chronic obstructive pulmonary disease) (HCC)    Goiter 2022   History of hiatal hernia    "dx in college, never bothered me"   History of kidney stones 2012   History of radiation therapy    Left Lung-07/10/22-08/23/22- Dr. Antony Blackbird   Hypertension    Lung cancer [facility])    Sleep apnea     ALLERGIES:  is allergic to compazine [prochlorperazine], amoxicillin, norvasc [amlodipine], and trelegy ellipta [fluticasone-umeclidin-vilant].  MEDICATIONS:  Current Outpatient Medications  Medication Sig Dispense Refill   acetaminophen (TYLENOL) 500 MG tablet Take 500 mg by mouth every 6 (six) hours as needed for mild pain or headache.     albuterol (VENTOLIN HFA) 108 (90 Base) MCG/ACT inhaler Inhale 2 puffs into the lungs every 6 (six) hours as needed for wheezing or shortness of breath. 18 g 1   ALPRAZolam (XANAX) 0.25 MG tablet Take 0.25 mg by mouth at bedtime as needed for anxiety.     apixaban (ELIQUIS) 5 MG TABS tablet Take 1 tablet (5 mg total) by mouth 2 (two) times daily. 60 tablet 3   Budeson-Glycopyrrol-Formoterol (BREZTRI AEROSPHERE) 160-9-4.8 MCG/ACT AERO INHALE 2 PUFFS BY MOUTH EVERY MORNING AND EVERY NIGHT AT BEDTIME (Patient taking differently: Inhale 2 puffs into the lungs in the morning and at bedtime.) 10.7  g 5   folic acid (FOLVITE) 1 MG tablet Take 1 tablet (1 mg total) by mouth daily. 30 tablet 2   gabapentin (NEURONTIN) 100 MG capsule Take 1 capsule (100 mg total) by mouth 2 (two) times daily. 90 capsule 2   guaiFENesin (MUCINEX) 600 MG 12 hr tablet Take 1 tablet (600 mg total) by mouth 2 (two) times daily.     ipratropium-albuterol (DUONEB) 0.5-2.5 (3) MG/3ML SOLN Take 3 mLs by nebulization every 6 (six) hours as needed. 360 mL 0   levocetirizine (XYZAL) 5 MG  tablet Take 5 mg by mouth at bedtime as needed (for seasonal allergies- if not taking generic Claritin).     LORazepam (ATIVAN) 1 MG tablet Take 1 tablet (1 mg total) by mouth as needed. 30 min Prior to MRI or brain radiation procedure (Patient not taking: Reported on 12/10/2022) 5 tablet 0   metFORMIN (GLUCOPHAGE-XR) 500 MG 24 hr tablet Take 500 mg by mouth every morning.     nystatin (MYCOSTATIN/NYSTOP) powder Apply 1 Application topically 3 (three) times daily. (Patient not taking: Reported on 12/10/2022) 30 g 0   ondansetron (ZOFRAN) 8 MG tablet Take 1 tablet (8 mg total) by mouth every 8 (eight) hours as needed for nausea or vomiting. (Patient not taking: Reported on 12/10/2022) 30 tablet 2   OVER THE COUNTER MEDICATION Apply 1 application  topically See admin instructions. Sonafine Wound Dressing Topical Emulsion- Apply to the back 1-2 times a day     pantoprazole (PROTONIX) 20 MG tablet Take 20 mg by mouth every evening.     telmisartan (MICARDIS) 20 MG tablet Take 1 tablet (20 mg total) by mouth daily. NEED OV. 15 tablet 0   traMADol (ULTRAM) 50 MG tablet Take 2 tablets (100 mg total) by mouth every 6 (six) hours as needed for severe pain. (Patient taking differently: Take 50 mg by mouth every 6 (six) hours as needed for severe pain.) 30 tablet 0   Zinc Acetate, Oral, (ZINC ACETATE PO) Take 1 tablet by mouth daily. Received from herbalist     No current facility-administered medications for this visit.    SURGICAL HISTORY:  Past Surgical History:  Procedure Laterality Date   APPENDECTOMY     APPLICATION OF CRANIAL NAVIGATION N/A 07/11/2022   Procedure: APPLICATION OF CRANIAL NAVIGATION;  Surgeon: Dawley, Alan Mulder, DO;  Location: [facility] OR;  Service: Neurosurgery;  Laterality: N/A;   BRONCHIAL BIOPSY  06/12/2021   Procedure: BRONCHIAL BIOPSIES;  Surgeon: Josephine Igo, DO;  Location: [facility] ENDOSCOPY;  Service: Pulmonary;;   BRONCHIAL BRUSHINGS  06/12/2021   Procedure: BRONCHIAL BRUSHINGS;  Surgeon:  Josephine Igo, DO;  Location: [facility];  Service: Pulmonary;;   BRONCHIAL WASHINGS  06/12/2021   Procedure: BRONCHIAL WASHINGS;  Surgeon: Josephine Igo, DO;  Location: [facility];  Service: Pulmonary;;   CHOLECYSTECTOMY     COLON RESECTION     CRANIOTOMY N/A 07/11/2022   Procedure: Craniotomy for resection of tumor;  Surgeon: Bethann Goo, DO;  Location: [facility] OR;  Service: Neurosurgery;  Laterality: N/A;  RM 19   FIDUCIAL MARKER PLACEMENT  06/12/2021   Procedure: FIDUCIAL MARKER PLACEMENT;  Surgeon: Josephine Igo, DO;  Location: [facility];  Service: Pulmonary;;   hernia     x 2   IR IMAGING GUIDED PORT INSERTION  10/11/2022   TONSILLECTOMY     VIDEO BRONCHOSCOPY WITH ENDOBRONCHIAL NAVIGATION Bilateral 06/12/2021   Procedure: VIDEO BRONCHOSCOPY WITH ENDOBRONCHIAL NAVIGATION;  Surgeon: Josephine Igo, DO;  Location: [facility];  Service: Pulmonary;  Laterality: Bilateral;  ION   VIDEO BRONCHOSCOPY WITH RADIAL ENDOBRONCHIAL ULTRASOUND  06/12/2021   Procedure: RADIAL ENDOBRONCHIAL ULTRASOUND;  Surgeon: Josephine Igo, DO;  Location: [facility];  Service: Pulmonary;;    REVIEW OF SYSTEMS:  Constitutional: positive for fatigue Eyes: negative Ears, nose, mouth, throat, and face: negative Respiratory: positive for dyspnea on exertion Cardiovascular: negative Gastrointestinal: negative Genitourinary:negative Integument/breast: negative Hematologic/lymphatic: negative Musculoskeletal:positive for muscle weakness Neurological: negative Behavioral/Psych: negative Endocrine: negative Allergic/Immunologic: negative   PHYSICAL EXAMINATION: General appearance: alert, cooperative, fatigued, and no distress Head: Normocephalic, without obvious abnormality, atraumatic Neck: no adenopathy, no JVD, supple, symmetrical, trachea midline, and thyroid not enlarged, symmetric, no tenderness/mass/nodules Lymph nodes: Cervical, supraclavicular, and axillary nodes normal. Resp: clear  to auscultation bilaterally Back: symmetric, no curvature. ROM normal. No CVA tenderness. Cardio: regular rate and rhythm, S1, S2 normal, no murmur, click, rub or gallop GI: soft, non-tender; bowel sounds normal; no masses,  no organomegaly Extremities: edema 1+ edema bilateral Neurologic: Alert and oriented X 3, normal strength and tone. Normal symmetric reflexes. Normal coordination and gait  ECOG PERFORMANCE STATUS: 1 - Symptomatic but completely ambulatory  Blood pressure (!) 152/59, pulse (!) 103, temperature 98.4 F (36.9 C), temperature source Temporal, resp. rate 18, weight 215 lb 6.4 oz (97.7 kg), SpO2 96 %.  LABORATORY DATA: Lab Results  Component Value Date   WBC 5.3 02/05/2023   HGB 10.7 (L) 02/05/2023   HCT 33.3 (L) 02/05/2023   MCV 107.4 (H) 02/05/2023   PLT 145 (L) 02/05/2023      Chemistry      Component Value Date/Time   NA 139 02/05/2023 1047   NA 139 09/17/2019 1550   K 4.0 02/05/2023 1047   CL 104 02/05/2023 1047   CO2 29 02/05/2023 1047   BUN 11 02/05/2023 1047   BUN 9 09/17/2019 1550   CREATININE 0.63 02/05/2023 1047      Component Value Date/Time   CALCIUM 8.9 02/05/2023 1047   ALKPHOS 84 02/05/2023 1047   AST 28 02/05/2023 1047   ALT 33 02/05/2023 1047   BILITOT 0.4 02/05/2023 1047       RADIOGRAPHIC STUDIES: MR Brain W Wo Contrast  Result Date: 01/27/2023 CLINICAL DATA:  Brain metastases, assess treatment response 3T SRS Protocol. EXAM: MRI HEAD WITHOUT AND WITH CONTRAST TECHNIQUE: Multiplanar, multiecho pulse sequences of the brain and surrounding structures were obtained without and with intravenous contrast. CONTRAST:  10 mL Vueway. COMPARISON:  MRI brain 10/10/2022 and older. FINDINGS: BRAIN New Lesions: None. Larger lesions: None. Stable or Smaller lesions: Interval resolution of the previously seen enhancing lesion in the right temporal lobe. Unchanged treated lesion along the posterior body of the left lateral ventricle (image 62 series  100). Other Brain findings: No acute infarct or hemorrhage. No hydrocephalus or extra-axial collection. Vascular: Normal flow voids and enhancement. Skull and upper cervical spine: Prior right occipital craniotomy. Sinuses/Orbits: Unremarkable. Other: None. IMPRESSION: Interval resolution of the previously seen enhancing lesion in the right temporal lobe. No evidence of new intracranial metastatic disease. Electronically Signed   By: Orvan Falconer M.D.   On: 01/27/2023 10:13    ASSESSMENT AND PLAN: This is a very pleasant 82 years old white female with likely stage IV (T2a, N3, M1B) non-small cell lung cancer, adenocarcinoma presented with left upper lobe perihilar mass in addition to left hilar and mediastinal lymphadenopathy as well as left supraclavicular lymphadenopathy and suspicious solitary brain metastasis diagnosed in September 2023.  The molecular studies showed positive KRAS G12C mutation The patient underwent SRS followed by craniotomy on July 10, 2022 to the solitary brain metastasis.  She also underwent palliative radiotherapy to the left upper lobe perihilar mass and mediastinal lymphadenopathy under the care of Dr. Roselind Messier. The patient is currently on systemic chemotherapy with carboplatin for AUC of 5, Alimta 500 Mg/M2 and Keytruda 200 Mg IV every 3 weeks.  First dose October 08, 2022.  Status post 6 cycles.  Starting from cycle #5 she will be on maintenance treatment with Alimta and Keytruda every 3 weeks. The patient has been tolerating this treatment well except for the fatigue and chemotherapy-induced pancytopenia.  She required treatment with filgrastim for few days. Repeat blood work today showed improvement of her total white blood count as well as the platelets. For the chronic deep venous thrombosis, I will strongly recommend for the patient to continue her treatment with Eliquis for at least total of 3 months.  She was also given the option of switching Eliquis to Xarelto or  Lovenox.  She will try to stay on Eliquis for now for at least 1 more months. She will come back for follow-up visit next week for evaluation before her next treatment. For the swelling of the lower extremities, I gave her prescription for Lasix 20 mg p.o. daily only for significant swelling and she was advised to eat potassium rich diet with the Lasix. For the lack of energy I advised the patient to take prednisone 10 mg p.o. twice daily for few days during her birthday celebration. The patient was advised to call immediately if she has any concerning symptoms in the interval. The patient voices understanding of current disease status and treatment options and is in agreement with the current care plan.  All questions were answered. The patient knows to call the clinic with any problems, questions or concerns. We can certainly see the patient much sooner if necessary.  The total time spent in the appointment was 35 minutes.  Disclaimer: This note was dictated with voice recognition software. Similar sounding words can inadvertently be transcribed and may not be corrected upon review.

## 2023-02-08 NOTE — Progress Notes (Unsigned)
[facility] OFFICE PROGRESS NOTE  Si Gaul, MD [address] [place] [address]  DIAGNOSIS: Stage IV (T2 a, N3, M1b) non-small cell lung cancer, adenocarcinoma presented with left upper lobe perihilar mass in addition to left hilar and mediastinal lymphadenopathy as well as left supraclavicular lymphadenopathy with brain metastasis diagnosed in September 2023.   [name]   Detected Alteration(s) / Biomarker(s)          Associated FDA-approved therapies  Clinical Trial Availability          % cfDNA or Amplification   KRAS G12C approved by FDA Adagrasib, Sotorasib Yes    1.7%   TP53 R249S None Yes          1.5%  PRIOR THERAPY: 1) SRS the the metastatic brain lesion under the care of Dr. Basilio Cairo 2) Craniotomy on 07/10/22 under the care of Dr. Jake Samples 3) radiation to the chest under care of Dr. Roselind Messier.  Last dose on 08/23/2022  CURRENT THERAPY: Systemic chemotherapy with carboplatin for an AUC of 5, to 500 mg/m2, and Keytruda 200 mg IV every 3 weeks.  First dose expected on October 08, 2022. Starting from cycle #5 she will be on maintenance treatment with Alimta and Keytruda every 3 weeks. Status post 6 cycles. Will reduce her dose of alimta to 400 mg/m2 secondary to intolerance   INTERVAL HISTORY: Laurie Mccoy 82 y.o. female returns to the clinic today for a follow-up visit accompanied by her husband.  The patient is currently undergoing palliative chemotherapy and immunotherapy.  He was seen last week by Dr. Arbutus Ped for walk-in visit.  She was endorsing fatigue at that time.  She also had some concerns regarding her Eliquis.  She felt that her Eliquis was causing her symptoms while is more likely her chemotherapy.  She has required some supportive care in the interval with G-CSF injections due to neutropenia.  Dr. Arbutus Ped offered to change her Eliquis to Xarelto or Lovenox but she declined.  We also gave her prednisone.  The prednisone helped her energy. Of course,  she cannot continue on prednisone long term secondary to her immunotherapy.  When she saw Dr. Arbutus Ped last week, she states he had mentioned possibly reducing the dose of her chemotherapy due to intolerance.    Today, She denies any fever or night sweats.  She has a strong appetite but lost a few pounds. She is on supplemental oxygen via nasal cannula. he follows with pulmonary medicine at [facility].  She states she is compliant with her inhalers and uses her nebulizer with only mild improvement. She has been endorsing dyspnea with mild activity for several weeks. She had stat CTA on 3/26 for the same concern which did not show any PE or acute findings. 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 Denies sore throat. She has some hoarseness in her voice. She had one episode of nausea in the interval since her last treatment which resolved. She denies any vomiting, diarrhea, or constipation.  She denies any headaches. She has chronic bilateral lower extremity ankle swelling which has been occurring for several months. She takes lasix 1 tablet daily PRN. She is compliant with her compression stockings. She is here today for evaluation repeat blood work before undergoing cycle #7   MEDICAL HISTORY: Past Medical History:  Diagnosis Date   Anxiety    "had panic attacks years ago"   Asthma    COPD (chronic obstructive pulmonary disease) (HCC)  Goiter 2022   History of hiatal hernia    "dx in college, never bothered me"   History of kidney stones 2012   History of radiation therapy    Left Lung-07/10/22-08/23/22- Dr. Antony Blackbird   Hypertension    Lung cancer [facility])    Sleep apnea     ALLERGIES:  is allergic to compazine [prochlorperazine], amoxicillin, norvasc [amlodipine], and trelegy ellipta [fluticasone-umeclidin-vilant].  MEDICATIONS:  Current Outpatient Medications  Medication Sig Dispense Refill   acetaminophen (TYLENOL) 500 MG tablet Take 500 mg by mouth every 6  (six) hours as needed for mild pain or headache.     albuterol (VENTOLIN HFA) 108 (90 Base) MCG/ACT inhaler Inhale 2 puffs into the lungs every 6 (six) hours as needed for wheezing or shortness of breath. 18 g 1   ALPRAZolam (XANAX) 0.25 MG tablet Take 0.25 mg by mouth at bedtime as needed for anxiety.     apixaban (ELIQUIS) 5 MG TABS tablet Take 1 tablet (5 mg total) by mouth 2 (two) times daily. 60 tablet 3   Budeson-Glycopyrrol-Formoterol (BREZTRI AEROSPHERE) 160-9-4.8 MCG/ACT AERO INHALE 2 PUFFS BY MOUTH EVERY MORNING AND EVERY NIGHT AT BEDTIME (Patient taking differently: Inhale 2 puffs into the lungs in the morning and at bedtime.) 10.7 g 5   folic acid (FOLVITE) 1 MG tablet Take 1 tablet (1 mg total) by mouth daily. 30 tablet 2   furosemide (LASIX) 20 MG tablet 1 tablet p.o. daily as needed for increased leg swelling. 20 tablet 0   gabapentin (NEURONTIN) 100 MG capsule Take 1 capsule (100 mg total) by mouth 2 (two) times daily. 90 capsule 2   guaiFENesin (MUCINEX) 600 MG 12 hr tablet Take 1 tablet (600 mg total) by mouth 2 (two) times daily.     ipratropium-albuterol (DUONEB) 0.5-2.5 (3) MG/3ML SOLN Take 3 mLs by nebulization every 6 (six) hours as needed. 360 mL 0   levocetirizine (XYZAL) 5 MG tablet Take 5 mg by mouth at bedtime as needed (for seasonal allergies- if not taking generic Claritin).     LORazepam (ATIVAN) 1 MG tablet Take 1 tablet (1 mg total) by mouth as needed. 30 min Prior to MRI or brain radiation procedure (Patient not taking: Reported on 12/10/2022) 5 tablet 0   metFORMIN (GLUCOPHAGE-XR) 500 MG 24 hr tablet Take 500 mg by mouth every morning.     nystatin (MYCOSTATIN/NYSTOP) powder Apply 1 Application topically 3 (three) times daily. (Patient not taking: Reported on 12/10/2022) 30 g 0   ondansetron (ZOFRAN) 8 MG tablet Take 1 tablet (8 mg total) by mouth every 8 (eight) hours as needed for nausea or vomiting. (Patient not taking: Reported on 12/10/2022) 30 tablet 2   OVER THE  COUNTER MEDICATION Apply 1 application  topically See admin instructions. Sonafine Wound Dressing Topical Emulsion- Apply to the back 1-2 times a day     pantoprazole (PROTONIX) 20 MG tablet Take 20 mg by mouth every evening.     telmisartan (MICARDIS) 20 MG tablet Take 1 tablet (20 mg total) by mouth daily. NEED OV. 15 tablet 0   traMADol (ULTRAM) 50 MG tablet Take 2 tablets (100 mg total) by mouth every 6 (six) hours as needed for severe pain. (Patient taking differently: Take 50 mg by mouth every 6 (six) hours as needed for severe pain.) 30 tablet 0   Zinc Acetate, Oral, (ZINC ACETATE PO) Take 1 tablet by mouth daily. Received from herbalist     No current facility-administered medications for this visit.  SURGICAL HISTORY:  Past Surgical History:  Procedure Laterality Date   APPENDECTOMY     APPLICATION OF CRANIAL NAVIGATION N/A 07/11/2022   Procedure: APPLICATION OF CRANIAL NAVIGATION;  Surgeon: Dawley, Alan Mulder, DO;  Location: [facility] OR;  Service: Neurosurgery;  Laterality: N/A;   BRONCHIAL BIOPSY  06/12/2021   Procedure: BRONCHIAL BIOPSIES;  Surgeon: Josephine Igo, DO;  Location: [facility];  Service: Pulmonary;;   BRONCHIAL BRUSHINGS  06/12/2021   Procedure: BRONCHIAL BRUSHINGS;  Surgeon: Josephine Igo, DO;  Location: [facility];  Service: Pulmonary;;   BRONCHIAL WASHINGS  06/12/2021   Procedure: BRONCHIAL WASHINGS;  Surgeon: Josephine Igo, DO;  Location: [facility];  Service: Pulmonary;;   CHOLECYSTECTOMY     COLON RESECTION     CRANIOTOMY N/A 07/11/2022   Procedure: Craniotomy for resection of tumor;  Surgeon: Bethann Goo, DO;  Location: [facility] OR;  Service: Neurosurgery;  Laterality: N/A;  RM 19   FIDUCIAL MARKER PLACEMENT  06/12/2021   Procedure: FIDUCIAL MARKER PLACEMENT;  Surgeon: Josephine Igo, DO;  Location: [facility];  Service: Pulmonary;;   hernia     x 2   IR IMAGING GUIDED PORT INSERTION  10/11/2022   TONSILLECTOMY     VIDEO BRONCHOSCOPY WITH  ENDOBRONCHIAL NAVIGATION Bilateral 06/12/2021   Procedure: VIDEO BRONCHOSCOPY WITH ENDOBRONCHIAL NAVIGATION;  Surgeon: Josephine Igo, DO;  Location: [facility];  Service: Pulmonary;  Laterality: Bilateral;  ION   VIDEO BRONCHOSCOPY WITH RADIAL ENDOBRONCHIAL ULTRASOUND  06/12/2021   Procedure: RADIAL ENDOBRONCHIAL ULTRASOUND;  Surgeon: Josephine Igo, DO;  Location: [facility];  Service: Pulmonary;;    REVIEW OF SYSTEMS:   Review of Systems  Constitutional: Positive for fatigue. Negative for appetite change, chills, fever and unexpected weight change.  HENT: Negative for mouth sores, nosebleeds, sore throat and trouble swallowing.   Eyes: Negative for eye problems and icterus.  Respiratory: Positive for shortness of breath with exertion. Negative for cough, hemoptysis, and wheezing.   Cardiovascular: Negative for chest pain. Positive for stable chronic bilateral lower extremity swelling.  Gastrointestinal: Negative for abdominal pain, constipation, diarrhea, nausea and vomiting.  Genitourinary: Negative for bladder incontinence, difficulty urinating, dysuria, frequency and hematuria.   Musculoskeletal: Negative for back pain, gait problem, neck pain and neck stiffness.  Skin: Negative for itching and rash.  Neurological: Negative for dizziness, extremity weakness, gait problem, headaches, light-headedness and seizures.  Hematological: Negative for adenopathy. Does not bruise/bleed easily.  Psychiatric/Behavioral: Negative for confusion, depression and sleep disturbance. The patient is not nervous/anxious.     PHYSICAL EXAMINATION:  Blood pressure (!) 145/58, pulse 98, temperature (!) 97.3 F (36.3 C), temperature source Oral, resp. rate 17, SpO2 97 %.  ECOG PERFORMANCE STATUS: 2  Physical Exam  Constitutional: Oriented to person, place, and time and well-developed, well-nourished, and in no distress.  HENT:  Head: Normocephalic and atraumatic.  Mouth/Throat: Oropharynx is  clear and moist. No oropharyngeal exudate.  Eyes: Conjunctivae are normal. Right eye exhibits no discharge. Left eye exhibits no discharge. No scleral icterus.  Neck: Normal range of motion. Neck supple.  Cardiovascular: Normal rate, regular rhythm, normal heart sounds and intact distal pulses.   Pulmonary/Chest: Effort normal and breath sounds normal. No respiratory distress. No wheezes. No rales. On supplemental oxygen.  Abdominal: Soft. Bowel sounds are normal. Exhibits no distension and no mass. There is no tenderness.  Musculoskeletal: Normal range of motion. Stable mild bilateral lower extremity edema.  Lymphadenopathy:    No cervical adenopathy.  Neurological: Alert and  oriented to person, place, and time. Exhibits also wasting.  Examined in the chair.  Skin: Skin is warm and dry. No rash noted. Not diaphoretic. No erythema. No pallor.  Psychiatric: Mood, memory and judgment normal.  Vitals reviewed.  LABORATORY DATA: Lab Results  Component Value Date   WBC 3.3 (L) 02/11/2023   HGB 10.9 (L) 02/11/2023   HCT 33.7 (L) 02/11/2023   MCV 107.3 (H) 02/11/2023   PLT 193 02/11/2023      Chemistry      Component Value Date/Time   NA 139 02/05/2023 1047   NA 139 09/17/2019 1550   K 4.0 02/05/2023 1047   CL 104 02/05/2023 1047   CO2 29 02/05/2023 1047   BUN 11 02/05/2023 1047   BUN 9 09/17/2019 1550   CREATININE 0.63 02/05/2023 1047      Component Value Date/Time   CALCIUM 8.9 02/05/2023 1047   ALKPHOS 84 02/05/2023 1047   AST 28 02/05/2023 1047   ALT 33 02/05/2023 1047   BILITOT 0.4 02/05/2023 1047       RADIOGRAPHIC STUDIES:  MR Brain W Wo Contrast  Result Date: 01/27/2023 CLINICAL DATA:  Brain metastases, assess treatment response 3T SRS Protocol. EXAM: MRI HEAD WITHOUT AND WITH CONTRAST TECHNIQUE: Multiplanar, multiecho pulse sequences of the brain and surrounding structures were obtained without and with intravenous contrast. CONTRAST:  10 mL Vueway. COMPARISON:   MRI brain 10/10/2022 and older. FINDINGS: BRAIN New Lesions: None. Larger lesions: None. Stable or Smaller lesions: Interval resolution of the previously seen enhancing lesion in the right temporal lobe. Unchanged treated lesion along the posterior body of the left lateral ventricle (image 62 series 100). Other Brain findings: No acute infarct or hemorrhage. No hydrocephalus or extra-axial collection. Vascular: Normal flow voids and enhancement. Skull and upper cervical spine: Prior right occipital craniotomy. Sinuses/Orbits: Unremarkable. Other: None. IMPRESSION: Interval resolution of the previously seen enhancing lesion in the right temporal lobe. No evidence of new intracranial metastatic disease. Electronically Signed   By: Orvan Falconer M.D.   On: 01/27/2023 10:13     ASSESSMENT/PLAN:  This is a very pleasant 82 year old Caucasian female with likely stage IV (T2a, N3, M1B) non-small cell lung cancer, adenocarcinoma presented with left upper lobe perihilar mass in addition to left hilar and mediastinal lymphadenopathy as well as left supraclavicular lymphadenopathy and a solitary brain metastasis diagnosed in September 2023. The molecular studies showed positive KRAS G12C mutation. Discussed this can be used in the second line setting in the future.    The patient underwent SRS and craniotomy under the care of Dr. Basilio Cairo and Dr. Jake Samples on 07/10/22. Dr. Jake Samples does not recommend any chemotherapy for at least 1 month from her surgery due to wound healing.    She is recently completed radiation to the chest under the care of Dr. Roselind Messier. The last day of radiation was scheduled for 08/23/22   The plan is to undergo systemic chemotherapy wit carboplatin for an AUC of 5, Alimta 500 mg/m, Keytruda 200 mg IV every 3 weeks. She is status post 6 cycles.  Starting from cycle #5, she started maintenance Alimta and Keytruda.    Labs reviewed.  I reviewed her doses with Dr. Arbutus Ped. Due to her intolerance, he  will reduce the dose of alimta to 400 mg/m2.   She will proceed with cycle #7 today's schedule.  I will arrange for restaging CT scan of chest, abdomen, pelvis prior to her next appointment to restage her condition and  assess treatment response. She has had several scans for her concern with dyspnea on exertion. She denies significant cough or sick contacts. She had CTA about 1 month ago for the same concern which did not show acute abnormality. Her oxygen saturation is normal without any hypotension, tachycardia, or fever. She is compliant with her eliquis. She will get a CXR today to assess for infection, progression, etc. She understands that there are some limitations with CXR. Also discussed it is sometimes hard to exclude infection in patients with prior chest radiotherapy due to scarring. She knows to be evaluated sooner if she notes any worsening respiratory symptoms in the interval.   We will see her back for follow-up visit in 3 weeks for evaluation repeat blood work before undergoing cycle #8.  She will continue on Eliquis.  Dr. Arbutus Ped talked her at lower last appointment about switching to Lovenox or Xarelto but she declined.  She will continue to follow with her pulmonary practice for her COPD.  She will continue her lasix PRN for swelling. She was instructed to increase her potassium intake on days she takes lasix or electrolyte drinks  The patient was advised to call immediately if she has any concerning symptoms in the interval. The patient voices understanding of current disease status and treatment options and is in agreement with the current care plan. All questions were answered. The patient knows to call the clinic with any problems, questions or concerns. We can certainly see the patient much sooner if necessary        Orders Placed This Encounter  Procedures   CT CHEST ABDOMEN PELVIS W CONTRAST    Standing Status:   Future    Standing Expiration Date:   02/11/2024     Order Specific Question:   If indicated for the ordered procedure, I authorize the administration of contrast media per Radiology protocol    Answer:   Yes    Order Specific Question:   Does the patient have a contrast media/X-ray dye allergy?    Answer:   No    Order Specific Question:   Preferred imaging location?    Answer:   [facility]    Order Specific Question:   If indicated for the ordered procedure, I authorize the administration of oral contrast media per Radiology protocol    Answer:   Yes   CBC with Differential (Cancer Center Only)    Standing Status:   Future    Standing Expiration Date:   04/14/2024   CMP (Cancer Center only)    Standing Status:   Future    Standing Expiration Date:   04/14/2024   CBC with Differential (Cancer Center Only)    Standing Status:   Future    Standing Expiration Date:   05/05/2024   CMP (Cancer Center only)    Standing Status:   Future    Standing Expiration Date:   05/05/2024     The total time spent in the appointment was 30-39 minutes.   Lysander Calixte L Rafiel Mecca, PA-C 02/11/23

## 2023-02-11 ENCOUNTER — Telehealth: Payer: Self-pay | Admitting: Medical Oncology

## 2023-02-11 ENCOUNTER — Inpatient Hospital Stay (HOSPITAL_BASED_OUTPATIENT_CLINIC_OR_DEPARTMENT_OTHER): Payer: Medicare Other | Admitting: Physician Assistant

## 2023-02-11 ENCOUNTER — Inpatient Hospital Stay: Payer: Medicare Other

## 2023-02-11 VITALS — BP 145/58 | HR 98 | Temp 97.3°F | Resp 17

## 2023-02-11 VITALS — BP 147/45 | HR 83

## 2023-02-11 DIAGNOSIS — C3490 Malignant neoplasm of unspecified part of unspecified bronchus or lung: Secondary | ICD-10-CM | POA: Diagnosis not present

## 2023-02-11 DIAGNOSIS — Z5112 Encounter for antineoplastic immunotherapy: Secondary | ICD-10-CM | POA: Diagnosis not present

## 2023-02-11 DIAGNOSIS — Z95828 Presence of other vascular implants and grafts: Secondary | ICD-10-CM

## 2023-02-11 LAB — CMP (CANCER CENTER ONLY)
ALT: 21 U/L (ref 0–44)
AST: 21 U/L (ref 15–41)
Albumin: 3.5 g/dL (ref 3.5–5.0)
Alkaline Phosphatase: 63 U/L (ref 38–126)
Anion gap: 8 (ref 5–15)
BUN: 14 mg/dL (ref 8–23)
CO2: 26 mmol/L (ref 22–32)
Calcium: 8.7 mg/dL — ABNORMAL LOW (ref 8.9–10.3)
Chloride: 103 mmol/L (ref 98–111)
Creatinine: 0.64 mg/dL (ref 0.44–1.00)
GFR, Estimated: >60 mL/min (ref >60)
Glucose, Bld: 144 mg/dL — ABNORMAL HIGH (ref 70–99)
Potassium: 4 mmol/L (ref 3.5–5.1)
Sodium: 137 mmol/L (ref 135–145)
Total Bilirubin: 0.8 mg/dL (ref 0.3–1.2)
Total Protein: 6.4 g/dL — ABNORMAL LOW (ref 6.5–8.1)

## 2023-02-11 LAB — CBC WITH DIFFERENTIAL (CANCER CENTER ONLY)
Abs Immature Granulocytes: 0.01 10*3/uL (ref 0.00–0.07)
Basophils Absolute: 0.1 10*3/uL (ref 0.0–0.1)
Basophils Relative: 2 %
Eosinophils Absolute: 0 10*3/uL (ref 0.0–0.5)
Eosinophils Relative: 1 %
HCT: 33.7 % — ABNORMAL LOW (ref 36.0–46.0)
Hemoglobin: 10.9 g/dL — ABNORMAL LOW (ref 12.0–15.0)
Immature Granulocytes: 0 %
Lymphocytes Relative: 13 %
Lymphs Abs: 0.4 10*3/uL — ABNORMAL LOW (ref 0.7–4.0)
MCH: 34.7 pg — ABNORMAL HIGH (ref 26.0–34.0)
MCHC: 32.3 g/dL (ref 30.0–36.0)
MCV: 107.3 fL — ABNORMAL HIGH (ref 80.0–100.0)
Monocytes Absolute: 0.3 10*3/uL (ref 0.1–1.0)
Monocytes Relative: 10 %
Neutro Abs: 2.4 10*3/uL (ref 1.7–7.7)
Neutrophils Relative %: 74 %
Platelet Count: 193 10*3/uL (ref 150–400)
RBC: 3.14 MIL/uL — ABNORMAL LOW (ref 3.87–5.11)
RDW: 15.9 % — ABNORMAL HIGH (ref 11.5–15.5)
WBC Count: 3.3 10*3/uL — ABNORMAL LOW (ref 4.0–10.5)
nRBC: 0 % (ref 0.0–0.2)

## 2023-02-11 MED ORDER — SODIUM CHLORIDE 0.9 % IV SOLN
200.0000 mg | Freq: Once | INTRAVENOUS | Status: AC
  Administered 2023-02-11: 200 mg via INTRAVENOUS
  Filled 2023-02-11: qty 200

## 2023-02-11 MED ORDER — SODIUM CHLORIDE 0.9% FLUSH
10.0000 mL | INTRAVENOUS | Status: DC | PRN
  Administered 2023-02-11: 10 mL

## 2023-02-11 MED ORDER — SODIUM CHLORIDE 0.9% FLUSH
10.0000 mL | Freq: Once | INTRAVENOUS | Status: AC
  Administered 2023-02-11: 10 mL

## 2023-02-11 MED ORDER — HEPARIN SOD (PORK) LOCK FLUSH 100 UNIT/ML IV SOLN
500.0000 [IU] | Freq: Once | INTRAVENOUS | Status: AC | PRN
  Administered 2023-02-11: 500 [IU]

## 2023-02-11 MED ORDER — SODIUM CHLORIDE 0.9 % IV SOLN: Freq: Once | INTRAVENOUS | Status: AC

## 2023-02-11 MED ORDER — ONDANSETRON HCL 4 MG/2ML IJ SOLN
8.0000 mg | Freq: Once | INTRAMUSCULAR | Status: AC
  Administered 2023-02-11: 8 mg via INTRAVENOUS
  Filled 2023-02-11: qty 4

## 2023-02-11 MED ORDER — SODIUM CHLORIDE 0.9 % IV SOLN
400.0000 mg/m2 | Freq: Once | INTRAVENOUS | Status: AC
  Administered 2023-02-11: 800 mg via INTRAVENOUS
  Filled 2023-02-11: qty 32

## 2023-02-11 NOTE — Patient Instructions (Signed)
[facility]  Discharge Instructions: Thank you for choosing [facility] to provide your oncology and hematology care.   If you have a lab appointment with the Cancer Center, please go directly to the Cancer Center and check in at the registration area.   Wear comfortable clothing and clothing appropriate for easy access to any Portacath or PICC line.   We strive to give you quality time with your provider. You may need to reschedule your appointment if you arrive late (15 or more minutes).  Arriving late affects you and other patients whose appointments are after yours.  Also, if you miss three or more appointments without notifying the office, you may be dismissed from the clinic at the provider's discretion.      For prescription refill requests, have your pharmacy contact our office and allow 72 hours for refills to be completed.    Today you received the following chemotherapy and/or immunotherapy agents: Keytruda, Alimta.       To help prevent nausea and vomiting after your treatment, we encourage you to take your nausea medication as directed.  BELOW ARE SYMPTOMS THAT SHOULD BE REPORTED IMMEDIATELY: *FEVER GREATER THAN 100.4 F (38 C) OR HIGHER *CHILLS OR SWEATING *NAUSEA AND VOMITING THAT IS NOT CONTROLLED WITH YOUR NAUSEA MEDICATION *UNUSUAL SHORTNESS OF BREATH *UNUSUAL BRUISING OR BLEEDING *URINARY PROBLEMS (pain or burning when urinating, or frequent urination) *BOWEL PROBLEMS (unusual diarrhea, constipation, pain near the anus) TENDERNESS IN MOUTH AND THROAT WITH OR WITHOUT PRESENCE OF ULCERS (sore throat, sores in mouth, or a toothache) UNUSUAL RASH, SWELLING OR PAIN  UNUSUAL VAGINAL DISCHARGE OR ITCHING   Items with * indicate a potential emergency and should be followed up as soon as possible or go to the Emergency Department if any problems should occur.  Please show the CHEMOTHERAPY ALERT CARD or IMMUNOTHERAPY ALERT CARD  at check-in to the Emergency Department and triage nurse.  Should you have questions after your visit or need to cancel or reschedule your appointment, please contact [facility]  Dept: [phone]  and follow the prompts.  Office hours are 8:00 a.m. to 4:30 p.m. Monday - Friday. Please note that voicemails left after 4:00 p.m. may not be returned until the following business day.  We are closed weekends and major holidays. You have access to a nurse at all times for urgent questions. Please call the main number to the clinic Dept: [phone] and follow the prompts.   For any non-urgent questions, you may also contact your provider using MyChart. We now offer e-Visits for anyone 18 and older to request care online for non-urgent symptoms. For details visit [email or web address].   Also download the MyChart app! Go to the app store, search "MyChart", open the app, select [facility], and log in with your MyChart username and password.   

## 2023-02-11 NOTE — Telephone Encounter (Signed)
She will do cxr tomorrow. "Nobody told me to go to get a cxr today".

## 2023-02-12 ENCOUNTER — Ambulatory Visit (HOSPITAL_COMMUNITY)
Admission: RE | Admit: 2023-02-12 | Discharge: 2023-02-12 | Disposition: A | Discharge status: Home / Self Care | Payer: Medicare Other | Source: Ambulatory Visit | Attending: Physician Assistant | Admitting: Physician Assistant

## 2023-02-12 ENCOUNTER — Telehealth: Payer: Self-pay | Admitting: Medical Oncology

## 2023-02-12 DIAGNOSIS — R0602 Shortness of breath: Secondary | ICD-10-CM | POA: Diagnosis not present

## 2023-02-12 NOTE — Telephone Encounter (Signed)
Per Dr. Arbutus Ped , I LVM with pt CXR impression.

## 2023-02-18 ENCOUNTER — Other Ambulatory Visit: Payer: Self-pay | Admitting: Radiation Therapy

## 2023-02-18 DIAGNOSIS — C7931 Secondary malignant neoplasm of brain: Secondary | ICD-10-CM

## 2023-02-19 ENCOUNTER — Inpatient Hospital Stay: Payer: Medicare Other

## 2023-02-28 ENCOUNTER — Telehealth: Payer: Self-pay | Admitting: Medical Oncology

## 2023-02-28 ENCOUNTER — Ambulatory Visit (HOSPITAL_COMMUNITY)
Admission: RE | Admit: 2023-02-28 | Discharge: 2023-02-28 | Disposition: A | Discharge status: Home / Self Care | Payer: Medicare Other | Source: Ambulatory Visit | Attending: Physician Assistant | Admitting: Physician Assistant

## 2023-02-28 DIAGNOSIS — C3490 Malignant neoplasm of unspecified part of unspecified bronchus or lung: Secondary | ICD-10-CM | POA: Insufficient documentation

## 2023-02-28 MED ORDER — IOHEXOL 300 MG/ML  SOLN
100.0000 mL | Freq: Once | INTRAMUSCULAR | Status: AC | PRN
  Administered 2023-02-28: 100 mL via INTRAVENOUS

## 2023-02-28 NOTE — Telephone Encounter (Signed)
Possible Eloquis-"overdose"  Pt called and said "I think I overdosed on my Eloquis.  I took 5 mg this morning with my morning meds and went to get my CT scan.   Later on when I felt in my pocket ,  there was an Eloquis so I took it . It may have been my evening dose. I just wanted someone to know".  I told [place] , do  not take another dose today and resume it in am and to call for any new uncontrolled bleeding. She voiced understanding.

## 2023-03-05 ENCOUNTER — Inpatient Hospital Stay: Payer: Medicare Other

## 2023-03-05 ENCOUNTER — Inpatient Hospital Stay (HOSPITAL_BASED_OUTPATIENT_CLINIC_OR_DEPARTMENT_OTHER): Payer: Medicare Other | Admitting: Internal Medicine

## 2023-03-05 ENCOUNTER — Encounter: Payer: Self-pay | Admitting: Medical Oncology

## 2023-03-05 DIAGNOSIS — Z5112 Encounter for antineoplastic immunotherapy: Secondary | ICD-10-CM | POA: Diagnosis not present

## 2023-03-05 DIAGNOSIS — Z95828 Presence of other vascular implants and grafts: Secondary | ICD-10-CM

## 2023-03-05 DIAGNOSIS — C3490 Malignant neoplasm of unspecified part of unspecified bronchus or lung: Secondary | ICD-10-CM

## 2023-03-05 LAB — CBC WITH DIFFERENTIAL (CANCER CENTER ONLY)
Abs Immature Granulocytes: 0.01 10*3/uL (ref 0.00–0.07)
Basophils Absolute: 0 10*3/uL (ref 0.0–0.1)
Basophils Relative: 1 %
Eosinophils Absolute: 0.1 10*3/uL (ref 0.0–0.5)
Eosinophils Relative: 2 %
HCT: 35.3 % — ABNORMAL LOW (ref 36.0–46.0)
Hemoglobin: 12 g/dL (ref 12.0–15.0)
Immature Granulocytes: 0 %
Lymphocytes Relative: 13 %
Lymphs Abs: 0.6 10*3/uL — ABNORMAL LOW (ref 0.7–4.0)
MCH: 35.6 pg — ABNORMAL HIGH (ref 26.0–34.0)
MCHC: 34 g/dL (ref 30.0–36.0)
MCV: 104.7 fL — ABNORMAL HIGH (ref 80.0–100.0)
Monocytes Absolute: 0.5 10*3/uL (ref 0.1–1.0)
Monocytes Relative: 12 %
Neutro Abs: 3.2 10*3/uL (ref 1.7–7.7)
Neutrophils Relative %: 72 %
Platelet Count: 194 10*3/uL (ref 150–400)
RBC: 3.37 MIL/uL — ABNORMAL LOW (ref 3.87–5.11)
RDW: 14.4 % (ref 11.5–15.5)
WBC Count: 4.4 10*3/uL (ref 4.0–10.5)
nRBC: 0 % (ref 0.0–0.2)

## 2023-03-05 LAB — CMP (CANCER CENTER ONLY)
ALT: 20 U/L (ref 0–44)
AST: 21 U/L (ref 15–41)
Albumin: 4.2 g/dL (ref 3.5–5.0)
Alkaline Phosphatase: 75 U/L (ref 38–126)
Anion gap: 6 (ref 5–15)
BUN: 10 mg/dL (ref 8–23)
CO2: 28 mmol/L (ref 22–32)
Calcium: 9.2 mg/dL (ref 8.9–10.3)
Chloride: 104 mmol/L (ref 98–111)
Creatinine: 0.59 mg/dL (ref 0.44–1.00)
GFR, Estimated: >60 mL/min (ref >60)
Glucose, Bld: 108 mg/dL — ABNORMAL HIGH (ref 70–99)
Potassium: 4.3 mmol/L (ref 3.5–5.1)
Sodium: 138 mmol/L (ref 135–145)
Total Bilirubin: 0.5 mg/dL (ref 0.3–1.2)
Total Protein: 6.9 g/dL (ref 6.5–8.1)

## 2023-03-05 MED ORDER — ONDANSETRON HCL 4 MG/2ML IJ SOLN
8.0000 mg | Freq: Once | INTRAMUSCULAR | Status: AC
  Administered 2023-03-05: 8 mg via INTRAVENOUS
  Filled 2023-03-05: qty 4

## 2023-03-05 MED ORDER — HEPARIN SOD (PORK) LOCK FLUSH 100 UNIT/ML IV SOLN
500.0000 [IU] | Freq: Once | INTRAVENOUS | Status: AC | PRN
  Administered 2023-03-05: 500 [IU]

## 2023-03-05 MED ORDER — SODIUM CHLORIDE 0.9 % IV SOLN
400.0000 mg/m2 | Freq: Once | INTRAVENOUS | Status: AC
  Administered 2023-03-05: 800 mg via INTRAVENOUS
  Filled 2023-03-05: qty 20

## 2023-03-05 MED ORDER — SODIUM CHLORIDE 0.9 % IV SOLN
200.0000 mg | Freq: Once | INTRAVENOUS | Status: AC
  Administered 2023-03-05: 200 mg via INTRAVENOUS
  Filled 2023-03-05: qty 200

## 2023-03-05 MED ORDER — SODIUM CHLORIDE 0.9% FLUSH
10.0000 mL | Freq: Once | INTRAVENOUS | Status: AC
  Administered 2023-03-05: 10 mL

## 2023-03-05 MED ORDER — SODIUM CHLORIDE 0.9 % IV SOLN: Freq: Once | INTRAVENOUS | Status: AC

## 2023-03-05 MED ORDER — SODIUM CHLORIDE 0.9% FLUSH
10.0000 mL | INTRAVENOUS | Status: DC | PRN
  Administered 2023-03-05: 10 mL

## 2023-03-05 NOTE — Progress Notes (Signed)
Patient seen by Dr. Mohamed  Vitals are within treatment parameters.  Labs reviewed: and are within treatment parameters.  Per physician team, patient is ready for treatment and there are NO modifications to the treatment plan.  

## 2023-03-05 NOTE — Progress Notes (Signed)
[facility] [phone]   [phone]  OFFICE PROGRESS NOTE  Si Gaul, MD [address] [place] [address]  DIAGNOSIS: Stage IV (T2 a, N3, M1b) non-small cell lung cancer, adenocarcinoma presented with left upper lobe perihilar mass in addition to left hilar and mediastinal lymphadenopathy as well as left supraclavicular lymphadenopathy with brain metastasis diagnosed in September 2023.   [name]  Detected Alteration(s) / Biomarker(s) Associated FDA-approved therapies Clinical Trial Availability % cfDNA or Amplification  KRAS G12C approved by FDA Adagrasib, Sotorasib Yes 1.7%  TP53 [id number] None Yes 1.5%  PRIOR THERAPY: 1) SRS the the metastatic brain lesion under the care of Dr. Basilio Cairo 2) Craniotomy on 07/10/22 under the care of Dr. Jake Samples 3) radiation to the chest under care of Dr. Roselind Messier.  Last dose on 08/23/2022   CURRENT THERAPY: Systemic chemotherapy with carboplatin for an AUC of 5, to 500 mg/m2, and Keytruda 200 mg IV every 3 weeks.  First dose expected on October 08, 2022.  Status post 7 cycles.  Starting from cycle #5 she will be on maintenance treatment with Alimta and Keytruda every 3 weeks.  INTERVAL HISTORY: Laurie Mccoy 82 y.o. female returns to the clinic today for follow-up visit accompanied by her husband.  The patient is feeling fine except for the fatigue and weakness in the lower extremities.  She has been complaining of some itching especially in the radiation port areas on the left anterior chest and back.  She also has some indigestion and feeling of electric impulse in the lower abdomen.  She denied having any chest pain but continues to have mild shortness of breath with exertion and cough with no hemoptysis.  She has no nausea, vomiting, diarrhea or constipation.  She has no headache or visual changes.  She continues to have swelling of the lower extremities.  She has been tolerating her treatment with  maintenance chemotherapy fairly well.  She is here today for evaluation with repeat CT scan of the chest, abdomen and pelvis performed recently for restaging of her disease.   MEDICAL HISTORY: Past Medical History:  Diagnosis Date   Anxiety    "had panic attacks years ago"   Asthma    COPD (chronic obstructive pulmonary disease) (HCC)    Goiter 2022   History of hiatal hernia    "dx in college, never bothered me"   History of kidney stones 2012   History of radiation therapy    Left Lung-07/10/22-08/23/22- Dr. Antony Blackbird   Hypertension    Lung cancer [facility])    Sleep apnea     ALLERGIES:  is allergic to compazine [prochlorperazine], amoxicillin, norvasc [amlodipine], and trelegy ellipta [fluticasone-umeclidin-vilant].  MEDICATIONS:  Current Outpatient Medications  Medication Sig Dispense Refill   acetaminophen (TYLENOL) 500 MG tablet Take 500 mg by mouth every 6 (six) hours as needed for mild pain or headache.     albuterol (VENTOLIN HFA) 108 (90 Base) MCG/ACT inhaler Inhale 2 puffs into the lungs every 6 (six) hours as needed for wheezing or shortness of breath. 18 g 1   ALPRAZolam (XANAX) 0.25 MG tablet Take 0.25 mg by mouth at bedtime as needed for anxiety.     apixaban (ELIQUIS) 5 MG TABS tablet Take 1 tablet (5 mg total) by mouth 2 (two) times daily. 60 tablet 3   Budeson-Glycopyrrol-Formoterol (BREZTRI AEROSPHERE) 160-9-4.8 MCG/ACT AERO INHALE 2 PUFFS BY MOUTH EVERY MORNING AND EVERY NIGHT AT BEDTIME (Patient taking differently: Inhale 2 puffs  into the lungs in the morning and at bedtime.) 10.7 g 5   folic acid (FOLVITE) 1 MG tablet Take 1 tablet (1 mg total) by mouth daily. 30 tablet 2   furosemide (LASIX) 20 MG tablet 1 tablet p.o. daily as needed for increased leg swelling. 20 tablet 0   gabapentin (NEURONTIN) 100 MG capsule Take 1 capsule (100 mg total) by mouth 2 (two) times daily. 90 capsule 2   guaiFENesin (MUCINEX) 600 MG 12 hr tablet Take 1 tablet (600 mg total) by  mouth 2 (two) times daily.     ipratropium-albuterol (DUONEB) 0.5-2.5 (3) MG/3ML SOLN Take 3 mLs by nebulization every 6 (six) hours as needed. 360 mL 0   levocetirizine (XYZAL) 5 MG tablet Take 5 mg by mouth at bedtime as needed (for seasonal allergies- if not taking generic Claritin).     LORazepam (ATIVAN) 1 MG tablet Take 1 tablet (1 mg total) by mouth as needed. 30 min Prior to MRI or brain radiation procedure (Patient not taking: Reported on 12/10/2022) 5 tablet 0   metFORMIN (GLUCOPHAGE-XR) 500 MG 24 hr tablet Take 500 mg by mouth every morning.     nystatin (MYCOSTATIN/NYSTOP) powder Apply 1 Application topically 3 (three) times daily. (Patient not taking: Reported on 12/10/2022) 30 g 0   ondansetron (ZOFRAN) 8 MG tablet Take 1 tablet (8 mg total) by mouth every 8 (eight) hours as needed for nausea or vomiting. (Patient not taking: Reported on 12/10/2022) 30 tablet 2   OVER THE COUNTER MEDICATION Apply 1 application  topically See admin instructions. Sonafine Wound Dressing Topical Emulsion- Apply to the back 1-2 times a day     pantoprazole (PROTONIX) 20 MG tablet Take 20 mg by mouth every evening.     telmisartan (MICARDIS) 20 MG tablet Take 1 tablet (20 mg total) by mouth daily. NEED OV. 15 tablet 0   traMADol (ULTRAM) 50 MG tablet Take 2 tablets (100 mg total) by mouth every 6 (six) hours as needed for severe pain. (Patient taking differently: Take 50 mg by mouth every 6 (six) hours as needed for severe pain.) 30 tablet 0   Zinc Acetate, Oral, (ZINC ACETATE PO) Take 1 tablet by mouth daily. Received from herbalist     No current facility-administered medications for this visit.    SURGICAL HISTORY:  Past Surgical History:  Procedure Laterality Date   APPENDECTOMY     APPLICATION OF CRANIAL NAVIGATION N/A 07/11/2022   Procedure: APPLICATION OF CRANIAL NAVIGATION;  Surgeon: Dawley, Alan Mulder, DO;  Location: [facility] OR;  Service: Neurosurgery;  Laterality: N/A;   BRONCHIAL BIOPSY  06/12/2021    Procedure: BRONCHIAL BIOPSIES;  Surgeon: Josephine Igo, DO;  Location: [facility] END[facility]COPY;  Service: Pulmonary;;   BRONCHIAL BRUSHINGS  06/12/2021   Procedure: BRONCHIAL BRUSHINGS;  Surgeon: Josephine Igo, DO;  Location: [facility];  Service: Pulmonary;;   BRONCHIAL WASHINGS  06/12/2021   Procedure: BRONCHIAL WASHINGS;  Surgeon: Josephine Igo, DO;  Location: [facility];  Service: Pulmonary;;   CHOLECYSTECTOMY     COLON RESECTION     CRANIOTOMY N/A 07/11/2022   Procedure: Craniotomy for resection of tumor;  Surgeon: Bethann Goo, DO;  Location: [facility] OR;  Service: Neurosurgery;  Laterality: N/A;  RM 19   FIDUCIAL MARKER PLACEMENT  06/12/2021   Procedure: FIDUCIAL MARKER PLACEMENT;  Surgeon: Josephine Igo, DO;  Location: [facility];  Service: Pulmonary;;   hernia     x 2   IR IMAGING GUIDED PORT INSERTION  10/11/2022   TONSILLECTOMY     VIDEO BRONCHOSCOPY WITH ENDOBRONCHIAL NAVIGATION Bilateral 06/12/2021   Procedure: VIDEO BRONCHOSCOPY WITH ENDOBRONCHIAL NAVIGATION;  Surgeon: Josephine Igo, DO;  Location: [facility];  Service: Pulmonary;  Laterality: Bilateral;  ION   VIDEO BRONCHOSCOPY WITH RADIAL ENDOBRONCHIAL ULTRASOUND  06/12/2021   Procedure: RADIAL ENDOBRONCHIAL ULTRASOUND;  Surgeon: Josephine Igo, DO;  Location: [facility];  Service: Pulmonary;;    REVIEW OF SYSTEMS:  Constitutional: positive for fatigue Eyes: negative Ears, nose, mouth, throat, and face: negative Respiratory: positive for dyspnea on exertion Cardiovascular: negative Gastrointestinal: negative Genitourinary:negative Integument/breast: negative Hematologic/lymphatic: negative Musculoskeletal:positive for muscle weakness Neurological: negative Behavioral/Psych: negative Endocrine: negative Allergic/Immunologic: negative   PHYSICAL EXAMINATION: General appearance: alert, cooperative, fatigued, and no distress Head: Normocephalic, without obvious abnormality, atraumatic Neck: no adenopathy,  no JVD, supple, symmetrical, trachea midline, and thyroid not enlarged, symmetric, no tenderness/mass/nodules Lymph nodes: Cervical, supraclavicular, and axillary nodes normal. Resp: clear to auscultation bilaterally Back: symmetric, no curvature. ROM normal. No CVA tenderness. Cardio: regular rate and rhythm, S1, S2 normal, no murmur, click, rub or gallop GI: soft, non-tender; bowel sounds normal; no masses,  no organomegaly Extremities: edema 1+ edema bilateral Neurologic: Alert and oriented X 3, normal strength and tone. Normal symmetric reflexes. Normal coordination and gait  ECOG PERFORMANCE STATUS: 1 - Symptomatic but completely ambulatory  Blood pressure (!) 155/77, pulse 86, temperature 98.6 F (37 C), temperature source Oral, resp. rate 18, weight 210 lb 4.8 oz (95.4 kg), SpO2 91 %.  LABORATORY DATA: Lab Results  Component Value Date   WBC 3.3 (L) 02/11/2023   HGB 10.9 (L) 02/11/2023   HCT 33.7 (L) 02/11/2023   MCV 107.3 (H) 02/11/2023   PLT 193 02/11/2023      Chemistry      Component Value Date/Time   NA 137 02/11/2023 1125   NA 139 09/17/2019 1550   K 4.0 02/11/2023 1125   CL 103 02/11/2023 1125   CO2 26 02/11/2023 1125   BUN 14 02/11/2023 1125   BUN 9 09/17/2019 1550   CREATININE 0.64 02/11/2023 1125      Component Value Date/Time   CALCIUM 8.7 (L) 02/11/2023 1125   ALKPHOS 63 02/11/2023 1125   AST 21 02/11/2023 1125   ALT 21 02/11/2023 1125   BILITOT 0.8 02/11/2023 1125       RADIOGRAPHIC STUDIES: CT CHEST ABDOMEN PELVIS W CONTRAST  Result Date: 03/05/2023 CLINICAL DATA:  Stage IV lung cancer with increasing shortness of breath and hoarseness. * Tracking Code: BO * EXAM: CT CHEST, ABDOMEN, AND PELVIS WITH CONTRAST TECHNIQUE: Multidetector CT imaging of the chest, abdomen and pelvis was performed following the standard protocol during bolus administration of intravenous contrast. RADIATION DOSE REDUCTION: This exam was performed according to the  departmental dose-optimization program which includes automated exposure control, adjustment of the mA and/or kV according to patient size and/or use of iterative reconstruction technique. CONTRAST:  OMNIPAQUE IOHEXOL 300 MG/ML  SOLN COMPARISON:  Chest CTA 12/31/2022. CT of the chest, abdomen and pelvis 12/06/2022. FINDINGS: CT CHEST FINDINGS Cardiovascular: No acute vascular findings are demonstrated. Right IJ Port-A-Cath extends to the superior cavoatrial junction. There is atherosclerosis of the aorta, great vessels and coronary arteries. The heart size is normal. There is no pericardial effusion. Mediastinum/Nodes: There are no enlarged mediastinal, hilar or axillary lymph nodes. Unchanged non incidental 2.2 cm left thyroid nodule on image 6/2, not hypermetabolic on prior PET-CT. The esophagus appears unremarkable. Lungs/Pleura: A small left pleural effusion  has mildly increased in volume. No significant right pleural effusion or pneumothorax. The nodule posteriorly in the left upper lobe measures 1.4 x 1.0 cm on image 43/4 (previously 1.9 x 1.1 cm). Surrounding treatment changes and volume loss in the left upper lobe are similar to the previous study. Likewise, the part solid right upper lobe nodule and associated fiducial markers are similar, measuring approximately 1.5 cm on image 53/4. No new or enlarging nodules are identified. Musculoskeletal/Chest wall: No chest wall mass or suspicious osseous findings. CT ABDOMEN AND PELVIS FINDINGS Hepatobiliary: Subjective mild hepatic steatosis without focal lesion or abnormal enhancement. No significant biliary dilatation status post cholecystectomy. Pancreas: Unremarkable. No pancreatic ductal dilatation or surrounding inflammatory changes. Spleen: Normal in size without focal abnormality. Adrenals/Urinary Tract: Both adrenal glands appear normal. No evidence of urinary tract calculus, suspicious renal lesion or hydronephrosis. Stable small left renal cysts  for which no follow-up imaging is recommended. The bladder appears unremarkable for its degree of distention. Stomach/Bowel: No enteric contrast administered. The stomach appears unremarkable for its degree of distension. No evidence of bowel wall thickening, distention or surrounding inflammatory change. Persistent extension of a portion of the transverse colon into a ventral abdominal wall hernia. No evidence of incarceration or obstruction. Unchanged distal colonic anastomosis. Vascular/Lymphatic: There are no enlarged abdominal or pelvic lymph nodes. Aortic and branch vessel atherosclerosis dense of aneurysm or large vessel occlusion. Reproductive: Unchanged low-density prominence of the central uterus. No adnexal mass. Other: Stable ventral abdominal wall hernias, including a right periumbilical component containing transverse colon. No ascites or peritoneal nodularity. No pneumoperitoneum. Probable sebaceous cyst superiorly in the left buttocks measuring 3.3 cm on image 92/2, minimally enlarged from previous CT. Musculoskeletal: No acute or significant osseous findings. Lumbar facet arthropathy. IMPRESSION: 1. No acute findings or explanation for shortness of breath or progressive hoarseness. 2. The dominant left upper lobe pulmonary nodule has mildly decreased in size with surrounding treatment changes. The part solid right upper lobe nodule is unchanged. 3. No evidence of progressive metastatic disease. 4. Small left pleural effusion has mildly increased in volume. 5. Stable incidental findings in the abdomen, including ventral abdominal wall hernias, and possible distension of the endometrial cavity, as previously described. 6. Stable left thyroid nodule, not hypermetabolic on prior PET-CT. Consider thyroid ultrasound if not previously performed. (Ref: J Am Coll Radiol. 2015 Feb;12(2): 143-50). 7.  Aortic Atherosclerosis (ICD10-I70.0). Electronically Signed   By: Carey Bullocks M.D.   On: 03/05/2023 12:20    DG Chest 2 View  Result Date: 02/12/2023 CLINICAL DATA:  Lung cancer EXAM: CHEST - 2 VIEW COMPARISON:  Chest x-ray dated January 02, 2023 FINDINGS: Cardiac and mediastinal contours are within normal limits. Right chest wall port is unchanged in position. Unchanged consolidation of the left upper lobe, likely posttreatment change. No pleural effusion or pneumothorax. IMPRESSION: Unchanged consolidation of the left upper lobe, likely posttreatment change. No evidence of acute airspace opacity. Electronically Signed   By: Allegra Lai M.D.   On: 02/12/2023 13:15    ASSESSMENT AND PLAN: This is a very pleasant 82 years old white female with likely stage IV (T2a, N3, M1B) non-small cell lung cancer, adenocarcinoma presented with left upper lobe perihilar mass in addition to left hilar and mediastinal lymphadenopathy as well as left supraclavicular lymphadenopathy and suspicious solitary brain metastasis diagnosed in September 2023. The molecular studies showed positive KRAS G12C mutation The patient underwent SRS followed by craniotomy on July 10, 2022 to the solitary brain metastasis.  She  also underwent palliative radiotherapy to the left upper lobe perihilar mass and mediastinal lymphadenopathy under the care of Dr. Roselind Messier. The patient is currently on systemic chemotherapy with carboplatin for AUC of 5, Alimta 500 Mg/M2 and Keytruda 200 Mg IV every 3 weeks.  First dose October 08, 2022.  Status post 7 cycles.  Starting from cycle #5 she will be on maintenance treatment with Alimta and Keytruda every 3 weeks. The patient has been tolerating her treatment well with no concerning adverse effects. She had repeat CT scan of the chest, abdomen and pelvis but the final report was not available at the time of the visit and I personally and independently reviewed the scan and did not see any evidence for disease progression and there was some improvement in the left lung airspace opacity. I recommended for the  patient to continue her current treatment with maintenance Alimta and Keytruda every 3 weeks. For the history of deep venous thrombosis, she will continue her current treatment with Eliquis for now. For the swelling of the lower extremities she has Lasix on as-needed basis but she does not take it a lot.  I will refer the patient to vascular surgery for evaluation of her persistent swelling. I will see her back for follow-up visit in 3 weeks for evaluation before starting cycle #9. The patient was advised to call immediately if she has any other concerning symptoms in the interval.  The patient voices understanding of current disease status and treatment options and is in agreement with the current care plan.  All questions were answered. The patient knows to call the clinic with any problems, questions or concerns. We can certainly see the patient much sooner if necessary.  The total time spent in the appointment was 35 minutes.  Disclaimer: This note was dictated with voice recognition software. Similar sounding words can inadvertently be transcribed and may not be corrected upon review.

## 2023-03-05 NOTE — Patient Instructions (Signed)
[facility]  Discharge Instructions: Thank you for choosing [facility] to provide your oncology and hematology care.   If you have a lab appointment with the Cancer Center, please go directly to the Cancer Center and check in at the registration area.   Wear comfortable clothing and clothing appropriate for easy access to any Portacath or PICC line.   We strive to give you quality time with your provider. You may need to reschedule your appointment if you arrive late (15 or more minutes).  Arriving late affects you and other patients whose appointments are after yours.  Also, if you miss three or more appointments without notifying the office, you may be dismissed from the clinic at the provider's discretion.      For prescription refill requests, have your pharmacy contact our office and allow 72 hours for refills to be completed.    Today you received the following chemotherapy and/or immunotherapy agents: Keytruda and Alimta      To help prevent nausea and vomiting after your treatment, we encourage you to take your nausea medication as directed.  BELOW ARE SYMPTOMS THAT SHOULD BE REPORTED IMMEDIATELY: *FEVER GREATER THAN 100.4 F (38 C) OR HIGHER *CHILLS OR SWEATING *NAUSEA AND VOMITING THAT IS NOT CONTROLLED WITH YOUR NAUSEA MEDICATION *UNUSUAL SHORTNESS OF BREATH *UNUSUAL BRUISING OR BLEEDING *URINARY PROBLEMS (pain or burning when urinating, or frequent urination) *BOWEL PROBLEMS (unusual diarrhea, constipation, pain near the anus) TENDERNESS IN MOUTH AND THROAT WITH OR WITHOUT PRESENCE OF ULCERS (sore throat, sores in mouth, or a toothache) UNUSUAL RASH, SWELLING OR PAIN  UNUSUAL VAGINAL DISCHARGE OR ITCHING   Items with * indicate a potential emergency and should be followed up as soon as possible or go to the Emergency Department if any problems should occur.  Please show the CHEMOTHERAPY ALERT CARD or IMMUNOTHERAPY ALERT  CARD at check-in to the Emergency Department and triage nurse.  Should you have questions after your visit or need to cancel or reschedule your appointment, please contact [facility]  Dept: [phone]  and follow the prompts.  Office hours are 8:00 a.m. to 4:30 p.m. Monday - Friday. Please note that voicemails left after 4:00 p.m. may not be returned until the following business day.  We are closed weekends and major holidays. You have access to a nurse at all times for urgent questions. Please call the main number to the clinic Dept: [phone] and follow the prompts.   For any non-urgent questions, you may also contact your provider using MyChart. We now offer e-Visits for anyone 18 and older to request care online for non-urgent symptoms. For details visit [email or web address].   Also download the MyChart app! Go to the app store, search "MyChart", open the app, select [place], and log in with your MyChart username and password.   

## 2023-03-10 ENCOUNTER — Other Ambulatory Visit: Payer: Self-pay | Admitting: *Deleted

## 2023-03-10 DIAGNOSIS — M7989 Other specified soft tissue disorders: Secondary | ICD-10-CM

## 2023-03-11 ENCOUNTER — Other Ambulatory Visit: Payer: Self-pay

## 2023-03-17 ENCOUNTER — Ambulatory Visit (HOSPITAL_COMMUNITY)
Admission: RE | Admit: 2023-03-17 | Discharge: 2023-03-17 | Disposition: A | Discharge status: Home / Self Care | Payer: Medicare Other | Source: Ambulatory Visit | Attending: Surgery | Admitting: Surgery

## 2023-03-17 DIAGNOSIS — M7989 Other specified soft tissue disorders: Secondary | ICD-10-CM | POA: Diagnosis present

## 2023-03-18 ENCOUNTER — Other Ambulatory Visit: Payer: Self-pay | Admitting: Internal Medicine

## 2023-03-19 ENCOUNTER — Telehealth: Payer: Self-pay

## 2023-03-19 ENCOUNTER — Ambulatory Visit (INDEPENDENT_AMBULATORY_CARE_PROVIDER_SITE_OTHER): Payer: Medicare Other | Admitting: Physician Assistant

## 2023-03-19 VITALS — BP 145/82 | HR 103 | Temp 98.3°F | Resp 16 | Ht 66.0 in | Wt 209.0 lb

## 2023-03-19 DIAGNOSIS — I890 Lymphedema, not elsewhere classified: Secondary | ICD-10-CM

## 2023-03-19 DIAGNOSIS — I872 Venous insufficiency (chronic) (peripheral): Secondary | ICD-10-CM

## 2023-03-19 NOTE — Telephone Encounter (Signed)
Pt called to let APP she saw today know that she used red light therapy over her legs the other day and it significantly reduced swelling. I have passed this along to APP. She was not very familiar with this form of therapy, which I let pt know. Pt had no questions, just wanted [place] to be aware of this.

## 2023-03-19 NOTE — Progress Notes (Addendum)
[facility] [place]   Reason for referral: Swollen right leg/ankle/foot  History of Present Illness  Laurie Mccoy is a 82 y.o. female who presents with chief complaint: swollen leg.  Patient notes, onset of swelling >24 months ago, associated with inactivity.  The patient has had a right popliteal history of DVT sometime in the past 2 years of the popliteal vein, no history of varicose vein, no history of venous stasis ulcers, no history of  Lymphedema and no history of skin changes in lower legs.  There is unknown family history of venous disorders.  The patient has  used compression stockings in the past and Lasix to assist with the edema.   She has had edema in the right LE for > 2 years and now it has moved into the ankle and foot.  She has been on Chemo daily and will be on it for the rest of her life.  Due to her treatments she is sedentary.  Past medical history includes Brain CA, Lung CA, DM, COPD.  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  Past Medical History:  Diagnosis Date   Anxiety    "had panic attacks years ago"   Asthma    COPD (chronic obstructive pulmonary disease) (HCC)    Goiter 2022   History of hiatal hernia    "dx in college, never bothered me"   History of kidney stones 2012   History of radiation therapy    Left Lung-07/10/22-08/23/22- Dr. Antony Blackbird   Hypertension    Lung cancer [facility])    Sleep apnea     Past Surgical History:  Procedure Laterality Date   APPENDECTOMY     APPLICATION OF CRANIAL NAVIGATION N/A 07/11/2022   Procedure: APPLICATION OF CRANIAL NAVIGATION;  Surgeon: Bethann Goo, DO;  Location: [facility] OR;  Service: Neurosurgery;  Laterality: N/A;   BRONCHIAL BIOPSY  06/12/2021   Procedure: BRONCHIAL BIOPSIES;  Surgeon: Josephine Igo, DO;  Location: [facility] ENDOSCOPY;  Service: Pulmonary;;   BRONCHIAL BRUSHINGS  06/12/2021   Procedure: BRONCHIAL BRUSHINGS;  Surgeon: Josephine Igo, DO;  Location: [facility]  ENDOSCOPY;  Service: Pulmonary;;   BRONCHIAL WASHINGS  06/12/2021   Procedure: BRONCHIAL WASHINGS;  Surgeon: Josephine Igo, DO;  Location: [facility] ENDOSCOPY;  Service: Pulmonary;;   CHOLECYSTECTOMY     COLON RESECTION     CRANIOTOMY N/A 07/11/2022   Procedure: Craniotomy for resection of tumor;  Surgeon: Bethann Goo, DO;  Location: [facility] OR;  Service: Neurosurgery;  Laterality: N/A;  RM 19   FIDUCIAL MARKER PLACEMENT  06/12/2021   Procedure: FIDUCIAL MARKER PLACEMENT;  Surgeon: Josephine Igo, DO;  Location: [facility] ENDOSCOPY;  Service: Pulmonary;;   hernia     x 2   IR IMAGING GUIDED PORT INSERTION  10/11/2022   TONSILLECTOMY     VIDEO BRONCHOSCOPY WITH ENDOBRONCHIAL NAVIGATION Bilateral 06/12/2021   Procedure: VIDEO BRONCHOSCOPY WITH ENDOBRONCHIAL NAVIGATION;  Surgeon: Josephine Igo, DO;  Location: [facility] ENDOSCOPY;  Service: Pulmonary;  Laterality: Bilateral;  ION   VIDEO BRONCHOSCOPY WITH RADIAL ENDOBRONCHIAL ULTRASOUND  06/12/2021   Procedure: RADIAL ENDOBRONCHIAL ULTRASOUND;  Surgeon: Josephine Igo, DO;  Location: [facility] ENDOSCOPY;  Service: Pulmonary;;    Social History   Socioeconomic History   Marital status: Married    Spouse name: Ben   Number of children: 3   Years of education: Not on file   Highest education level: Not on file  Occupational History  Not on file  Tobacco Use   Smoking status: Former    Packs/day: 0.50    Years: 46.00    Additional pack years: 0.00    Total pack years: 23.00    Types: Cigarettes    Quit date: 06/10/2022    Years since quitting: 0.7    Passive exposure: Past   Smokeless tobacco: Never  Vaping Use   Vaping Use: Never used  Substance and Sexual Activity   Alcohol use: Not Currently   Drug use: Never   Sexual activity: Not on file  Other Topics Concern   Not on file  Social History Narrative   Not on file   Social Determinants of Health   Financial Resource Strain: Not on file  Food Insecurity: No Food Insecurity (09/06/2022)    Hunger Vital Sign    Worried About Running Out of Food in the Last Year: Never true    Ran Out of Food in the Last Year: Never true  Transportation Needs: No Transportation Needs (09/06/2022)   PRAPARE - Administrator, Civil Service (Medical): No    Lack of Transportation (Non-Medical): No  Physical Activity: Not on file  Stress: Not on file  Social Connections: Not on file  Intimate Partner Violence: Not At Risk (09/06/2022)   Humiliation, Afraid, Rape, and Kick questionnaire    Fear of Current or Ex-Partner: No    Emotionally Abused: No    Physically Abused: No    Sexually Abused: No    Family History  Problem Relation Age of Onset   Lung cancer Mother        never smoker   Brain cancer Mother    Lung cancer Father        smoked   Stroke Brother     Current Outpatient Medications on File Prior to Visit  Medication Sig Dispense Refill   albuterol (VENTOLIN HFA) 108 (90 Base) MCG/ACT inhaler Inhale 2 puffs into the lungs every 6 (six) hours as needed for wheezing or shortness of breath. 18 g 1   ALPRAZolam (XANAX) 0.25 MG tablet Take 0.25 mg by mouth at bedtime as needed for anxiety.     apixaban (ELIQUIS) 5 MG TABS tablet Take 1 tablet (5 mg total) by mouth 2 (two) times daily. 60 tablet 3   Budeson-Glycopyrrol-Formoterol (BREZTRI AEROSPHERE) 160-9-4.8 MCG/ACT AERO INHALE 2 PUFFS BY MOUTH EVERY MORNING AND EVERY NIGHT AT BEDTIME (Patient taking differently: Inhale 2 puffs into the lungs in the morning and at bedtime.) 10.7 g 5   folic acid (FOLVITE) 1 MG tablet Take 1 tablet (1 mg total) by mouth daily. 30 tablet 2   gabapentin (NEURONTIN) 100 MG capsule Take 1 capsule (100 mg total) by mouth 2 (two) times daily. 90 capsule 2   levocetirizine (XYZAL) 5 MG tablet Take 5 mg by mouth at bedtime as needed (for seasonal allergies- if not taking generic Claritin).     pantoprazole (PROTONIX) 20 MG tablet Take 20 mg by mouth every evening.     telmisartan (MICARDIS) 20 MG  tablet Take 1 tablet (20 mg total) by mouth daily. NEED OV. 15 tablet 0   traMADol (ULTRAM) 50 MG tablet Take 2 tablets (100 mg total) by mouth every 6 (six) hours as needed for severe pain. (Patient taking differently: Take 50 mg by mouth every 6 (six) hours as needed for severe pain.) 30 tablet 0   Zinc Acetate, Oral, (ZINC ACETATE PO) Take 1 tablet by mouth daily. Received from  herbalist     acetaminophen (TYLENOL) 500 MG tablet Take 500 mg by mouth every 6 (six) hours as needed for mild pain or headache.     furosemide (LASIX) 20 MG tablet 1 tablet p.o. daily as needed for increased leg swelling. (Patient not taking: Reported on 03/19/2023) 20 tablet 0   guaiFENesin (MUCINEX) 600 MG 12 hr tablet Take 1 tablet (600 mg total) by mouth 2 (two) times daily.     ipratropium-albuterol (DUONEB) 0.5-2.5 (3) MG/3ML SOLN Take 3 mLs by nebulization every 6 (six) hours as needed. 360 mL 0   LORazepam (ATIVAN) 1 MG tablet Take 1 tablet (1 mg total) by mouth as needed. 30 min Prior to MRI or brain radiation procedure (Patient not taking: Reported on 12/10/2022) 5 tablet 0   metFORMIN (GLUCOPHAGE-XR) 500 MG 24 hr tablet Take 500 mg by mouth every morning.     nystatin (MYCOSTATIN/NYSTOP) powder Apply 1 Application topically 3 (three) times daily. (Patient not taking: Reported on 12/10/2022) 30 g 0   ondansetron (ZOFRAN) 8 MG tablet Take 1 tablet (8 mg total) by mouth every 8 (eight) hours as needed for nausea or vomiting. (Patient not taking: Reported on 12/10/2022) 30 tablet 2   OVER THE COUNTER MEDICATION Apply 1 application  topically See admin instructions. Sonafine Wound Dressing Topical Emulsion- Apply to the back 1-2 times a day     No current facility-administered medications on file prior to visit.    Allergies as of 03/19/2023 - Review Complete 03/19/2023  Allergen Reaction Noted   Compazine [prochlorperazine]  06/05/2021   Amoxicillin  10/25/2021   Norvasc [amlodipine] Swelling 04/21/2020   Trelegy  ellipta [fluticasone-umeclidin-vilant] Swelling 09/23/2019     ROS:   General:  No weight loss, Fever, chills  HEENT: No recent headaches, no nasal bleeding, no visual changes, no sore throat  Neurologic: No dizziness, blackouts, seizures. No recent symptoms of stroke or mini- stroke. No recent episodes of slurred speech, or temporary blindness.  Cardiac: No recent episodes of chest pain/pressure, no shortness of breath at rest.  No shortness of breath with exertion.  Denies history of atrial fibrillation or irregular heartbeat  Vascular: No history of rest pain in feet.  No history of claudication.  No history of non-healing ulcer, Positive history of DVT   Pulmonary: No home oxygen, no productive cough, no hemoptysis,  No asthma or wheezing  Musculoskeletal:  [ ]  Arthritis, [ ]  Low back pain,  [ ]  Joint pain  Hematologic:No history of hypercoagulable state.  No history of easy bleeding.  No history of anemia  Gastrointestinal: No hematochezia or melena,  No gastroesophageal reflux, no trouble swallowing  Urinary: [ ]  chronic Kidney disease, [ ]  on HD - [ ]  MWF or [ ]  TTHS, [ ]  Burning with urination, [ ]  Frequent urination, [ ]  Difficulty urinating;   Skin: No rashes  Psychological: No history of anxiety,  No history of depression  Physical Examination  Vitals:   03/19/23 0840  BP: (!) 145/82  Pulse: (!) 103  Resp: 16  Temp: 98.3 F (36.8 C)  TempSrc: Temporal  SpO2: 93%  Weight: 209 lb (94.8 kg)  Height: 5\' 6"  (1.676 m)    Body mass index is 33.73 kg/m.  General:  Alert and oriented, no acute distress HEENT: Normal Neck: No bruit or JVD Pulmonary: Clear to auscultation bilaterally Cardiac: Regular Rate and Rhythm without murmur Abdomen: Soft, non-tender, non-distended, no mass, no scars Skin: No rash     Extremity  Pulses:  2+ radial,  femoral, Brisk doppler dorsalis pedis, posterior tibial pulses bilaterally Musculoskeletal: No deformity or  edema  Neurologic: Upper and lower extremity motor 5/5 and symmetric  DATA:    Venous Reflux Times  +--------------+---------+------+-----------+------------+----------------+   RIGHT        Reflux NoRefluxReflux TimeDiameter cmsComments                                  Yes                                            +--------------+---------+------+-----------+------------+----------------+   CFV                    yes   >1 second                                +--------------+---------+------+-----------+------------+----------------+   FV prox                 yes                                            +--------------+---------+------+-----------+------------+----------------+   FV mid        no                                                       +--------------+---------+------+-----------+------------+----------------+   FV dist       no                                                       +--------------+---------+------+-----------+------------+----------------+   Popliteal    no                                    chronic  thrombus  +--------------+---------+------+-----------+------------+----------------+   GSV at SFJ              yes    >500 ms      0.40                       +--------------+---------+------+-----------+------------+----------------+   GSV prox thigh          yes    >500 ms      0.66                       +--------------+---------+------+-----------+------------+----------------+   GSV mid thigh no                            0.33                       +--------------+---------+------+-----------+------------+----------------+   GSV dist thighno  0.39                       +--------------+---------+------+-----------+------------+----------------+   GSV at knee   no                            0.35    branching           +--------------+---------+------+-----------+------------+----------------+   GSV prox calf           yes    >500 ms      0.33                       +--------------+---------+------+-----------+------------+----------------+   SSV Pop Fossa no                            0.32                       +--------------+---------+------+-----------+------------+----------------+   SSV prox calf no                            0.56                       +--------------+---------+------+-----------+------------+----------------+   PFV                    yes  > 1 second                                +--------------+---------+------+-----------+------------+----------------+    Summary:  Right:  - Venous reflux is noted in the right common femoral vein.  - Venous reflux is noted in the right profunda femoral vein.  - Venous reflux is noted in the right sapheno-femoral junction.  - Venous reflux is noted in the right greater saphenous vein in the thigh.  - Venous reflux is noted in the right greater saphenous vein in the calf.    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  Chronic partial occlusive DVT right popliteal.  S/P post Brain and lung CA with life long Chemo therapy.   She has used knee high compression that zip up for edema and she has mild redness/ brown  skin changes with hyperpigmentation about the lower leg and ankle.    She has become increasingly sedentary and sits with her feet in a dependent position.  The foot and ankle swelling have developed lately and have increased.  She states that the more her gith le swells the less she can walk due to burning achy pain.    I reviewed the venous duplex with the tech today and the SFJ measurement in > 0.6 as well as GSV proximal thigh > 0.66.  She will continue to use her compression, try to increase her activity, and  elevate her legs when at rest.  I am also referring her to [employer] for lymphedema pumps.  She will f/u with one of our vein guys to discuss any possible intervention such as laser ablation.  If she meets criteria for laser ablation she will need to be measured for new thigh high compression.     Mosetta Pigeon PA-C [facility] [place] Office: [phone]  MD in clinic [place]

## 2023-03-20 ENCOUNTER — Other Ambulatory Visit: Payer: Self-pay

## 2023-03-22 NOTE — Progress Notes (Unsigned)
[facility] OFFICE PROGRESS NOTE  Si Gaul, MD [address] [place] [address]  DIAGNOSIS: Stage IV (T2 a, N3, M1b) non-small cell lung cancer, adenocarcinoma presented with left upper lobe perihilar mass in addition to left hilar and mediastinal lymphadenopathy as well as left supraclavicular lymphadenopathy with brain metastasis diagnosed in September 2023.   [name]   Detected Alteration(s) / Biomarker(s)          Associated FDA-approved therapies  Clinical Trial Availability          % cfDNA or Amplification   KRAS G12C approved by FDA Adagrasib, Sotorasib Yes    1.7%   TP53 R249S None Yes          1.5%  PRIOR THERAPY: 1) SRS the the metastatic brain lesion under the care of Dr. Basilio Cairo 2) Craniotomy on 07/10/22 under the care of Dr. Jake Samples 3) radiation to the chest under care of Dr. Roselind Messier.  Last dose on 08/23/2022  CURRENT THERAPY: Systemic chemotherapy with carboplatin for an AUC of 5, to 500 mg/m2, and Keytruda 200 mg IV every 3 weeks.  First dose expected on October 08, 2022. Starting from cycle #5 she will be on maintenance treatment with Alimta and Keytruda every 3 weeks. Status post 8 cycles. Will reduce her dose of alimta to 400 mg/m2 secondary to intolerance   INTERVAL HISTORY: [place] 82 y.o. female returns to the clinic today for a follow-up visit accompanied by her husband.  The patient is currently undergoing palliative chemotherapy and immunotherapy. She was last seen by Dr. Arbutus Ped 3 weeks ago She has a restaging CT at that time, which was not read, which did not show any disease progression. She also was having issues with increased dyspnea around that time. No acute findings were found to explain her shortness of breath.  The patient continues to endorse intermittent shortness of breath.  He has nebulizers if needed.  So has a rescue inhalers which she uses during her encounter today.  She denies any significant cough.  She denies any  associated fevers.  He uses oxygen as needed at home.  She reports she uses oxygen most of the day.  She uses a CPAP at nighttime.  She reports that her breathing sometimes feels restricted and he has to rest in between activities at home.  She sees pulmonary medicine at [facility].  She is currently seeing vein specialist for her right leg swelling. They recommended continued compression and referral for lymphedema pumps.  Patient has not heard anything about obtaining the lymphedema pumps at this time.  They also are considering her for vein ablation.  She reports that the lymphedema causes itching on her lower extremities.  She reports that the itching was interfering with her ability to sleep last night.  She has some topical cream.    Regarding her treatment, overall, she tolerates it fair except for fatigue. Today, She denies any fever or night sweats.  Her appetite waxes and wanes.  She had an episode of nausea last week and took Tums.  She has not needed to take any of her antiemetics.  She denies any vomiting.  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 She denies any  diarrhea or constipation.  She reports that she has drainage in the lower portion of her eyes bilaterally in the mornings.  She is going to make a appointment with her eye doctor soon.  She denies any headaches. She is here  today for evaluation repeat blood work before undergoing cycle #9.       MEDICAL HISTORY: Past Medical History:  Diagnosis Date   Anxiety    "had panic attacks years ago"   Asthma    COPD (chronic obstructive pulmonary disease) (HCC)    Goiter 2022   History of hiatal hernia    "dx in college, never bothered me"   History of kidney stones 2012   History of radiation therapy    Left Lung-07/10/22-08/23/22- Dr. Antony Blackbird   Hypertension    Lung cancer [facility])    Sleep apnea     ALLERGIES:  is allergic to compazine [prochlorperazine], amoxicillin, norvasc [amlodipine], and  trelegy ellipta [fluticasone-umeclidin-vilant].  MEDICATIONS:  Current Outpatient Medications  Medication Sig Dispense Refill   hydrOXYzine (ATARAX) 10 MG tablet Take 1 tablet (10 mg total) by mouth 3 (three) times daily as needed. 60 tablet 2   acetaminophen (TYLENOL) 500 MG tablet Take 500 mg by mouth every 6 (six) hours as needed for mild pain or headache.     albuterol (VENTOLIN HFA) 108 (90 Base) MCG/ACT inhaler Inhale 2 puffs into the lungs every 6 (six) hours as needed for wheezing or shortness of breath. 18 g 1   ALPRAZolam (XANAX) 0.25 MG tablet Take 0.25 mg by mouth at bedtime as needed for anxiety.     apixaban (ELIQUIS) 5 MG TABS tablet Take 1 tablet (5 mg total) by mouth 2 (two) times daily. 60 tablet 3   Budeson-Glycopyrrol-Formoterol (BREZTRI AEROSPHERE) 160-9-4.8 MCG/ACT AERO INHALE 2 PUFFS BY MOUTH EVERY MORNING AND EVERY NIGHT AT BEDTIME (Patient taking differently: Inhale 2 puffs into the lungs in the morning and at bedtime.) 10.7 g 5   folic acid (FOLVITE) 1 MG tablet Take 1 tablet (1 mg total) by mouth daily. 30 tablet 2   furosemide (LASIX) 20 MG tablet 1 tablet p.o. daily as needed for increased leg swelling. (Patient not taking: Reported on 03/19/2023) 20 tablet 0   gabapentin (NEURONTIN) 100 MG capsule Take 1 capsule (100 mg total) by mouth 2 (two) times daily. 90 capsule 2   guaiFENesin (MUCINEX) 600 MG 12 hr tablet Take 1 tablet (600 mg total) by mouth 2 (two) times daily.     ipratropium-albuterol (DUONEB) 0.5-2.5 (3) MG/3ML SOLN Take 3 mLs by nebulization every 6 (six) hours as needed. 360 mL 0   levocetirizine (XYZAL) 5 MG tablet Take 5 mg by mouth at bedtime as needed (for seasonal allergies- if not taking generic Claritin).     LORazepam (ATIVAN) 1 MG tablet Take 1 tablet (1 mg total) by mouth as needed. 30 min Prior to MRI or brain radiation procedure (Patient not taking: Reported on 12/10/2022) 5 tablet 0   metFORMIN (GLUCOPHAGE-XR) 500 MG 24 hr tablet Take 500 mg by  mouth every morning.     nystatin (MYCOSTATIN/NYSTOP) powder Apply 1 Application topically 3 (three) times daily. (Patient not taking: Reported on 12/10/2022) 30 g 0   ondansetron (ZOFRAN) 8 MG tablet Take 1 tablet (8 mg total) by mouth every 8 (eight) hours as needed for nausea or vomiting. (Patient not taking: Reported on 12/10/2022) 30 tablet 2   OVER THE COUNTER MEDICATION Apply 1 application  topically See admin instructions. Sonafine Wound Dressing Topical Emulsion- Apply to the back 1-2 times a day     pantoprazole (PROTONIX) 20 MG tablet Take 20 mg by mouth every evening.     telmisartan (MICARDIS) 20 MG tablet Take 1 tablet (20 mg total)  by mouth daily. NEED OV. 15 tablet 0   traMADol (ULTRAM) 50 MG tablet Take 2 tablets (100 mg total) by mouth every 6 (six) hours as needed for severe pain. (Patient taking differently: Take 50 mg by mouth every 6 (six) hours as needed for severe pain.) 30 tablet 0   Zinc Acetate, Oral, (ZINC ACETATE PO) Take 1 tablet by mouth daily. Received from herbalist     No current facility-administered medications for this visit.    SURGICAL HISTORY:  Past Surgical History:  Procedure Laterality Date   APPENDECTOMY     APPLICATION OF CRANIAL NAVIGATION N/A 07/11/2022   Procedure: APPLICATION OF CRANIAL NAVIGATION;  Surgeon: Dawley, Alan Mulder, DO;  Location: [facility] OR;  Service: Neurosurgery;  Laterality: N/A;   BRONCHIAL BIOPSY  06/12/2021   Procedure: BRONCHIAL BIOPSIES;  Surgeon: Josephine Igo, DO;  Location: [facility] ENDOSCOPY;  Service: Pulmonary;;   BRONCHIAL BRUSHINGS  06/12/2021   Procedure: BRONCHIAL BRUSHINGS;  Surgeon: Josephine Igo, DO;  Location: [facility];  Service: Pulmonary;;   BRONCHIAL WASHINGS  06/12/2021   Procedure: BRONCHIAL WASHINGS;  Surgeon: Josephine Igo, DO;  Location: [facility];  Service: Pulmonary;;   CHOLECYSTECTOMY     COLON RESECTION     CRANIOTOMY N/A 07/11/2022   Procedure: Craniotomy for resection of tumor;  Surgeon: Bethann Goo, DO;  Location: [facility] OR;  Service: Neurosurgery;  Laterality: N/A;  RM 19   FIDUCIAL MARKER PLACEMENT  06/12/2021   Procedure: FIDUCIAL MARKER PLACEMENT;  Surgeon: Josephine Igo, DO;  Location: [facility];  Service: Pulmonary;;   hernia     x 2   IR IMAGING GUIDED PORT INSERTION  10/11/2022   TONSILLECTOMY     VIDEO BRONCHOSCOPY WITH ENDOBRONCHIAL NAVIGATION Bilateral 06/12/2021   Procedure: VIDEO BRONCHOSCOPY WITH ENDOBRONCHIAL NAVIGATION;  Surgeon: Josephine Igo, DO;  Location: [facility];  Service: Pulmonary;  Laterality: Bilateral;  ION   VIDEO BRONCHOSCOPY WITH RADIAL ENDOBRONCHIAL ULTRASOUND  06/12/2021   Procedure: RADIAL ENDOBRONCHIAL ULTRASOUND;  Surgeon: Josephine Igo, DO;  Location: [facility];  Service: Pulmonary;;    REVIEW OF SYSTEMS:   Constitutional: Positive for fatigue. Negative for appetite change, chills, fever and unexpected weight change.  HENT: Negative for mouth sores, nosebleeds, sore throat and trouble swallowing.   Eyes: Negative for eye problems and icterus.  Respiratory: Positive for shortness of breath with exertion. Negative for cough, hemoptysis, and wheezing.   Cardiovascular: Negative for chest pain. Positive for stable chronic bilateral lower extremity swelling.  Gastrointestinal: Negative for abdominal pain, constipation, diarrhea, nausea (none at this time) and vomiting.  Genitourinary: Negative for bladder incontinence, difficulty urinating, dysuria, frequency and hematuria.   Musculoskeletal: Negative for back pain, gait problem, neck pain and neck stiffness.  Skin: Negative for itching and rash.  Neurological: Negative for dizziness, extremity weakness, gait problem, headaches, light-headedness and seizures.  Hematological: Negative for adenopathy. Does not bruise/bleed easily.  Psychiatric/Behavioral: Negative for confusion, depression and sleep disturbance. The patient is not nervous/anxious.    PHYSICAL EXAMINATION:  Blood  pressure (!) 141/55, pulse 94, temperature 98.6 F (37 C), temperature source Oral, resp. rate 17, height 5\' 6"  (1.676 m), weight 205 lb 12.8 oz (93.4 kg), SpO2 95 %.  ECOG PERFORMANCE STATUS: 2  Physical Exam  Constitutional: Oriented to person, place, and time and well-developed, well-nourished, and in no distress.  HENT:  Head: Normocephalic and atraumatic.  Mouth/Throat: Oropharynx is clear and moist. No oropharyngeal exudate.  Eyes: Conjunctivae are normal. Right  eye exhibits no discharge. Left eye exhibits no discharge. No scleral icterus.  Neck: Normal range of motion. Neck supple.  Cardiovascular: Normal rate, regular rhythm, normal heart sounds and intact distal pulses.   Pulmonary/Chest: Effort normal and breath sounds normal. No respiratory distress. Some mild wheezing on exam. No rales. On supplemental oxygen.  Abdominal: Soft. Bowel sounds are normal. Exhibits no distension and no mass. There is no tenderness.  Musculoskeletal: Normal range of motion. Stable bilateral lower extremity edema.  Lymphadenopathy:    No cervical adenopathy.  Neurological: Alert and oriented to person, place, and time. Exhibits also wasting.  Examined in the chair.  Skin: Skin is warm and dry. No rash noted. Not diaphoretic. No erythema. No pallor.  Psychiatric: Mood, memory and judgment normal.  Vitals reviewed. LABORATORY DATA: Lab Results  Component Value Date   WBC 3.1 (L) 03/25/2023   HGB 11.5 (L) 03/25/2023   HCT 34.9 (L) 03/25/2023   MCV 106.4 (H) 03/25/2023   PLT 219 03/25/2023      Chemistry      Component Value Date/Time   NA 140 03/25/2023 0834   NA 139 09/17/2019 1550   K 3.7 03/25/2023 0834   CL 104 03/25/2023 0834   CO2 27 03/25/2023 0834   BUN 12 03/25/2023 0834   BUN 9 09/17/2019 1550   CREATININE 0.65 03/25/2023 0834      Component Value Date/Time   CALCIUM 9.3 03/25/2023 0834   ALKPHOS 71 03/25/2023 0834   AST 20 03/25/2023 0834   ALT 19 03/25/2023 0834    BILITOT 0.6 03/25/2023 0834       RADIOGRAPHIC STUDIES:  VAS [place] LOWER EXTREMITY VENOUS REFLUX  Result Date: 03/17/2023  Lower Venous Reflux Study Patient Name:  JERRIANNE MORINE  Date of Exam:   03/17/2023 Medical Rec #: [id number]          Accession #:    [id number] Date of Birth: [birth year]           Patient Gender: F Patient Age:   68 years Exam Location:  [facility] Procedure:      VAS [place] LOWER EXTREMITY VENOUS REFLUX Referring Phys: Graceann Congress --------------------------------------------------------------------------------  Indications: Swelling.  Risk Factors: Cancer Hx lung Ca past pregnancy. Anticoagulation: Eliquis. Comparison Study: 12/11/22 RT LE DVT study - RT Chronic popliteal v DVT Performing Technologist: Lowell Guitar RVT, RDMS  Examination Guidelines: A complete evaluation includes B-mode imaging, spectral Doppler, color Doppler, and power Doppler as needed of all accessible portions of each vessel. Bilateral testing is considered an integral part of a complete examination. Limited examinations for reoccurring indications may be performed as noted. The reflux portion of the exam is performed with the patient in reverse Trendelenburg. Significant venous reflux is defined as >500 ms in the superficial venous system, and >1 second in the deep venous system.  Venous Reflux Times +--------------+---------+------+-----------+------------+----------------+ RIGHT         Reflux NoRefluxReflux TimeDiameter cmsComments                                 Yes                                          +--------------+---------+------+-----------+------------+----------------+ CFV  yes   >1 second                              +--------------+---------+------+-----------+------------+----------------+ FV prox                 yes                                          +--------------+---------+------+-----------+------------+----------------+ FV  mid        no                                                     +--------------+---------+------+-----------+------------+----------------+ FV dist       no                                                     +--------------+---------+------+-----------+------------+----------------+ Popliteal     no                                    chronic thrombus +--------------+---------+------+-----------+------------+----------------+ GSV at SFJ              yes    >500 ms      0.40                     +--------------+---------+------+-----------+------------+----------------+ GSV prox thigh          yes    >500 ms      0.66                     +--------------+---------+------+-----------+------------+----------------+ GSV mid thigh no                            0.33                     +--------------+---------+------+-----------+------------+----------------+ GSV dist thighno                            0.39                     +--------------+---------+------+-----------+------------+----------------+ GSV at knee   no                            0.35    branching        +--------------+---------+------+-----------+------------+----------------+ GSV prox calf           yes    >500 ms      0.33                     +--------------+---------+------+-----------+------------+----------------+ SSV Pop Fossa no                            0.32                     +--------------+---------+------+-----------+------------+----------------+  SSV prox calf no                            0.56                     +--------------+---------+------+-----------+------------+----------------+ PFV                     yes  > 1 second                              +--------------+---------+------+-----------+------------+----------------+   Summary: Right: - Venous reflux is noted in the right common femoral vein. - Venous reflux is noted in the right profunda  femoral vein. - Venous reflux is noted in the right sapheno-femoral junction. - Venous reflux is noted in the right greater saphenous vein in the thigh. - Venous reflux is noted in the right greater saphenous vein in the calf.  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  *See table(s) above for measurements and observations. Electronically signed by Coral Else MD on 03/17/2023 at 4:22:59 PM.    Final    CT CHEST ABDOMEN PELVIS W CONTRAST  Result Date: 03/05/2023 CLINICAL DATA:  Stage IV lung cancer with increasing shortness of breath and hoarseness. * Tracking Code: BO * EXAM: CT CHEST, ABDOMEN, AND PELVIS WITH CONTRAST TECHNIQUE: Multidetector CT imaging of the chest, abdomen and pelvis was performed following the standard protocol during bolus administration of intravenous contrast. RADIATION DOSE REDUCTION: This exam was performed according to the departmental dose-optimization program which includes automated exposure control, adjustment of the mA and/or kV according to patient size and/or use of iterative reconstruction technique. CONTRAST:  OMNIPAQUE IOHEXOL 300 MG/ML  SOLN COMPARISON:  Chest CTA 12/31/2022. CT of the chest, abdomen and pelvis 12/06/2022. FINDINGS: CT CHEST FINDINGS Cardiovascular: No acute vascular findings are demonstrated. Right IJ Port-A-Cath extends to the superior cavoatrial junction. There is atherosclerosis of the aorta, great vessels and coronary arteries. The heart size is normal. There is no pericardial effusion. Mediastinum/Nodes: There are no enlarged mediastinal, hilar or axillary lymph nodes. Unchanged non incidental 2.2 cm left thyroid nodule on image 6/2, not hypermetabolic on prior PET-CT. The esophagus appears unremarkable. Lungs/Pleura: A small left pleural effusion has mildly increased in volume. No significant right pleural effusion or pneumothorax. The nodule posteriorly in the left upper lobe measures 1.4 x 1.0 cm on image 43/4  (previously 1.9 x 1.1 cm). Surrounding treatment changes and volume loss in the left upper lobe are similar to the previous study. Likewise, the part solid right upper lobe nodule and associated fiducial markers are similar, measuring approximately 1.5 cm on image 53/4. No new or enlarging nodules are identified. Musculoskeletal/Chest wall: No chest wall mass or suspicious osseous findings. CT ABDOMEN AND PELVIS FINDINGS Hepatobiliary: Subjective mild hepatic steatosis without focal lesion or abnormal enhancement. No significant biliary dilatation status post cholecystectomy. Pancreas: Unremarkable. No pancreatic ductal dilatation or surrounding inflammatory changes. Spleen: Normal in size without focal abnormality. Adrenals/Urinary Tract: Both adrenal glands appear normal. No evidence of urinary tract calculus, suspicious renal lesion or hydronephrosis. Stable small left renal cysts for which no follow-up imaging is recommended. The bladder appears unremarkable for its degree of distention. Stomach/Bowel: No enteric contrast administered. The stomach appears unremarkable for its degree of distension. No evidence of bowel wall thickening, distention or surrounding inflammatory change. Persistent extension of  a portion of the transverse colon into a ventral abdominal wall hernia. No evidence of incarceration or obstruction. Unchanged distal colonic anastomosis. Vascular/Lymphatic: There are no enlarged abdominal or pelvic lymph nodes. Aortic and branch vessel atherosclerosis dense of aneurysm or large vessel occlusion. Reproductive: Unchanged low-density prominence of the central uterus. No adnexal mass. Other: Stable ventral abdominal wall hernias, including a right periumbilical component containing transverse colon. No ascites or peritoneal nodularity. No pneumoperitoneum. Probable sebaceous cyst superiorly in the left buttocks measuring 3.3 cm on image 92/2, minimally enlarged from previous CT. Musculoskeletal:  No acute or significant osseous findings. Lumbar facet arthropathy. IMPRESSION: 1. No acute findings or explanation for shortness of breath or progressive hoarseness. 2. The dominant left upper lobe pulmonary nodule has mildly decreased in size with surrounding treatment changes. The part solid right upper lobe nodule is unchanged. 3. No evidence of progressive metastatic disease. 4. Small left pleural effusion has mildly increased in volume. 5. Stable incidental findings in the abdomen, including ventral abdominal wall hernias, and possible distension of the endometrial cavity, as previously described. 6. Stable left thyroid nodule, not hypermetabolic on prior PET-CT. Consider thyroid ultrasound if not previously performed. (Ref: J Am Coll Radiol. 2015 Feb;12(2): 143-50). 7.  Aortic Atherosclerosis (ICD10-I70.0). Electronically Signed   By: Carey Bullocks M.D.   On: 03/05/2023 12:20     ASSESSMENT/PLAN:  This is a very pleasant 82 year old Caucasian female with likely stage IV (T2a, N3, M1B) non-small cell lung cancer, adenocarcinoma presented with left upper lobe perihilar mass in addition to left hilar and mediastinal lymphadenopathy as well as left supraclavicular lymphadenopathy and a solitary brain metastasis diagnosed in September 2023. The molecular studies showed positive KRAS G12C mutation. Discussed this can be used in the second line setting in the future.    The patient underwent SRS and craniotomy under the care of Dr. Basilio Cairo and Dr. Jake Samples on 07/10/22. Dr. Jake Samples does not recommend any chemotherapy for at least 1 month from her surgery due to wound healing.   She is recently completed radiation to the chest under the care of Dr. Roselind Messier. The last day of radiation was scheduled for 08/23/22   The plan is to undergo systemic chemotherapy wit carboplatin for an AUC of 5, Alimta 500 mg/m, Keytruda 200 mg IV every 3 weeks. She is status post 8 cycles.  Starting from cycle #5, she started  maintenance Alimta and Keytruda. Her dose of Alimta is reduced to 400 mg/m2.   The patient had a restaging CT scan at her last appointment that did not show any evidence of disease progression or explanation for her breath.  We will arrange for chest x-ray today to see if there is any other acute etiology.  Otherwise she use her albuterol while in the encounter today.  She also was put on supplemental oxygen as she typically wears this at home.  Recommend that she follow-up with her pulmonologist if no acute findings.  Oxygen is 95% on room air.    Labs reviewed.  She will proceed with cycle #9 today as scheduled.   She will continue to follow with the vein specialist for her leg swelling. She will continue on Eliquis. Dr. Arbutus Ped talked her at lower last appointment about switching to Lovenox or Xarelto but she declined.   We will see her back for follow-up visit in 3 weeks for evaluation repeat blood work before undergoing cycle #10  The patient mentions that she is using herbal supplements.  I  discussed CBR recommendations and avoid any herbal supplements while undergoing chemotherapy as it could react with her chemotherapy.  Refilled her Emla cream today.  I sent her atarax for her itching at night which is disturbing her sleep. Let her know this may cause drowsiness and not to take this prior to activities, such as driving. She knows not to take this with other antihistamines. She knows not to take this with her xyzal.   She will call her vascular provider's office about not hearing back about the lymphedema pumps.  The patient was advised to call immediately if she has any concerning symptoms in the interval. The patient voices understanding of current disease status and treatment options and is in agreement with the current care plan. All questions were answered. The patient knows to call the clinic with any problems, questions or concerns. We can certainly see the patient much sooner if  necessary .               Orders Placed This Encounter  Procedures   DG Chest 2 View    Standing Status:   Future    Standing Expiration Date:   03/24/2024    Order Specific Question:   Reason for Exam (SYMPTOM  OR DIAGNOSIS REQUIRED)    Answer:   Shortness of breath. Has lung cancer    Order Specific Question:   Preferred imaging location?    Answer:   [facility]     The total time spent in the appointment was 30-39 minutes  Mckala Pantaleon L Evea Sheek, PA-C 03/25/23

## 2023-03-25 ENCOUNTER — Ambulatory Visit (HOSPITAL_COMMUNITY)
Admission: RE | Admit: 2023-03-25 | Discharge: 2023-03-25 | Disposition: A | Discharge status: Home / Self Care | Payer: Medicare Other | Source: Ambulatory Visit | Attending: Physician Assistant | Admitting: Physician Assistant

## 2023-03-25 ENCOUNTER — Inpatient Hospital Stay: Payer: Medicare Other | Attending: Internal Medicine

## 2023-03-25 ENCOUNTER — Inpatient Hospital Stay (HOSPITAL_BASED_OUTPATIENT_CLINIC_OR_DEPARTMENT_OTHER): Payer: Medicare Other | Admitting: Physician Assistant

## 2023-03-25 ENCOUNTER — Inpatient Hospital Stay: Payer: Medicare Other

## 2023-03-25 ENCOUNTER — Other Ambulatory Visit: Payer: Self-pay

## 2023-03-25 VITALS — BP 141/55 | HR 94 | Temp 98.6°F | Resp 17 | Ht 66.0 in | Wt 205.8 lb

## 2023-03-25 DIAGNOSIS — Z87442 Personal history of urinary calculi: Secondary | ICD-10-CM | POA: Insufficient documentation

## 2023-03-25 DIAGNOSIS — C3490 Malignant neoplasm of unspecified part of unspecified bronchus or lung: Secondary | ICD-10-CM

## 2023-03-25 DIAGNOSIS — Z79899 Other long term (current) drug therapy: Secondary | ICD-10-CM | POA: Diagnosis not present

## 2023-03-25 DIAGNOSIS — Z880 Allergy status to penicillin: Secondary | ICD-10-CM | POA: Insufficient documentation

## 2023-03-25 DIAGNOSIS — R110 Nausea: Secondary | ICD-10-CM | POA: Insufficient documentation

## 2023-03-25 DIAGNOSIS — Z5111 Encounter for antineoplastic chemotherapy: Secondary | ICD-10-CM

## 2023-03-25 DIAGNOSIS — Z888 Allergy status to other drugs, medicaments and biological substances status: Secondary | ICD-10-CM | POA: Diagnosis not present

## 2023-03-25 DIAGNOSIS — L299 Pruritus, unspecified: Secondary | ICD-10-CM | POA: Diagnosis not present

## 2023-03-25 DIAGNOSIS — K439 Ventral hernia without obstruction or gangrene: Secondary | ICD-10-CM | POA: Diagnosis not present

## 2023-03-25 DIAGNOSIS — I890 Lymphedema, not elsewhere classified: Secondary | ICD-10-CM | POA: Diagnosis not present

## 2023-03-25 DIAGNOSIS — Z9049 Acquired absence of other specified parts of digestive tract: Secondary | ICD-10-CM | POA: Diagnosis not present

## 2023-03-25 DIAGNOSIS — F419 Anxiety disorder, unspecified: Secondary | ICD-10-CM | POA: Diagnosis not present

## 2023-03-25 DIAGNOSIS — C3412 Malignant neoplasm of upper lobe, left bronchus or lung: Secondary | ICD-10-CM | POA: Diagnosis present

## 2023-03-25 DIAGNOSIS — I700 Atherosclerosis of aorta: Secondary | ICD-10-CM | POA: Diagnosis not present

## 2023-03-25 DIAGNOSIS — Z923 Personal history of irradiation: Secondary | ICD-10-CM | POA: Diagnosis not present

## 2023-03-25 DIAGNOSIS — K760 Fatty (change of) liver, not elsewhere classified: Secondary | ICD-10-CM | POA: Insufficient documentation

## 2023-03-25 DIAGNOSIS — Z79631 Long term (current) use of antimetabolite agent: Secondary | ICD-10-CM | POA: Diagnosis not present

## 2023-03-25 DIAGNOSIS — M7989 Other specified soft tissue disorders: Secondary | ICD-10-CM | POA: Insufficient documentation

## 2023-03-25 DIAGNOSIS — Z95828 Presence of other vascular implants and grafts: Secondary | ICD-10-CM

## 2023-03-25 DIAGNOSIS — Z5112 Encounter for antineoplastic immunotherapy: Secondary | ICD-10-CM | POA: Insufficient documentation

## 2023-03-25 DIAGNOSIS — Z79620 Long term (current) use of immunosuppressive biologic: Secondary | ICD-10-CM | POA: Insufficient documentation

## 2023-03-25 DIAGNOSIS — C7931 Secondary malignant neoplasm of brain: Secondary | ICD-10-CM | POA: Insufficient documentation

## 2023-03-25 DIAGNOSIS — M47816 Spondylosis without myelopathy or radiculopathy, lumbar region: Secondary | ICD-10-CM | POA: Diagnosis not present

## 2023-03-25 DIAGNOSIS — Z7901 Long term (current) use of anticoagulants: Secondary | ICD-10-CM | POA: Insufficient documentation

## 2023-03-25 LAB — CMP (CANCER CENTER ONLY)
ALT: 19 U/L (ref 0–44)
AST: 20 U/L (ref 15–41)
Albumin: 3.9 g/dL (ref 3.5–5.0)
Alkaline Phosphatase: 71 U/L (ref 38–126)
Anion gap: 9 (ref 5–15)
BUN: 12 mg/dL (ref 8–23)
CO2: 27 mmol/L (ref 22–32)
Calcium: 9.3 mg/dL (ref 8.9–10.3)
Chloride: 104 mmol/L (ref 98–111)
Creatinine: 0.65 mg/dL (ref 0.44–1.00)
GFR, Estimated: >60 mL/min (ref >60)
Glucose, Bld: 152 mg/dL — ABNORMAL HIGH (ref 70–99)
Potassium: 3.7 mmol/L (ref 3.5–5.1)
Sodium: 140 mmol/L (ref 135–145)
Total Bilirubin: 0.6 mg/dL (ref 0.3–1.2)
Total Protein: 6.3 g/dL — ABNORMAL LOW (ref 6.5–8.1)

## 2023-03-25 LAB — CBC WITH DIFFERENTIAL (CANCER CENTER ONLY)
Abs Immature Granulocytes: 0.01 10*3/uL (ref 0.00–0.07)
Basophils Absolute: 0 10*3/uL (ref 0.0–0.1)
Basophils Relative: 1 %
Eosinophils Absolute: 0.1 10*3/uL (ref 0.0–0.5)
Eosinophils Relative: 3 %
HCT: 34.9 % — ABNORMAL LOW (ref 36.0–46.0)
Hemoglobin: 11.5 g/dL — ABNORMAL LOW (ref 12.0–15.0)
Immature Granulocytes: 0 %
Lymphocytes Relative: 19 %
Lymphs Abs: 0.6 10*3/uL — ABNORMAL LOW (ref 0.7–4.0)
MCH: 35.1 pg — ABNORMAL HIGH (ref 26.0–34.0)
MCHC: 33 g/dL (ref 30.0–36.0)
MCV: 106.4 fL — ABNORMAL HIGH (ref 80.0–100.0)
Monocytes Absolute: 0.4 10*3/uL (ref 0.1–1.0)
Monocytes Relative: 11 %
Neutro Abs: 2 10*3/uL (ref 1.7–7.7)
Neutrophils Relative %: 66 %
Platelet Count: 219 10*3/uL (ref 150–400)
RBC: 3.28 MIL/uL — ABNORMAL LOW (ref 3.87–5.11)
RDW: 14 % (ref 11.5–15.5)
WBC Count: 3.1 10*3/uL — ABNORMAL LOW (ref 4.0–10.5)
nRBC: 0 % (ref 0.0–0.2)

## 2023-03-25 LAB — TSH: TSH: 6.844 u[IU]/mL — ABNORMAL HIGH (ref 0.350–4.500)

## 2023-03-25 MED ORDER — SODIUM CHLORIDE 0.9 % IV SOLN
400.0000 mg/m2 | Freq: Once | INTRAVENOUS | Status: AC
  Administered 2023-03-25: 800 mg via INTRAVENOUS
  Filled 2023-03-25: qty 20

## 2023-03-25 MED ORDER — SODIUM CHLORIDE 0.9 % IV SOLN
200.0000 mg | Freq: Once | INTRAVENOUS | Status: AC
  Administered 2023-03-25: 200 mg via INTRAVENOUS
  Filled 2023-03-25: qty 200

## 2023-03-25 MED ORDER — SODIUM CHLORIDE 0.9% FLUSH
10.0000 mL | INTRAVENOUS | Status: DC | PRN
  Administered 2023-03-25: 10 mL

## 2023-03-25 MED ORDER — LIDOCAINE-PRILOCAINE 2.5-2.5 % EX CREA
1.0000 | TOPICAL_CREAM | CUTANEOUS | 2 refills | Status: AC | PRN
Start: 2023-03-25 — End: ?

## 2023-03-25 MED ORDER — SODIUM CHLORIDE 0.9% FLUSH
10.0000 mL | Freq: Once | INTRAVENOUS | Status: AC
  Administered 2023-03-25: 10 mL

## 2023-03-25 MED ORDER — HEPARIN SOD (PORK) LOCK FLUSH 100 UNIT/ML IV SOLN
500.0000 [IU] | Freq: Once | INTRAVENOUS | Status: AC | PRN
  Administered 2023-03-25: 500 [IU]

## 2023-03-25 MED ORDER — SODIUM CHLORIDE 0.9 % IV SOLN: Freq: Once | INTRAVENOUS | Status: AC

## 2023-03-25 MED ORDER — CYANOCOBALAMIN 1000 MCG/ML IJ SOLN
1000.0000 ug | Freq: Once | INTRAMUSCULAR | Status: AC
  Administered 2023-03-25: 1000 ug via INTRAMUSCULAR
  Filled 2023-03-25: qty 1

## 2023-03-25 MED ORDER — HYDROXYZINE HCL 10 MG PO TABS
10.0000 mg | ORAL_TABLET | Freq: Three times a day (TID) | ORAL | 2 refills | Status: AC | PRN
Start: 2023-03-25 — End: ?

## 2023-03-25 MED ORDER — ONDANSETRON HCL 4 MG/2ML IJ SOLN
8.0000 mg | Freq: Once | INTRAMUSCULAR | Status: AC
  Administered 2023-03-25: 8 mg via INTRAVENOUS
  Filled 2023-03-25: qty 4

## 2023-03-25 NOTE — Patient Instructions (Signed)
[facility]  Discharge Instructions: Thank you for choosing [facility] to provide your oncology and hematology care.   If you have a lab appointment with the Cancer Center, please go directly to the Cancer Center and check in at the registration area.   Wear comfortable clothing and clothing appropriate for easy access to any Portacath or PICC line.   We strive to give you quality time with your provider. You may need to reschedule your appointment if you arrive late (15 or more minutes).  Arriving late affects you and other patients whose appointments are after yours.  Also, if you miss three or more appointments without notifying the office, you may be dismissed from the clinic at the provider's discretion.      For prescription refill requests, have your pharmacy contact our office and allow 72 hours for refills to be completed.    Today you received the following chemotherapy and/or immunotherapy agents: pembrolizumab and pemetrexed      To help prevent nausea and vomiting after your treatment, we encourage you to take your nausea medication as directed.  BELOW ARE SYMPTOMS THAT SHOULD BE REPORTED IMMEDIATELY: *FEVER GREATER THAN 100.4 F (38 C) OR HIGHER *CHILLS OR SWEATING *NAUSEA AND VOMITING THAT IS NOT CONTROLLED WITH YOUR NAUSEA MEDICATION *UNUSUAL SHORTNESS OF BREATH *UNUSUAL BRUISING OR BLEEDING *URINARY PROBLEMS (pain or burning when urinating, or frequent urination) *BOWEL PROBLEMS (unusual diarrhea, constipation, pain near the anus) TENDERNESS IN MOUTH AND THROAT WITH OR WITHOUT PRESENCE OF ULCERS (sore throat, sores in mouth, or a toothache) UNUSUAL RASH, SWELLING OR PAIN  UNUSUAL VAGINAL DISCHARGE OR ITCHING   Items with * indicate a potential emergency and should be followed up as soon as possible or go to the Emergency Department if any problems should occur.  Please show the CHEMOTHERAPY ALERT CARD or IMMUNOTHERAPY  ALERT CARD at check-in to the Emergency Department and triage nurse.  Should you have questions after your visit or need to cancel or reschedule your appointment, please contact [facility]  Dept: [phone]  and follow the prompts.  Office hours are 8:00 a.m. to 4:30 p.m. Monday - Friday. Please note that voicemails left after 4:00 p.m. may not be returned until the following business day.  We are closed weekends and major holidays. You have access to a nurse at all times for urgent questions. Please call the main number to the clinic Dept: [phone] and follow the prompts.   For any non-urgent questions, you may also contact your provider using MyChart. We now offer e-Visits for anyone 18 and older to request care online for non-urgent symptoms. For details visit [email or web address].   Also download the MyChart app! Go to the app store, search "MyChart", open the app, select [facility], and log in with your MyChart username and password.   

## 2023-03-26 ENCOUNTER — Telehealth: Payer: Self-pay

## 2023-03-26 NOTE — Telephone Encounter (Signed)
Notified Patient of prior authorization approval for Hydroxyzine HCL 10 mg tablets. Medication is approved through 03/25/2024. No other needs or concerns voiced at this time.

## 2023-03-27 LAB — T4: T4, Total: 10.7 ug/dL (ref 4.5–12.0)

## 2023-04-04 ENCOUNTER — Telehealth: Payer: Self-pay | Admitting: Medical Oncology

## 2023-04-04 NOTE — Telephone Encounter (Signed)
New sharp pain in mid back for two days . It is triggered when she takes a normal breath or bends over. It  goes away when I exhale.I hear her  wheezing.  Last night she used her albuterol and Beztri and her nebulizer last night.  As we were talking she said " well now I don't have the pain". She said she will stay at home now and call access nurse line or go to ED if symptoms worsen.   I recommended she take a nebulizer tx now and gave her emergency room precautions.

## 2023-04-09 ENCOUNTER — Emergency Department (HOSPITAL_COMMUNITY)
Admission: EM | Admit: 2023-04-09 | Discharge: 2023-04-09 | Disposition: A | Discharge status: Home / Self Care | Payer: Medicare Other | Attending: Emergency Medicine | Admitting: Emergency Medicine

## 2023-04-09 ENCOUNTER — Encounter (HOSPITAL_COMMUNITY): Payer: Self-pay | Admitting: Emergency Medicine

## 2023-04-09 ENCOUNTER — Other Ambulatory Visit: Payer: Self-pay

## 2023-04-09 ENCOUNTER — Telehealth: Payer: Self-pay | Admitting: Medical Oncology

## 2023-04-09 ENCOUNTER — Emergency Department (HOSPITAL_COMMUNITY): Payer: Medicare Other

## 2023-04-09 ENCOUNTER — Other Ambulatory Visit: Payer: Self-pay | Admitting: Physician Assistant

## 2023-04-09 DIAGNOSIS — J432 Centrilobular emphysema: Secondary | ICD-10-CM | POA: Insufficient documentation

## 2023-04-09 DIAGNOSIS — Z7901 Long term (current) use of anticoagulants: Secondary | ICD-10-CM | POA: Insufficient documentation

## 2023-04-09 DIAGNOSIS — J90 Pleural effusion, not elsewhere classified: Secondary | ICD-10-CM

## 2023-04-09 DIAGNOSIS — Z79899 Other long term (current) drug therapy: Secondary | ICD-10-CM | POA: Diagnosis not present

## 2023-04-09 DIAGNOSIS — R000 Tachycardia, unspecified: Secondary | ICD-10-CM | POA: Diagnosis present

## 2023-04-09 DIAGNOSIS — I2510 Atherosclerotic heart disease of native coronary artery without angina pectoris: Secondary | ICD-10-CM | POA: Diagnosis not present

## 2023-04-09 DIAGNOSIS — R0602 Shortness of breath: Secondary | ICD-10-CM | POA: Insufficient documentation

## 2023-04-09 DIAGNOSIS — C3490 Malignant neoplasm of unspecified part of unspecified bronchus or lung: Secondary | ICD-10-CM

## 2023-04-09 DIAGNOSIS — Z86718 Personal history of other venous thrombosis and embolism: Secondary | ICD-10-CM | POA: Diagnosis not present

## 2023-04-09 DIAGNOSIS — D649 Anemia, unspecified: Secondary | ICD-10-CM | POA: Diagnosis not present

## 2023-04-09 DIAGNOSIS — J449 Chronic obstructive pulmonary disease, unspecified: Secondary | ICD-10-CM | POA: Insufficient documentation

## 2023-04-09 DIAGNOSIS — I700 Atherosclerosis of aorta: Secondary | ICD-10-CM | POA: Insufficient documentation

## 2023-04-09 DIAGNOSIS — I8290 Acute embolism and thrombosis of unspecified vein: Secondary | ICD-10-CM

## 2023-04-09 LAB — CBC WITH DIFFERENTIAL/PLATELET
Abs Immature Granulocytes: 0.01 10*3/uL (ref 0.00–0.07)
Basophils Absolute: 0 10*3/uL (ref 0.0–0.1)
Basophils Relative: 1 %
Eosinophils Absolute: 0.1 10*3/uL (ref 0.0–0.5)
Eosinophils Relative: 1 %
HCT: 34.4 % — ABNORMAL LOW (ref 36.0–46.0)
Hemoglobin: 10.8 g/dL — ABNORMAL LOW (ref 12.0–15.0)
Immature Granulocytes: 0 %
Lymphocytes Relative: 16 %
Lymphs Abs: 0.8 10*3/uL (ref 0.7–4.0)
MCH: 34.1 pg — ABNORMAL HIGH (ref 26.0–34.0)
MCHC: 31.4 g/dL (ref 30.0–36.0)
MCV: 108.5 fL — ABNORMAL HIGH (ref 80.0–100.0)
Monocytes Absolute: 0.6 10*3/uL (ref 0.1–1.0)
Monocytes Relative: 12 %
Neutro Abs: 3.2 10*3/uL (ref 1.7–7.7)
Neutrophils Relative %: 70 %
Platelets: 247 10*3/uL (ref 150–400)
RBC: 3.17 MIL/uL — ABNORMAL LOW (ref 3.87–5.11)
RDW: 14 % (ref 11.5–15.5)
WBC: 4.7 10*3/uL (ref 4.0–10.5)
nRBC: 0 % (ref 0.0–0.2)

## 2023-04-09 LAB — TROPONIN I (HIGH SENSITIVITY)
Troponin I (High Sensitivity): 11 ng/L (ref <18)
Troponin I (High Sensitivity): 11 ng/L (ref <18)

## 2023-04-09 LAB — BASIC METABOLIC PANEL
Anion gap: 10 (ref 5–15)
BUN: 18 mg/dL (ref 8–23)
CO2: 25 mmol/L (ref 22–32)
Calcium: 8.8 mg/dL — ABNORMAL LOW (ref 8.9–10.3)
Chloride: 104 mmol/L (ref 98–111)
Creatinine, Ser: 0.58 mg/dL (ref 0.44–1.00)
GFR, Estimated: >60 mL/min (ref >60)
Glucose, Bld: 196 mg/dL — ABNORMAL HIGH (ref 70–99)
Potassium: 3.5 mmol/L (ref 3.5–5.1)
Sodium: 139 mmol/L (ref 135–145)

## 2023-04-09 LAB — BRAIN NATRIURETIC PEPTIDE: B Natriuretic Peptide: 50.9 pg/mL (ref 0.0–100.0)

## 2023-04-09 MED ORDER — OXYCODONE HCL 5 MG PO TABS
5.0000 mg | ORAL_TABLET | Freq: Once | ORAL | Status: AC
  Administered 2023-04-09: 5 mg via ORAL
  Filled 2023-04-09: qty 1

## 2023-04-09 MED ORDER — ONDANSETRON HCL 4 MG/2ML IJ SOLN
4.0000 mg | Freq: Once | INTRAMUSCULAR | Status: AC
  Administered 2023-04-09: 4 mg via INTRAVENOUS
  Filled 2023-04-09: qty 2

## 2023-04-09 MED ORDER — HEPARIN SOD (PORK) LOCK FLUSH 100 UNIT/ML IV SOLN
500.0000 [IU] | Freq: Once | INTRAVENOUS | Status: AC
  Administered 2023-04-09: 500 [IU]
  Filled 2023-04-09: qty 5

## 2023-04-09 MED ORDER — OXYCODONE HCL 5 MG PO TABS: 5.0000 mg | ORAL_TABLET | ORAL | 0 refills | Status: AC | PRN

## 2023-04-09 MED ORDER — IOHEXOL 350 MG/ML SOLN
80.0000 mL | Freq: Once | INTRAVENOUS | Status: AC | PRN
  Administered 2023-04-09: 80 mL via INTRAVENOUS

## 2023-04-09 MED ORDER — MORPHINE SULFATE (PF) 4 MG/ML IV SOLN
4.0000 mg | Freq: Once | INTRAVENOUS | Status: AC
  Administered 2023-04-09: 4 mg via INTRAVENOUS
  Filled 2023-04-09: qty 1

## 2023-04-09 MED ORDER — IPRATROPIUM-ALBUTEROL 0.5-2.5 (3) MG/3ML IN SOLN
3.0000 mL | Freq: Once | RESPIRATORY_TRACT | Status: AC
  Administered 2023-04-09: 3 mL via RESPIRATORY_TRACT
  Filled 2023-04-09: qty 3

## 2023-04-09 NOTE — Discharge Instructions (Signed)
Follow up with your oncologist to review your work up today. Return to the ER for fevers, any worsening or concerning symptoms.  Take Oxycodone as needed as prescribed for pain. Take Colace and Miralax as needed for bowels.

## 2023-04-09 NOTE — ED Provider Notes (Signed)
[place] [facility] Provider Note   CSN: [id number] Arrival date & time: 04/09/23  1645     History  Chief Complaint  Patient presents with   lung pain    Laurie Mccoy is a 82 y.o. female.  82 year old female with history of lung cancer, currently on chemo with last chemo treatment 2 weeks ago presents with complaint of pain in her left lung.  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  She denies fevers, chills.  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       Home Medications Prior to Admission medications   Medication Sig Start Date End Date Taking? Authorizing Provider  oxyCODONE (OXY IR/ROXICODONE) 5 MG immediate release tablet Take 1 tablet (5 mg total) by mouth every 4 (four) hours as needed for severe pain. 04/09/23  Yes Jeannie Fend, PA-C  albuterol (VENTOLIN HFA) 108 (90 Base) MCG/ACT inhaler Inhale 2 puffs into the lungs every 6 (six) hours as needed for wheezing or shortness of breath. 10/18/19   Nyoka Cowden, MD  ALPRAZolam Prudy Feeler) 0.25 MG tablet Take 0.25 mg by mouth at bedtime as needed for anxiety.    [provider]  apixaban (ELIQUIS) 5 MG TABS tablet Take 1 tablet (5 mg total) by mouth 2 (two) times daily. 01/24/23   Heilingoetter, Cassandra L, PA-C  Budeson-Glycopyrrol-Formoterol (BREZTRI AEROSPHERE) 160-9-4.8 MCG/ACT AERO INHALE 2 PUFFS BY MOUTH EVERY MORNING AND EVERY NIGHT AT BEDTIME Patient taking differently: Inhale 2 puffs into the lungs in the morning and at bedtime. 05/09/22   Nyoka Cowden, MD  folic acid (FOLVITE) 1 MG tablet Take 1 tablet (1 mg total) by mouth daily. 07/22/22   Heilingoetter, Cassandra L, PA-C   furosemide (LASIX) 20 MG tablet 1 tablet p.o. daily as needed for increased leg swelling. Patient not taking: Reported on 03/19/2023 02/05/23   Si Gaul, MD  gabapentin (NEURONTIN) 100 MG capsule Take 1 capsule (100 mg total) by mouth 2 (two) times daily. 11/05/22   Heilingoetter, Cassandra L, PA-C  hydrOXYzine (ATARAX) 10 MG tablet Take 1 tablet (10 mg total) by mouth 3 (three) times daily as needed. 03/25/23   Heilingoetter, Cassandra L, PA-C  ipratropium-albuterol (DUONEB) 0.5-2.5 (3) MG/3ML SOLN Take 3 mLs by nebulization every 6 (six) hours as needed. 01/02/23   Glyn Ade, MD  levocetirizine (XYZAL) 5 MG tablet Take 5 mg by mouth at bedtime as needed (for seasonal allergies- if not taking generic Claritin).    [provider]  lidocaine-prilocaine (EMLA) cream Apply 1 Application topically as needed. 03/25/23   Heilingoetter, Cassandra L, PA-C  LORazepam (ATIVAN) 1 MG tablet Take 1 tablet (1 mg total) by mouth as needed. 30 min Prior to MRI or brain radiation procedure Patient not taking: Reported on 12/10/2022 07/02/22   Antony Blackbird, MD  ondansetron (ZOFRAN) 8 MG tablet Take 1 tablet (8 mg total) by mouth every 8 (eight) hours as needed for nausea or vomiting. Patient not taking: Reported on 12/10/2022 07/22/22   Heilingoetter, Cassandra L, PA-C  pantoprazole (PROTONIX) 20 MG tablet Take 20 mg by mouth every evening. 07/19/22   [provider]  telmisartan (MICARDIS) 20 MG tablet Take 1 tablet (20 mg total) by mouth daily. NEED OV. 10/30/22   O'Neal, Ronnald Ramp, MD  traMADol (ULTRAM) 50 MG tablet Take 2 tablets (100 mg total) by mouth every 6 (six) hours as needed for severe pain. Patient taking differently: Take 50 mg by mouth every 6 (six) hours as needed for severe pain. 07/13/22   Dawley, Troy C, DO  Zinc Acetate, Oral, (ZINC ACETATE PO) Take 1 tablet by mouth daily. Received from [employer], Historical, MD      Allergies    Compazine  [prochlorperazine], Amoxicillin, Norvasc [amlodipine], and Trelegy ellipta [fluticasone-umeclidin-vilant]    Review of Systems   Review of Systems Negative except as per HPI Physical Exam Updated Vital Signs BP (!) 142/58   Pulse 79   Temp 98.6 F (37 C) (Oral)   Resp 20   Ht 5\' 6"  (1.676 m)   Wt 93 kg   SpO2 93%   BMI 33.09 kg/m  Physical Exam Vitals and nursing note reviewed.  Constitutional:      General: She is not in acute distress.    Appearance: She is well-developed. She is not diaphoretic.     Comments: Appears uncomfortable  HENT:     Head: Normocephalic and atraumatic.  Cardiovascular:     Rate and Rhythm: Regular rhythm. Tachycardia present.     Heart sounds: Normal heart sounds.  Pulmonary:     Effort: Pulmonary effort is normal.     Breath sounds: Normal breath sounds.  Abdominal:     Palpations: Abdomen is soft.     Tenderness: There is no abdominal tenderness.  Musculoskeletal:     Cervical back: Neck supple.     Right lower leg: No edema.     Left lower leg: No edema.  Skin:    General: Skin is warm and dry.  Neurological:     Mental Status: She is alert and oriented to person, place, and time.  Psychiatric:        Behavior: Behavior normal.     ED Results / Procedures / Treatments   Labs (all labs ordered are listed, but only abnormal results are displayed) Labs Reviewed  BASIC METABOLIC PANEL - Abnormal; Notable for the following components:      Result Value   Glucose, Bld 196 (*)    Calcium 8.8 (*)    All other components within normal limits  CBC WITH DIFFERENTIAL/PLATELET - Abnormal; Notable for the following components:   RBC 3.17 (*)    Hemoglobin 10.8 (*)    HCT 34.4 (*)    MCV 108.5 (*)    MCH 34.1 (*)    All other components within normal limits  BRAIN NATRIURETIC PEPTIDE  TROPONIN I (HIGH SENSITIVITY)  TROPONIN I (HIGH SENSITIVITY)    EKG None  Radiology CT Angio Chest PE W/Cm &/Or Wo Cm  Result Date:  04/09/2023 CLINICAL DATA:  Stage IV lung cancer with shortness of breath associated with left-sided chest pain EXAM: CT ANGIOGRAPHY CHEST WITH CONTRAST TECHNIQUE: Multidetector CT imaging of the chest was performed using the standard protocol during bolus administration of intravenous contrast. Multiplanar CT image reconstructions and MIPs were obtained to evaluate the vascular anatomy. RADIATION DOSE REDUCTION: This exam was performed according to the departmental dose-optimization program which includes automated exposure control, adjustment of the mA and/or kV according to patient size and/or use of iterative reconstruction technique. CONTRAST:  80mL OMNIPAQUE IOHEXOL 350 MG/ML SOLN COMPARISON:  CT chest dated 02/28/2023 FINDINGS: Cardiovascular: The study is high quality for the evaluation of pulmonary embolism. There are no filling defects in the central, lobar, segmental or subsegmental pulmonary artery branches to suggest acute pulmonary embolism. Great vessels are normal in course and caliber. Normal heart size. No significant pericardial fluid/thickening. Coronary artery calcifications and aortic atherosclerosis. Mediastinum/Nodes: Unchanged 2.4 cm left thyroid nodule (4:7), previously evaluated. No specific follow-up imaging recommended. Normal esophagus. No pathologically enlarged axillary, supraclavicular, mediastinal, or hilar lymph nodes. Lungs/Pleura: The central airways are patent. Upper lobe predominant centrilobular emphysema. Increased diffuse bronchial wall thickening. Known posterior left upper lobe nodule is not well delineated from the adjacent atelectasis/consolidation. Similar appearance of posttreatment changes in the left upper lobe. Interval increased density of irregular right upper lobe part solid nodule with associated fiducial marker measuring 1.2 x 1.0 cm (12:60). New irregular subpleural left lower lobe nodule measures 5 x 5 mm (4:37). No pneumothorax. Increased moderate left pleural  effusion. Upper abdomen: Cholecystectomy. Musculoskeletal: No acute or abnormal lytic or blastic osseous lesions. Multilevel degenerative changes of the partially imaged thoracic and lumbar spine. Review of the MIP images confirms the above findings. IMPRESSION: 1. No evidence of pulmonary embolism. 2. Increased moderate left pleural effusion. 3. Increased diffuse bronchial wall thickening, which can be seen in the setting of bronchitis. 4. Known posterior left upper lobe nodule is not well delineated from the adjacent atelectasis/consolidation. Similar appearance of posttreatment changes in the left upper lobe. 5. Interval increased density of irregular right upper lobe part solid nodule with associated fiducial marker measuring 1.2 x 1.0 cm. New irregular subpleural left lower lobe nodule measures 5 mm. Attention on follow-up. 6. Aortic Atherosclerosis (ICD10-I70.0) and Emphysema (ICD10-J43.9). Coronary artery calcifications. Assessment for potential risk factor modification, dietary therapy or pharmacologic therapy may be warranted, if clinically indicated. Electronically Signed   By: Agustin Cree M.D.   On: 04/09/2023 19:38   DG Chest Port 1 View  Result Date: 04/09/2023 CLINICAL DATA:  CP EXAM: PORTABLE CHEST 1 VIEW COMPARISON:  Chest x-ray March 25, 2023. FINDINGS: Similar opacities in the left upper lung, compatible with known nodules and treatment changes. No new or worsening airspace opacities. No visible pleural effusions or pneumothorax. Right-sided Port-A-Cath in similar position. Right upper lobe surgical markers noted. IMPRESSION: Similar opacities in the left upper lung, compatible with known nodules and treatment changes. No new or worsening airspace opacities. Electronically Signed   By: Feliberto Harts M.D.   On: 04/09/2023 17:27    Procedures Procedures    Medications Ordered in ED Medications  oxyCODONE (Oxy IR/ROXICODONE) immediate release tablet 5 mg (has no administration in time  range)  heparin lock flush 100 unit/mL (has no administration in time range)  ipratropium-albuterol (DUONEB) 0.5-2.5 (3) MG/3ML nebulizer solution 3 mL (3 mLs Nebulization Given 04/09/23 1740)  ondansetron (ZOFRAN) injection 4 mg (4 mg Intravenous Given 04/09/23 1739)  morphine (PF) 4 MG/ML injection 4 mg (4 mg Intravenous Given 04/09/23 1739)  iohexol (OMNIPAQUE) 350 MG/ML injection 80 mL (80 mLs Intravenous Contrast Given 04/09/23 1919)    ED Course/ Medical Decision Making/ A&P                             Medical Decision Making Amount and/or Complexity of Data Reviewed Labs: ordered. Radiology: ordered.  Risk Prescription drug management.   This patient presents to the ED for concern of pleuritic chest pain, this involves an extensive number  of treatment options, and is a complaint that carries with it a high risk of complications and morbidity.  The differential diagnosis includes but not limited PE, ACS, effusion, malignant process    Co morbidities that complicate the patient evaluation  COPD, pneumonia, lung cancer, hyperlipidemia   Additional history obtained:  Additional history obtained from spouse at bedside who contributes to history as above External records from outside source obtained and reviewed including recent office visit to oncology dated 03/25/2023   Lab Tests:  I Ordered, and personally interpreted labs.  The pertinent results include: BNP within normal notes.  Troponins are unchanged at 11 and 11.  CBC with mild anemia at 10.8, not significantly changed from prior.  BMP with elevated glucose at 196.   Imaging Studies ordered:  I ordered imaging studies including chest x-ray, CT PE study I independently visualized and interpreted imaging which showed negative for pneumonia and PE, does show moderate pleural effusion.  Discussed report in full with patient, recommend follow-up with her oncologist for review. I agree with the radiologist  interpretation   Cardiac Monitoring: / EKG:  The patient was maintained on a cardiac monitor.  I personally viewed and interpreted the cardiac monitored which showed an underlying rhythm of: Sinus tachycardia, rate 101   Problem List / ED Course / Critical interventions / Medication management  82 year old female presents with complaint of pleuritic left lung pain in the setting of lung cancer with a history of DVT, on Eliquis.  On exam, no chest wall tenderness, lungs clear.  Workup reveals moderate left pleural effusion, known lung cancer.  Troponins are unchanged, EKG without ischemic changes, workup otherwise unrevealing.  Discussed results with patient who feels reassured.  Advised to follow-up with her oncologist to review her CT today and compared to prior records.  Provided with prescription for pain medication.  Shared decision making, offered admission, patient would like to be discharged to the comfort of her home. I ordered medication including morphine, Zofran, Oxy IR for pain Reevaluation of the patient after these medicines showed that the patient improved I have reviewed the patients home medicines and have made adjustments as needed   Social Determinants of Health:  Lives with spouse, has specialty care team for follow-up   Test / Admission - Considered:  Offered admission, patient would like to be discharged home.  She states that she has relief knowing that it is not a PE or pneumonia causing her pain.  She has had some relief with the morphine although still has some discomfort.  Provided with Oxy IR with prescription for same.  Advised return to the ER if needed for pain control or further workup.         Final Clinical Impression(s) / ED Diagnoses Final diagnoses:  Pleural effusion  Malignant neoplasm of lung, unspecified laterality, unspecified part of lung (HCC)    Rx / DC Orders ED Discharge Orders          Ordered    oxyCODONE (OXY IR/ROXICODONE) 5  MG immediate release tablet  Every 4 hours PRN        04/09/23 2044              Jeannie Fend, PA-C 04/09/23 2120    Loetta Rough, MD 04/09/23 [phone]

## 2023-04-09 NOTE — Telephone Encounter (Signed)
sharp pain in her back when she takes a breath, 2nd episode . More lightheaded today, wheezing, no coughing,  Denies dizziness, fever .  I instructed pt to go to ED for evaluation.

## 2023-04-09 NOTE — ED Triage Notes (Signed)
Pt reports pain in left lung for a few days that is where her cancer is. Also having some SOB. Pain worse with breathing in. Pt undergoing chemo, completed radiation. Denies cough, fevers, n/v. States it feels similar to when she had PNA.

## 2023-04-15 ENCOUNTER — Inpatient Hospital Stay: Payer: Medicare Other

## 2023-04-15 ENCOUNTER — Other Ambulatory Visit: Payer: Medicare Other

## 2023-04-15 ENCOUNTER — Inpatient Hospital Stay (HOSPITAL_BASED_OUTPATIENT_CLINIC_OR_DEPARTMENT_OTHER): Payer: Medicare Other | Admitting: Internal Medicine

## 2023-04-15 ENCOUNTER — Inpatient Hospital Stay: Payer: Medicare Other | Attending: Internal Medicine

## 2023-04-15 ENCOUNTER — Other Ambulatory Visit: Payer: Self-pay

## 2023-04-15 ENCOUNTER — Telehealth: Payer: Self-pay | Admitting: Radiation Oncology

## 2023-04-15 VITALS — BP 141/49 | HR 94 | Temp 98.1°F | Resp 18

## 2023-04-15 DIAGNOSIS — C779 Secondary and unspecified malignant neoplasm of lymph node, unspecified: Secondary | ICD-10-CM | POA: Diagnosis not present

## 2023-04-15 DIAGNOSIS — F419 Anxiety disorder, unspecified: Secondary | ICD-10-CM | POA: Diagnosis not present

## 2023-04-15 DIAGNOSIS — Z7901 Long term (current) use of anticoagulants: Secondary | ICD-10-CM | POA: Insufficient documentation

## 2023-04-15 DIAGNOSIS — M7989 Other specified soft tissue disorders: Secondary | ICD-10-CM | POA: Insufficient documentation

## 2023-04-15 DIAGNOSIS — Z86718 Personal history of other venous thrombosis and embolism: Secondary | ICD-10-CM | POA: Insufficient documentation

## 2023-04-15 DIAGNOSIS — Z95828 Presence of other vascular implants and grafts: Secondary | ICD-10-CM

## 2023-04-15 DIAGNOSIS — J432 Centrilobular emphysema: Secondary | ICD-10-CM | POA: Diagnosis not present

## 2023-04-15 DIAGNOSIS — I872 Venous insufficiency (chronic) (peripheral): Secondary | ICD-10-CM | POA: Diagnosis not present

## 2023-04-15 DIAGNOSIS — C3490 Malignant neoplasm of unspecified part of unspecified bronchus or lung: Secondary | ICD-10-CM

## 2023-04-15 DIAGNOSIS — M47816 Spondylosis without myelopathy or radiculopathy, lumbar region: Secondary | ICD-10-CM | POA: Diagnosis not present

## 2023-04-15 DIAGNOSIS — J90 Pleural effusion, not elsewhere classified: Secondary | ICD-10-CM | POA: Diagnosis not present

## 2023-04-15 DIAGNOSIS — Z79631 Long term (current) use of antimetabolite agent: Secondary | ICD-10-CM | POA: Diagnosis not present

## 2023-04-15 DIAGNOSIS — Z79620 Long term (current) use of immunosuppressive biologic: Secondary | ICD-10-CM | POA: Insufficient documentation

## 2023-04-15 DIAGNOSIS — Z79899 Other long term (current) drug therapy: Secondary | ICD-10-CM | POA: Insufficient documentation

## 2023-04-15 DIAGNOSIS — Z5111 Encounter for antineoplastic chemotherapy: Secondary | ICD-10-CM | POA: Diagnosis present

## 2023-04-15 DIAGNOSIS — I700 Atherosclerosis of aorta: Secondary | ICD-10-CM | POA: Insufficient documentation

## 2023-04-15 DIAGNOSIS — M47814 Spondylosis without myelopathy or radiculopathy, thoracic region: Secondary | ICD-10-CM | POA: Diagnosis not present

## 2023-04-15 DIAGNOSIS — Z9089 Acquired absence of other organs: Secondary | ICD-10-CM | POA: Insufficient documentation

## 2023-04-15 DIAGNOSIS — R5383 Other fatigue: Secondary | ICD-10-CM | POA: Diagnosis not present

## 2023-04-15 DIAGNOSIS — Z87442 Personal history of urinary calculi: Secondary | ICD-10-CM | POA: Insufficient documentation

## 2023-04-15 DIAGNOSIS — C3412 Malignant neoplasm of upper lobe, left bronchus or lung: Secondary | ICD-10-CM | POA: Insufficient documentation

## 2023-04-15 DIAGNOSIS — Z9049 Acquired absence of other specified parts of digestive tract: Secondary | ICD-10-CM | POA: Insufficient documentation

## 2023-04-15 DIAGNOSIS — R0609 Other forms of dyspnea: Secondary | ICD-10-CM | POA: Diagnosis not present

## 2023-04-15 DIAGNOSIS — C7931 Secondary malignant neoplasm of brain: Secondary | ICD-10-CM | POA: Insufficient documentation

## 2023-04-15 DIAGNOSIS — I82531 Chronic embolism and thrombosis of right popliteal vein: Secondary | ICD-10-CM | POA: Diagnosis not present

## 2023-04-15 DIAGNOSIS — Z880 Allergy status to penicillin: Secondary | ICD-10-CM | POA: Insufficient documentation

## 2023-04-15 DIAGNOSIS — Z923 Personal history of irradiation: Secondary | ICD-10-CM | POA: Insufficient documentation

## 2023-04-15 DIAGNOSIS — M6281 Muscle weakness (generalized): Secondary | ICD-10-CM | POA: Insufficient documentation

## 2023-04-15 DIAGNOSIS — Z5112 Encounter for antineoplastic immunotherapy: Secondary | ICD-10-CM | POA: Insufficient documentation

## 2023-04-15 DIAGNOSIS — I82431 Acute embolism and thrombosis of right popliteal vein: Secondary | ICD-10-CM | POA: Diagnosis not present

## 2023-04-15 DIAGNOSIS — Z86711 Personal history of pulmonary embolism: Secondary | ICD-10-CM | POA: Diagnosis not present

## 2023-04-15 DIAGNOSIS — Z888 Allergy status to other drugs, medicaments and biological substances status: Secondary | ICD-10-CM | POA: Insufficient documentation

## 2023-04-15 LAB — CMP (CANCER CENTER ONLY)
ALT: 16 U/L (ref 0–44)
AST: 19 U/L (ref 15–41)
Albumin: 3.9 g/dL (ref 3.5–5.0)
Alkaline Phosphatase: 73 U/L (ref 38–126)
Anion gap: 8 (ref 5–15)
BUN: 13 mg/dL (ref 8–23)
CO2: 27 mmol/L (ref 22–32)
Calcium: 9.3 mg/dL (ref 8.9–10.3)
Chloride: 104 mmol/L (ref 98–111)
Creatinine: 0.69 mg/dL (ref 0.44–1.00)
GFR, Estimated: >60 mL/min (ref >60)
Glucose, Bld: 136 mg/dL — ABNORMAL HIGH (ref 70–99)
Potassium: 4 mmol/L (ref 3.5–5.1)
Sodium: 139 mmol/L (ref 135–145)
Total Bilirubin: 0.5 mg/dL (ref 0.3–1.2)
Total Protein: 6.9 g/dL (ref 6.5–8.1)

## 2023-04-15 LAB — CBC WITH DIFFERENTIAL (CANCER CENTER ONLY)
Abs Immature Granulocytes: 0.02 10*3/uL (ref 0.00–0.07)
Basophils Absolute: 0.1 10*3/uL (ref 0.0–0.1)
Basophils Relative: 1 %
Eosinophils Absolute: 0.1 10*3/uL (ref 0.0–0.5)
Eosinophils Relative: 2 %
HCT: 37.4 % (ref 36.0–46.0)
Hemoglobin: 12.1 g/dL (ref 12.0–15.0)
Immature Granulocytes: 1 %
Lymphocytes Relative: 18 %
Lymphs Abs: 0.8 10*3/uL (ref 0.7–4.0)
MCH: 34.2 pg — ABNORMAL HIGH (ref 26.0–34.0)
MCHC: 32.4 g/dL (ref 30.0–36.0)
MCV: 105.6 fL — ABNORMAL HIGH (ref 80.0–100.0)
Monocytes Absolute: 0.4 10*3/uL (ref 0.1–1.0)
Monocytes Relative: 10 %
Neutro Abs: 3 10*3/uL (ref 1.7–7.7)
Neutrophils Relative %: 68 %
Platelet Count: 277 10*3/uL (ref 150–400)
RBC: 3.54 MIL/uL — ABNORMAL LOW (ref 3.87–5.11)
RDW: 13.9 % (ref 11.5–15.5)
WBC Count: 4.3 10*3/uL (ref 4.0–10.5)
nRBC: 0 % (ref 0.0–0.2)

## 2023-04-15 MED ORDER — SODIUM CHLORIDE 0.9 % IV SOLN: Freq: Once | INTRAVENOUS | Status: AC

## 2023-04-15 MED ORDER — SODIUM CHLORIDE 0.9 % IV SOLN
400.0000 mg/m2 | Freq: Once | INTRAVENOUS | Status: AC
  Administered 2023-04-15: 800 mg via INTRAVENOUS
  Filled 2023-04-15: qty 20

## 2023-04-15 MED ORDER — SODIUM CHLORIDE 0.9% FLUSH
10.0000 mL | INTRAVENOUS | Status: DC | PRN
  Administered 2023-04-15: 10 mL

## 2023-04-15 MED ORDER — ONDANSETRON HCL 4 MG/2ML IJ SOLN
8.0000 mg | Freq: Once | INTRAMUSCULAR | Status: AC
  Administered 2023-04-15: 8 mg via INTRAVENOUS
  Filled 2023-04-15: qty 4

## 2023-04-15 MED ORDER — SODIUM CHLORIDE 0.9 % IV SOLN
200.0000 mg | Freq: Once | INTRAVENOUS | Status: AC
  Administered 2023-04-15: 200 mg via INTRAVENOUS
  Filled 2023-04-15: qty 200

## 2023-04-15 MED ORDER — SODIUM CHLORIDE 0.9% FLUSH
10.0000 mL | Freq: Once | INTRAVENOUS | Status: AC
  Administered 2023-04-15: 10 mL

## 2023-04-15 MED ORDER — HEPARIN SOD (PORK) LOCK FLUSH 100 UNIT/ML IV SOLN
500.0000 [IU] | Freq: Once | INTRAVENOUS | Status: AC | PRN
  Administered 2023-04-15: 500 [IU]

## 2023-04-15 NOTE — Telephone Encounter (Signed)
7/9 @ 1:20 am patient called to follow up with Dr. Roselind Messier due to a missed called.  She stated was not sure what date the call came in.  Just wanted to follow up.  Secure chat sent to [facility] so they are aware.

## 2023-04-15 NOTE — Patient Instructions (Addendum)
[facility]  Discharge Instructions: Thank you for choosing [facility] to provide your oncology and hematology care.   If you have a lab appointment with the Cancer Center, please go directly to the Cancer Center and check in at the registration area.   Wear comfortable clothing and clothing appropriate for easy access to any Portacath or PICC line.   We strive to give you quality time with your provider. You may need to reschedule your appointment if you arrive late (15 or more minutes).  Arriving late affects you and other patients whose appointments are after yours.  Also, if you miss three or more appointments without notifying the office, you may be dismissed from the clinic at the provider's discretion.      For prescription refill requests, have your pharmacy contact our office and allow 72 hours for refills to be completed.    Today you received the following chemotherapy and/or immunotherapy agents: pembrolizumab and pemetrexed      To help prevent nausea and vomiting after your treatment, we encourage you to take your nausea medication as directed.  BELOW ARE SYMPTOMS THAT SHOULD BE REPORTED IMMEDIATELY: *FEVER GREATER THAN 100.4 F (38 C) OR HIGHER *CHILLS OR SWEATING *NAUSEA AND VOMITING THAT IS NOT CONTROLLED WITH YOUR NAUSEA MEDICATION *UNUSUAL SHORTNESS OF BREATH *UNUSUAL BRUISING OR BLEEDING *URINARY PROBLEMS (pain or burning when urinating, or frequent urination) *BOWEL PROBLEMS (unusual diarrhea, constipation, pain near the anus) TENDERNESS IN MOUTH AND THROAT WITH OR WITHOUT PRESENCE OF ULCERS (sore throat, sores in mouth, or a toothache) UNUSUAL RASH, SWELLING OR PAIN  UNUSUAL VAGINAL DISCHARGE OR ITCHING   Items with * indicate a potential emergency and should be followed up as soon as possible or go to the Emergency Department if any problems should occur.  Please show the CHEMOTHERAPY ALERT CARD or IMMUNOTHERAPY  ALERT CARD at check-in to the Emergency Department and triage nurse.  Should you have questions after your visit or need to cancel or reschedule your appointment, please contact [facility]  Dept: [phone]  and follow the prompts.  Office hours are 8:00 a.m. to 4:30 p.m. Monday - Friday. Please note that voicemails left after 4:00 p.m. may not be returned until the following business day.  We are closed weekends and major holidays. You have access to a nurse at all times for urgent questions. Please call the main number to the clinic Dept: [phone] and follow the prompts.   For any non-urgent questions, you may also contact your provider using MyChart. We now offer e-Visits for anyone 49 and older to request care online for non-urgent symptoms. For details visit [email or web address].   Also download the MyChart app! Go to the app store, search "MyChart", open the app, select [facility], and log in with your MyChart username and password. Pembrolizumab Injection What is this medication? PEMBROLIZUMAB (PEM broe LIZ ue mab) treats some types of cancer. It works by helping your immune system slow or stop the spread of cancer cells. It is a monoclonal antibody. This medicine may be used for other purposes; ask your health care provider or pharmacist if you have questions. COMMON BRAND NAME(S): Keytruda What should I tell my care team before I take this medication? They need to know if you have any of these conditions: Allogeneic stem cell transplant (uses someone else's stem cells) Autoimmune diseases, such as Crohn disease, ulcerative colitis, lupus History of chest radiation  Nervous system problems, such as Guillain-Barre syndrome, myasthenia gravis Organ transplant An unusual or allergic reaction to pembrolizumab, other medications, foods, dyes, or preservatives Pregnant or trying to get pregnant Breast-feeding How should I use this  medication? This medication is injected into a vein. It is given by your care team in a hospital or clinic setting. A special MedGuide will be given to you before each treatment. Be sure to read this information carefully each time. Talk to your care team about the use of this medication in children. While it may be prescribed for children as young as 6 months for selected conditions, precautions do apply. Overdosage: If you think you have taken too much of this medicine contact a poison control center or emergency room at once. NOTE: This medicine is only for you. Do not share this medicine with others. What if I miss a dose? Keep appointments for follow-up doses. It is important not to miss your dose. Call your care team if you are unable to keep an appointment. What may interact with this medication? Interactions have not been studied. This list may not describe all possible interactions. Give your health care provider a list of all the medicines, herbs, non-prescription drugs, or dietary supplements you use. Also tell them if you smoke, drink alcohol, or use illegal drugs. Some items may interact with your medicine. What should I watch for while using this medication? Your condition will be monitored carefully while you are receiving this medication. You may need blood work while taking this medication. This medication may cause serious skin reactions. They can happen weeks to months after starting the medication. Contact your care team right away if you notice fevers or flu-like symptoms with a rash. The rash may be red or purple and then turn into blisters or peeling of the skin. You may also notice a red rash with swelling of the face, lips, or lymph nodes in your neck or under your arms. Tell your care team right away if you have any change in your eyesight. Talk to your care team if you may be pregnant. Serious birth defects can occur if you take this medication during pregnancy and for 4  months after the last dose. You will need a negative pregnancy test before starting this medication. Contraception is recommended while taking this medication and for 4 months after the last dose. Your care team can help you find the option that works for you. Do not breastfeed while taking this medication and for 4 months after the last dose. What side effects may I notice from receiving this medication? Side effects that you should report to your care team as soon as possible: Allergic reactions--skin rash, itching, hives, swelling of the face, lips, tongue, or throat Dry cough, shortness of breath or trouble breathing Eye pain, redness, irritation, or discharge with blurry or decreased vision Heart muscle inflammation--unusual weakness or fatigue, shortness of breath, chest pain, fast or irregular heartbeat, dizziness, swelling of the ankles, feet, or hands Hormone gland problems--headache, sensitivity to light, unusual weakness or fatigue, dizziness, fast or irregular heartbeat, increased sensitivity to cold or heat, excessive sweating, constipation, hair loss, increased thirst or amount of urine, tremors or shaking, irritability Infusion reactions--chest pain, shortness of breath or trouble breathing, feeling faint or lightheaded Kidney injury (glomerulonephritis)--decrease in the amount of urine, red or dark brown urine, foamy or bubbly urine, swelling of the ankles, hands, or feet Liver injury--right upper belly pain, loss of appetite, nausea, light-colored stool, dark yellow  or brown urine, yellowing skin or eyes, unusual weakness or fatigue Pain, tingling, or numbness in the hands or feet, muscle weakness, change in vision, confusion or trouble speaking, loss of balance or coordination, trouble walking, seizures Rash, fever, and swollen lymph nodes Redness, blistering, peeling, or loosening of the skin, including inside the mouth Sudden or severe stomach pain, bloody diarrhea, fever, nausea,  vomiting Side effects that usually do not require medical attention (report to your care team if they continue or are bothersome): Bone, joint, or muscle pain Diarrhea Fatigue Loss of appetite Nausea Skin rash This list may not describe all possible side effects. Call your doctor for medical advice about side effects. You may report side effects to FDA at 1-800-FDA-1088. Where should I keep my medication? This medication is given in a hospital or clinic. It will not be stored at home. NOTE: This sheet is a summary. It may not cover all possible information. If you have questions about this medicine, talk to your doctor, pharmacist, or health care provider.  2024 Elsevier/Gold Standard (2022-02-05 00:00:00) Pemetrexed Injection What is this medication? PEMETREXED (PEM e TREX ed) treats some types of cancer. It works by slowing down the growth of cancer cells. This medicine may be used for other purposes; ask your health care provider or pharmacist if you have questions. COMMON BRAND NAME(S): Alimta, PEMFEXY What should I tell my care team before I take this medication? They need to know if you have any of these conditions: Infection, such as chickenpox, cold sores, or herpes Kidney disease Low blood cell levels (white cells, red cells, and platelets) Lung or breathing disease, such as asthma Radiation therapy An unusual or allergic reaction to pemetrexed, other medications, foods, dyes, or preservatives If you or your partner are pregnant or trying to get pregnant Breast-feeding How should I use this medication? This medication is injected into a vein. It is given by your care team in a hospital or clinic setting. Talk to your care team about the use of this medication in children. Special care may be needed. Overdosage: If you think you have taken too much of this medicine contact a poison control center or emergency room at once. NOTE: This medicine is only for you. Do not share  this medicine with others. What if I miss a dose? Keep appointments for follow-up doses. It is important not to miss your dose. Call your care team if you are unable to keep an appointment. What may interact with this medication? Do not take this medication with any of the following: Live virus vaccines This medication may also interact with the following: Ibuprofen This list may not describe all possible interactions. Give your health care provider a list of all the medicines, herbs, non-prescription drugs, or dietary supplements you use. Also tell them if you smoke, drink alcohol, or use illegal drugs. Some items may interact with your medicine. What should I watch for while using this medication? Your condition will be monitored carefully while you are receiving this medication. This medication may make you feel generally unwell. This is not uncommon as chemotherapy can affect healthy cells as well as cancer cells. Report any side effects. Continue your course of treatment even though you feel ill unless your care team tells you to stop. This medication can cause serious side effects. To reduce the risk, your care team may give you other medications to take before receiving this one. Be sure to follow the directions from your care team. This medication  can cause a rash or redness in areas of the body that have previously had radiation therapy. If you have had radiation therapy, tell your care team if you notice a rash in this area. This medication may increase your risk of getting an infection. Call your care team for advice if you get a fever, chills, sore throat, or other symptoms of a cold or flu. Do not treat yourself. Try to avoid being around people who are sick. Be careful brushing or flossing your teeth or using a toothpick because you may get an infection or bleed more easily. If you have any dental work done, tell your dentist you are receiving this medication. Avoid taking medications  that contain aspirin, acetaminophen, ibuprofen, naproxen, or ketoprofen unless instructed by your care team. These medications may hide a fever. Check with your care team if you have severe diarrhea, nausea, and vomiting, or if you sweat a lot. The loss of too much body fluid may make it dangerous for you to take this medication. Talk to your care team if you or your partner wish to become pregnant or think either of you might be pregnant. This medication can cause serious birth defects if taken during pregnancy and for 6 months after the last dose. A negative pregnancy test is required before starting this medication. A reliable form of contraception is recommended while taking this medication and for 6 months after the last dose. Talk to your care team about reliable forms of contraception. Do not father a child while taking this medication and for 3 months after the last dose. Use a condom while having sex during this time period. Do not breastfeed while taking this medication and for 1 week after the last dose. This medication may cause infertility. Talk to your care team if you are concerned about your fertility. What side effects may I notice from receiving this medication? Side effects that you should report to your care team as soon as possible: Allergic reactions--skin rash, itching, hives, swelling of the face, lips, tongue, or throat Dry cough, shortness of breath or trouble breathing Infection--fever, chills, cough, sore throat, wounds that don't heal, pain or trouble when passing urine, general feeling of discomfort or being unwell Kidney injury--decrease in the amount of urine, swelling of the ankles, hands, or feet Low red blood cell level--unusual weakness or fatigue, dizziness, headache, trouble breathing Redness, blistering, peeling, or loosening of the skin, including inside the mouth Unusual bruising or bleeding Side effects that usually do not require medical attention (report to  your care team if they continue or are bothersome): Fatigue Loss of appetite Nausea Vomiting This list may not describe all possible side effects. Call your doctor for medical advice about side effects. You may report side effects to FDA at 1-800-FDA-1088. Where should I keep my medication? This medication is given in a hospital or clinic. It will not be stored at home. NOTE: This sheet is a summary. It may not cover all possible information. If you have questions about this medicine, talk to your doctor, pharmacist, or health care provider.  2024 Elsevier/Gold Standard (2022-01-29 00:00:00)

## 2023-04-15 NOTE — Progress Notes (Signed)
[facility] [phone]   [phone]  OFFICE PROGRESS NOTE  Si Gaul, MD [address] [place] [address]  DIAGNOSIS: Stage IV (T2 a, N3, M1b) non-small cell lung cancer, adenocarcinoma presented with left upper lobe perihilar mass in addition to left hilar and mediastinal lymphadenopathy as well as left supraclavicular lymphadenopathy with brain metastasis diagnosed in September 2023.   [name]  Detected Alteration(s) / Biomarker(s) Associated FDA-approved therapies Clinical Trial Availability % cfDNA or Amplification  KRAS G12C approved by FDA Adagrasib, Sotorasib Yes 1.7%  TP53 R249S None Yes 1.5%  PRIOR THERAPY: 1) SRS the the metastatic brain lesion under the care of Dr. Basilio Cairo 2) Craniotomy on 07/10/22 under the care of Dr. Jake Samples 3) radiation to the chest under care of Dr. Roselind Messier.  Last dose on 08/23/2022   CURRENT THERAPY: Systemic chemotherapy with carboplatin for an AUC of 5, to 500 mg/m2, and Keytruda 200 mg IV every 3 weeks.  First dose expected on October 08, 2022.  Status post 9 cycles.  Starting from cycle #5 she will be on maintenance treatment with Alimta and Keytruda every 3 weeks.  INTERVAL HISTORY: Laurie Mccoy 82 y.o. female returns to the clinic today for follow-up visit accompanied by her husband.  The patient is feeling fine today with no concerning complaints except for the baseline fatigue and shortness of breath.  She also has swelling of the right lower extremity more than left and she is currently on treatment with Eliquis for deep venous thrombosis.  She was seen at the emergency department last week because of left-sided chest pain and she had repeat CT of the chest that showed mild increase in the left pleural effusion with slight thickening of some of the pulmonary nodules.  She tolerated the last cycle of her treatment fairly well.  She denied having any fever or chills.  She has no nausea,  vomiting, diarrhea or constipation.  She has no headache or visual changes.  She is here today for evaluation before starting cycle #10 of her treatment.    MEDICAL HISTORY: Past Medical History:  Diagnosis Date   Anxiety    "had panic attacks years ago"   Asthma    COPD (chronic obstructive pulmonary disease) (HCC)    Goiter 2022   History of hiatal hernia    "dx in college, never bothered me"   History of kidney stones 2012   History of radiation therapy    Left Lung-07/10/22-08/23/22- Dr. Antony Blackbird   Hypertension    Lung cancer [facility])    Sleep apnea     ALLERGIES:  is allergic to compazine [prochlorperazine], amoxicillin, norvasc [amlodipine], and trelegy ellipta [fluticasone-umeclidin-vilant].  MEDICATIONS:  Current Outpatient Medications  Medication Sig Dispense Refill   albuterol (VENTOLIN HFA) 108 (90 Base) MCG/ACT inhaler Inhale 2 puffs into the lungs every 6 (six) hours as needed for wheezing or shortness of breath. 18 g 1   ALPRAZolam (XANAX) 0.25 MG tablet Take 0.25 mg by mouth at bedtime as needed for anxiety.     Budeson-Glycopyrrol-Formoterol (BREZTRI AEROSPHERE) 160-9-4.8 MCG/ACT AERO INHALE 2 PUFFS BY MOUTH EVERY MORNING AND EVERY NIGHT AT BEDTIME (Patient taking differently: Inhale 2 puffs into the lungs in the morning and at bedtime.) 10.7 g 5   ELIQUIS 5 MG TABS tablet Take 1 tablet (5 mg total) by mouth 2 (two) times daily. 60 tablet 3   folic acid (FOLVITE) 1 MG tablet Take 1 tablet (1 mg total)  by mouth daily. 30 tablet 2   furosemide (LASIX) 20 MG tablet 1 tablet p.o. daily as needed for increased leg swelling. (Patient not taking: Reported on 03/19/2023) 20 tablet 0   gabapentin (NEURONTIN) 100 MG capsule Take 1 capsule (100 mg total) by mouth 2 (two) times daily. 90 capsule 2   hydrOXYzine (ATARAX) 10 MG tablet Take 1 tablet (10 mg total) by mouth 3 (three) times daily as needed. 60 tablet 2   ipratropium-albuterol (DUONEB) 0.5-2.5 (3) MG/3ML SOLN Take 3  mLs by nebulization every 6 (six) hours as needed. 360 mL 0   levocetirizine (XYZAL) 5 MG tablet Take 5 mg by mouth at bedtime as needed (for seasonal allergies- if not taking generic Claritin).     lidocaine-prilocaine (EMLA) cream Apply 1 Application topically as needed. 30 g 2   LORazepam (ATIVAN) 1 MG tablet Take 1 tablet (1 mg total) by mouth as needed. 30 min Prior to MRI or brain radiation procedure (Patient not taking: Reported on 12/10/2022) 5 tablet 0   ondansetron (ZOFRAN) 8 MG tablet Take 1 tablet (8 mg total) by mouth every 8 (eight) hours as needed for nausea or vomiting. (Patient not taking: Reported on 12/10/2022) 30 tablet 2   oxyCODONE (OXY IR/ROXICODONE) 5 MG immediate release tablet Take 1 tablet (5 mg total) by mouth every 4 (four) hours as needed for severe pain. 12 tablet 0   pantoprazole (PROTONIX) 20 MG tablet Take 20 mg by mouth every evening.     telmisartan (MICARDIS) 20 MG tablet Take 1 tablet (20 mg total) by mouth daily. NEED OV. 15 tablet 0   traMADol (ULTRAM) 50 MG tablet Take 2 tablets (100 mg total) by mouth every 6 (six) hours as needed for severe pain. (Patient taking differently: Take 50 mg by mouth every 6 (six) hours as needed for severe pain.) 30 tablet 0   Zinc Acetate, Oral, (ZINC ACETATE PO) Take 1 tablet by mouth daily. Received from herbalist     No current facility-administered medications for this visit.    SURGICAL HISTORY:  Past Surgical History:  Procedure Laterality Date   APPENDECTOMY     APPLICATION OF CRANIAL NAVIGATION N/A 07/11/2022   Procedure: APPLICATION OF CRANIAL NAVIGATION;  Surgeon: Dawley, Alan Mulder, DO;  Location: [facility] OR;  Service: Neurosurgery;  Laterality: N/A;   BRONCHIAL BIOPSY  06/12/2021   Procedure: BRONCHIAL BIOPSIES;  Surgeon: Josephine Igo, DO;  Location: [facility];  Service: Pulmonary;;   BRONCHIAL BRUSHINGS  06/12/2021   Procedure: BRONCHIAL BRUSHINGS;  Surgeon: Josephine Igo, DO;  Location: [facility];  Service:  Pulmonary;;   BRONCHIAL WASHINGS  06/12/2021   Procedure: BRONCHIAL WASHINGS;  Surgeon: Josephine Igo, DO;  Location: [facility];  Service: Pulmonary;;   CHOLECYSTECTOMY     COLON RESECTION     CRANIOTOMY N/A 07/11/2022   Procedure: Craniotomy for resection of tumor;  Surgeon: Bethann Goo, DO;  Location: [facility] OR;  Service: Neurosurgery;  Laterality: N/A;  RM 19   FIDUCIAL MARKER PLACEMENT  06/12/2021   Procedure: FIDUCIAL MARKER PLACEMENT;  Surgeon: Josephine Igo, DO;  Location: [facility];  Service: Pulmonary;;   hernia     x 2   IR IMAGING GUIDED PORT INSERTION  10/11/2022   TONSILLECTOMY     VIDEO BRONCHOSCOPY WITH ENDOBRONCHIAL NAVIGATION Bilateral 06/12/2021   Procedure: VIDEO BRONCHOSCOPY WITH ENDOBRONCHIAL NAVIGATION;  Surgeon: Josephine Igo, DO;  Location: [facility];  Service: Pulmonary;  Laterality: Bilateral;  ION  VIDEO BRONCHOSCOPY WITH RADIAL ENDOBRONCHIAL ULTRASOUND  06/12/2021   Procedure: RADIAL ENDOBRONCHIAL ULTRASOUND;  Surgeon: Josephine Igo, DO;  Location: [facility];  Service: Pulmonary;;    REVIEW OF SYSTEMS:  Constitutional: positive for fatigue Eyes: negative Ears, nose, mouth, throat, and face: negative Respiratory: positive for dyspnea on exertion Cardiovascular: negative Gastrointestinal: negative Genitourinary:negative Integument/breast: negative Hematologic/lymphatic: negative Musculoskeletal:positive for muscle weakness Neurological: negative Behavioral/Psych: negative Endocrine: negative Allergic/Immunologic: negative   PHYSICAL EXAMINATION: General appearance: alert, cooperative, fatigued, and no distress Head: Normocephalic, without obvious abnormality, atraumatic Neck: no adenopathy, no JVD, supple, symmetrical, trachea midline, and thyroid not enlarged, symmetric, no tenderness/mass/nodules Lymph nodes: Cervical, supraclavicular, and axillary nodes normal. Resp: diminished breath sounds LLL and dullness to percussion  LLL Back: symmetric, no curvature. ROM normal. No CVA tenderness. Cardio: regular rate and rhythm, S1, S2 normal, no murmur, click, rub or gallop GI: soft, non-tender; bowel sounds normal; no masses,  no organomegaly Extremities: extremities normal, atraumatic, no cyanosis or edema Neurologic: Alert and oriented X 3, normal strength and tone. Normal symmetric reflexes. Normal coordination and gait  ECOG PERFORMANCE STATUS: 1 - Symptomatic but completely ambulatory  Blood pressure (!) 152/62, pulse 99, temperature 98.2 F (36.8 C), temperature source Oral, resp. rate 17, height 5\' 6"  (1.676 m), weight 202 lb 8 oz (91.9 kg), SpO2 97 %.  LABORATORY DATA: Lab Results  Component Value Date   WBC 4.3 04/15/2023   HGB 12.1 04/15/2023   HCT 37.4 04/15/2023   MCV 105.6 (H) 04/15/2023   PLT 277 04/15/2023      Chemistry      Component Value Date/Time   NA 139 04/09/2023 1741   NA 139 09/17/2019 1550   K 3.5 04/09/2023 1741   CL 104 04/09/2023 1741   CO2 25 04/09/2023 1741   BUN 18 04/09/2023 1741   BUN 9 09/17/2019 1550   CREATININE 0.58 04/09/2023 1741   CREATININE 0.65 03/25/2023 0834      Component Value Date/Time   CALCIUM 8.8 (L) 04/09/2023 1741   ALKPHOS 71 03/25/2023 0834   AST 20 03/25/2023 0834   ALT 19 03/25/2023 0834   BILITOT 0.6 03/25/2023 0834       RADIOGRAPHIC STUDIES: CT Angio Chest PE W/Cm &/Or Wo Cm  Result Date: 04/09/2023 CLINICAL DATA:  Stage IV lung cancer with shortness of breath associated with left-sided chest pain EXAM: CT ANGIOGRAPHY CHEST WITH CONTRAST TECHNIQUE: Multidetector CT imaging of the chest was performed using the standard protocol during bolus administration of intravenous contrast. Multiplanar CT image reconstructions and MIPs were obtained to evaluate the vascular anatomy. RADIATION DOSE REDUCTION: This exam was performed according to the departmental dose-optimization program which includes automated exposure control, adjustment of the  mA and/or kV according to patient size and/or use of iterative reconstruction technique. CONTRAST:  80mL OMNIPAQUE IOHEXOL 350 MG/ML SOLN COMPARISON:  CT chest dated 02/28/2023 FINDINGS: Cardiovascular: The study is high quality for the evaluation of pulmonary embolism. There are no filling defects in the central, lobar, segmental or subsegmental pulmonary artery branches to suggest acute pulmonary embolism. Great vessels are normal in course and caliber. Normal heart size. No significant pericardial fluid/thickening. Coronary artery calcifications and aortic atherosclerosis. Mediastinum/Nodes: Unchanged 2.4 cm left thyroid nodule (4:7), previously evaluated. No specific follow-up imaging recommended. Normal esophagus. No pathologically enlarged axillary, supraclavicular, mediastinal, or hilar lymph nodes. Lungs/Pleura: The central airways are patent. Upper lobe predominant centrilobular emphysema. Increased diffuse bronchial wall thickening. Known posterior left upper lobe nodule is not well  delineated from the adjacent atelectasis/consolidation. Similar appearance of posttreatment changes in the left upper lobe. Interval increased density of irregular right upper lobe part solid nodule with associated fiducial marker measuring 1.2 x 1.0 cm (12:60). New irregular subpleural left lower lobe nodule measures 5 x 5 mm (4:37). No pneumothorax. Increased moderate left pleural effusion. Upper abdomen: Cholecystectomy. Musculoskeletal: No acute or abnormal lytic or blastic osseous lesions. Multilevel degenerative changes of the partially imaged thoracic and lumbar spine. Review of the MIP images confirms the above findings. IMPRESSION: 1. No evidence of pulmonary embolism. 2. Increased moderate left pleural effusion. 3. Increased diffuse bronchial wall thickening, which can be seen in the setting of bronchitis. 4. Known posterior left upper lobe nodule is not well delineated from the adjacent atelectasis/consolidation.  Similar appearance of posttreatment changes in the left upper lobe. 5. Interval increased density of irregular right upper lobe part solid nodule with associated fiducial marker measuring 1.2 x 1.0 cm. New irregular subpleural left lower lobe nodule measures 5 mm. Attention on follow-up. 6. Aortic Atherosclerosis (ICD10-I70.0) and Emphysema (ICD10-J43.9). Coronary artery calcifications. Assessment for potential risk factor modification, dietary therapy or pharmacologic therapy may be warranted, if clinically indicated. Electronically Signed   By: Agustin Cree M.D.   On: 04/09/2023 19:38   DG Chest Port 1 View  Result Date: 04/09/2023 CLINICAL DATA:  CP EXAM: PORTABLE CHEST 1 VIEW COMPARISON:  Chest x-ray March 25, 2023. FINDINGS: Similar opacities in the left upper lung, compatible with known nodules and treatment changes. No new or worsening airspace opacities. No visible pleural effusions or pneumothorax. Right-sided Port-A-Cath in similar position. Right upper lobe surgical markers noted. IMPRESSION: Similar opacities in the left upper lung, compatible with known nodules and treatment changes. No new or worsening airspace opacities. Electronically Signed   By: Feliberto Harts M.D.   On: 04/09/2023 17:27   DG Chest 2 View  Result Date: 03/25/2023 CLINICAL DATA:  Shortness of breath.  History of lung cancer. EXAM: CHEST - 2 VIEW COMPARISON:  Chest radiograph 02/12/2023 FINDINGS: A right chest wall port is stable in position with the tip terminating in the right atrium. The heart is enlarged, unchanged. The upper mediastinal contours are stable. Patchy and reticular opacities are again seen in the left upper lobe, not significantly changed compared to the prior radiograph and consistent with the left upper lobe nodule and surrounding posttreatment changes seen on recent CT chest. Otherwise, there is no new or worsening focal airspace opacity. There is no pulmonary edema. There is no pleural effusion or  pneumothorax. A right upper lobe surgical marker is noted. There is no acute osseous abnormality. IMPRESSION: 1. Stable opacities in the left upper lobe compared to the prior radiograph, consistent with a left upper lobe nodule and surrounding treatment changes seen on recent CT chest. 2. No new or worsening focal airspace opacity. Electronically Signed   By: Lesia Hausen M.D.   On: 03/25/2023 12:51   VAS [place] LOWER EXTREMITY VENOUS REFLUX  Result Date: 03/17/2023  Lower Venous Reflux Study Patient Name:  GOLDEN HAVARD  Date of Exam:   03/17/2023 Medical Rec #: [id number]          Accession #:    [id number] Date of Birth: Sep 06, 1941           Patient Gender: F Patient Age:   82 years Exam Location:  [facility] Procedure:      VAS [place] LOWER EXTREMITY VENOUS REFLUX Referring Phys: Graceann Congress --------------------------------------------------------------------------------  Indications: Swelling.  Risk Factors: Cancer Hx lung Ca past pregnancy. Anticoagulation: Eliquis. Comparison Study: 12/11/22 RT LE DVT study - RT Chronic popliteal v DVT Performing Technologist: Lowell Guitar RVT, RDMS  Examination Guidelines: A complete evaluation includes B-mode imaging, spectral Doppler, color Doppler, and power Doppler as needed of all accessible portions of each vessel. Bilateral testing is considered an integral part of a complete examination. Limited examinations for reoccurring indications may be performed as noted. The reflux portion of the exam is performed with the patient in reverse Trendelenburg. Significant venous reflux is defined as >500 ms in the superficial venous system, and >1 second in the deep venous system.  Venous Reflux Times +--------------+---------+------+-----------+------------+----------------+ RIGHT         Reflux NoRefluxReflux TimeDiameter cmsComments                                 Yes                                           +--------------+---------+------+-----------+------------+----------------+ CFV                     yes   >1 second                              +--------------+---------+------+-----------+------------+----------------+ FV prox                 yes                                          +--------------+---------+------+-----------+------------+----------------+ FV mid        no                                                     +--------------+---------+------+-----------+------------+----------------+ FV dist       no                                                     +--------------+---------+------+-----------+------------+----------------+ Popliteal     no                                    chronic thrombus +--------------+---------+------+-----------+------------+----------------+ GSV at SFJ              yes    >500 ms      0.40                     +--------------+---------+------+-----------+------------+----------------+ GSV prox thigh          yes    >500 ms      0.66                     +--------------+---------+------+-----------+------------+----------------+ GSV mid thigh no  0.33                     +--------------+---------+------+-----------+------------+----------------+ GSV dist thighno                            0.39                     +--------------+---------+------+-----------+------------+----------------+ GSV at knee   no                            0.35    branching        +--------------+---------+------+-----------+------------+----------------+ GSV prox calf           yes    >500 ms      0.33                     +--------------+---------+------+-----------+------------+----------------+ SSV Pop Fossa no                            0.32                     +--------------+---------+------+-----------+------------+----------------+ SSV prox calf no                             0.56                     +--------------+---------+------+-----------+------------+----------------+ PFV                     yes  > 1 second                              +--------------+---------+------+-----------+------------+----------------+   Summary: Right: - Venous reflux is noted in the right common femoral vein. - Venous reflux is noted in the right profunda femoral vein. - Venous reflux is noted in the right sapheno-femoral junction. - Venous reflux is noted in the right greater saphenous vein in the thigh. - Venous reflux is noted in the right greater saphenous vein in the calf.  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  *See table(s) above for measurements and observations. Electronically signed by Coral Else MD on 03/17/2023 at 4:22:59 PM.    Final     ASSESSMENT AND PLAN: This is a very pleasant 82 years old white female with likely stage IV (T2a, N3, M1B) non-small cell lung cancer, adenocarcinoma presented with left upper lobe perihilar mass in addition to left hilar and mediastinal lymphadenopathy as well as left supraclavicular lymphadenopathy and suspicious solitary brain metastasis diagnosed in September 2023. The molecular studies showed positive KRAS G12C mutation The patient underwent SRS followed by craniotomy on July 10, 2022 to the solitary brain metastasis.  She also underwent palliative radiotherapy to the left upper lobe perihilar mass and mediastinal lymphadenopathy under the care of Dr. Roselind Messier. The patient is currently on systemic chemotherapy with carboplatin for AUC of 5, Alimta 500 Mg/M2 and Keytruda 200 Mg IV every 3 weeks.  First dose October 08, 2022.  Status post 9 cycles.  Starting from cycle #5 she will be on maintenance treatment with Alimta and Keytruda every 3 weeks. The patient has been tolerating this treatment well except for the fatigue. She is very  frustrated because she will not be cured of her disease but she understand from  day 1 that this is palliative treatment. I recommended for her to proceed with cycle #10 today as planned.  I also discussed with her discontinuing her treatment with Alimta and continues treatment with single agent Keytruda but the patient mentions that she is tolerating her treatment well and she would like to continue on the combination maintenance treatment. For the history of deep venous thrombosis, she will continue her current treatment with Eliquis for now. For the swelling of the lower extremities, she was evaluated by vascular surgery and she was advised to elevate her legs when sitting or sleeping. She will come back for follow-up visit in 3 weeks for evaluation before starting cycle #11. She was advised to call immediately if she has any other concerning symptoms in the interval.  The patient voices understanding of current disease status and treatment options and is in agreement with the current care plan.  All questions were answered. The patient knows to call the clinic with any problems, questions or concerns. We can certainly see the patient much sooner if necessary.  The total time spent in the appointment was 35 minutes.  Disclaimer: This note was dictated with voice recognition software. Similar sounding words can inadvertently be transcribed and may not be corrected upon review.

## 2023-04-18 NOTE — Progress Notes (Signed)
Laurie Mccoy presents today for follow-up after completing SRS to her brain on 10/23/2022  Pt had MRI on 04-29-23, we will review these results with pt.   Recent neurologic symptoms, if any:  Seizures: Denies Headaches: Reports occasional twinges/discomfort to the left posterior side of her head. States they don't last long and will resolve on their own Nausea: Reports mild nausea (especially after chemo treatments) and decreased appetite  Wt Readings from Last 3 Encounters:  05/02/23 200 lb 9.6 oz (91 kg)  04/15/23 202 lb 8 oz (91.9 kg)  04/09/23 205 lb 0.4 oz (93 kg)   Dizziness/ataxia: Reports feeling more off balanced, especially first thing in the morning; denies any recent falls Difficulty with hand coordination: Denies Focal numbness/weakness: Reports increased weakness to legs. States she has difficulty standing from a seated position Visual deficits/changes: Denies any new concerns Confusion/Memory deficits: Unchanged  Other issues of note: Continues to deal with edema and discomfort to her lower extremities (worse in right than left)--saw a vascular specalist and was told she has good circulation, so she is trying to manage with compression stockings and keeping her legs elevated.  --Last saw her medical oncologist Dr. Arbutus Ped on 04/15/2023:  The patient has been tolerating this treatment well except for the fatigue. She is very frustrated because she will not be cured of her disease but she understand from day 1 that this is palliative treatment. I recommended for her to proceed with cycle #10 today as planned.  I also discussed with her discontinuing her treatment with Alimta and continues treatment with single agent Keytruda but the patient mentions that she is tolerating her treatment well and she would like to continue on the combination maintenance treatment. For the history of deep venous thrombosis, she will continue her current treatment with Eliquis for now. For the  swelling of the lower extremities, she was evaluated by vascular surgery and she was advised to elevate her legs when sitting or sleeping. She will come back for follow-up visit in 3 weeks for evaluation before starting cycle #11.

## 2023-04-25 ENCOUNTER — Other Ambulatory Visit: Payer: Self-pay | Admitting: Radiation Oncology

## 2023-04-25 MED ORDER — LORAZEPAM 1 MG PO TABS: 1.0000 mg | ORAL_TABLET | ORAL | 0 refills | Status: AC | PRN

## 2023-04-29 ENCOUNTER — Other Ambulatory Visit: Payer: Self-pay | Admitting: Radiation Therapy

## 2023-04-29 ENCOUNTER — Ambulatory Visit
Admission: RE | Admit: 2023-04-29 | Discharge: 2023-04-29 | Disposition: A | Discharge status: Home / Self Care | Payer: Medicare Other | Source: Ambulatory Visit | Attending: Radiation Oncology | Admitting: Radiation Oncology

## 2023-04-29 DIAGNOSIS — C7931 Secondary malignant neoplasm of brain: Secondary | ICD-10-CM

## 2023-04-29 MED ORDER — HEPARIN SOD (PORK) LOCK FLUSH 100 UNIT/ML IV SOLN
500.0000 [IU] | Freq: Once | INTRAVENOUS | Status: AC
  Administered 2023-04-29: 500 [IU] via INTRAVENOUS

## 2023-04-29 MED ORDER — GADOPICLENOL 0.5 MMOL/ML IV SOLN
10.0000 mL | Freq: Once | INTRAVENOUS | Status: AC | PRN
  Administered 2023-04-29: 10 mL via INTRAVENOUS

## 2023-04-29 MED ORDER — SODIUM CHLORIDE 0.9% FLUSH
10.0000 mL | INTRAVENOUS | Status: DC | PRN
  Administered 2023-04-29: 10 mL via INTRAVENOUS

## 2023-05-02 ENCOUNTER — Ambulatory Visit
Admission: RE | Admit: 2023-05-02 | Discharge: 2023-05-02 | Disposition: A | Discharge status: Home / Self Care | Payer: Medicare Other | Source: Ambulatory Visit | Attending: Radiation Oncology | Admitting: Radiation Oncology

## 2023-05-02 ENCOUNTER — Other Ambulatory Visit: Payer: Self-pay

## 2023-05-02 VITALS — BP 112/72 | HR 93 | Temp 97.8°F | Resp 24 | Ht 66.0 in | Wt 200.6 lb

## 2023-05-02 DIAGNOSIS — I2510 Atherosclerotic heart disease of native coronary artery without angina pectoris: Secondary | ICD-10-CM | POA: Insufficient documentation

## 2023-05-02 DIAGNOSIS — C3412 Malignant neoplasm of upper lobe, left bronchus or lung: Secondary | ICD-10-CM | POA: Insufficient documentation

## 2023-05-02 DIAGNOSIS — I700 Atherosclerosis of aorta: Secondary | ICD-10-CM | POA: Insufficient documentation

## 2023-05-02 DIAGNOSIS — Z86718 Personal history of other venous thrombosis and embolism: Secondary | ICD-10-CM | POA: Insufficient documentation

## 2023-05-02 DIAGNOSIS — R5383 Other fatigue: Secondary | ICD-10-CM | POA: Diagnosis not present

## 2023-05-02 DIAGNOSIS — C7931 Secondary malignant neoplasm of brain: Secondary | ICD-10-CM | POA: Insufficient documentation

## 2023-05-02 DIAGNOSIS — Z923 Personal history of irradiation: Secondary | ICD-10-CM | POA: Diagnosis not present

## 2023-05-02 DIAGNOSIS — Z79899 Other long term (current) drug therapy: Secondary | ICD-10-CM | POA: Insufficient documentation

## 2023-05-02 DIAGNOSIS — R609 Edema, unspecified: Secondary | ICD-10-CM | POA: Insufficient documentation

## 2023-05-02 DIAGNOSIS — J90 Pleural effusion, not elsewhere classified: Secondary | ICD-10-CM | POA: Diagnosis not present

## 2023-05-02 MED ORDER — SONAFINE EX EMUL
1.0000 | Freq: Two times a day (BID) | CUTANEOUS | Status: DC
  Administered 2023-05-02: 1 via TOPICAL

## 2023-05-02 NOTE — Progress Notes (Signed)
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 Metastasis to brain (HCC)  C79.31 Sonafine emulsion 1 Application      CHIEF COMPLAINT: Here for MRI results  Narrative:   Laurie Mccoy presents today for follow-up after completing SRS to her brain on 10/23/2022  Pt had MRI on 04-29-23, we will review these results with pt.   Recent neurologic symptoms, if any:  Seizures: Denies Headaches: Reports occasional twinges/discomfort to the left posterior side of her head. States they don't last long and will resolve on their own Nausea: Reports mild nausea (especially after chemo treatments) and decreased appetite  Wt Readings from Last 3 Encounters:  05/02/23 200 lb 9.6 oz (91 kg)  04/15/23 202 lb 8 oz (91.9 kg)  04/09/23 205 lb 0.4 oz (93 kg)   Dizziness/ataxia: Reports feeling more off balanced, especially first thing in the morning; denies any recent falls Difficulty with hand coordination: Denies Focal numbness/weakness: Reports increased weakness to legs. States she has difficulty standing from a seated position Visual deficits/changes: Denies any new concerns Confusion/Memory deficits: Unchanged  Other issues of note: Continues to deal with edema and discomfort to her lower extremities (worse in right than left)--saw a vascular specalist and was told she has good circulation, so she is trying to manage with compression stockings and keeping her legs elevated.  --Last saw her medical oncologist Dr. Arbutus Ped on 04/15/2023:  The patient has been tolerating this treatment well except for the fatigue. She is very frustrated because she will not be cured of her disease but she understand from day 1 that  this is palliative treatment. I recommended for her to proceed with cycle #10 today as planned.  I also discussed with her discontinuing her treatment with Alimta and continues treatment with single agent Keytruda but the patient mentions that she is tolerating her treatment well and she would like to continue on the combination maintenance treatment. For the history of deep venous thrombosis, she will continue her current treatment with Eliquis for now. For the swelling of the lower extremities, she was evaluated by vascular surgery and she was advised to elevate her legs when sitting or sleeping. She will come back for follow-up visit in 3 weeks for evaluation before starting cycle #11.   ALLERGIES:  is allergic to compazine [prochlorperazine], norvasc [amlodipine], and trelegy ellipta [fluticasone-umeclidin-vilant].  Meds: Current Outpatient Medications  Medication Sig Dispense Refill   albuterol (VENTOLIN HFA) 108 (90 Base) MCG/ACT inhaler Inhale 2 puffs into the lungs every 6 (six) hours as needed for wheezing or shortness of breath. 18 g 1   ALPRAZolam (XANAX) 0.25 MG tablet Take 0.25 mg by mouth at bedtime as needed for anxiety.     Budeson-Glycopyrrol-Formoterol (BREZTRI AEROSPHERE) 160-9-4.8 MCG/ACT AERO INHALE 2 PUFFS BY MOUTH EVERY MORNING AND EVERY NIGHT AT BEDTIME (Patient taking differently: Inhale 2 puffs into the lungs in the morning and at bedtime.) 10.7 g 5   ELIQUIS 5 MG TABS tablet Take 1 tablet (5 mg total) by mouth 2 (two) times daily. 60 tablet 3   folic acid (FOLVITE) 1 MG tablet Take 1 tablet (1 mg total) by mouth daily. 30 tablet 2   furosemide (LASIX)  20 MG tablet 1 tablet p.o. daily as needed for increased leg swelling. (Patient not taking: Reported on 03/19/2023) 20 tablet 0   gabapentin (NEURONTIN) 100 MG capsule Take 1 capsule (100 mg total) by mouth 2 (two) times daily. 90 capsule 2   hydrOXYzine (ATARAX) 10 MG tablet Take 1 tablet (10 mg total) by mouth 3 (three)  times daily as needed. 60 tablet 2   ipratropium-albuterol (DUONEB) 0.5-2.5 (3) MG/3ML SOLN Take 3 mLs by nebulization every 6 (six) hours as needed. 360 mL 0   levocetirizine (XYZAL) 5 MG tablet Take 5 mg by mouth at bedtime as needed (for seasonal allergies- if not taking generic Claritin).     lidocaine-prilocaine (EMLA) cream Apply 1 Application topically as needed. 30 g 2   LORazepam (ATIVAN) 1 MG tablet Take 1 tablet (1 mg total) by mouth as needed. 30 min Prior to MRI or brain radiation procedure 5 tablet 0   ondansetron (ZOFRAN) 8 MG tablet Take 1 tablet (8 mg total) by mouth every 8 (eight) hours as needed for nausea or vomiting. (Patient not taking: Reported on 12/10/2022) 30 tablet 2   oxyCODONE (OXY IR/ROXICODONE) 5 MG immediate release tablet Take 1 tablet (5 mg total) by mouth every 4 (four) hours as needed for severe pain. 12 tablet 0   pantoprazole (PROTONIX) 20 MG tablet Take 20 mg by mouth every evening.     telmisartan (MICARDIS) 20 MG tablet Take 1 tablet (20 mg total) by mouth daily. NEED OV. 15 tablet 0   traMADol (ULTRAM) 50 MG tablet Take 2 tablets (100 mg total) by mouth every 6 (six) hours as needed for severe pain. (Patient taking differently: Take 50 mg by mouth every 6 (six) hours as needed for severe pain.) 30 tablet 0   Zinc Acetate, Oral, (ZINC ACETATE PO) Take 1 tablet by mouth daily. Received from herbalist     Current Facility-Administered Medications  Medication Dose Route Frequency Provider Last Rate Last Admin   Sonafine emulsion 1 Application  1 Application Topical BID Lonie Peak, MD   1 Application at 05/02/23 1209    Physical Findings: The patient is in no acute distress. Patient is alert and oriented.  height is 5\' 6"  (1.676 m) and weight is 200 lb 9.6 oz (91 kg). Her temperature is 97.8 F (36.6 C). Her blood pressure is 112/72 and her pulse is 93. Her respiration is 24 (abnormal) and oxygen saturation is 95%. .  No significant changes. General: Alert  and oriented, in no acute distress  HEENT: Head is normocephalic. Extraocular movements are intact. Oropharynx is clear. Musculoskeletal: using a wheelchair today. Well nourished. Strength symmetric Neurologic: Cranial nerves II through XII are grossly intact. No obvious focalities. Speech is fluent. Coordination is intact. Ext: ankle swelling Psychiatric: Judgment and insight are intact. Affect is appropriate. Some memory issues apparent  KPS = 60  100 - Normal; no complaints; no evidence of disease. 90   - Able to carry on normal activity; minor signs or symptoms of disease. 80   - Normal activity with effort; some signs or symptoms of disease. 34   - Cares for self; unable to carry on normal activity or to do active work. 60   - Requires occasional assistance, but is able to care for most of his personal needs. 50   - Requires considerable assistance and frequent medical care. 40   - Disabled; requires special care and assistance. 30   - Severely disabled; hospital admission is indicated  although death not imminent. 20   - Very sick; hospital admission necessary; active supportive treatment necessary. 10   - Moribund; fatal processes progressing rapidly. 0     - Dead  Karnofsky DA, Abelmann WH, Craver LS and Burchenal [facility] [phone]) The use of the nitrogen mustards in the palliative treatment of carcinoma: with particular reference to bronchogenic carcinoma Cancer 1 634-56  Lab Findings: Lab Results  Component Value Date   WBC 4.3 04/15/2023   HGB 12.1 04/15/2023   HCT 37.4 04/15/2023   MCV 105.6 (H) 04/15/2023   PLT 277 04/15/2023    Radiographic Findings: MR Brain W Wo Contrast  Result Date: 05/01/2023 CLINICAL DATA:  82 year old female with non-small cell lung cancer. Brain metastases treated with resection and SRS. Restaging. EXAM: MRI HEAD WITHOUT AND WITH CONTRAST TECHNIQUE: Multiplanar, multiecho pulse sequences of the brain and surrounding structures were obtained without  and with intravenous contrast. CONTRAST:  9 mL Vueway COMPARISON:  01/23/2023 brain MRI and earlier. FINDINGS: Study is intermittently degraded by motion artifact despite repeated imaging attempts. Brain: Subcentimeter left lateral periventricular enhancing lesion on series 14, image 103 remains stable. Chronic hemosiderin there on SWI. Mild FLAIR hyperintensity there appears stable without mass effect. No other abnormal intracranial enhancement is identified today. Right occipital pole resection cavity appears stable. Unchanged T2/FLAIR hyperintensity there with no regional mass effect. Wallace Cullens and white matter signal elsewhere appears stable. No restricted diffusion to suggest acute infarction. No midline shift, ventriculomegaly, extra-axial collection or acute intracranial hemorrhage. Cervicomedullary junction and pituitary are within normal limits. No dural thickening identified. Vascular: Major intracranial vascular flow voids are stable. Skull and upper cervical spine: Background bone marrow signal is within normal limits. Negative visible cervical spine. Stable post craniotomy changes. Sinuses/Orbits: Stable, negative. Other: Trace mastoid fluid has not significantly changed. Grossly normal visible internal auditory structures. Stable scalp and face. IMPRESSION: Motion degraded exam today. Solitary enhancing left lateral periventricular lesion is stable. Stable right occipital pole resection cavity. No new or progressed metastatic disease identified. Electronically Signed   By: Odessa Fleming M.D.   On: 05/01/2023 08:49   CT Angio Chest PE W/Cm &/Or Wo Cm  Result Date: 04/09/2023 CLINICAL DATA:  Stage IV lung cancer with shortness of breath associated with left-sided chest pain EXAM: CT ANGIOGRAPHY CHEST WITH CONTRAST TECHNIQUE: Multidetector CT imaging of the chest was performed using the standard protocol during bolus administration of intravenous contrast. Multiplanar CT image reconstructions and MIPs were  obtained to evaluate the vascular anatomy. RADIATION DOSE REDUCTION: This exam was performed according to the departmental dose-optimization program which includes automated exposure control, adjustment of the mA and/or kV according to patient size and/or use of iterative reconstruction technique. CONTRAST:  80mL OMNIPAQUE IOHEXOL 350 MG/ML SOLN COMPARISON:  CT chest dated 02/28/2023 FINDINGS: Cardiovascular: The study is high quality for the evaluation of pulmonary embolism. There are no filling defects in the central, lobar, segmental or subsegmental pulmonary artery branches to suggest acute pulmonary embolism. Great vessels are normal in course and caliber. Normal heart size. No significant pericardial fluid/thickening. Coronary artery calcifications and aortic atherosclerosis. Mediastinum/Nodes: Unchanged 2.4 cm left thyroid nodule (4:7), previously evaluated. No specific follow-up imaging recommended. Normal esophagus. No pathologically enlarged axillary, supraclavicular, mediastinal, or hilar lymph nodes. Lungs/Pleura: The central airways are patent. Upper lobe predominant centrilobular emphysema. Increased diffuse bronchial wall thickening. Known posterior left upper lobe nodule is not well delineated from the adjacent atelectasis/consolidation. Similar appearance of posttreatment changes in the left upper lobe. Interval  increased density of irregular right upper lobe part solid nodule with associated fiducial marker measuring 1.2 x 1.0 cm (12:60). New irregular subpleural left lower lobe nodule measures 5 x 5 mm (4:37). No pneumothorax. Increased moderate left pleural effusion. Upper abdomen: Cholecystectomy. Musculoskeletal: No acute or abnormal lytic or blastic osseous lesions. Multilevel degenerative changes of the partially imaged thoracic and lumbar spine. Review of the MIP images confirms the above findings. IMPRESSION: 1. No evidence of pulmonary embolism. 2. Increased moderate left pleural effusion.  3. Increased diffuse bronchial wall thickening, which can be seen in the setting of bronchitis. 4. Known posterior left upper lobe nodule is not well delineated from the adjacent atelectasis/consolidation. Similar appearance of posttreatment changes in the left upper lobe. 5. Interval increased density of irregular right upper lobe part solid nodule with associated fiducial marker measuring 1.2 x 1.0 cm. New irregular subpleural left lower lobe nodule measures 5 mm. Attention on follow-up. 6. Aortic Atherosclerosis (ICD10-I70.0) and Emphysema (ICD10-J43.9). Coronary artery calcifications. Assessment for potential risk factor modification, dietary therapy or pharmacologic therapy may be warranted, if clinically indicated. Electronically Signed   By: Agustin Cree M.D.   On: 04/09/2023 19:38   DG Chest Port 1 View  Result Date: 04/09/2023 CLINICAL DATA:  CP EXAM: PORTABLE CHEST 1 VIEW COMPARISON:  Chest x-ray March 25, 2023. FINDINGS: Similar opacities in the left upper lung, compatible with known nodules and treatment changes. No new or worsening airspace opacities. No visible pleural effusions or pneumothorax. Right-sided Port-A-Cath in similar position. Right upper lobe surgical markers noted. IMPRESSION: Similar opacities in the left upper lung, compatible with known nodules and treatment changes. No new or worsening airspace opacities. Electronically Signed   By: Feliberto Harts M.D.   On: 04/09/2023 17:27    Impression/Plan:  Patient is present with her husband today. She shows no sign of brain progression on MRI.  I personally reviewed her images with her.  She does not exhibit any concerning signs of radiation effects on clinical exam today.   She will follow-up with med onc in the near future and with Dr. Jake Samples in 40mo after next brain MRI.   She expressed gratitude for her care at the [facility].  On date of service, in total, I spent 20 minutes on this encounter. Patient was seen in person.   _____________________________________      Lonie Peak, MD

## 2023-05-05 ENCOUNTER — Inpatient Hospital Stay: Payer: Medicare Other

## 2023-05-06 ENCOUNTER — Inpatient Hospital Stay (HOSPITAL_BASED_OUTPATIENT_CLINIC_OR_DEPARTMENT_OTHER): Payer: Medicare Other | Admitting: Internal Medicine

## 2023-05-06 ENCOUNTER — Inpatient Hospital Stay: Payer: Medicare Other

## 2023-05-06 ENCOUNTER — Other Ambulatory Visit: Payer: Medicare Other

## 2023-05-06 ENCOUNTER — Other Ambulatory Visit: Payer: Self-pay

## 2023-05-06 VITALS — BP 163/71 | HR 95 | Temp 97.7°F | Resp 19 | Wt 203.8 lb

## 2023-05-06 DIAGNOSIS — Z5112 Encounter for antineoplastic immunotherapy: Secondary | ICD-10-CM | POA: Diagnosis not present

## 2023-05-06 DIAGNOSIS — Z95828 Presence of other vascular implants and grafts: Secondary | ICD-10-CM

## 2023-05-06 DIAGNOSIS — C3490 Malignant neoplasm of unspecified part of unspecified bronchus or lung: Secondary | ICD-10-CM

## 2023-05-06 LAB — CMP (CANCER CENTER ONLY)
ALT: 16 U/L (ref 0–44)
AST: 17 U/L (ref 15–41)
Albumin: 4 g/dL (ref 3.5–5.0)
Alkaline Phosphatase: 74 U/L (ref 38–126)
Anion gap: 7 (ref 5–15)
BUN: 9 mg/dL (ref 8–23)
CO2: 28 mmol/L (ref 22–32)
Calcium: 9.3 mg/dL (ref 8.9–10.3)
Chloride: 103 mmol/L (ref 98–111)
Creatinine: 0.63 mg/dL (ref 0.44–1.00)
GFR, Estimated: >60 mL/min (ref >60)
Glucose, Bld: 133 mg/dL — ABNORMAL HIGH (ref 70–99)
Potassium: 4 mmol/L (ref 3.5–5.1)
Sodium: 138 mmol/L (ref 135–145)
Total Bilirubin: 0.4 mg/dL (ref 0.3–1.2)
Total Protein: 6.7 g/dL (ref 6.5–8.1)

## 2023-05-06 LAB — CBC WITH DIFFERENTIAL (CANCER CENTER ONLY)
Abs Immature Granulocytes: 0 10*3/uL (ref 0.00–0.07)
Basophils Absolute: 0.1 10*3/uL (ref 0.0–0.1)
Basophils Relative: 2 %
Eosinophils Absolute: 0.1 10*3/uL (ref 0.0–0.5)
Eosinophils Relative: 1 %
HCT: 37 % (ref 36.0–46.0)
Hemoglobin: 12.1 g/dL (ref 12.0–15.0)
Immature Granulocytes: 0 %
Lymphocytes Relative: 16 %
Lymphs Abs: 0.6 10*3/uL — ABNORMAL LOW (ref 0.7–4.0)
MCH: 33.3 pg (ref 26.0–34.0)
MCHC: 32.7 g/dL (ref 30.0–36.0)
MCV: 101.9 fL — ABNORMAL HIGH (ref 80.0–100.0)
Monocytes Absolute: 0.4 10*3/uL (ref 0.1–1.0)
Monocytes Relative: 12 %
Neutro Abs: 2.4 10*3/uL (ref 1.7–7.7)
Neutrophils Relative %: 69 %
Platelet Count: 205 10*3/uL (ref 150–400)
RBC: 3.63 MIL/uL — ABNORMAL LOW (ref 3.87–5.11)
RDW: 13.9 % (ref 11.5–15.5)
WBC Count: 3.5 10*3/uL — ABNORMAL LOW (ref 4.0–10.5)
nRBC: 0 % (ref 0.0–0.2)

## 2023-05-06 MED ORDER — ONDANSETRON HCL 4 MG/2ML IJ SOLN
8.0000 mg | Freq: Once | INTRAMUSCULAR | Status: AC
  Administered 2023-05-06: 8 mg via INTRAVENOUS
  Filled 2023-05-06: qty 4

## 2023-05-06 MED ORDER — SODIUM CHLORIDE 0.9% FLUSH
10.0000 mL | Freq: Once | INTRAVENOUS | Status: AC
  Administered 2023-05-06: 10 mL

## 2023-05-06 MED ORDER — SODIUM CHLORIDE 0.9% FLUSH
10.0000 mL | INTRAVENOUS | Status: DC | PRN
  Administered 2023-05-06: 10 mL

## 2023-05-06 MED ORDER — SODIUM CHLORIDE 0.9 % IV SOLN
400.0000 mg/m2 | Freq: Once | INTRAVENOUS | Status: AC
  Administered 2023-05-06: 800 mg via INTRAVENOUS
  Filled 2023-05-06: qty 20

## 2023-05-06 MED ORDER — HEPARIN SOD (PORK) LOCK FLUSH 100 UNIT/ML IV SOLN
500.0000 [IU] | Freq: Once | INTRAVENOUS | Status: AC | PRN
  Administered 2023-05-06: 500 [IU]

## 2023-05-06 MED ORDER — SODIUM CHLORIDE 0.9 % IV SOLN
200.0000 mg | Freq: Once | INTRAVENOUS | Status: AC
  Administered 2023-05-06: 200 mg via INTRAVENOUS
  Filled 2023-05-06: qty 200

## 2023-05-06 MED ORDER — SODIUM CHLORIDE 0.9 % IV SOLN: Freq: Once | INTRAVENOUS | Status: AC

## 2023-05-06 NOTE — Progress Notes (Signed)
[facility] [phone]   [phone]  OFFICE PROGRESS NOTE  Si Gaul, MD [address] [place] [address]  DIAGNOSIS: Stage IV (T2 a, N3, M1b) non-small cell lung cancer, adenocarcinoma presented with left upper lobe perihilar mass in addition to left hilar and mediastinal lymphadenopathy as well as left supraclavicular lymphadenopathy with brain metastasis diagnosed in September 2023.   [name]  Detected Alteration(s) / Biomarker(s) Associated FDA-approved therapies Clinical Trial Availability % cfDNA or Amplification  KRAS G12C approved by FDA Adagrasib, Sotorasib Yes 1.7%  TP53 R249S None Yes 1.5%  PRIOR THERAPY: 1) SRS the the metastatic brain lesion under the care of Dr. Basilio Cairo 2) Craniotomy on 07/10/22 under the care of Dr. Jake Samples 3) radiation to the chest under care of Dr. Roselind Messier.  Last dose on 08/23/2022   CURRENT THERAPY: Systemic chemotherapy with carboplatin for an AUC of 5, to 500 mg/m2, and Keytruda 200 mg IV every 3 weeks.  First dose expected on October 08, 2022.  Status post 10 cycles.  Starting from cycle #5 she will be on maintenance treatment with Alimta and Keytruda every 3 weeks.  INTERVAL HISTORY: [place] 82 y.o. female returns to the clinic today for follow-up visit accompanied by her husband.  The patient is feeling fine except for the fatigue and some memory issues.  She always forget our previous discussion about her disease status and the goal of treatment which is palliative.  She thinks that all what is left in her body are scar tissues.  She does not remember that she had metastatic disease to the hilar, supraclavicular and mediastinal lymph nodes.  She is under the impression that this treatment is for cure of her cancer which we have discussed several times in the past that the goal of the treatment is palliative in nature.  She denied having any current chest pain, but has shortness of breath  with exertion and mild cough with no hemoptysis.  She denied having any nausea, vomiting, diarrhea or constipation.  She has no headache or visual changes.  She is here today for evaluation before starting cycle #11.    MEDICAL HISTORY: Past Medical History:  Diagnosis Date   Anxiety    "had panic attacks years ago"   Asthma    COPD (chronic obstructive pulmonary disease) (HCC)    Goiter 2022   History of hiatal hernia    "dx in college, never bothered me"   History of kidney stones 2012   History of radiation therapy    Left Lung-07/10/22-08/23/22- Dr. Antony Blackbird   Hypertension    Lung cancer [facility])    Sleep apnea     ALLERGIES:  is allergic to compazine [prochlorperazine], norvasc [amlodipine], and trelegy ellipta [fluticasone-umeclidin-vilant].  MEDICATIONS:  Current Outpatient Medications  Medication Sig Dispense Refill   albuterol (VENTOLIN HFA) 108 (90 Base) MCG/ACT inhaler Inhale 2 puffs into the lungs every 6 (six) hours as needed for wheezing or shortness of breath. 18 g 1   ALPRAZolam (XANAX) 0.25 MG tablet Take 0.25 mg by mouth at bedtime as needed for anxiety.     Budeson-Glycopyrrol-Formoterol (BREZTRI AEROSPHERE) 160-9-4.8 MCG/ACT AERO INHALE 2 PUFFS BY MOUTH EVERY MORNING AND EVERY NIGHT AT BEDTIME (Patient taking differently: Inhale 2 puffs into the lungs in the morning and at bedtime.) 10.7 g 5   ELIQUIS 5 MG TABS tablet Take 1 tablet (5 mg total) by mouth 2 (two) times daily. 60 tablet 3  folic acid (FOLVITE) 1 MG tablet Take 1 tablet (1 mg total) by mouth daily. 30 tablet 2   furosemide (LASIX) 20 MG tablet 1 tablet p.o. daily as needed for increased leg swelling. (Patient not taking: Reported on 03/19/2023) 20 tablet 0   gabapentin (NEURONTIN) 100 MG capsule Take 1 capsule (100 mg total) by mouth 2 (two) times daily. 90 capsule 2   hydrOXYzine (ATARAX) 10 MG tablet Take 1 tablet (10 mg total) by mouth 3 (three) times daily as needed. 60 tablet 2    ipratropium-albuterol (DUONEB) 0.5-2.5 (3) MG/3ML SOLN Take 3 mLs by nebulization every 6 (six) hours as needed. 360 mL 0   levocetirizine (XYZAL) 5 MG tablet Take 5 mg by mouth at bedtime as needed (for seasonal allergies- if not taking generic Claritin).     lidocaine-prilocaine (EMLA) cream Apply 1 Application topically as needed. 30 g 2   LORazepam (ATIVAN) 1 MG tablet Take 1 tablet (1 mg total) by mouth as needed. 30 min Prior to MRI or brain radiation procedure 5 tablet 0   ondansetron (ZOFRAN) 8 MG tablet Take 1 tablet (8 mg total) by mouth every 8 (eight) hours as needed for nausea or vomiting. (Patient not taking: Reported on 12/10/2022) 30 tablet 2   oxyCODONE (OXY IR/ROXICODONE) 5 MG immediate release tablet Take 1 tablet (5 mg total) by mouth every 4 (four) hours as needed for severe pain. 12 tablet 0   pantoprazole (PROTONIX) 20 MG tablet Take 20 mg by mouth every evening.     telmisartan (MICARDIS) 20 MG tablet Take 1 tablet (20 mg total) by mouth daily. NEED OV. 15 tablet 0   traMADol (ULTRAM) 50 MG tablet Take 2 tablets (100 mg total) by mouth every 6 (six) hours as needed for severe pain. (Patient taking differently: Take 50 mg by mouth every 6 (six) hours as needed for severe pain.) 30 tablet 0   Zinc Acetate, Oral, (ZINC ACETATE PO) Take 1 tablet by mouth daily. Received from herbalist     No current facility-administered medications for this visit.    SURGICAL HISTORY:  Past Surgical History:  Procedure Laterality Date   APPENDECTOMY     APPLICATION OF CRANIAL NAVIGATION N/A 07/11/2022   Procedure: APPLICATION OF CRANIAL NAVIGATION;  Surgeon: Dawley, Alan Mulder, DO;  Location: [facility] OR;  Service: Neurosurgery;  Laterality: N/A;   BRONCHIAL BIOPSY  06/12/2021   Procedure: BRONCHIAL BIOPSIES;  Surgeon: Josephine Igo, DO;  Location: [facility] ENDOSCOPY;  Service: Pulmonary;;   BRONCHIAL BRUSHINGS  06/12/2021   Procedure: BRONCHIAL BRUSHINGS;  Surgeon: Josephine Igo, DO;  Location: [facility]  ENDOSCOPY;  Service: Pulmonary;;   BRONCHIAL WASHINGS  06/12/2021   Procedure: BRONCHIAL WASHINGS;  Surgeon: Josephine Igo, DO;  Location: [facility] ENDOSCOPY;  Service: Pulmonary;;   CHOLECYSTECTOMY     COLON RESECTION     CRANIOTOMY N/A 07/11/2022   Procedure: Craniotomy for resection of tumor;  Surgeon: Bethann Goo, DO;  Location: [facility] OR;  Service: Neurosurgery;  Laterality: N/A;  RM 19   FIDUCIAL MARKER PLACEMENT  06/12/2021   Procedure: FIDUCIAL MARKER PLACEMENT;  Surgeon: Josephine Igo, DO;  Location: [facility] ENDOSCOPY;  Service: Pulmonary;;   hernia     x 2   IR IMAGING GUIDED PORT INSERTION  10/11/2022   TONSILLECTOMY     VIDEO BRONCHOSCOPY WITH ENDOBRONCHIAL NAVIGATION Bilateral 06/12/2021   Procedure: VIDEO BRONCHOSCOPY WITH ENDOBRONCHIAL NAVIGATION;  Surgeon: Josephine Igo, DO;  Location: [facility] ENDOSCOPY;  Service: Pulmonary;  Laterality: Bilateral;  ION   VIDEO BRONCHOSCOPY WITH RADIAL ENDOBRONCHIAL ULTRASOUND  06/12/2021   Procedure: RADIAL ENDOBRONCHIAL ULTRASOUND;  Surgeon: Josephine Igo, DO;  Location: [facility] ENDOSCOPY;  Service: Pulmonary;;    REVIEW OF SYSTEMS:  Constitutional: positive for fatigue Eyes: negative Ears, nose, mouth, throat, and face: negative Respiratory: positive for dyspnea on exertion Cardiovascular: negative Gastrointestinal: negative Genitourinary:negative Integument/breast: negative Hematologic/lymphatic: negative Musculoskeletal:positive for muscle weakness Neurological: positive for memory problems Behavioral/Psych: negative Endocrine: negative Allergic/Immunologic: negative   PHYSICAL EXAMINATION: General appearance: alert, cooperative, fatigued, and no distress Head: Normocephalic, without obvious abnormality, atraumatic Neck: no adenopathy, no JVD, supple, symmetrical, trachea midline, and thyroid not enlarged, symmetric, no tenderness/mass/nodules Lymph nodes: Cervical, supraclavicular, and axillary nodes normal. Resp: rales LUL Back:  symmetric, no curvature. ROM normal. No CVA tenderness. Cardio: regular rate and rhythm, S1, S2 normal, no murmur, click, rub or gallop GI: soft, non-tender; bowel sounds normal; no masses,  no organomegaly Extremities: extremities normal, atraumatic, no cyanosis or edema Neurologic: Alert and oriented X 3, normal strength and tone. Normal symmetric reflexes. Normal coordination and gait  ECOG PERFORMANCE STATUS: 1 - Symptomatic but completely ambulatory  Blood pressure (!) 163/71, pulse 95, temperature 97.7 F (36.5 C), temperature source Temporal, resp. rate 19, weight 203 lb 12.8 oz (92.4 kg), SpO2 97%.  LABORATORY DATA: Lab Results  Component Value Date   WBC 3.5 (L) 05/06/2023   HGB 12.1 05/06/2023   HCT 37.0 05/06/2023   MCV 101.9 (H) 05/06/2023   PLT 205 05/06/2023      Chemistry      Component Value Date/Time   NA 139 04/15/2023 1010   NA 139 09/17/2019 1550   K 4.0 04/15/2023 1010   CL 104 04/15/2023 1010   CO2 27 04/15/2023 1010   BUN 13 04/15/2023 1010   BUN 9 09/17/2019 1550   CREATININE 0.69 04/15/2023 1010      Component Value Date/Time   CALCIUM 9.3 04/15/2023 1010   ALKPHOS 73 04/15/2023 1010   AST 19 04/15/2023 1010   ALT 16 04/15/2023 1010   BILITOT 0.5 04/15/2023 1010       RADIOGRAPHIC STUDIES: MR Brain W Wo Contrast  Result Date: 05/01/2023 CLINICAL DATA:  82 year old female with non-small cell lung cancer. Brain metastases treated with resection and SRS. Restaging. EXAM: MRI HEAD WITHOUT AND WITH CONTRAST TECHNIQUE: Multiplanar, multiecho pulse sequences of the brain and surrounding structures were obtained without and with intravenous contrast. CONTRAST:  9 mL Vueway COMPARISON:  01/23/2023 brain MRI and earlier. FINDINGS: Study is intermittently degraded by motion artifact despite repeated imaging attempts. Brain: Subcentimeter left lateral periventricular enhancing lesion on series 14, image 103 remains stable. Chronic hemosiderin there on SWI.  Mild FLAIR hyperintensity there appears stable without mass effect. No other abnormal intracranial enhancement is identified today. Right occipital pole resection cavity appears stable. Unchanged T2/FLAIR hyperintensity there with no regional mass effect. Wallace Cullens and white matter signal elsewhere appears stable. No restricted diffusion to suggest acute infarction. No midline shift, ventriculomegaly, extra-axial collection or acute intracranial hemorrhage. Cervicomedullary junction and pituitary are within normal limits. No dural thickening identified. Vascular: Major intracranial vascular flow voids are stable. Skull and upper cervical spine: Background bone marrow signal is within normal limits. Negative visible cervical spine. Stable post craniotomy changes. Sinuses/Orbits: Stable, negative. Other: Trace mastoid fluid has not significantly changed. Grossly normal visible internal auditory structures. Stable scalp and face. IMPRESSION: Motion degraded exam today. Solitary enhancing left lateral periventricular lesion is stable. Stable right  occipital pole resection cavity. No new or progressed metastatic disease identified. Electronically Signed   By: Odessa Fleming M.D.   On: 05/01/2023 08:49   CT Angio Chest PE W/Cm &/Or Wo Cm  Result Date: 04/09/2023 CLINICAL DATA:  Stage IV lung cancer with shortness of breath associated with left-sided chest pain EXAM: CT ANGIOGRAPHY CHEST WITH CONTRAST TECHNIQUE: Multidetector CT imaging of the chest was performed using the standard protocol during bolus administration of intravenous contrast. Multiplanar CT image reconstructions and MIPs were obtained to evaluate the vascular anatomy. RADIATION DOSE REDUCTION: This exam was performed according to the departmental dose-optimization program which includes automated exposure control, adjustment of the mA and/or kV according to patient size and/or use of iterative reconstruction technique. CONTRAST:  80mL OMNIPAQUE IOHEXOL 350 MG/ML  SOLN COMPARISON:  CT chest dated 02/28/2023 FINDINGS: Cardiovascular: The study is high quality for the evaluation of pulmonary embolism. There are no filling defects in the central, lobar, segmental or subsegmental pulmonary artery branches to suggest acute pulmonary embolism. Great vessels are normal in course and caliber. Normal heart size. No significant pericardial fluid/thickening. Coronary artery calcifications and aortic atherosclerosis. Mediastinum/Nodes: Unchanged 2.4 cm left thyroid nodule (4:7), previously evaluated. No specific follow-up imaging recommended. Normal esophagus. No pathologically enlarged axillary, supraclavicular, mediastinal, or hilar lymph nodes. Lungs/Pleura: The central airways are patent. Upper lobe predominant centrilobular emphysema. Increased diffuse bronchial wall thickening. Known posterior left upper lobe nodule is not well delineated from the adjacent atelectasis/consolidation. Similar appearance of posttreatment changes in the left upper lobe. Interval increased density of irregular right upper lobe part solid nodule with associated fiducial marker measuring 1.2 x 1.0 cm (12:60). New irregular subpleural left lower lobe nodule measures 5 x 5 mm (4:37). No pneumothorax. Increased moderate left pleural effusion. Upper abdomen: Cholecystectomy. Musculoskeletal: No acute or abnormal lytic or blastic osseous lesions. Multilevel degenerative changes of the partially imaged thoracic and lumbar spine. Review of the MIP images confirms the above findings. IMPRESSION: 1. No evidence of pulmonary embolism. 2. Increased moderate left pleural effusion. 3. Increased diffuse bronchial wall thickening, which can be seen in the setting of bronchitis. 4. Known posterior left upper lobe nodule is not well delineated from the adjacent atelectasis/consolidation. Similar appearance of posttreatment changes in the left upper lobe. 5. Interval increased density of irregular right upper lobe part  solid nodule with associated fiducial marker measuring 1.2 x 1.0 cm. New irregular subpleural left lower lobe nodule measures 5 mm. Attention on follow-up. 6. Aortic Atherosclerosis (ICD10-I70.0) and Emphysema (ICD10-J43.9). Coronary artery calcifications. Assessment for potential risk factor modification, dietary therapy or pharmacologic therapy may be warranted, if clinically indicated. Electronically Signed   By: Agustin Cree M.D.   On: 04/09/2023 19:38   DG Chest Port 1 View  Result Date: 04/09/2023 CLINICAL DATA:  CP EXAM: PORTABLE CHEST 1 VIEW COMPARISON:  Chest x-ray March 25, 2023. FINDINGS: Similar opacities in the left upper lung, compatible with known nodules and treatment changes. No new or worsening airspace opacities. No visible pleural effusions or pneumothorax. Right-sided Port-A-Cath in similar position. Right upper lobe surgical markers noted. IMPRESSION: Similar opacities in the left upper lung, compatible with known nodules and treatment changes. No new or worsening airspace opacities. Electronically Signed   By: Feliberto Harts M.D.   On: 04/09/2023 17:27    ASSESSMENT AND PLAN: This is a very pleasant 82 years old white female with likely stage IV (T2a, N3, M1B) non-small cell lung cancer, adenocarcinoma presented with left upper lobe perihilar  mass in addition to left hilar and mediastinal lymphadenopathy as well as left supraclavicular lymphadenopathy and suspicious solitary brain metastasis diagnosed in September 2023. The molecular studies showed positive KRAS G12C mutation The patient underwent SRS followed by craniotomy on July 10, 2022 to the solitary brain metastasis.  She also underwent palliative radiotherapy to the left upper lobe perihilar mass and mediastinal lymphadenopathy under the care of Dr. Roselind Messier. The patient is currently on systemic chemotherapy with carboplatin for AUC of 5, Alimta 500 Mg/M2 and Keytruda 200 Mg IV every 3 weeks.  First dose October 08, 2022.  Status  post 10 cycles.  Starting from cycle #5 she will be on maintenance treatment with Alimta and Keytruda every 3 weeks. The patient has been tolerating the treatment well except for the fatigue. I recommended for her to proceed with cycle #11 today as planned. I had a lengthy discussion with the patient and her husband today about her current condition and I reviewed the initial imaging studies and showed the patient the sites of metastatic disease involving the lymph nodes. I explained to the patient again that this treatment is palliative in nature and I hope she will be cured but this is not likely. She would like to continue with her treatment as planned but she would like to skip her treatment in September because she is going out of town with the family. She will come back for follow-up visit in 3 weeks for evaluation before starting cycle #12. For the history of deep venous thrombosis, she will continue her current treatment with Eliquis for now. For the swelling of the lower extremities, she was evaluated by vascular surgery and she was advised to elevate her legs when sitting or sleeping. The patient was advised to call immediately if she has any other concerning symptoms in the interval.  The patient voices understanding of current disease status and treatment options and is in agreement with the current care plan.  All questions were answered. The patient knows to call the clinic with any problems, questions or concerns. We can certainly see the patient much sooner if necessary.  The total time spent in the appointment was 35 minutes.  Disclaimer: This note was dictated with voice recognition software. Similar sounding words can inadvertently be transcribed and may not be corrected upon review.

## 2023-05-06 NOTE — Patient Instructions (Signed)
[facility]  Discharge Instructions: Thank you for choosing [facility] to provide your oncology and hematology care.   If you have a lab appointment with the [facility], please go directly to the [facility] and check in at the registration area.   Wear comfortable clothing and clothing appropriate for easy access to any Portacath or PICC line.   We strive to give you quality time with your provider. You may need to reschedule your appointment if you arrive late (15 or more minutes).  Arriving late affects you and other patients whose appointments are after yours.  Also, if you miss three or more appointments without notifying the office, you may be dismissed from the clinic at the provider's discretion.      For prescription refill requests, have your pharmacy contact our office and allow 72 hours for refills to be completed.    Today you received the following chemotherapy and/or immunotherapy agents: Keytruda, Alimta.       To help prevent nausea and vomiting after your treatment, we encourage you to take your nausea medication as directed.  BELOW ARE SYMPTOMS THAT SHOULD BE REPORTED IMMEDIATELY: *FEVER GREATER THAN 100.4 F (38 C) OR HIGHER *CHILLS OR SWEATING *NAUSEA AND VOMITING THAT IS NOT CONTROLLED WITH YOUR NAUSEA MEDICATION *UNUSUAL SHORTNESS OF BREATH *UNUSUAL BRUISING OR BLEEDING *URINARY PROBLEMS (pain or burning when urinating, or frequent urination) *BOWEL PROBLEMS (unusual diarrhea, constipation, pain near the anus) TENDERNESS IN MOUTH AND THROAT WITH OR WITHOUT PRESENCE OF ULCERS (sore throat, sores in mouth, or a toothache) UNUSUAL RASH, SWELLING OR PAIN  UNUSUAL VAGINAL DISCHARGE OR ITCHING   Items with * indicate a potential emergency and should be followed up as soon as possible or go to the Emergency Department if any problems should occur.  Please show the CHEMOTHERAPY ALERT CARD or IMMUNOTHERAPY ALERT CARD  at check-in to the Emergency Department and triage nurse.  Should you have questions after your visit or need to cancel or reschedule your appointment, please contact [facility]  Dept: [phone]  and follow the prompts.  Office hours are 8:00 a.m. to 4:30 p.m. Monday - Friday. Please note that voicemails left after 4:00 p.m. may not be returned until the following business day.  We are closed weekends and major holidays. You have access to a nurse at all times for urgent questions. Please call the main number to the clinic Dept: [phone] and follow the prompts.   For any non-urgent questions, you may also contact your provider using MyChart. We now offer e-Visits for anyone 34 and older to request care online for non-urgent symptoms. For details visit [email or web address].   Also download the MyChart app! Go to the app store, search "MyChart", open the app, select [facility], and log in with your MyChart username and password.

## 2023-05-07 ENCOUNTER — Other Ambulatory Visit: Payer: Self-pay | Admitting: Radiation Therapy

## 2023-05-07 DIAGNOSIS — C7931 Secondary malignant neoplasm of brain: Secondary | ICD-10-CM

## 2023-05-07 NOTE — Progress Notes (Signed)
Order entered to access and de-access port the day of brain MRI at [facility]. Scan planned 10/29 or 08/06/23  Jalene Mullet R.T.(R)(T) Radiation Special Procedures Navigator

## 2023-05-08 ENCOUNTER — Other Ambulatory Visit: Payer: Self-pay

## 2023-05-19 ENCOUNTER — Encounter: Payer: Self-pay | Admitting: Internal Medicine

## 2023-05-21 ENCOUNTER — Other Ambulatory Visit: Payer: Self-pay | Admitting: Internal Medicine

## 2023-05-21 MED ORDER — METHYLPREDNISOLONE 4 MG PO TBPK: ORAL_TABLET | ORAL | 0 refills | Status: DC

## 2023-05-23 NOTE — Progress Notes (Unsigned)
[facility] OFFICE PROGRESS NOTE  Si Gaul, MD [address] [place] [address]  DIAGNOSIS: Stage IV (T2 a, N3, M1b) non-small cell lung cancer, adenocarcinoma presented with left upper lobe perihilar mass in addition to left hilar and mediastinal lymphadenopathy as well as left supraclavicular lymphadenopathy with brain metastasis diagnosed in September 2023.   [name]   Detected Alteration(s) / Biomarker(s)          Associated FDA-approved therapies  Clinical Trial Availability          % cfDNA or Amplification   KRAS G12C approved by FDA Adagrasib, Sotorasib Yes    1.7%   TP53 R249S None Yes          1.5%  PRIOR THERAPY: 1) SRS the the metastatic brain lesion under the care of Dr. Basilio Cairo 2) Craniotomy on 07/10/22 under the care of Dr. Jake Samples 3) radiation to the chest under care of Dr. Roselind Messier.  Last dose on 08/23/2022  CURRENT THERAPY: Systemic chemotherapy with carboplatin for an AUC of 5, to 500 mg/m2, and Keytruda 200 mg IV every 3 weeks.  First dose expected on October 08, 2022. Starting from cycle #5 she will be on maintenance treatment with Alimta and Keytruda every 3 weeks. Status post 11 cycles. Will reduce her dose of alimta to 400 mg/m2 secondary to intolerance   INTERVAL HISTORY: Laurie Mccoy 82 y.o. female returns to the clinic today for follow-up visit accompanied by * **patient is currently undergoing palliative chemotherapy and immunotherapy.  She was last seen by Dr. Arbutus Ped 3 weeks ago.  The patient has been struggling for some time with dyspnea on exertion.  Has had several CT scans without any acute findings.  She has nebulizers if needed.  She sees pulmonary medicine at [facility].  Dr. Arbutus Ped reportedly told the patient at her last appointment that she can periodically take prednisone, although the patient understands that this will make her immunotherapy less effective.  In the interval since last being seen Dr. Arbutus Ped: A prescription  for prednisone on 05/21/2023 per patient request.  She reports ***in her breathing.  She is on supplemental oxygen on an as-needed basis.  She uses oxygen for most of the day.  She also uses a CPAP at nighttime.  At her last appointment Dr. Arbutus Ped she reports some memory issues.  Her last appointment she was under the impression that her treatment was with curative intent even though we have had multiple appointments letting her know that she is stage IV and her condition is treatable but not curative.  Patient also wanted to skip treatment in September for she is going out of town with family***I will likely need to adjust this in her care plan.  ***She is currently seeing vein specialist for her right leg swelling. They recommended continued compression and referral for lymphedema pumps.  Patient has not heard anything about obtaining the lymphedema pumps at this time.  They also are considering her for vein ablation.  She reports that the lymphedema causes itching on her lower extremities.  She reports that the itching was interfering with her ability to sleep last night.  She has some topical cream.  ***  Regarding her treatment, overall, she tolerates it fair except for fatigue. Today, She denies any fever or night sweats.  Her appetite waxes and wanes.?  Denies any vomiting.  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  Denies any diarrhea or constipation.  Nuys any headaches.  He is here today for evaluation and repeat blood work before undergoing cycle #12.  MEDICAL HISTORY: Past Medical History:  Diagnosis Date   Anxiety    "had panic attacks years ago"   Asthma    COPD (chronic obstructive pulmonary disease) (HCC)    Goiter 2022   History of hiatal hernia    "dx in college, never bothered me"   History of kidney stones 2012   History of radiation therapy    Left Lung-07/10/22-08/23/22- Dr. Antony Blackbird   Hypertension    Lung cancer [facility])    Sleep apnea      ALLERGIES:  is allergic to compazine [prochlorperazine], norvasc [amlodipine], and trelegy ellipta [fluticasone-umeclidin-vilant].  MEDICATIONS:  Current Outpatient Medications  Medication Sig Dispense Refill   albuterol (VENTOLIN HFA) 108 (90 Base) MCG/ACT inhaler Inhale 2 puffs into the lungs every 6 (six) hours as needed for wheezing or shortness of breath. 18 g 1   ALPRAZolam (XANAX) 0.25 MG tablet Take 0.25 mg by mouth at bedtime as needed for anxiety.     Budeson-Glycopyrrol-Formoterol (BREZTRI AEROSPHERE) 160-9-4.8 MCG/ACT AERO INHALE 2 PUFFS BY MOUTH EVERY MORNING AND EVERY NIGHT AT BEDTIME (Patient taking differently: Inhale 2 puffs into the lungs in the morning and at bedtime.) 10.7 g 5   ELIQUIS 5 MG TABS tablet Take 1 tablet (5 mg total) by mouth 2 (two) times daily. 60 tablet 3   folic acid (FOLVITE) 1 MG tablet Take 1 tablet (1 mg total) by mouth daily. 30 tablet 2   furosemide (LASIX) 20 MG tablet 1 tablet p.o. daily as needed for increased leg swelling. (Patient not taking: Reported on 03/19/2023) 20 tablet 0   gabapentin (NEURONTIN) 100 MG capsule Take 1 capsule (100 mg total) by mouth 2 (two) times daily. 90 capsule 2   hydrOXYzine (ATARAX) 10 MG tablet Take 1 tablet (10 mg total) by mouth 3 (three) times daily as needed. 60 tablet 2   ipratropium-albuterol (DUONEB) 0.5-2.5 (3) MG/3ML SOLN Take 3 mLs by nebulization every 6 (six) hours as needed. 360 mL 0   levocetirizine (XYZAL) 5 MG tablet Take 5 mg by mouth at bedtime as needed (for seasonal allergies- if not taking generic Claritin).     lidocaine-prilocaine (EMLA) cream Apply 1 Application topically as needed. 30 g 2   LORazepam (ATIVAN) 1 MG tablet Take 1 tablet (1 mg total) by mouth as needed. 30 min Prior to MRI or brain radiation procedure 5 tablet 0   methylPREDNISolone (MEDROL DOSEPAK) 4 MG TBPK tablet Use as instructed. 21 tablet 0   ondansetron (ZOFRAN) 8 MG tablet Take 1 tablet (8 mg total) by mouth every 8  (eight) hours as needed for nausea or vomiting. (Patient not taking: Reported on 12/10/2022) 30 tablet 2   oxyCODONE (OXY IR/ROXICODONE) 5 MG immediate release tablet Take 1 tablet (5 mg total) by mouth every 4 (four) hours as needed for severe pain. 12 tablet 0   pantoprazole (PROTONIX) 20 MG tablet Take 20 mg by mouth every evening.     telmisartan (MICARDIS) 20 MG tablet Take 1 tablet (20 mg total) by mouth daily. NEED OV. 15 tablet 0   traMADol (ULTRAM) 50 MG tablet Take 2 tablets (100 mg total) by mouth every 6 (six) hours as needed for severe pain. (Patient taking differently: Take 50 mg by mouth every 6 (six) hours as needed for severe pain.) 30 tablet 0   Zinc Acetate, Oral, (ZINC ACETATE PO) Take 1 tablet by mouth daily. Received  from herbalist     No current facility-administered medications for this visit.    SURGICAL HISTORY:  Past Surgical History:  Procedure Laterality Date   APPENDECTOMY     APPLICATION OF CRANIAL NAVIGATION N/A 07/11/2022   Procedure: APPLICATION OF CRANIAL NAVIGATION;  Surgeon: Dawley, Alan Mulder, DO;  Location: [facility] OR;  Service: Neurosurgery;  Laterality: N/A;   BRONCHIAL BIOPSY  06/12/2021   Procedure: BRONCHIAL BIOPSIES;  Surgeon: Josephine Igo, DO;  Location: [facility] ENDOSCOPY;  Service: Pulmonary;;   BRONCHIAL BRUSHINGS  06/12/2021   Procedure: BRONCHIAL BRUSHINGS;  Surgeon: Josephine Igo, DO;  Location: [facility] END[facility];  Service: Pulmonary;;   BRONCHIAL WASHINGS  06/12/2021   Procedure: BRONCHIAL WASHINGS;  Surgeon: Josephine Igo, DO;  Location: [facility] ENDOS[facility];  Service: Pulmonary;;   CHOLECYSTECTOMY     COLON RESECTION     CRANIOTOMY N/A 07/11/2022   Procedure: Craniotomy for resection of tumor;  Surgeon: Bethann Goo, DO;  Location: [facility] OR;  Service: Neurosurgery;  Laterality: N/A;  RM 19   FIDUCIAL MARKER PLACEMENT  06/12/2021   Procedure: FIDUCIAL MARKER PLACEMENT;  Surgeon: Josephine Igo, DO;  Location: [facility];  Service: Pulmonary;;   hernia      x 2   IR IMAGING GUIDED PORT INSERTION  10/11/2022   TONSILLECTOMY     VIDEO BRONCHOSCOPY WITH ENDOBRONCHIAL NAVIGATION Bilateral 06/12/2021   Procedure: VIDEO BRONCHOSCOPY WITH ENDOBRONCHIAL NAVIGATION;  Surgeon: Josephine Igo, DO;  Location: [facility];  Service: Pulmonary;  Laterality: Bilateral;  ION   VIDEO BRONCHOSCOPY WITH RADIAL ENDOBRONCHIAL ULTRASOUND  06/12/2021   Procedure: RADIAL ENDOBRONCHIAL ULTRASOUND;  Surgeon: Josephine Igo, DO;  Location: [facility];  Service: Pulmonary;;    REVIEW OF SYSTEMS:   Review of Systems  Constitutional: Negative for appetite change, chills, fatigue, fever and unexpected weight change.  HENT:   Negative for mouth sores, nosebleeds, sore throat and trouble swallowing.   Eyes: Negative for eye problems and icterus.  Respiratory: Negative for cough, hemoptysis, shortness of breath and wheezing.   Cardiovascular: Negative for chest pain and leg swelling.  Gastrointestinal: Negative for abdominal pain, constipation, diarrhea, nausea and vomiting.  Genitourinary: Negative for bladder incontinence, difficulty urinating, dysuria, frequency and hematuria.   Musculoskeletal: Negative for back pain, gait problem, neck pain and neck stiffness.  Skin: Negative for itching and rash.  Neurological: Negative for dizziness, extremity weakness, gait problem, headaches, light-headedness and seizures.  Hematological: Negative for adenopathy. Does not bruise/bleed easily.  Psychiatric/Behavioral: Negative for confusion, depression and sleep disturbance. The patient is not nervous/anxious.     PHYSICAL EXAMINATION:  There were no vitals taken for this visit.  ECOG PERFORMANCE STATUS: {CHL ONC ECOG [id number]  Physical Exam  Constitutional: Oriented to person, place, and time and well-developed, well-nourished, and in no distress. No distress.  HENT:  Head: Normocephalic and atraumatic.  Mouth/Throat: Oropharynx is clear and moist. No  oropharyngeal exudate.  Eyes: Conjunctivae are normal. Right eye exhibits no discharge. Left eye exhibits no discharge. No scleral icterus.  Neck: Normal range of motion. Neck supple.  Cardiovascular: Normal rate, regular rhythm, normal heart sounds and intact distal pulses.   Pulmonary/Chest: Effort normal and breath sounds normal. No respiratory distress. No wheezes. No rales.  Abdominal: Soft. Bowel sounds are normal. Exhibits no distension and no mass. There is no tenderness.  Musculoskeletal: Normal range of motion. Exhibits no edema.  Lymphadenopathy:    No cervical adenopathy.  Neurological: Alert and oriented to person, place, and time. Exhibits  normal muscle tone. Gait normal. Coordination normal.  Skin: Skin is warm and dry. No rash noted. Not diaphoretic. No erythema. No pallor.  Psychiatric: Mood, memory and judgment normal.  Vitals reviewed.  LABORATORY DATA: Lab Results  Component Value Date   WBC 3.5 (L) 05/06/2023   HGB 12.1 05/06/2023   HCT 37.0 05/06/2023   MCV 101.9 (H) 05/06/2023   PLT 205 05/06/2023      Chemistry      Component Value Date/Time   NA 138 05/06/2023 1311   NA 139 09/17/2019 1550   K 4.0 05/06/2023 1311   CL 103 05/06/2023 1311   CO2 28 05/06/2023 1311   BUN 9 05/06/2023 1311   BUN 9 09/17/2019 1550   CREATININE 0.63 05/06/2023 1311      Component Value Date/Time   CALCIUM 9.3 05/06/2023 1311   ALKPHOS 74 05/06/2023 1311   AST 17 05/06/2023 1311   ALT 16 05/06/2023 1311   BILITOT 0.4 05/06/2023 1311       RADIOGRAPHIC STUDIES:  MR Brain W Wo Contrast  Result Date: 05/01/2023 CLINICAL DATA:  82 year old female with non-small cell lung cancer. Brain metastases treated with resection and SRS. Restaging. EXAM: MRI HEAD WITHOUT AND WITH CONTRAST TECHNIQUE: Multiplanar, multiecho pulse sequences of the brain and surrounding structures were obtained without and with intravenous contrast. CONTRAST:  9 mL Vueway COMPARISON:  01/23/2023 brain  MRI and earlier. FINDINGS: Study is intermittently degraded by motion artifact despite repeated imaging attempts. Brain: Subcentimeter left lateral periventricular enhancing lesion on series 14, image 103 remains stable. Chronic hemosiderin there on SWI. Mild FLAIR hyperintensity there appears stable without mass effect. No other abnormal intracranial enhancement is identified today. Right occipital pole resection cavity appears stable. Unchanged T2/FLAIR hyperintensity there with no regional mass effect. Wallace Cullens and white matter signal elsewhere appears stable. No restricted diffusion to suggest acute infarction. No midline shift, ventriculomegaly, extra-axial collection or acute intracranial hemorrhage. Cervicomedullary junction and pituitary are within normal limits. No dural thickening identified. Vascular: Major intracranial vascular flow voids are stable. Skull and upper cervical spine: Background bone marrow signal is within normal limits. Negative visible cervical spine. Stable post craniotomy changes. Sinuses/Orbits: Stable, negative. Other: Trace mastoid fluid has not significantly changed. Grossly normal visible internal auditory structures. Stable scalp and face. IMPRESSION: Motion degraded exam today. Solitary enhancing left lateral periventricular lesion is stable. Stable right occipital pole resection cavity. No new or progressed metastatic disease identified. Electronically Signed   By: Odessa Fleming M.D.   On: 05/01/2023 08:49     ASSESSMENT/PLAN:  This is a very pleasant 82 year old Caucasian female with likely stage IV (T2a, N3, M1B) non-small cell lung cancer, adenocarcinoma presented with left upper lobe perihilar mass in addition to left hilar and mediastinal lymphadenopathy as well as left supraclavicular lymphadenopathy and a solitary brain metastasis diagnosed in September 2023. The molecular studies showed positive KRAS G12C mutation. Discussed this can be used in the second line setting in the  future.    The patient underwent SRS and craniotomy under the care of Dr. Basilio Cairo and Dr. Jake Samples on 07/10/22. Dr. Jake Samples does not recommend any chemotherapy for at least 1 month from her surgery due to wound healing.   She is recently completed radiation to the chest under the care of Dr. Roselind Messier. The last day of radiation was scheduled for 08/23/22   The plan is to undergo systemic chemotherapy wit carboplatin for an AUC of 5, Alimta 500 mg/m, Keytruda 200 mg  IV every 3 weeks. She is status post 11 cycles.  Starting from cycle #5, she started maintenance Alimta and Keytruda. Her dose of Alimta is reduced to 400 mg/m2.   The patient was seen with Dr. Arbutus Ped today.  Labs were reviewed.  Recommend that she ***with cycle #12 today scheduled.  The patient is going out of town in September and will be gone from****.  Would like to resume her treatment on  ***  Order scan?  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  She we will continue to follow with the vein specialist for her leg edema.  Follow-up  Prednisone?  The patient was advised to call immediately if she has any concerning symptoms in the interval. The patient voices understanding of current disease status and treatment options and is in agreement with the current care plan. All questions were answered. The patient knows to call the clinic with any problems, questions or concerns. We can certainly see the patient much sooner if necessary     No orders of the defined types were placed in this encounter.    I spent {CHL ONC TIME VISIT - XBJYN:[id number]} counseling the patient face to face. The total time spent in the appointment was {CHL ONC TIME VISIT - MVHQI:[id number]}.  Brent Noto L Lavanna Rog, PA-C 05/23/23

## 2023-05-27 ENCOUNTER — Other Ambulatory Visit: Payer: Self-pay | Admitting: Physician Assistant

## 2023-05-27 ENCOUNTER — Inpatient Hospital Stay: Payer: Medicare Other

## 2023-05-27 ENCOUNTER — Inpatient Hospital Stay: Payer: Medicare Other | Attending: Internal Medicine

## 2023-05-27 ENCOUNTER — Inpatient Hospital Stay (HOSPITAL_BASED_OUTPATIENT_CLINIC_OR_DEPARTMENT_OTHER): Payer: Medicare Other | Admitting: Physician Assistant

## 2023-05-27 VITALS — BP 131/78 | HR 88 | Temp 98.6°F | Resp 16 | Ht 66.0 in | Wt 196.0 lb

## 2023-05-27 DIAGNOSIS — Z9049 Acquired absence of other specified parts of digestive tract: Secondary | ICD-10-CM | POA: Diagnosis not present

## 2023-05-27 DIAGNOSIS — Z9089 Acquired absence of other organs: Secondary | ICD-10-CM | POA: Insufficient documentation

## 2023-05-27 DIAGNOSIS — Z923 Personal history of irradiation: Secondary | ICD-10-CM | POA: Diagnosis not present

## 2023-05-27 DIAGNOSIS — Z5111 Encounter for antineoplastic chemotherapy: Secondary | ICD-10-CM

## 2023-05-27 DIAGNOSIS — R5383 Other fatigue: Secondary | ICD-10-CM | POA: Insufficient documentation

## 2023-05-27 DIAGNOSIS — R059 Cough, unspecified: Secondary | ICD-10-CM | POA: Diagnosis not present

## 2023-05-27 DIAGNOSIS — Z95828 Presence of other vascular implants and grafts: Secondary | ICD-10-CM

## 2023-05-27 DIAGNOSIS — Z79631 Long term (current) use of antimetabolite agent: Secondary | ICD-10-CM | POA: Insufficient documentation

## 2023-05-27 DIAGNOSIS — H538 Other visual disturbances: Secondary | ICD-10-CM | POA: Insufficient documentation

## 2023-05-27 DIAGNOSIS — Z888 Allergy status to other drugs, medicaments and biological substances status: Secondary | ICD-10-CM | POA: Diagnosis not present

## 2023-05-27 DIAGNOSIS — C7931 Secondary malignant neoplasm of brain: Secondary | ICD-10-CM | POA: Diagnosis not present

## 2023-05-27 DIAGNOSIS — R600 Localized edema: Secondary | ICD-10-CM | POA: Diagnosis not present

## 2023-05-27 DIAGNOSIS — C3412 Malignant neoplasm of upper lobe, left bronchus or lung: Secondary | ICD-10-CM | POA: Diagnosis present

## 2023-05-27 DIAGNOSIS — R110 Nausea: Secondary | ICD-10-CM | POA: Diagnosis not present

## 2023-05-27 DIAGNOSIS — M7989 Other specified soft tissue disorders: Secondary | ICD-10-CM | POA: Diagnosis not present

## 2023-05-27 DIAGNOSIS — C3490 Malignant neoplasm of unspecified part of unspecified bronchus or lung: Secondary | ICD-10-CM | POA: Diagnosis not present

## 2023-05-27 DIAGNOSIS — Z5112 Encounter for antineoplastic immunotherapy: Secondary | ICD-10-CM

## 2023-05-27 DIAGNOSIS — Z87442 Personal history of urinary calculi: Secondary | ICD-10-CM | POA: Diagnosis not present

## 2023-05-27 DIAGNOSIS — R634 Abnormal weight loss: Secondary | ICD-10-CM | POA: Insufficient documentation

## 2023-05-27 DIAGNOSIS — R0609 Other forms of dyspnea: Secondary | ICD-10-CM | POA: Insufficient documentation

## 2023-05-27 DIAGNOSIS — Z79620 Long term (current) use of immunosuppressive biologic: Secondary | ICD-10-CM | POA: Diagnosis not present

## 2023-05-27 DIAGNOSIS — K5900 Constipation, unspecified: Secondary | ICD-10-CM | POA: Insufficient documentation

## 2023-05-27 DIAGNOSIS — Z7901 Long term (current) use of anticoagulants: Secondary | ICD-10-CM | POA: Insufficient documentation

## 2023-05-27 DIAGNOSIS — Z79899 Other long term (current) drug therapy: Secondary | ICD-10-CM | POA: Diagnosis not present

## 2023-05-27 LAB — CMP (CANCER CENTER ONLY)
ALT: 15 U/L (ref 0–44)
AST: 18 U/L (ref 15–41)
Albumin: 3.9 g/dL (ref 3.5–5.0)
Alkaline Phosphatase: 72 U/L (ref 38–126)
Anion gap: 7 (ref 5–15)
BUN: 12 mg/dL (ref 8–23)
CO2: 27 mmol/L (ref 22–32)
Calcium: 9.1 mg/dL (ref 8.9–10.3)
Chloride: 103 mmol/L (ref 98–111)
Creatinine: 0.65 mg/dL (ref 0.44–1.00)
GFR, Estimated: >60 mL/min (ref >60)
Glucose, Bld: 123 mg/dL — ABNORMAL HIGH (ref 70–99)
Potassium: 4.1 mmol/L (ref 3.5–5.1)
Sodium: 137 mmol/L (ref 135–145)
Total Bilirubin: 0.5 mg/dL (ref 0.3–1.2)
Total Protein: 6.7 g/dL (ref 6.5–8.1)

## 2023-05-27 LAB — CBC WITH DIFFERENTIAL (CANCER CENTER ONLY)
Abs Immature Granulocytes: 0.01 10*3/uL (ref 0.00–0.07)
Basophils Absolute: 0.1 10*3/uL (ref 0.0–0.1)
Basophils Relative: 1 %
Eosinophils Absolute: 0.1 10*3/uL (ref 0.0–0.5)
Eosinophils Relative: 2 %
HCT: 36.5 % (ref 36.0–46.0)
Hemoglobin: 11.8 g/dL — ABNORMAL LOW (ref 12.0–15.0)
Immature Granulocytes: 0 %
Lymphocytes Relative: 15 %
Lymphs Abs: 0.6 10*3/uL — ABNORMAL LOW (ref 0.7–4.0)
MCH: 33.2 pg (ref 26.0–34.0)
MCHC: 32.3 g/dL (ref 30.0–36.0)
MCV: 102.8 fL — ABNORMAL HIGH (ref 80.0–100.0)
Monocytes Absolute: 0.5 10*3/uL (ref 0.1–1.0)
Monocytes Relative: 12 %
Neutro Abs: 2.9 10*3/uL (ref 1.7–7.7)
Neutrophils Relative %: 70 %
Platelet Count: 224 10*3/uL (ref 150–400)
RBC: 3.55 MIL/uL — ABNORMAL LOW (ref 3.87–5.11)
RDW: 14.2 % (ref 11.5–15.5)
WBC Count: 4.2 10*3/uL (ref 4.0–10.5)
nRBC: 0 % (ref 0.0–0.2)

## 2023-05-27 LAB — TSH: TSH: 8.983 u[IU]/mL — ABNORMAL HIGH (ref 0.350–4.500)

## 2023-05-27 MED ORDER — SODIUM CHLORIDE 0.9% FLUSH
10.0000 mL | Freq: Once | INTRAVENOUS | Status: AC
  Administered 2023-05-27: 10 mL

## 2023-05-27 MED ORDER — HEPARIN SOD (PORK) LOCK FLUSH 100 UNIT/ML IV SOLN
500.0000 [IU] | Freq: Once | INTRAVENOUS | Status: AC | PRN
  Administered 2023-05-27: 500 [IU]

## 2023-05-27 MED ORDER — SODIUM CHLORIDE 0.9 % IV SOLN
400.0000 mg/m2 | Freq: Once | INTRAVENOUS | Status: AC
  Administered 2023-05-27: 800 mg via INTRAVENOUS
  Filled 2023-05-27: qty 20

## 2023-05-27 MED ORDER — ONDANSETRON HCL 4 MG/2ML IJ SOLN
8.0000 mg | Freq: Once | INTRAMUSCULAR | Status: AC
  Administered 2023-05-27: 8 mg via INTRAVENOUS
  Filled 2023-05-27: qty 4

## 2023-05-27 MED ORDER — FOLIC ACID 1 MG PO TABS
1.0000 mg | ORAL_TABLET | Freq: Every day | ORAL | 2 refills | Status: DC
Start: 2023-05-27 — End: 2023-08-12

## 2023-05-27 MED ORDER — SODIUM CHLORIDE 0.9 % IV SOLN
200.0000 mg | Freq: Once | INTRAVENOUS | Status: AC
  Administered 2023-05-27: 200 mg via INTRAVENOUS
  Filled 2023-05-27: qty 200

## 2023-05-27 MED ORDER — SODIUM CHLORIDE 0.9% FLUSH
10.0000 mL | INTRAVENOUS | Status: DC | PRN
  Administered 2023-05-27: 10 mL

## 2023-05-27 MED ORDER — SODIUM CHLORIDE 0.9 % IV SOLN: Freq: Once | INTRAVENOUS | Status: AC

## 2023-05-27 MED ORDER — CYANOCOBALAMIN 1000 MCG/ML IJ SOLN
1000.0000 ug | Freq: Once | INTRAMUSCULAR | Status: AC
  Administered 2023-05-27: 1000 ug via INTRAMUSCULAR
  Filled 2023-05-27: qty 1

## 2023-05-27 NOTE — Patient Instructions (Signed)
[facility]  Discharge Instructions: Thank you for choosing [facility] to provide your oncology and hematology care.   If you have a lab appointment with the [facility], please go directly to the [facility] and check in at the registration area.   Wear comfortable clothing and clothing appropriate for easy access to any Portacath or PICC line.   We strive to give you quality time with your provider. You may need to reschedule your appointment if you arrive late (15 or more minutes).  Arriving late affects you and other patients whose appointments are after yours.  Also, if you miss three or more appointments without notifying the office, you may be dismissed from the clinic at the provider's discretion.      For prescription refill requests, have your pharmacy contact our office and allow 72 hours for refills to be completed.    Today you received the following chemotherapy and/or immunotherapy agents: Keytruda, Alimta.       To help prevent nausea and vomiting after your treatment, we encourage you to take your nausea medication as directed.  BELOW ARE SYMPTOMS THAT SHOULD BE REPORTED IMMEDIATELY: *FEVER GREATER THAN 100.4 F (38 C) OR HIGHER *CHILLS OR SWEATING *NAUSEA AND VOMITING THAT IS NOT CONTROLLED WITH YOUR NAUSEA MEDICATION *UNUSUAL SHORTNESS OF BREATH *UNUSUAL BRUISING OR BLEEDING *URINARY PROBLEMS (pain or burning when urinating, or frequent urination) *BOWEL PROBLEMS (unusual diarrhea, constipation, pain near the anus) TENDERNESS IN MOUTH AND THROAT WITH OR WITHOUT PRESENCE OF ULCERS (sore throat, sores in mouth, or a toothache) UNUSUAL RASH, SWELLING OR PAIN  UNUSUAL VAGINAL DISCHARGE OR ITCHING   Items with * indicate a potential emergency and should be followed up as soon as possible or go to the Emergency Department if any problems should occur.  Please show the CHEMOTHERAPY ALERT CARD or IMMUNOTHERAPY ALERT CARD  at check-in to the Emergency Department and triage nurse.  Should you have questions after your visit or need to cancel or reschedule your appointment, please contact [facility]  Dept: [phone]  and follow the prompts.  Office hours are 8:00 a.m. to 4:30 p.m. Monday - Friday. Please note that voicemails left after 4:00 p.m. may not be returned until the following business day.  We are closed weekends and major holidays. You have access to a nurse at all times for urgent questions. Please call the main number to the clinic Dept: [phone] and follow the prompts.   For any non-urgent questions, you may also contact your provider using MyChart. We now offer e-Visits for anyone 34 and older to request care online for non-urgent symptoms. For details visit [email or web address].   Also download the MyChart app! Go to the app store, search "MyChart", open the app, select [facility], and log in with your MyChart username and password.

## 2023-05-28 LAB — T4: T4, Total: 9.1 ug/dL (ref 4.5–12.0)

## 2023-06-16 ENCOUNTER — Encounter: Payer: Self-pay | Admitting: Surgery

## 2023-06-16 ENCOUNTER — Ambulatory Visit (INDEPENDENT_AMBULATORY_CARE_PROVIDER_SITE_OTHER): Payer: Medicare Other | Admitting: Surgery

## 2023-06-16 VITALS — BP 130/80 | HR 98 | Temp 98.5°F | Resp 20 | Ht 66.0 in | Wt 195.0 lb

## 2023-06-16 DIAGNOSIS — I872 Venous insufficiency (chronic) (peripheral): Secondary | ICD-10-CM | POA: Diagnosis not present

## 2023-06-16 NOTE — Progress Notes (Signed)
[facility] [place]  Patient name: Laurie Mccoy MRN: [id number] DOB: [birth year] Sex: female   REASON FOR VISIT:    Follow up  HISOTRY OF PRESENT ILLNESS:    Laurie Mccoy is a 82 y.o. female who was initially seen in the [facility] clinic for leg swelling.  This began approximately 2 years ago.  He does have a history of a right popliteal DVT.  She continues to be on Eliquis for this.  She does try to keep her legs elevated.  She will wear the zipper compression socks  The patient suffers from stage IV non-small cell lung cancer, diagnosed in September 2023.  She has undergone SRS followed by craniotomy to address her solitary brain metastasis.  She has also undergone palliative radiotherapy to her left lung lesion as well as systemic chemotherapy. PAST MEDICAL HISTORY:   Past Medical History:  Diagnosis Date   Anxiety    "had panic attacks years ago"   Asthma    COPD (chronic obstructive pulmonary disease) (HCC)    Goiter 2022   History of hiatal hernia    "dx in college, never bothered me"   History of kidney stones 2012   History of radiation therapy    Left Lung-07/10/22-08/23/22- Dr. Antony Blackbird   Hypertension    Lung cancer [facility])    Sleep apnea      FAMILY HISTORY:   Family History  Problem Relation Age of Onset   Lung cancer Mother        never smoker   Brain cancer Mother    Lung cancer Father        smoked   Stroke Brother     SOCIAL HISTORY:   Social History   Tobacco Use   Smoking status: Former    Current packs/day: 0.00    Average packs/day: 0.5 packs/day for 46.0 years (23.0 ttl pk-yrs)    Types: Cigarettes    Start date: 06/10/1976    Quit date: 06/10/2022    Years since quitting: 1.0    Passive exposure: Past   Smokeless tobacco: Never  Substance Use Topics   Alcohol use: Not Currently     ALLERGIES:   Allergies  Allergen Reactions   Compazine [Prochlorperazine]     Lose  muscle control in face    Norvasc [Amlodipine] Swelling    Lower extremity swelling     Trelegy Ellipta [Fluticasone-Umeclidin-Vilant] Swelling    Swelling in legs and headache.     CURRENT MEDICATIONS:   Current Outpatient Medications  Medication Sig Dispense Refill   albuterol (VENTOLIN HFA) 108 (90 Base) MCG/ACT inhaler Inhale 2 puffs into the lungs every 6 (six) hours as needed for wheezing or shortness of breath. 18 g 1   ALPRAZolam (XANAX) 0.25 MG tablet Take 0.25 mg by mouth at bedtime as needed for anxiety.     Budeson-Glycopyrrol-Formoterol (BREZTRI AEROSPHERE) 160-9-4.8 MCG/ACT AERO INHALE 2 PUFFS BY MOUTH EVERY MORNING AND EVERY NIGHT AT BEDTIME (Patient taking differently: Inhale 2 puffs into the lungs in the morning and at bedtime.) 10.7 g 5   ELIQUIS 5 MG TABS tablet Take 1 tablet (5 mg total) by mouth 2 (two) times daily. 60 tablet 3   folic acid (FOLVITE) 1 MG tablet Take 1 tablet (1 mg total) by mouth daily. 30 tablet 2   furosemide (LASIX) 20 MG tablet 1 tablet p.o. daily as needed for increased leg swelling. (Patient not taking: Reported on 03/19/2023) 20 tablet 0   gabapentin (NEURONTIN)  100 MG capsule Take 1 capsule (100 mg total) by mouth 2 (two) times daily. 90 capsule 2   hydrOXYzine (ATARAX) 10 MG tablet Take 1 tablet (10 mg total) by mouth 3 (three) times daily as needed. 60 tablet 2   ipratropium-albuterol (DUONEB) 0.5-2.5 (3) MG/3ML SOLN Take 3 mLs by nebulization every 6 (six) hours as needed. 360 mL 0   levocetirizine (XYZAL) 5 MG tablet Take 5 mg by mouth at bedtime as needed (for seasonal allergies- if not taking generic Claritin).     lidocaine-prilocaine (EMLA) cream Apply 1 Application topically as needed. 30 g 2   LORazepam (ATIVAN) 1 MG tablet Take 1 tablet (1 mg total) by mouth as needed. 30 min Prior to MRI or brain radiation procedure 5 tablet 0   methylPREDNISolone (MEDROL DOSEPAK) 4 MG TBPK tablet Use as instructed. 21 tablet 0   ondansetron (ZOFRAN)  8 MG tablet Take 1 tablet (8 mg total) by mouth every 8 (eight) hours as needed for nausea or vomiting. (Patient not taking: Reported on 12/10/2022) 30 tablet 2   oxyCODONE (OXY IR/ROXICODONE) 5 MG immediate release tablet Take 1 tablet (5 mg total) by mouth every 4 (four) hours as needed for severe pain. 12 tablet 0   pantoprazole (PROTONIX) 20 MG tablet Take 20 mg by mouth every evening.     telmisartan (MICARDIS) 20 MG tablet Take 1 tablet (20 mg total) by mouth daily. NEED OV. 15 tablet 0   traMADol (ULTRAM) 50 MG tablet Take 2 tablets (100 mg total) by mouth every 6 (six) hours as needed for severe pain. (Patient taking differently: Take 50 mg by mouth every 6 (six) hours as needed for severe pain.) 30 tablet 0   Zinc Acetate, Oral, (ZINC ACETATE PO) Take 1 tablet by mouth daily. Received from herbalist     No current facility-administered medications for this visit.    REVIEW OF SYSTEMS:   [X]  denotes positive finding, [ ]  denotes negative finding Cardiac  Comments:  Chest pain or chest pressure:    Shortness of breath upon exertion:    Short of breath when lying flat:    Irregular heart rhythm:        Vascular    Pain in calf, thigh, or hip brought on by ambulation:    Pain in feet at night that wakes you up from your sleep:     Blood clot in your veins:    Leg swelling:  x       Pulmonary    Oxygen at home:    Productive cough:     Wheezing:         Neurologic    Sudden weakness in arms or legs:     Sudden numbness in arms or legs:     Sudden onset of difficulty speaking or slurred speech:    Temporary loss of vision in one eye:     Problems with dizziness:         Gastrointestinal    Blood in stool:     Vomited blood:         Genitourinary    Burning when urinating:     Blood in urine:        Psychiatric    Major depression:         Hematologic    Bleeding problems:    Problems with blood clotting too easily:        Skin    Rashes or ulcers:  Constitutional    Fever or chills:      PHYSICAL EXAM:   There were no vitals filed for this visit.  GENERAL: The patient is a well-nourished female, in no acute distress. The vital signs are documented above. CARDIAC: There is a regular rate and rhythm.  VASCULAR: Palpable pedal pulses.  Bilateral lower extremity edema with hyperpigmentation PULMONARY: Non-labored respirations MUSCULOSKELETAL: There are no major deformities or cyanosis. NEUROLOGIC: No focal weakness or paresthesias are detected. SKIN: There are no ulcers or rashes noted. PSYCHIATRIC: The patient has a normal affect.  STUDIES:   I have reviewed the following venous reflux study: Venous Reflux Times  +--------------+---------+------+-----------+------------+----------------+   RIGHT        Reflux NoRefluxReflux TimeDiameter cmsComments                                  Yes                                            +--------------+---------+------+-----------+------------+----------------+   CFV                    yes   >1 second                                +--------------+---------+------+-----------+------------+----------------+   FV prox                 yes                                            +--------------+---------+------+-----------+------------+----------------+   FV mid        no                                                       +--------------+---------+------+-----------+------------+----------------+   FV dist       no                                                       +--------------+---------+------+-----------+------------+----------------+   Popliteal    no                                    chronic  thrombus  +--------------+---------+------+-----------+------------+----------------+   GSV at SFJ              yes    >500 ms      0.40                        +--------------+---------+------+-----------+------------+----------------+   GSV prox thigh          yes    >500 ms      0.66                       +--------------+---------+------+-----------+------------+----------------+  GSV mid thigh no                            0.33                       +--------------+---------+------+-----------+------------+----------------+   GSV dist thighno                            0.39                       +--------------+---------+------+-----------+------------+----------------+   GSV at knee   no                            0.35    branching          +--------------+---------+------+-----------+------------+----------------+   GSV prox calf           yes    >500 ms      0.33                       +--------------+---------+------+-----------+------------+----------------+   SSV Pop Fossa no                            0.32                       +--------------+---------+------+-----------+------------+----------------+   SSV prox calf no                            0.56                       +--------------+---------+------+-----------+------------+----------------+   PFV                    yes  > 1 second                                +--------------+---------+------+-----------+------------+----------------+        MEDICAL ISSUES:   Bilateral lower extremity edema: The patient does have reflux at the saphenofemoral junction and proximal saphenous vein on the right, however she also has femoral and common femoral reflux with the popliteal DVT.  Based off her ultrasound, I am not sure how much benefit she will receive from laser ablation.  In addition she is able to wear her compression socks routinely.  Therefore with her medical issues and age, I would not recommend laser ablation.  I am getting her remeasured to make sure that she is wearing the appropriate size for her compression  socks.  I think she should wear compression socks whenever possible, keep her legs elevated when possible.  She and her husband are both in agreement of this plan    Charlena Cross, MD, FACS [facility] [phone] Pager [phone]

## 2023-06-23 ENCOUNTER — Ambulatory Visit (HOSPITAL_COMMUNITY)
Admission: RE | Admit: 2023-06-23 | Discharge: 2023-06-23 | Disposition: A | Discharge status: Home / Self Care | Payer: Medicare Other | Source: Ambulatory Visit | Attending: Physician Assistant | Admitting: Physician Assistant

## 2023-06-23 DIAGNOSIS — C3490 Malignant neoplasm of unspecified part of unspecified bronchus or lung: Secondary | ICD-10-CM | POA: Diagnosis present

## 2023-06-23 MED ORDER — IOHEXOL 300 MG/ML  SOLN
100.0000 mL | Freq: Once | INTRAMUSCULAR | Status: AC | PRN
  Administered 2023-06-23: 100 mL via INTRAVENOUS

## 2023-06-23 MED ORDER — HEPARIN SOD (PORK) LOCK FLUSH 100 UNIT/ML IV SOLN
INTRAVENOUS | Status: AC
  Filled 2023-06-23: qty 5

## 2023-06-23 MED ORDER — HEPARIN SOD (PORK) LOCK FLUSH 100 UNIT/ML IV SOLN
500.0000 [IU] | Freq: Once | INTRAVENOUS | Status: AC
  Administered 2023-06-23: 500 [IU] via INTRAVENOUS

## 2023-06-24 ENCOUNTER — Other Ambulatory Visit: Payer: Self-pay

## 2023-06-24 ENCOUNTER — Other Ambulatory Visit (HOSPITAL_COMMUNITY): Payer: Medicare Other

## 2023-06-26 ENCOUNTER — Telehealth: Payer: Self-pay | Admitting: Radiation Therapy

## 2023-06-26 NOTE — Telephone Encounter (Signed)
I spoke with pt about her upcoming brain MRI and follow-up with Dr. Jake Samples. She did not have a pen at the time, so I also called her back and left a voicemail on her phone with the appt information for her to write down later.   Laurie Mccoy R.T.(R)(T) Radiation Special Procedures Navigator

## 2023-06-29 ENCOUNTER — Other Ambulatory Visit: Payer: Self-pay | Admitting: Hematology and Oncology

## 2023-06-29 MED ORDER — ALBUTEROL SULFATE HFA 108 (90 BASE) MCG/ACT IN AERS: 2.0000 | INHALATION_SPRAY | Freq: Four times a day (QID) | RESPIRATORY_TRACT | 0 refills | Status: AC | PRN

## 2023-06-29 NOTE — Progress Notes (Signed)
[facility] OFFICE PROGRESS NOTE  [name], NP [phone] [place] [place] [address]  DIAGNOSIS: Stage IV (T2 a, N3, M1b) non-small cell lung cancer, adenocarcinoma presented with left upper lobe perihilar mass in addition to left hilar and mediastinal lymphadenopathy as well as left supraclavicular lymphadenopathy with brain metastasis diagnosed in September 2023.   [name]   Detected Alteration(s) / Biomarker(s)          Associated FDA-approved therapies  Clinical Trial Availability          % cfDNA or Amplification   KRAS G12C approved by FDA Adagrasib, Sotorasib Yes    1.7%   TP53 [id number] None Yes          1.5%  PRIOR THERAPY: 1) SRS the the metastatic brain lesion under the care of Dr. Basilio Cairo 2) Craniotomy on 07/10/22 under the care of Dr. Jake Samples 3) radiation to the chest under care of Dr. Roselind Messier.  Last dose on 08/23/2022  CURRENT THERAPY: Systemic chemotherapy with carboplatin for an AUC of 5, to 500 mg/m2, and Keytruda 200 mg IV every 3 weeks.  First dose expected on October 08, 2022. Starting from cycle #5 she will be on maintenance treatment with Alimta and Keytruda every 3 weeks. Status post 12 cycles. Will reduce her dose of alimta to 400 mg/m2 secondary to intolerance   INTERVAL HISTORY: [place] 82 y.o. female returns  to the clinic today for a follow-up visit accompanied by her husband. The patient is currently undergoing palliative chemotherapy and immunotherapy.  She was last seen by myself a few weeks ago.   She went on a family trip recently and had a good time.  However, the patient is not feeling well today and she does not feel up for treatment today.  Her scan mention some constipation.  She states her last bowel movement was 2 days ago.  She had 3 bowel movements that day.  She knows she should be on a bowel regimen if she experiences frequent constipation but has not taken any stool softeners last 2 days.  She states that, today, she started  having generalized abdominal pain and nausea.  She never experiences vomiting and has not had any vomiting.  Denies any blood in the stool.  Denies any urinary changes.  Denies any fever, chills, night sweats, or unexplained weight loss.  She reports that her breathing is "the same".  She has dyspnea on exertion at baseline and she sees pulmonary medicine at [facility].  She was given some prednisone for her family vacation recently which helped her breathing.  Nuys any chest pain or hemoptysis.  At baseline she may experience only a mild cough intermittently if sitting for too long.  She denies any rashes or skin changes.  She recently had a restaging CT scan.  She is here today for evaluation to review her scan results before considering cycle #20.   She is here today for evaluation and to review her scan before undergoing cycle #13.    MEDICAL HISTORY: Past Medical History:  Diagnosis Date   Anxiety    "had panic attacks years ago"   Asthma    COPD (chronic obstructive pulmonary disease) (HCC)    Goiter 2022   History of hiatal hernia    "dx in college, never bothered me"   History of kidney stones 2012   History of radiation therapy    Left Lung-07/10/22-08/23/22- Dr. Antony Blackbird   Hypertension  Lung cancer (HCC)    Sleep apnea     ALLERGIES:  is allergic to compazine [prochlorperazine], norvasc [amlodipine], and trelegy ellipta [fluticasone-umeclidin-vilant].  MEDICATIONS:  Current Outpatient Medications  Medication Sig Dispense Refill   albuterol (VENTOLIN HFA) 108 (90 Base) MCG/ACT inhaler Inhale 2 puffs into the lungs every 6 (six) hours as needed for wheezing or shortness of breath. 18 g 0   ALPRAZolam (XANAX) 0.25 MG tablet Take 0.25 mg by mouth at bedtime as needed for anxiety.     Budeson-Glycopyrrol-Formoterol (BREZTRI AEROSPHERE) 160-9-4.8 MCG/ACT AERO INHALE 2 PUFFS BY MOUTH EVERY MORNING AND EVERY NIGHT AT BEDTIME (Patient taking differently: Inhale 2 puffs into  the lungs in the morning and at bedtime.) 10.7 g 5   ELIQUIS 5 MG TABS tablet Take 1 tablet (5 mg total) by mouth 2 (two) times daily. 60 tablet 3   folic acid (FOLVITE) 1 MG tablet Take 1 tablet (1 mg total) by mouth daily. 30 tablet 2   furosemide (LASIX) 20 MG tablet 1 tablet p.o. daily as needed for increased leg swelling. 20 tablet 0   gabapentin (NEURONTIN) 100 MG capsule Take 1 capsule (100 mg total) by mouth 2 (two) times daily. 90 capsule 2   hydrOXYzine (ATARAX) 10 MG tablet Take 1 tablet (10 mg total) by mouth 3 (three) times daily as needed. 60 tablet 2   ipratropium-albuterol (DUONEB) 0.5-2.5 (3) MG/3ML SOLN Take 3 mLs by nebulization every 6 (six) hours as needed. 360 mL 0   levocetirizine (XYZAL) 5 MG tablet Take 5 mg by mouth at bedtime as needed (for seasonal allergies- if not taking generic Claritin).     lidocaine-prilocaine (EMLA) cream Apply 1 Application topically as needed. 30 g 2   LORazepam (ATIVAN) 1 MG tablet Take 1 tablet (1 mg total) by mouth as needed. 30 min Prior to MRI or brain radiation procedure 5 tablet 0   methylPREDNISolone (MEDROL DOSEPAK) 4 MG TBPK tablet Use as instructed. 21 tablet 0   ondansetron (ZOFRAN) 8 MG tablet Take 1 tablet (8 mg total) by mouth every 8 (eight) hours as needed for nausea or vomiting. 30 tablet 2   oxyCODONE (OXY IR/ROXICODONE) 5 MG immediate release tablet Take 1 tablet (5 mg total) by mouth every 4 (four) hours as needed for severe pain. 12 tablet 0   pantoprazole (PROTONIX) 20 MG tablet Take 20 mg by mouth every evening.     telmisartan (MICARDIS) 20 MG tablet Take 1 tablet (20 mg total) by mouth daily. NEED OV. 15 tablet 0   traMADol (ULTRAM) 50 MG tablet Take 2 tablets (100 mg total) by mouth every 6 (six) hours as needed for severe pain. (Patient taking differently: Take 50 mg by mouth every 6 (six) hours as needed for severe pain.) 30 tablet 0   Zinc Acetate, Oral, (ZINC ACETATE PO) Take 1 tablet by mouth daily. Received from  herbalist     No current facility-administered medications for this visit.    SURGICAL HISTORY:  Past Surgical History:  Procedure Laterality Date   APPENDECTOMY     APPLICATION OF CRANIAL NAVIGATION N/A 07/11/2022   Procedure: APPLICATION OF CRANIAL NAVIGATION;  Surgeon: Dawley, Alan Mulder, DO;  Location: [facility] OR;  Service: Neurosurgery;  Laterality: N/A;   BRONCHIAL BIOPSY  06/12/2021   Procedure: BRONCHIAL BIOPSIES;  Surgeon: Josephine Igo, DO;  Location: [facility] ENDOSCOPY;  Service: Pulmonary;;   BRONCHIAL BRUSHINGS  06/12/2021   Procedure: BRONCHIAL BRUSHINGS;  Surgeon: Josephine Igo, DO;  Location:  [facility] ENDOS[facility];  Service: Pulmonary;;   BRONCHIAL WASHINGS  06/12/2021   Procedure: BRONCHIAL WASHINGS;  Surgeon: Josephine Igo, DO;  Location: [facility] ENDOSCOPY;  Service: Pulmonary;;   CHOLECYSTECTOMY     COLON RESECTION     CRANIOTOMY N/A 07/11/2022   Procedure: Craniotomy for resection of tumor;  Surgeon: Dawley, Alan Mulder, DO;  Location: [facility] OR;  Service: Neurosurgery;  Laterality: N/A;  RM 19   FIDUCIAL MARKER PLACEMENT  06/12/2021   Procedure: FIDUCIAL MARKER PLACEMENT;  Surgeon: Josephine Igo, DO;  Location: [facility];  Service: Pulmonary;;   hernia     x 2   IR IMAGING GUIDED PORT INSERTION  10/11/2022   TONSILLECTOMY     VIDEO BRONCHOSCOPY WITH ENDOBRONCHIAL NAVIGATION Bilateral 06/12/2021   Procedure: VIDEO BRONCHOSCOPY WITH ENDOBRONCHIAL NAVIGATION;  Surgeon: Josephine Igo, DO;  Location: [facility];  Service: Pulmonary;  Laterality: Bilateral;  ION   VIDEO BRONCHOSCOPY WITH RADIAL ENDOBRONCHIAL ULTRASOUND  06/12/2021   Procedure: RADIAL ENDOBRONCHIAL ULTRASOUND;  Surgeon: Josephine Igo, DO;  Location: [facility] END[facility];  Service: Pulmonary;;    REVIEW OF SYSTEMS:   Review of Systems  Constitutional: Positive for fatigue.  Negative for appetite change, chills,fever and unexpected weight change.  HENT: Negative for mouth sores, nosebleeds, sore throat and trouble swallowing.    Eyes: Negative for eye problems and icterus.  Respiratory: Stable dyspnea on exertion.  Negative for cough, hemoptysis, and wheezing.   Cardiovascular: Negative for chest pain.  Positive for lower extremity swelling (chronic-improved from prior)  Gastrointestinal: Positive for abdominal discomfort and constipation.  Positive for nausea.  Negative for diarrhea and vomiting.  Genitourinary: Negative for bladder incontinence, difficulty urinating, dysuria, frequency and hematuria.   Musculoskeletal: Negative for back pain, gait problem, neck pain and neck stiffness.  Skin: Negative for itching and rash.  Neurological: Negative for dizziness, extremity weakness, gait problem, headaches, light-headedness and seizures.  Hematological: Negative for adenopathy. Does not bruise/bleed easily.  Psychiatric/Behavioral: Negative for confusion, depression and sleep disturbance. The patient is not nervous/anxious.     PHYSICAL EXAMINATION:  Blood pressure 138/68, pulse (!) 51, temperature 98.2 F (36.8 C), temperature source Temporal, resp. rate 18, weight 196 lb (88.9 kg), SpO2 94%.  ECOG PERFORMANCE STATUS: 1  Physical Exam  Constitutional: Oriented to person, place, and time and well-developed, well-nourished, and in no distress.  HENT:  Head: Normocephalic and atraumatic.  Mouth/Throat: Oropharynx is clear and moist. No oropharyngeal exudate.  Eyes: Conjunctivae are normal. Right eye exhibits no discharge. Left eye exhibits no discharge. No scleral icterus.  Neck: Normal range of motion. Neck supple.  Cardiovascular: Normal rate, regular rhythm, normal heart sounds and intact distal pulses.   Pulmonary/Chest: Effort normal and breath sounds normal. No respiratory distress. Some mild wheezing on exam. No rales.  Abdominal: Soft.  Exhibits no distension and no mass. There is no significant tenderness.  No rebound tenderness. Musculoskeletal: Normal range of motion. Stable bilateral lower extremity  edema.  Lymphadenopathy:    No cervical adenopathy.  Neurological: Alert and oriented to person, place, and time. Exhibits also wasting.  Examined in the chair.  Skin: Skin is warm and dry. No rash noted. Not diaphoretic. No erythema. No pallor.  Psychiatric: Mood, memory and judgment normal.  Vitals reviewed.  LABORATORY DATA: Lab Results  Component Value Date   WBC 4.0 07/08/2023   HGB 13.0 07/08/2023   HCT 38.7 07/08/2023   MCV 101.8 (H) 07/08/2023   PLT 191 07/08/2023  Chemistry      Component Value Date/Time   NA 138 07/08/2023 0946   NA 139 09/17/2019 1550   K 4.2 07/08/2023 0946   CL 104 07/08/2023 0946   CO2 27 07/08/2023 0946   BUN 15 07/08/2023 0946   BUN 9 09/17/2019 1550   CREATININE 0.76 07/08/2023 0946      Component Value Date/Time   CALCIUM 9.2 07/08/2023 0946   ALKPHOS 65 07/08/2023 0946   AST 16 07/08/2023 0946   ALT 15 07/08/2023 0946   BILITOT 0.5 07/08/2023 0946       RADIOGRAPHIC STUDIES:  CT CHEST ABDOMEN PELVIS W CONTRAST  Result Date: 07/04/2023 CLINICAL DATA:  Non-small-cell lung cancer. Evaluate treatment response. * Tracking Code: BO * EXAM: CT CHEST, ABDOMEN, AND PELVIS WITH CONTRAST TECHNIQUE: Multidetector CT imaging of the chest, abdomen and pelvis was performed following the standard protocol during bolus administration of intravenous contrast. RADIATION DOSE REDUCTION: This exam was performed according to the departmental dose-optimization program which includes automated exposure control, adjustment of the mA and/or kV according to patient size and/or use of iterative reconstruction technique. CONTRAST:  OMNIPAQUE IOHEXOL 300 MG/ML  SOLN COMPARISON:  02/28/2023 staging CTs. In interval CTA of the chest of 04/09/2023 is also reviewed. FINDINGS: CT CHEST FINDINGS Cardiovascular: A Port-A-Cath tip mid right atrium. Aortic atherosclerosis. Tortuous thoracic aorta. Normal heart size, without pericardial effusion. Three vessel  coronary artery calcification. No central pulmonary embolism, on this non-dedicated study. Mediastinum/Nodes: Hypoattenuating left thyroid nodule of 2.3 cm has been previously evaluated. No supraclavicular adenopathy. No mediastinal or hilar adenopathy. Lungs/Pleura: Small left pleural effusion is similar. Mild centrilobular emphysema. Decreased right upper lobe pulmonary nodule at the site of fiducials. Ground-glass nodule measures 8 x 7 mm on 61/4 versus 12 x 10 mm on the prior. Again identified are treatment changes within the left upper lobe with airspace and less so ground-glass opacity. The posterior left upper lobe pulmonary nodule is not identified and presumably obscured. Superior segment left lower lobe pleural-based nodule measures 5 x 6 mm on 47/4 versus 5 x 5 mm on the prior, felt to be similar. Tiny right lung calcified granulomas. Musculoskeletal: No acute osseous abnormality. CT ABDOMEN PELVIS FINDINGS Hepatobiliary: Normal liver. Cholecystectomy, without biliary ductal dilatation. Pancreas: Fatty replaced pancreatic body and head. No duct dilatation or acute inflammation. Spleen: Normal in size, without focal abnormality. Adrenals/Urinary Tract: Normal adrenal glands. Interpolar left renal too small to characterize lesion is most likely a cyst . In the absence of clinically indicated signs/symptoms require(s) no independent follow-up. Normal right kidney. No hydronephrosis. Normal urinary bladder. Stomach/Bowel: Normal stomach, without wall thickening. Surgical sutures in the sigmoid. Colonic stool burden suggests constipation. Nonobstructive transverse colon again positioned within a right paramidline ventral abdominal wall hernia. Normal terminal ileum. Normal small bowel. Vascular/Lymphatic: Advanced aortic and branch vessel atherosclerosis. No abdominopelvic adenopathy. Reproductive: Retroverted uterus. Endometrial thickening again identified at 14 mm on 100/2. No adnexal mass. Other: No  significant free fluid. No evidence of omental or peritoneal disease. A separate more cephalad fat containing small right paramidline ventral abdominal wall hernia. Musculoskeletal: Presumed sebaceous cyst superficial the left iliac at 3.8 cm on 94/2 measured 3.3 cm on 02/28/2023. Disc bulge at L4-5. IMPRESSION: 1. Decreased size/definition of right upper lobe pulmonary nodule, adjacent to fiducial marker. 2. Left upper lobe treatment effects, presumably obscuring previously described left upper lobe pulmonary nodule. 3. No thoracic adenopathy. 4. Similar small left pleural effusion. 5. Similar superior segment left lower  lobe pulmonary nodule. Recommend attention on follow-up. 6. No acute process or evidence of metastatic disease in the abdomen or pelvis. 7. Ventral abdominal wall hernias containing fat and nonobstructive transverse colon, as before. 8. Chronic endometrial thickening which should be correlated with postmenopausal bleeding. 9.  Possible constipation. 10. Coronary artery atherosclerosis. Aortic Atherosclerosis (ICD10-I70.0). Emphysema (ICD10-J43.9). Electronically Signed   By: Jeronimo Greaves M.D.   On: 07/04/2023 11:42     ASSESSMENT/PLAN:  This is a very pleasant 82 year old Caucasian female with likely stage IV (T2a, N3, M1B) non-small cell lung cancer, adenocarcinoma presented with left upper lobe perihilar mass in addition to left hilar and mediastinal lymphadenopathy as well as left supraclavicular lymphadenopathy and a solitary brain metastasis diagnosed in September 2023. The molecular studies showed positive KRAS G12C mutation. Discussed this can be used in the second line setting in the future.    The patient underwent SRS and craniotomy under the care of Dr. Basilio Cairo and Dr. Jake Samples on 07/10/22. Dr. Jake Samples does not recommend any chemotherapy for at least 1 month from her surgery due to wound healing.    She is recently completed radiation to the chest under the care of Dr. Roselind Messier. The  last day of radiation was scheduled for 08/23/22  The plan is to undergo systemic chemotherapy wit carboplatin for an AUC of 5, Alimta 500 mg/m, Keytruda 200 mg IV every 3 weeks. She is status post 12 cycles.  Starting from cycle #5, she started maintenance Alimta and Keytruda. Her dose of Alimta is reduced to 400 mg/m2.   The patient was seen with Dr. Arbutus Ped today.  Dr. Arbutus Ped personally and independently reviewed the scan and discussed results with the patient today.  The scan showed evidence of disease progression.  The scan did not show any acute process in the abdomen and pelvis except some constipation for which her scan was on 9/16 and she has had bowel movements since then.  Her last bowel movement was 2 days ago.  He also has ventral wall hernias containing fat and nonobstructed transverse colon as prior.  Scan also mentions chronic endometrial thickening.  The patient denies any postmenopausal bleeding.  The patient does not feel up for treatment today.  She would like to go home and rest and take a laxative.  We will reschedule her treatment for next week.  However, the patient understands if she develops any persistent abdominal pain, constipation, nausea or vomiting, fevers, blood in the stool, etc. that she would need to seek emergency room evaluation as this could be a sign of obstruction.  She expressed understanding.  She is going to go home and take a laxative.  Will come back in 1 week for evaluation and repeat blood work before starting cycle #13.  We will then see her in 4 weeks for evaluation repeat blood work before undergoing cycle #14.   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  She we will continue to follow with the vein specialist for her leg edema.  The patient was advised to call immediately if she has any concerning symptoms in the interval. The patient voices understanding of current disease status and treatment options and is in agreement with the  current care plan. All questions were answered. The patient knows to call the clinic with any problems, questions or concerns. We can certainly see the patient much sooner if necessary     Orders Placed This Encounter  Procedures   CBC with Differential (  Cancer Center Only)    Standing Status:   Future    Standing Expiration Date:   07/14/2024   CMP (Cancer Center only)    Standing Status:   Future    Standing Expiration Date:   07/14/2024      Johnette Abraham Lyrah Bradt, PA-C 07/08/23  ADDENDUM: Hematology/oncology Attending: I had a face-to-face encounter with the patient today.  I reviewed her record, lab, scan and recommended her care plan.  This is a very pleasant 82 years old white female with stage IV non-small cell lung cancer, adenocarcinoma diagnosed in September 2023 with positive KRAS G12C mutation.  She was found at the time of diagnosis to have brain metastasis treated with [facility] as well as craniotomy and radiotherapy to the chest.  The patient started systemic chemotherapy initially with carboplatin, Alimta and Keytruda for 4 cycles and starting from cycle #5 she has been on maintenance treatment with Alimta and Keytruda every 3 weeks status post total of 12 cycles.  She has some intolerance to Alimta and her dose was reduced to 400 Mg/M2.  The patient did not feel well today with some abdominal bloating and constipation and she did not feel well to proceed with her systemic chemo today as scheduled. She had repeat CT scan of the chest, abdomen and pelvis performed recently. I personally and independently reviewed the scan and discussed the result with the patient and her husband. Her scan showed decrease in the size of the right upper lobe pulmonary nodule with other stable disease. I recommended for her to continue her current treatment with the same regimen but I will delay the start of cycle number 13 by 1 week until improvement of her general condition.  She was advised to  take MiraLAX or milk of magnesia if needed for the constipation and abdominal bloating. She was advised to go immediately to the emergency department if she have any concerning symptoms for abdominal obstruction with nausea, vomiting and persistent constipation. She will come back for follow-up visit next week for evaluation before resuming her treatment. The patient was advised to call immediately if she has any other concerning symptoms in the interval. The total time spent in the appointment was 30 minutes. Disclaimer: This note was dictated with voice recognition software. Similar sounding words can inadvertently be transcribed and may be missed upon review. Lajuana Matte, MD

## 2023-07-08 ENCOUNTER — Inpatient Hospital Stay (HOSPITAL_BASED_OUTPATIENT_CLINIC_OR_DEPARTMENT_OTHER): Payer: Medicare Other | Admitting: Physician Assistant

## 2023-07-08 ENCOUNTER — Inpatient Hospital Stay: Payer: Medicare Other

## 2023-07-08 ENCOUNTER — Inpatient Hospital Stay: Payer: Medicare Other | Attending: Internal Medicine

## 2023-07-08 VITALS — BP 138/68 | HR 51 | Temp 98.2°F | Resp 18 | Wt 196.0 lb

## 2023-07-08 DIAGNOSIS — C3412 Malignant neoplasm of upper lobe, left bronchus or lung: Secondary | ICD-10-CM | POA: Diagnosis present

## 2023-07-08 DIAGNOSIS — I2510 Atherosclerotic heart disease of native coronary artery without angina pectoris: Secondary | ICD-10-CM | POA: Insufficient documentation

## 2023-07-08 DIAGNOSIS — Z5111 Encounter for antineoplastic chemotherapy: Secondary | ICD-10-CM | POA: Insufficient documentation

## 2023-07-08 DIAGNOSIS — Z87442 Personal history of urinary calculi: Secondary | ICD-10-CM | POA: Insufficient documentation

## 2023-07-08 DIAGNOSIS — Z79631 Long term (current) use of antimetabolite agent: Secondary | ICD-10-CM | POA: Diagnosis not present

## 2023-07-08 DIAGNOSIS — Z7901 Long term (current) use of anticoagulants: Secondary | ICD-10-CM | POA: Insufficient documentation

## 2023-07-08 DIAGNOSIS — C3490 Malignant neoplasm of unspecified part of unspecified bronchus or lung: Secondary | ICD-10-CM

## 2023-07-08 DIAGNOSIS — K5900 Constipation, unspecified: Secondary | ICD-10-CM | POA: Diagnosis not present

## 2023-07-08 DIAGNOSIS — I700 Atherosclerosis of aorta: Secondary | ICD-10-CM | POA: Diagnosis not present

## 2023-07-08 DIAGNOSIS — Z79899 Other long term (current) drug therapy: Secondary | ICD-10-CM | POA: Insufficient documentation

## 2023-07-08 DIAGNOSIS — K439 Ventral hernia without obstruction or gangrene: Secondary | ICD-10-CM | POA: Insufficient documentation

## 2023-07-08 DIAGNOSIS — J4489 Other specified chronic obstructive pulmonary disease: Secondary | ICD-10-CM | POA: Insufficient documentation

## 2023-07-08 DIAGNOSIS — R600 Localized edema: Secondary | ICD-10-CM | POA: Diagnosis not present

## 2023-07-08 DIAGNOSIS — R9389 Abnormal findings on diagnostic imaging of other specified body structures: Secondary | ICD-10-CM | POA: Diagnosis not present

## 2023-07-08 DIAGNOSIS — M51369 Other intervertebral disc degeneration, lumbar region without mention of lumbar back pain or lower extremity pain: Secondary | ICD-10-CM | POA: Diagnosis not present

## 2023-07-08 DIAGNOSIS — Z79620 Long term (current) use of immunosuppressive biologic: Secondary | ICD-10-CM | POA: Diagnosis not present

## 2023-07-08 DIAGNOSIS — R1084 Generalized abdominal pain: Secondary | ICD-10-CM | POA: Insufficient documentation

## 2023-07-08 DIAGNOSIS — J8410 Pulmonary fibrosis, unspecified: Secondary | ICD-10-CM | POA: Insufficient documentation

## 2023-07-08 DIAGNOSIS — R110 Nausea: Secondary | ICD-10-CM | POA: Diagnosis not present

## 2023-07-08 DIAGNOSIS — Z923 Personal history of irradiation: Secondary | ICD-10-CM | POA: Diagnosis not present

## 2023-07-08 DIAGNOSIS — C7931 Secondary malignant neoplasm of brain: Secondary | ICD-10-CM | POA: Insufficient documentation

## 2023-07-08 DIAGNOSIS — Z9089 Acquired absence of other organs: Secondary | ICD-10-CM | POA: Insufficient documentation

## 2023-07-08 DIAGNOSIS — F419 Anxiety disorder, unspecified: Secondary | ICD-10-CM | POA: Insufficient documentation

## 2023-07-08 DIAGNOSIS — Z5112 Encounter for antineoplastic immunotherapy: Secondary | ICD-10-CM | POA: Diagnosis not present

## 2023-07-08 DIAGNOSIS — J432 Centrilobular emphysema: Secondary | ICD-10-CM | POA: Insufficient documentation

## 2023-07-08 DIAGNOSIS — Z888 Allergy status to other drugs, medicaments and biological substances status: Secondary | ICD-10-CM | POA: Diagnosis not present

## 2023-07-08 DIAGNOSIS — Z95828 Presence of other vascular implants and grafts: Secondary | ICD-10-CM

## 2023-07-08 DIAGNOSIS — Z9049 Acquired absence of other specified parts of digestive tract: Secondary | ICD-10-CM | POA: Insufficient documentation

## 2023-07-08 LAB — CBC WITH DIFFERENTIAL (CANCER CENTER ONLY)
Abs Immature Granulocytes: 0.01 10*3/uL (ref 0.00–0.07)
Basophils Absolute: 0 10*3/uL (ref 0.0–0.1)
Basophils Relative: 1 %
Eosinophils Absolute: 0.1 10*3/uL (ref 0.0–0.5)
Eosinophils Relative: 3 %
HCT: 38.7 % (ref 36.0–46.0)
Hemoglobin: 13 g/dL (ref 12.0–15.0)
Immature Granulocytes: 0 %
Lymphocytes Relative: 15 %
Lymphs Abs: 0.6 10*3/uL — ABNORMAL LOW (ref 0.7–4.0)
MCH: 34.2 pg — ABNORMAL HIGH (ref 26.0–34.0)
MCHC: 33.6 g/dL (ref 30.0–36.0)
MCV: 101.8 fL — ABNORMAL HIGH (ref 80.0–100.0)
Monocytes Absolute: 0.4 10*3/uL (ref 0.1–1.0)
Monocytes Relative: 10 %
Neutro Abs: 2.9 10*3/uL (ref 1.7–7.7)
Neutrophils Relative %: 71 %
Platelet Count: 191 10*3/uL (ref 150–400)
RBC: 3.8 MIL/uL — ABNORMAL LOW (ref 3.87–5.11)
RDW: 13.9 % (ref 11.5–15.5)
WBC Count: 4 10*3/uL (ref 4.0–10.5)
nRBC: 0 % (ref 0.0–0.2)

## 2023-07-08 LAB — CMP (CANCER CENTER ONLY)
ALT: 15 U/L (ref 0–44)
AST: 16 U/L (ref 15–41)
Albumin: 4 g/dL (ref 3.5–5.0)
Alkaline Phosphatase: 65 U/L (ref 38–126)
Anion gap: 7 (ref 5–15)
BUN: 15 mg/dL (ref 8–23)
CO2: 27 mmol/L (ref 22–32)
Calcium: 9.2 mg/dL (ref 8.9–10.3)
Chloride: 104 mmol/L (ref 98–111)
Creatinine: 0.76 mg/dL (ref 0.44–1.00)
GFR, Estimated: >60 mL/min (ref >60)
Glucose, Bld: 200 mg/dL — ABNORMAL HIGH (ref 70–99)
Potassium: 4.2 mmol/L (ref 3.5–5.1)
Sodium: 138 mmol/L (ref 135–145)
Total Bilirubin: 0.5 mg/dL (ref 0.3–1.2)
Total Protein: 6.5 g/dL (ref 6.5–8.1)

## 2023-07-08 MED ORDER — SODIUM CHLORIDE 0.9% FLUSH
10.0000 mL | Freq: Once | INTRAVENOUS | Status: AC
  Administered 2023-07-08: 10 mL

## 2023-07-08 MED ORDER — HEPARIN SOD (PORK) LOCK FLUSH 100 UNIT/ML IV SOLN
500.0000 [IU] | Freq: Once | INTRAVENOUS | Status: AC
  Administered 2023-07-08: 500 [IU]

## 2023-07-08 NOTE — Progress Notes (Signed)
Per Cassie PA-C, HOLD tx today d/t Pt not feeling well. This RN de-accessed Pt's PAC. Pt assisted via w/c to lobby by NT at discharge. At the time of discharge, Pt in stable condition without complaints.

## 2023-07-09 ENCOUNTER — Other Ambulatory Visit: Payer: Self-pay

## 2023-07-15 ENCOUNTER — Inpatient Hospital Stay: Payer: Medicare Other

## 2023-07-15 ENCOUNTER — Other Ambulatory Visit: Payer: Self-pay

## 2023-07-15 VITALS — BP 130/54 | HR 84 | Temp 99.0°F | Resp 18 | Ht 66.0 in | Wt 197.0 lb

## 2023-07-15 DIAGNOSIS — Z5112 Encounter for antineoplastic immunotherapy: Secondary | ICD-10-CM | POA: Diagnosis not present

## 2023-07-15 DIAGNOSIS — C3490 Malignant neoplasm of unspecified part of unspecified bronchus or lung: Secondary | ICD-10-CM

## 2023-07-15 DIAGNOSIS — Z95828 Presence of other vascular implants and grafts: Secondary | ICD-10-CM

## 2023-07-15 LAB — CBC WITH DIFFERENTIAL (CANCER CENTER ONLY)
Abs Immature Granulocytes: 0.02 10*3/uL (ref 0.00–0.07)
Basophils Absolute: 0.1 10*3/uL (ref 0.0–0.1)
Basophils Relative: 1 %
Eosinophils Absolute: 0.1 10*3/uL (ref 0.0–0.5)
Eosinophils Relative: 2 %
HCT: 35.9 % — ABNORMAL LOW (ref 36.0–46.0)
Hemoglobin: 12.1 g/dL (ref 12.0–15.0)
Immature Granulocytes: 0 %
Lymphocytes Relative: 17 %
Lymphs Abs: 0.8 10*3/uL (ref 0.7–4.0)
MCH: 34 pg (ref 26.0–34.0)
MCHC: 33.7 g/dL (ref 30.0–36.0)
MCV: 100.8 fL — ABNORMAL HIGH (ref 80.0–100.0)
Monocytes Absolute: 0.3 10*3/uL (ref 0.1–1.0)
Monocytes Relative: 8 %
Neutro Abs: 3.2 10*3/uL (ref 1.7–7.7)
Neutrophils Relative %: 72 %
Platelet Count: 190 10*3/uL (ref 150–400)
RBC: 3.56 MIL/uL — ABNORMAL LOW (ref 3.87–5.11)
RDW: 13.3 % (ref 11.5–15.5)
WBC Count: 4.5 10*3/uL (ref 4.0–10.5)
nRBC: 0 % (ref 0.0–0.2)

## 2023-07-15 LAB — CMP (CANCER CENTER ONLY)
ALT: 13 U/L (ref 0–44)
AST: 15 U/L (ref 15–41)
Albumin: 4 g/dL (ref 3.5–5.0)
Alkaline Phosphatase: 64 U/L (ref 38–126)
Anion gap: 5 (ref 5–15)
BUN: 19 mg/dL (ref 8–23)
CO2: 29 mmol/L (ref 22–32)
Calcium: 9.2 mg/dL (ref 8.9–10.3)
Chloride: 104 mmol/L (ref 98–111)
Creatinine: 0.72 mg/dL (ref 0.44–1.00)
GFR, Estimated: >60 mL/min (ref >60)
Glucose, Bld: 108 mg/dL — ABNORMAL HIGH (ref 70–99)
Potassium: 4 mmol/L (ref 3.5–5.1)
Sodium: 138 mmol/L (ref 135–145)
Total Bilirubin: 0.5 mg/dL (ref 0.3–1.2)
Total Protein: 6.4 g/dL — ABNORMAL LOW (ref 6.5–8.1)

## 2023-07-15 MED ORDER — SODIUM CHLORIDE 0.9 % IV SOLN: Freq: Once | INTRAVENOUS | Status: AC

## 2023-07-15 MED ORDER — HEPARIN SOD (PORK) LOCK FLUSH 100 UNIT/ML IV SOLN
500.0000 [IU] | Freq: Once | INTRAVENOUS | Status: AC | PRN
  Administered 2023-07-15: 500 [IU]

## 2023-07-15 MED ORDER — SODIUM CHLORIDE 0.9% FLUSH
10.0000 mL | Freq: Once | INTRAVENOUS | Status: AC
  Administered 2023-07-15: 10 mL

## 2023-07-15 MED ORDER — SODIUM CHLORIDE 0.9% FLUSH
10.0000 mL | INTRAVENOUS | Status: DC | PRN
  Administered 2023-07-15: 10 mL

## 2023-07-15 MED ORDER — ONDANSETRON HCL 4 MG/2ML IJ SOLN
8.0000 mg | Freq: Once | INTRAMUSCULAR | Status: AC
  Administered 2023-07-15: 8 mg via INTRAVENOUS
  Filled 2023-07-15: qty 4

## 2023-07-15 MED ORDER — SODIUM CHLORIDE 0.9 % IV SOLN
400.0000 mg/m2 | Freq: Once | INTRAVENOUS | Status: AC
  Administered 2023-07-15: 800 mg via INTRAVENOUS
  Filled 2023-07-15: qty 20

## 2023-07-15 MED ORDER — SODIUM CHLORIDE 0.9 % IV SOLN
200.0000 mg | Freq: Once | INTRAVENOUS | Status: AC
  Administered 2023-07-15: 200 mg via INTRAVENOUS
  Filled 2023-07-15: qty 200

## 2023-07-15 NOTE — Patient Instructions (Signed)
[facility]  Discharge Instructions: Thank you for choosing [facility] to provide your oncology and hematology care.   If you have a lab appointment with the [facility], please go directly to the [facility] and check in at the registration area.   Wear comfortable clothing and clothing appropriate for easy access to any Portacath or PICC line.   We strive to give you quality time with your provider. You may need to reschedule your appointment if you arrive late (15 or more minutes).  Arriving late affects you and other patients whose appointments are after yours.  Also, if you miss three or more appointments without notifying the office, you may be dismissed from the clinic at the provider's discretion.      For prescription refill requests, have your pharmacy contact our office and allow 72 hours for refills to be completed.    Today you received the following chemotherapy and/or immunotherapy agents: Keytruda, Alimta.       To help prevent nausea and vomiting after your treatment, we encourage you to take your nausea medication as directed.  BELOW ARE SYMPTOMS THAT SHOULD BE REPORTED IMMEDIATELY: *FEVER GREATER THAN 100.4 F (38 C) OR HIGHER *CHILLS OR SWEATING *NAUSEA AND VOMITING THAT IS NOT CONTROLLED WITH YOUR NAUSEA MEDICATION *UNUSUAL SHORTNESS OF BREATH *UNUSUAL BRUISING OR BLEEDING *URINARY PROBLEMS (pain or burning when urinating, or frequent urination) *BOWEL PROBLEMS (unusual diarrhea, constipation, pain near the anus) TENDERNESS IN MOUTH AND THROAT WITH OR WITHOUT PRESENCE OF ULCERS (sore throat, sores in mouth, or a toothache) UNUSUAL RASH, SWELLING OR PAIN  UNUSUAL VAGINAL DISCHARGE OR ITCHING   Items with * indicate a potential emergency and should be followed up as soon as possible or go to the Emergency Department if any problems should occur.  Please show the CHEMOTHERAPY ALERT CARD or IMMUNOTHERAPY ALERT CARD  at check-in to the Emergency Department and triage nurse.  Should you have questions after your visit or need to cancel or reschedule your appointment, please contact [facility]  Dept: [phone]  and follow the prompts.  Office hours are 8:00 a.m. to 4:30 p.m. Monday - Friday. Please note that voicemails left after 4:00 p.m. may not be returned until the following business day.  We are closed weekends and major holidays. You have access to a nurse at all times for urgent questions. Please call the main number to the clinic Dept: [phone] and follow the prompts.   For any non-urgent questions, you may also contact your provider using MyChart. We now offer e-Visits for anyone 34 and older to request care online for non-urgent symptoms. For details visit [email or web address].   Also download the MyChart app! Go to the app store, search "MyChart", open the app, select [facility], and log in with your MyChart username and password.

## 2023-07-22 ENCOUNTER — Telehealth: Payer: Self-pay | Admitting: Medical Oncology

## 2023-07-22 NOTE — Telephone Encounter (Signed)
Coughing  , L sided wheezing, "noisy chest" . Refuses to go to Urgent care or ED. She is requesting an antibiotic. 10/08-Keytruda and Alimta

## 2023-07-23 ENCOUNTER — Telehealth: Payer: Self-pay | Admitting: Medical Oncology

## 2023-07-23 NOTE — Telephone Encounter (Signed)
LVM Per Dierdre Forth should take a breathing treatment at home or go to urgent care and we can schedule her [facility] appt tomorrow after she gets an c xray.  Pt called back and said she did a breathing tx, started Doxycycline  and prednisone yesterday that she had at home.  Today she said "I m am 100 % better".

## 2023-07-25 ENCOUNTER — Telehealth: Payer: Self-pay | Admitting: Internal Medicine

## 2023-07-25 NOTE — Telephone Encounter (Signed)
Called patient regarding upcoming appointments, left a voicemail. 

## 2023-07-29 ENCOUNTER — Ambulatory Visit: Payer: Medicare Other | Admitting: Physician Assistant

## 2023-07-29 ENCOUNTER — Other Ambulatory Visit: Payer: Medicare Other

## 2023-07-29 ENCOUNTER — Ambulatory Visit: Payer: Medicare Other

## 2023-07-30 ENCOUNTER — Other Ambulatory Visit: Payer: Self-pay

## 2023-08-02 NOTE — Progress Notes (Unsigned)
[facility] OFFICE PROGRESS NOTE  Laurie Manson, NP [phone] [place] [address]  DIAGNOSIS: Stage IV (T2 a, N3, M1b) non-small cell lung cancer, adenocarcinoma presented with left upper lobe perihilar mass in addition to left hilar and mediastinal lymphadenopathy as well as left supraclavicular lymphadenopathy with brain metastasis diagnosed in September 2023.   Laurie Mccoy   Detected Alteration(s) / Biomarker(s)          Associated FDA-approved therapies  Clinical Trial Availability          % cfDNA or Amplification   KRAS G12C approved by FDA Adagrasib, Sotorasib Yes    1.7%   TP53 R249S None Yes          1.5%  PRIOR THERAPY: 1) SRS the the metastatic brain lesion under the care of Dr. Basilio Cairo 2) Craniotomy on 07/10/22 under the care of Dr. Jake Samples 3) radiation to the chest under care of Dr. Roselind Messier.  Last dose on 08/23/2022  CURRENT THERAPY: Systemic chemotherapy with carboplatin for an AUC of 5, to 500 mg/m2, and Keytruda 200 mg IV every 3 weeks.  First dose expected on October 08, 2022. Starting from cycle #5 she will be on maintenance treatment with Alimta and Keytruda every 3 weeks. Status post 13 cycles. Will reduce her dose of alimta to 400 mg/m2 secondary to intolerance   INTERVAL HISTORY: Laurie Mccoy 82 y.o. female returns to the clinic today for a follow-up visit accompanied by her husband. The patient is currently undergoing palliative chemotherapy and immunotherapy.  She was last seen by myself a few weeks ago.   She was not feeling well at that time and requested to delay treatment by another week. She did undergo treatment. The week or two following treatment she called the clinic endorsing coughing, left sided wheezing, and "noisy chest". She had CXR which was negative for acute infection. She was treated with prednisone and doxycycline. She completed her doxycycline a few days ago.  She has stopped taking her prednisone but typically feels immediately  better upon taking prednisone.  She has 3 tablets left.  She is wanting to address her COPD today.  The patient was previously seen by [facility] pulmonary medicine but was discharged from the practice.  She currently sees a pulmonary provider through another institution.  She does not have any upcoming appointments.  She is currently on inhalers and uses nebulizers daily.  She does not feel like she has good control of her COPD.  There has been some question in the past about being on maintenance prednisone.  The patient understands that prednisone could decrease the effectiveness of her immunotherapy and her prednisone would likely need to be less then 10 mg.  Patient has not seen any of her other specialist while being treated for lung cancer.  She has not been following with her family doctor on a regular basis or her cardiologist.  She is wondering if she needed to if she is coming here every 3 weeks for treatment.  She also has had a hard subcutaneous area under her skin for many years.  In the past, she has had what sounds like an incision and drainage.  She previously saw dermatologist who told her in order to have it removed that she would need to see a Engineer, petroleum.  However, more recently the area has opened and she is having purulent discharge expressed from the area if she applies pressure.  The area is not painful or  erythematous.  She is wondering if she still needs to still see a plastic surgeon if the area is draining.   She denies any fever, chills, night sweats, or unexplained weight loss.  She denies any chest pain or hemoptysis.  She sometimes has mild nausea without any vomiting.  She has antiemetics.  She does struggle with insomnia.  She also struggles with constipation but is working on managing this better with magnesium, laxatives, and stool softeners.    She denies any rashes or skin changes. She is here for evaluation and repeat blood work before undergoing cycle #14.       MEDICAL HISTORY: Past Medical History:  Diagnosis Date   Anxiety    "had panic attacks years ago"   Asthma    COPD (chronic obstructive pulmonary disease) (HCC)    Goiter 2022   History of hiatal hernia    "dx in college, never bothered me"   History of kidney stones 2012   History of radiation therapy    Left Lung-07/10/22-08/23/22- Dr. Antony Blackbird   Hypertension    Lung cancer [facility])    Sleep apnea     ALLERGIES:  is allergic to compazine [prochlorperazine], norvasc [amlodipine], and trelegy ellipta [fluticasone-umeclidin-vilant].  MEDICATIONS:  Current Outpatient Medications  Medication Sig Dispense Refill   albuterol (VENTOLIN HFA) 108 (90 Base) MCG/ACT inhaler Inhale 2 puffs into the lungs every 6 (six) hours as needed for wheezing or shortness of breath. 18 g 0   ALPRAZolam (XANAX) 0.25 MG tablet Take 0.25 mg by mouth at bedtime as needed for anxiety.     bacitracin-polymyxin b (POLYSPORIN) ophthalmic ointment Place 1 Application into both eyes at bedtime.     Budeson-Glycopyrrol-Formoterol (BREZTRI AEROSPHERE) 160-9-4.8 MCG/ACT AERO INHALE 2 PUFFS BY MOUTH EVERY MORNING AND EVERY NIGHT AT BEDTIME (Patient taking differently: Inhale 2 puffs into the lungs in the morning and at bedtime.) 10.7 g 5   ELIQUIS 5 MG TABS tablet Take 1 tablet (5 mg total) by mouth 2 (two) times daily. 60 tablet 3   folic acid (FOLVITE) 1 MG tablet Take 1 tablet (1 mg total) by mouth daily. 30 tablet 2   furosemide (LASIX) 20 MG tablet 1 tablet p.o. daily as needed for increased leg swelling. 20 tablet 0   gabapentin (NEURONTIN) 100 MG capsule Take 1 capsule (100 mg total) by mouth 2 (two) times daily. 90 capsule 2   hydrOXYzine (ATARAX) 10 MG tablet Take 1 tablet (10 mg total) by mouth 3 (three) times daily as needed. 60 tablet 2   ipratropium-albuterol (DUONEB) 0.5-2.5 (3) MG/3ML SOLN Take 3 mLs by nebulization every 6 (six) hours as needed. 360 mL 0   levocetirizine (XYZAL) 5 MG tablet  Take 5 mg by mouth at bedtime as needed (for seasonal allergies- if not taking generic Claritin).     lidocaine-prilocaine (EMLA) cream Apply 1 Application topically as needed. 30 g 2   LORazepam (ATIVAN) 1 MG tablet Take 1 tablet (1 mg total) by mouth as needed. 30 min Prior to MRI or brain radiation procedure 5 tablet 0   ondansetron (ZOFRAN) 8 MG tablet Take 1 tablet (8 mg total) by mouth every 8 (eight) hours as needed for nausea or vomiting. 30 tablet 2   oxyCODONE (OXY IR/ROXICODONE) 5 MG immediate release tablet Take 1 tablet (5 mg total) by mouth every 4 (four) hours as needed for severe pain. 12 tablet 0   pantoprazole (PROTONIX) 20 MG tablet Take 20 mg by mouth every evening.  telmisartan (MICARDIS) 20 MG tablet Take 1 tablet (20 mg total) by mouth daily. NEED OV. 15 tablet 0   traMADol (ULTRAM) 50 MG tablet Take 2 tablets (100 mg total) by mouth every 6 (six) hours as needed for severe pain. (Patient taking differently: Take 50 mg by mouth every 6 (six) hours as needed for severe pain.) 30 tablet 0   Zinc Acetate, Oral, (ZINC ACETATE PO) Take 1 tablet by mouth daily. Received from herbalist     No current facility-administered medications for this visit.    SURGICAL HISTORY:  Past Surgical History:  Procedure Laterality Date   APPENDECTOMY     APPLICATION OF CRANIAL NAVIGATION N/A 07/11/2022   Procedure: APPLICATION OF CRANIAL NAVIGATION;  Surgeon: Dawley, Alan Mulder, DO;  Location: [facility] OR;  Service: Neurosurgery;  Laterality: N/A;   BRONCHIAL BIOPSY  06/12/2021   Procedure: BRONCHIAL BIOPSIES;  Surgeon: Josephine Igo, DO;  Location: [facility] ENDOSCOPY;  Service: Pulmonary;;   BRONCHIAL BRUSHINGS  06/12/2021   Procedure: BRONCHIAL BRUSHINGS;  Surgeon: Josephine Igo, DO;  Location: [facility] ENDOSCOPY;  Service: Pulmonary;;   BRONCHIAL WASHINGS  06/12/2021   Procedure: BRONCHIAL WASHINGS;  Surgeon: Josephine Igo, DO;  Location: [facility] ENDOSCOPY;  Service: Pulmonary;;   CHOLECYSTECTOMY      COLON RESECTION     CRANIOTOMY N/A 07/11/2022   Procedure: Craniotomy for resection of tumor;  Surgeon: Bethann Goo, DO;  Location: [facility] OR;  Service: Neurosurgery;  Laterality: N/A;  RM 19   FIDUCIAL MARKER PLACEMENT  06/12/2021   Procedure: FIDUCIAL MARKER PLACEMENT;  Surgeon: Josephine Igo, DO;  Location: [facility] ENDOS[facility];  Service: Pulmonary;;   hernia     x 2   IR IMAGING GUIDED PORT INSERTION  10/11/2022   TONSILLECTOMY     VIDEO BRONCHOSCOPY WITH ENDOBRONCHIAL NAVIGATION Bilateral 06/12/2021   Procedure: VIDEO BRONCHOSCOPY WITH ENDOBRONCHIAL NAVIGATION;  Surgeon: Josephine Igo, DO;  Location: [facility] END[facility];  Service: Pulmonary;  Laterality: Bilateral;  ION   VIDEO BRONCHOSCOPY WITH RADIAL ENDOBRONCHIAL ULTRASOUND  06/12/2021   Procedure: RADIAL ENDOBRONCHIAL ULTRASOUND;  Surgeon: Josephine Igo, DO;  Location: [facility] END[facility];  Service: Pulmonary;;    REVIEW OF SYSTEMS:   Constitutional: Positive for fatigue.  Negative for appetite change, chills,fever and unexpected weight change.  HENT: Negative for mouth sores, nosebleeds, sore throat and trouble swallowing.   Eyes: Negative for eye problems and icterus.  Respiratory: Positive dyspnea on exertion.  Negative for cough, hemoptysis, and wheezing.   Cardiovascular: Negative for chest pain.  Positive for lower extremity swelling (chronic-improved from prior)  Gastrointestinal: Positive for constipation. Positive for intermittent mild nausea.  Negative for diarrhea and vomiting.  Genitourinary: Negative for bladder incontinence, difficulty urinating, dysuria, frequency and hematuria.   Musculoskeletal: Negative for back pain, gait problem, neck pain and neck stiffness.  Skin: Negative for itching and rash.  Neurological: Negative for dizziness, extremity weakness, gait problem, headaches, light-headedness and seizures.  Hematological: Negative for adenopathy. Does not bruise/bleed easily.  Psychiatric/Behavioral: Negative for confusion,  depression and sleep disturbance. The patient is not nervous/anxious.      PHYSICAL EXAMINATION:  There were no vitals taken for this visit.  ECOG PERFORMANCE STATUS: 2  Physical Exam  Constitutional: Oriented to person, place, and time and well-developed, well-nourished, and in no distress.  HENT:  Head: Normocephalic and atraumatic.  Mouth/Throat: Oropharynx is clear and moist. No oropharyngeal exudate.  Eyes: Conjunctivae are normal. Right eye exhibits no discharge. Left eye exhibits no discharge. No scleral  icterus.  Neck: Normal range of motion. Neck supple.  Cardiovascular: Normal rate, regular rhythm, normal heart sounds and intact distal pulses.   Pulmonary/Chest: Effort normal. Quiet breath sounds bilaterally. No respiratory distress. Some mild wheezing on exam. No rales.  Abdominal: Soft.  Exhibits no distension and no mass. There is no significant tenderness.  No rebound tenderness. Musculoskeletal: Normal range of motion. Stable bilateral lower extremity edema.  Lymphadenopathy:    No cervical adenopathy.  Neurological: Alert and oriented to person, place, and time. Exhibits also wasting.  Examined in the chair.  Skin: Skin is warm and dry. Has significant volume of purulent discharged expressed from small opening on left buttock. No surrounding erythema or tenderness. No rash noted. Not diaphoretic. No pallor.  Psychiatric: Mood, memory and judgment normal.  Vitals reviewed.  LABORATORY DATA: Lab Results  Component Value Date   WBC 4.5 07/15/2023   HGB 12.1 07/15/2023   HCT 35.9 (L) 07/15/2023   MCV 100.8 (H) 07/15/2023   PLT 190 07/15/2023      Chemistry      Component Value Date/Time   NA 138 07/15/2023 1252   NA 139 09/17/2019 1550   K 4.0 07/15/2023 1252   CL 104 07/15/2023 1252   CO2 29 07/15/2023 1252   BUN 19 07/15/2023 1252   BUN 9 09/17/2019 1550   CREATININE 0.72 07/15/2023 1252      Component Value Date/Time   CALCIUM 9.2 07/15/2023 1252    ALKPHOS 64 07/15/2023 1252   AST 15 07/15/2023 1252   ALT 13 07/15/2023 1252   BILITOT 0.5 07/15/2023 1252       RADIOGRAPHIC STUDIES:  No results found.   ASSESSMENT/PLAN:  This is a very pleasant 82 year old Caucasian female with likely stage IV (T2a, N3, M1B) non-small cell lung cancer, adenocarcinoma presented with left upper lobe perihilar mass in addition to left hilar and mediastinal lymphadenopathy as well as left supraclavicular lymphadenopathy and a solitary brain metastasis diagnosed in September 2023. The molecular studies showed positive KRAS G12C mutation. Discussed this can be used in the second line setting in the future.    The patient underwent SRS and craniotomy under the care of Dr. Basilio Cairo and Dr. Jake Samples on 07/10/22. Dr. Jake Samples does not recommend any chemotherapy for at least 1 month from her surgery due to wound healing.    She is recently completed radiation to the chest under the care of Dr. Roselind Messier. The last day of radiation was scheduled for 08/23/22   The plan is to undergo systemic chemotherapy wit carboplatin for an AUC of 5, Alimta 500 mg/m, Keytruda 200 mg IV every 3 weeks. She is status post 13 cycles.  Starting from cycle #5, she started maintenance Alimta and Keytruda. Her dose of Alimta is reduced to 400 mg/m2.   Labs were reviewed.  She had a significant volume of purulent discharge expressed from the small opening of the area on her left buttock. She states this has been here for over a decade but was closed. She saw dermatologist in the past that told her she needed to see a plastic surgeon to have this removed.  The patient is wondering if she still needs to see a plastic surgeon if she is getting the discharge out.  I will send antibiotics in with Bactrim and send her topical Bactroban.  I would recommend that she follow-up with her PCP for this for further evaluation recommendations and to follow-up regarding the COPD.  We also had  a lengthy discussion  about the importance of seeing her other medical specialist for her other comorbidities. For example, she has not seen her cardiologist and her imaging typically shows CAD and atherosclerosis, which she had questions about today why we are not managing this. Her COPD seems to be troubling her more than her treatment with frequent COPD exacerbations over the last few months/year. She is going to resume her prednisone when she gets home with 10 mg. I stressed the importance of following with her pulmonologist.  We have written down a list of questions to asked them at her next appointment such as adjustments in her COPD management if needed/ consideration of pulmonary rehab.  The patient tends to feel significantly better when she is on prednisone.  She understands that prednisone can interfere with the efficacy of her immunotherapy.  If she is requiring maintenance steroids, it would be ideal if this were to 10 mg or less of prednisone.   Due to the drainage from her lesion on her left buttocks, she will only proceed with single agent Keytruda today.  We will hold her Alimta/chemotherapy today.   We will see her back in 3 weeks for evaluation and repeat blood work before undergoing cycle #15.   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  She we will continue to follow with the vein specialist for her leg edema.   The patient was advised to call immediately if she has any concerning symptoms in the interval. The patient voices understanding of current disease status and treatment options and is in agreement with the current care plan. All questions were answered. The patient knows to call the clinic with any problems, questions or concerns. We can certainly see the patient much sooner if necessary  No orders of the defined types were placed in this encounter.    The total time spent in the appointment was 30-39 minutes  Jalyah Weinheimer L [employer], PA-C 08/02/23

## 2023-08-05 ENCOUNTER — Ambulatory Visit
Admission: RE | Admit: 2023-08-05 | Discharge: 2023-08-05 | Disposition: A | Discharge status: Home / Self Care | Payer: Medicare Other | Source: Ambulatory Visit | Attending: Radiation Oncology | Admitting: Radiation Oncology

## 2023-08-05 DIAGNOSIS — C7931 Secondary malignant neoplasm of brain: Secondary | ICD-10-CM

## 2023-08-05 MED ORDER — HEPARIN SOD (PORK) LOCK FLUSH 100 UNIT/ML IV SOLN: 500.0000 [IU] | Freq: Once | INTRAVENOUS | Status: DC

## 2023-08-05 MED ORDER — SODIUM CHLORIDE 0.9% FLUSH: 10.0000 mL | INTRAVENOUS | Status: DC | PRN

## 2023-08-05 MED ORDER — GADOPICLENOL 0.5 MMOL/ML IV SOLN
9.0000 mL | Freq: Once | INTRAVENOUS | Status: AC | PRN
  Administered 2023-08-05: 9 mL via INTRAVENOUS

## 2023-08-06 ENCOUNTER — Other Ambulatory Visit: Payer: Self-pay

## 2023-08-06 ENCOUNTER — Inpatient Hospital Stay: Payer: Medicare Other

## 2023-08-06 ENCOUNTER — Inpatient Hospital Stay (HOSPITAL_BASED_OUTPATIENT_CLINIC_OR_DEPARTMENT_OTHER): Payer: Medicare Other | Admitting: Physician Assistant

## 2023-08-06 VITALS — BP 140/76 | HR 94 | Temp 98.2°F | Resp 19 | Ht 66.0 in | Wt 197.2 lb

## 2023-08-06 DIAGNOSIS — C3490 Malignant neoplasm of unspecified part of unspecified bronchus or lung: Secondary | ICD-10-CM

## 2023-08-06 DIAGNOSIS — Z95828 Presence of other vascular implants and grafts: Secondary | ICD-10-CM

## 2023-08-06 DIAGNOSIS — Z5112 Encounter for antineoplastic immunotherapy: Secondary | ICD-10-CM | POA: Diagnosis not present

## 2023-08-06 DIAGNOSIS — L089 Local infection of the skin and subcutaneous tissue, unspecified: Secondary | ICD-10-CM | POA: Diagnosis not present

## 2023-08-06 LAB — CBC WITH DIFFERENTIAL (CANCER CENTER ONLY)
Abs Immature Granulocytes: 0.01 10*3/uL (ref 0.00–0.07)
Basophils Absolute: 0 10*3/uL (ref 0.0–0.1)
Basophils Relative: 1 %
Eosinophils Absolute: 0 10*3/uL (ref 0.0–0.5)
Eosinophils Relative: 1 %
HCT: 36.5 % (ref 36.0–46.0)
Hemoglobin: 11.7 g/dL — ABNORMAL LOW (ref 12.0–15.0)
Immature Granulocytes: 0 %
Lymphocytes Relative: 11 %
Lymphs Abs: 0.5 10*3/uL — ABNORMAL LOW (ref 0.7–4.0)
MCH: 32.4 pg (ref 26.0–34.0)
MCHC: 32.1 g/dL (ref 30.0–36.0)
MCV: 101.1 fL — ABNORMAL HIGH (ref 80.0–100.0)
Monocytes Absolute: 0.4 10*3/uL (ref 0.1–1.0)
Monocytes Relative: 8 %
Neutro Abs: 3.7 10*3/uL (ref 1.7–7.7)
Neutrophils Relative %: 79 %
Platelet Count: 208 10*3/uL (ref 150–400)
RBC: 3.61 MIL/uL — ABNORMAL LOW (ref 3.87–5.11)
RDW: 13.7 % (ref 11.5–15.5)
WBC Count: 4.7 10*3/uL (ref 4.0–10.5)
nRBC: 0 % (ref 0.0–0.2)

## 2023-08-06 LAB — CMP (CANCER CENTER ONLY)
ALT: 12 U/L (ref 0–44)
AST: 14 U/L — ABNORMAL LOW (ref 15–41)
Albumin: 3.8 g/dL (ref 3.5–5.0)
Alkaline Phosphatase: 62 U/L (ref 38–126)
Anion gap: 7 (ref 5–15)
BUN: 16 mg/dL (ref 8–23)
CO2: 28 mmol/L (ref 22–32)
Calcium: 8.8 mg/dL — ABNORMAL LOW (ref 8.9–10.3)
Chloride: 103 mmol/L (ref 98–111)
Creatinine: 0.67 mg/dL (ref 0.44–1.00)
GFR, Estimated: >60 mL/min (ref >60)
Glucose, Bld: 124 mg/dL — ABNORMAL HIGH (ref 70–99)
Potassium: 3.8 mmol/L (ref 3.5–5.1)
Sodium: 138 mmol/L (ref 135–145)
Total Bilirubin: 0.4 mg/dL (ref 0.3–1.2)
Total Protein: 6.4 g/dL — ABNORMAL LOW (ref 6.5–8.1)

## 2023-08-06 MED ORDER — MUPIROCIN 2 % EX OINT: 1.0000 | TOPICAL_OINTMENT | Freq: Every day | CUTANEOUS | 0 refills | Status: DC

## 2023-08-06 MED ORDER — SODIUM CHLORIDE 0.9% FLUSH
10.0000 mL | INTRAVENOUS | Status: DC | PRN
  Administered 2023-08-06: 10 mL

## 2023-08-06 MED ORDER — SODIUM CHLORIDE 0.9 % IV SOLN
200.0000 mg | Freq: Once | INTRAVENOUS | Status: AC
  Administered 2023-08-06: 200 mg via INTRAVENOUS
  Filled 2023-08-06: qty 200

## 2023-08-06 MED ORDER — SODIUM CHLORIDE 0.9 % IV SOLN: Freq: Once | INTRAVENOUS | Status: AC

## 2023-08-06 MED ORDER — SODIUM CHLORIDE 0.9% FLUSH
10.0000 mL | Freq: Once | INTRAVENOUS | Status: AC
  Administered 2023-08-06: 10 mL

## 2023-08-06 MED ORDER — ONDANSETRON HCL 4 MG/2ML IJ SOLN
8.0000 mg | Freq: Once | INTRAMUSCULAR | Status: AC
  Administered 2023-08-06: 8 mg via INTRAVENOUS
  Filled 2023-08-06: qty 4

## 2023-08-06 MED ORDER — HEPARIN SOD (PORK) LOCK FLUSH 100 UNIT/ML IV SOLN
500.0000 [IU] | Freq: Once | INTRAVENOUS | Status: AC | PRN
Start: 2023-08-06 — End: 2023-08-06
  Administered 2023-08-06: 500 [IU]

## 2023-08-06 MED ORDER — SULFAMETHOXAZOLE-TRIMETHOPRIM 800-160 MG PO TABS: 1.0000 | ORAL_TABLET | Freq: Two times a day (BID) | ORAL | 0 refills | Status: DC

## 2023-08-06 NOTE — Patient Instructions (Signed)

## 2023-08-10 LAB — AEROBIC CULTURE W GRAM STAIN (SUPERFICIAL SPECIMEN): Gram Stain: NONE SEEN

## 2023-08-11 ENCOUNTER — Inpatient Hospital Stay: Payer: Medicare Other

## 2023-08-12 ENCOUNTER — Telehealth: Payer: Self-pay

## 2023-08-12 ENCOUNTER — Other Ambulatory Visit: Payer: Self-pay | Admitting: Physician Assistant

## 2023-08-12 DIAGNOSIS — C3490 Malignant neoplasm of unspecified part of unspecified bronchus or lung: Secondary | ICD-10-CM

## 2023-08-12 DIAGNOSIS — L089 Local infection of the skin and subcutaneous tissue, unspecified: Secondary | ICD-10-CM

## 2023-08-12 MED ORDER — AMOXICILLIN-POT CLAVULANATE 875-125 MG PO TABS: 1.0000 | ORAL_TABLET | Freq: Two times a day (BID) | ORAL | 0 refills | Status: DC

## 2023-08-12 NOTE — Telephone Encounter (Signed)
Spoke with pt in regards to culture results from 10/30.  Per Cassie, PA- Stop bactrim and start augmentin, Prescription sent to [facility] pharmacy in [place]. Informed pt to call with any concerns or questions.

## 2023-08-14 ENCOUNTER — Other Ambulatory Visit: Payer: Self-pay

## 2023-08-14 ENCOUNTER — Telehealth: Payer: Self-pay | Admitting: Medical Oncology

## 2023-08-14 NOTE — Telephone Encounter (Signed)
Per Cassie, I told pt to contact PCP for skin wound.

## 2023-08-18 ENCOUNTER — Ambulatory Visit: Payer: Medicare Other | Attending: Physician Assistant | Admitting: Emergency Medicine

## 2023-08-18 ENCOUNTER — Other Ambulatory Visit: Payer: Self-pay | Admitting: Emergency Medicine

## 2023-08-18 ENCOUNTER — Encounter: Payer: Self-pay | Admitting: Emergency Medicine

## 2023-08-18 VITALS — BP 146/62 | HR 86 | Ht 66.0 in | Wt 197.0 lb

## 2023-08-18 DIAGNOSIS — J449 Chronic obstructive pulmonary disease, unspecified: Secondary | ICD-10-CM | POA: Insufficient documentation

## 2023-08-18 DIAGNOSIS — I2510 Atherosclerotic heart disease of native coronary artery without angina pectoris: Secondary | ICD-10-CM | POA: Diagnosis present

## 2023-08-18 DIAGNOSIS — R0602 Shortness of breath: Secondary | ICD-10-CM | POA: Insufficient documentation

## 2023-08-18 DIAGNOSIS — G4733 Obstructive sleep apnea (adult) (pediatric): Secondary | ICD-10-CM | POA: Insufficient documentation

## 2023-08-18 DIAGNOSIS — D6859 Other primary thrombophilia: Secondary | ICD-10-CM | POA: Diagnosis present

## 2023-08-18 DIAGNOSIS — E785 Hyperlipidemia, unspecified: Secondary | ICD-10-CM | POA: Insufficient documentation

## 2023-08-18 DIAGNOSIS — I700 Atherosclerosis of aorta: Secondary | ICD-10-CM | POA: Diagnosis present

## 2023-08-18 DIAGNOSIS — I10 Essential (primary) hypertension: Secondary | ICD-10-CM | POA: Insufficient documentation

## 2023-08-18 DIAGNOSIS — M7989 Other specified soft tissue disorders: Secondary | ICD-10-CM | POA: Diagnosis present

## 2023-08-18 MED ORDER — TELMISARTAN 20 MG PO TABS: 20.0000 mg | ORAL_TABLET | Freq: Every day | ORAL | 0 refills | Status: DC

## 2023-08-18 NOTE — Patient Instructions (Signed)
Medication Instructions:  TAKE TELMISARTAN DAILY *If you need a refill on your cardiac medications before your next appointment, please call your pharmacy*   Lab Work: FASTING LIPID WITHIN 2-3 WEEKS If you have labs (blood work) drawn today and your tests are completely normal, you will receive your results only by: MyChart Message (if you have MyChart) OR A paper copy in the mail If you have any lab test that is abnormal or we need to change your treatment, we will call you to review the results.   Testing/Procedures:[address] Your physician has requested that you have an echocardiogram. Echocardiography is a painless test that uses sound waves to create images of your heart. It provides your doctor with information about the size and shape of your heart and how well your heart's chambers and valves are working. This procedure takes approximately one hour. There are no restrictions for this procedure. Please do NOT wear cologne, perfume, aftershave, or lotions (deodorant is allowed). Please arrive 15 minutes prior to your appointment time.  Please note: We ask at that you not bring children with you during ultrasound (echo/ vascular) testing. Due to room size and safety concerns, children are not allowed in the ultrasound rooms during exams. Our front office staff cannot provide observation of children in our lobby area while testing is being conducted. An adult accompanying a patient to their appointment will only be allowed in the ultrasound room at the discretion of the ultrasound technician under special circumstances. We apologize for any inconvenience.    Follow-Up: At [facility], you and your health needs are our priority.  As part of our continuing mission to provide you with exceptional heart care, we have created designated Provider Care Teams.  These Care Teams include your primary Cardiologist (physician) and Advanced Practice Providers (APPs -   Physician Assistants and Nurse Practitioners) who all work together to provide you with the care you need, when you need it.   Your next appointment:   6 week(s)  Provider:   Azalee Course, PA

## 2023-08-18 NOTE — Progress Notes (Signed)
Office Visit    Patient Name: Laurie Mccoy Date of Encounter: 08/18/2023  Primary Care Provider:  [name], NP Primary Cardiologist:  None  Chief Complaint    82 y.o. female with a history of nonobstructive CAD, chronic DVT, COPD, hypertension, tobacco abuse, sleep apnea, stage 4 NSCLC.  Past Medical History  Subjective   Past Medical History:  Diagnosis Date   Anxiety    "had panic attacks years ago"   Asthma    COPD (chronic obstructive pulmonary disease) (HCC)    Goiter 2022   History of hiatal hernia    "dx in college, never bothered me"   History of kidney stones 2012   History of radiation therapy    Left Lung-07/10/22-08/23/22- Dr. Antony Blackbird   Hypertension    Lung cancer [facility])    Sleep apnea    Past Surgical History:  Procedure Laterality Date   APPENDECTOMY     APPLICATION OF CRANIAL NAVIGATION N/A 07/11/2022   Procedure: APPLICATION OF CRANIAL NAVIGATION;  Surgeon: Bethann Goo, DO;  Location: [facility] OR;  Service: Neurosurgery;  Laterality: N/A;   BRONCHIAL BIOPSY  06/12/2021   Procedure: BRONCHIAL BIOPSIES;  Surgeon: Josephine Igo, DO;  Location: [facility] ENDOSCOPY;  Service: Pulmonary;;   BRONCHIAL BRUSHINGS  06/12/2021   Procedure: BRONCHIAL BRUSHINGS;  Surgeon: Josephine Igo, DO;  Location: [facility] ENDOSCOPY;  Service: Pulmonary;;   BRONCHIAL WASHINGS  06/12/2021   Procedure: BRONCHIAL WASHINGS;  Surgeon: Josephine Igo, DO;  Location: [facility];  Service: Pulmonary;;   CHOLECYSTECTOMY     COLON RESECTION     CRANIOTOMY N/A 07/11/2022   Procedure: Craniotomy for resection of tumor;  Surgeon: Bethann Goo, DO;  Location: [facility] OR;  Service: Neurosurgery;  Laterality: N/A;  RM 19   FIDUCIAL MARKER PLACEMENT  06/12/2021   Procedure: FIDUCIAL MARKER PLACEMENT;  Surgeon: Josephine Igo, DO;  Location: [facility];  Service: Pulmonary;;   hernia     x 2   IR IMAGING GUIDED PORT INSERTION  10/11/2022   TONSILLECTOMY     VIDEO BRONCHOSCOPY  WITH ENDOBRONCHIAL NAVIGATION Bilateral 06/12/2021   Procedure: VIDEO BRONCHOSCOPY WITH ENDOBRONCHIAL NAVIGATION;  Surgeon: Josephine Igo, DO;  Location: [facility];  Service: Pulmonary;  Laterality: Bilateral;  ION   VIDEO BRONCHOSCOPY WITH RADIAL ENDOBRONCHIAL ULTRASOUND  06/12/2021   Procedure: RADIAL ENDOBRONCHIAL ULTRASOUND;  Surgeon: Josephine Igo, DO;  Location: [facility];  Service: Pulmonary;;    Allergies  Allergies  Allergen Reactions   Compazine [Prochlorperazine]     Lose muscle control in face    Norvasc [Amlodipine] Swelling    Lower extremity swelling     Trelegy Ellipta [Fluticasone-Umeclidin-Vilant] Swelling    Swelling in legs and headache.      History of Present Illness      82 y.o. y/o female with a history of nonobstructive CAD, chronic DVT, COPD, hypertension, tobacco abuse, sleep apnea, stage 4 NSCLC.  Was initially seen by Dr. Bufford Buttner on 07/30/2019 for evaluation of chest pain.  Coronary CTA 09/23/2019 showing 25-49% CAD in LAD, LCx, RCA.  PFO was noted, coronary calcium score 662, this was a 9 percentile for age and sex.    She was last seen by cardiology on 06/2021. Since 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  The patient underwent SRS and craniotomy under the care of Dr. Basilio Cairo and Dr. Jake Samples on 07/10/22 . She recently completed radiation to the chest under the care of Dr. Roselind Messier. The last day of radiation was scheduled for 08/23/22. She was admitted to the hospital on 09/02/2022 for pneumonia of left lower lobe.  She is currently undergoing palliative chemotherapy and immunotherapy.  She is s/p 13 cycles of chemotherapy.  She has been seen by [facility] oncology and [facility]  pulmonology.   Today patient notes that she is doing well overall.  She tells me that the cancer in her brain has not come back and her lung cancer has been stable.  She notes ongoing shortness of breath and dyspnea on exertion that is chronic and has not acutely worsened.  She denies any chest pains or chest tightness but notes an occasional skipped beat feeling.  She notes ongoing mild lower extremity edema and seems to not improve with leg elevation or stockings.  Lower extremity edema started around the time she began chemotherapy.  She is not currently taking her Lasix due to urinary frequency.  She notes that her home blood pressures are normally in the 140s and she has been taking her telmisartan 20 mg only as needed when her blood pressures rise to the 140s-150's.  She is talking with the telmisartan roughly only 3 to 4 days a week.  She was previously on HCTZ but this was gets discontinued, patient is unsure why at this time.  She denies any tachycardia, syncope, near syncope, lightheadedness, dizziness, hemoptysis, melena Objective  Home Medications    Current Outpatient Medications  Medication Sig Dispense Refill   albuterol (VENTOLIN HFA) 108 (90 Base) MCG/ACT inhaler Inhale 2 puffs into the lungs every 6 (six) hours as needed for wheezing or shortness of breath. 18 g 0   ALPRAZolam (XANAX) 0.25 MG tablet Take 0.25 mg by mouth at bedtime as needed for anxiety.     amoxicillin-clavulanate (AUGMENTIN) 875-125 MG tablet Take 1 tablet by mouth 2 (two) times daily. 14 tablet 0   Budeson-Glycopyrrol-Formoterol (BREZTRI AEROSPHERE) 160-9-4.8 MCG/ACT AERO INHALE 2 PUFFS BY MOUTH EVERY MORNING AND EVERY NIGHT AT BEDTIME (Patient taking differently: Inhale 2 puffs into the lungs in the morning and at bedtime.) 10.7 g 5   ELIQUIS 5 MG TABS tablet Take 1 tablet (5 mg total) by mouth 2 (two) times daily. 60 tablet 3   folic acid (FOLVITE) 1 MG tablet TAKE ONE TABLET BY MOUTH EVERY DAY 30 tablet 2    furosemide (LASIX) 20 MG tablet 1 tablet p.o. daily as needed for increased leg swelling. 20 tablet 0   hydrOXYzine (ATARAX) 10 MG tablet Take 1 tablet (10 mg total) by mouth 3 (three) times daily as needed. 60 tablet 2   ipratropium-albuterol (DUONEB) 0.5-2.5 (3) MG/3ML SOLN Take 3 mLs by nebulization every 6 (six) hours as needed. 360 mL 0   levocetirizine (XYZAL) 5 MG tablet Take 5 mg by mouth at bedtime as needed (for seasonal allergies- if not taking generic Claritin).     lidocaine-prilocaine (EMLA) cream Apply 1 Application topically as needed. 30 g 2   LORazepam (ATIVAN) 1 MG tablet Take 1 tablet (1 mg total) by mouth as needed. 30 min Prior to MRI or brain radiation procedure 5 tablet 0   mupirocin ointment (BACTROBAN) 2 % Apply 1 Application topically daily. 22 g 0   ondansetron (ZOFRAN) 8 MG tablet Take 1  tablet (8 mg total) by mouth every 8 (eight) hours as needed for nausea or vomiting. 30 tablet 2   oxyCODONE (OXY IR/ROXICODONE) 5 MG immediate release tablet Take 1 tablet (5 mg total) by mouth every 4 (four) hours as needed for severe pain. 12 tablet 0   pantoprazole (PROTONIX) 20 MG tablet Take 20 mg by mouth every evening.     predniSONE (DELTASONE) 5 MG tablet Take 15 mg by mouth daily.     traMADol (ULTRAM) 50 MG tablet Take 2 tablets (100 mg total) by mouth every 6 (six) hours as needed for severe pain. (Patient taking differently: Take 50 mg by mouth every 6 (six) hours as needed for severe pain (pain score 7-10).) 30 tablet 0   Zinc Acetate, Oral, (ZINC ACETATE PO) Take 1 tablet by mouth daily. Received from herbalist     bacitracin-polymyxin b (POLYSPORIN) ophthalmic ointment Place 1 Application into both eyes at bedtime.     gabapentin (NEURONTIN) 100 MG capsule Take 1 capsule (100 mg total) by mouth 2 (two) times daily. 90 capsule 2   sulfamethoxazole-trimethoprim (BACTRIM DS) 800-160 MG tablet Take 1 tablet by mouth 2 (two) times daily. 20 tablet 0   telmisartan (MICARDIS) 20  MG tablet Take 1 tablet (20 mg total) by mouth daily. 15 tablet 0   No current facility-administered medications for this visit.     Physical Exam    VS:  BP (!) 146/62 (BP Location: Left Arm, Patient Position: Sitting)   Pulse 86   Ht 5\' 6"  (1.676 m)   Wt 197 lb (89.4 kg)   SpO2 91%   BMI 31.80 kg/m  , BMI Body mass index is 31.8 kg/m.       GEN: Well nourished, well developed, in no acute distress. HEENT: normal. Neck: Supple, no JVD, carotid bruits, or masses. Cardiac: RRR, no murmurs, rubs, or gallops. No clubbing, cyanosis, 2+ bilateral LE edema.  Radials 2+/PT 2+ and equal bilaterally.  Respiratory:  Respirations regular and unlabored, clear to auscultation bilaterally. GI: Soft, nontender, nondistended, BS + x 4. MS: no deformity or atrophy. Skin: warm and dry, no rash. Neuro:  Strength and sensation are intact. Psych: Normal affect.  Accessory Clinical Findings    ECG personally reviewed by me today - EKG Interpretation Date/Time:  Monday August 18 2023 14:53:33 EST Ventricular Rate:  88 PR Interval:  136 QRS Duration:  100 QT Interval:  346 QTC Calculation: 418 R Axis:   -7  Text Interpretation: Sinus rhythm with frequent Premature ventricular complexes Confirmed by Rise Paganini [phone]) on 08/18/2023 4:48:21 PM  - no acute changes.  Lab Results  Component Value Date   WBC 4.7 08/06/2023   HGB 11.7 (L) 08/06/2023   HCT 36.5 08/06/2023   MCV 101.1 (H) 08/06/2023   PLT 208 08/06/2023   Lab Results  Component Value Date   CREATININE 0.67 08/06/2023   BUN 16 08/06/2023   NA 138 08/06/2023   K 3.8 08/06/2023   CL 103 08/06/2023   CO2 28 08/06/2023   Lab Results  Component Value Date   ALT 12 08/06/2023   AST 14 (L) 08/06/2023   ALKPHOS 62 08/06/2023   BILITOT 0.4 08/06/2023   No results found for: "CHOL", "HDL", "LDLCALC", "LDLDIRECT", "TRIG", "CHOLHDL"  Lab Results  Component Value Date   HGBA1C 6.4 (H) 09/04/2022   Lab Results   Component Value Date   TSH 8.983 (H) 05/27/2023       Assessment & Plan  1.  Coronary Artery Disease / Aortic Atherosclerosis  -Stable with no anginal symptoms, no indication for ischemic evaluation  -Coronary CTA 09/2019, mild nonobstructive (25-49%) in LAD, LCx, RCA  -Aortic atherosclerosis noted on multiple CT chest scans -LDL 95 on 02/21/2022, not on lipid-lowering therapy -She does not have history of elevated LDLs, however with three-vessel CAD would prefer her to begin statin therapy with LDL goal less than 70  -Repeat lipid panel today, can start rosuvastatin 5mg  if not at goal -Encouraged high-fiber and vegetable diet  2.  Chronic DVT -Continue Eliquis 5 mg twice daily -No leg pain or one sided LE edema -No bleeding issues, CBC stable  3.  Hypertension -BP today 156/60, repeat 146/62 -Notes her home blood pressures average 140s -She has been taking her telmisartan 20 mg as needed for blood pressures over 140 -Will change telmisartan 20 mg from as needed to daily.  Of note she was on HCTZ but it was discontinued due to unknown circumstances over the past year -Can make adjustments on follow-up visit in 6 weeks -Take BP daily and note pressure in BP log  4. Stage 4 NSCLC / COPD -Currently undergoing chemotherapy and immunotherapy -Continue to follow with oncology and pulmonology  5.  Leg swelling / DOE -Ongoing mild bilateral lower extremity edema (2+) -She has chronic DOE that has been stable -Plan to establish baseline echocardiogram given above complaints and history of chemotherapy -She is not currently taking her Lasix due to urinary frequency. Will have her begin 20 mg Lasix as needed for weight gain and lower leg swelling  6. OSA -Reports compliance to CPAP     Dispo: Follow-up in 6 weeks  Denyce Robert, [place], AGNP-C 08/18/2023, 4:48 PM

## 2023-08-20 ENCOUNTER — Ambulatory Visit: Payer: Medicare Other

## 2023-08-20 ENCOUNTER — Ambulatory Visit: Payer: Medicare Other | Admitting: Physician Assistant

## 2023-08-20 ENCOUNTER — Other Ambulatory Visit: Payer: Medicare Other

## 2023-08-24 NOTE — Progress Notes (Unsigned)
[facility] OFFICE PROGRESS NOTE  Laurie Manson, NP [phone] [address] [place] [address] [place] [address]  DIAGNOSIS: tage IV (T2 a, N3, M1b) non-small cell lung cancer, adenocarcinoma presented with left upper lobe perihilar mass in addition to left hilar and mediastinal lymphadenopathy as well as left supraclavicular lymphadenopathy with brain metastasis diagnosed in September 2023.   [name]   Detected Alteration(s) / Biomarker(s)          Associated FDA-approved therapies  Clinical Trial Availability          % cfDNA or Amplification   KRAS G12C approved by FDA Adagrasib, Sotorasib Yes    1.7%   TP53 R249S None Yes          1.5%  PRIOR THERAPY:  1) SRS the the metastatic brain lesion under the care of Dr. Basilio Cairo 2) Craniotomy on 07/10/22 under the care of Dr. Jake Samples 3) radiation to the chest under care of Dr. Roselind Messier.  Last dose on 08/23/2022  CURRENT THERAPY: Systemic chemotherapy with carboplatin for an AUC of 5, to 500 mg/m2, and Keytruda 200 mg IV every 3 weeks.  First dose expected on October 08, 2022. Starting from cycle #5 she will be on maintenance treatment with Alimta and Keytruda every 3 weeks. Status post 14 cycles. Will reduce her dose of alimta to 400 mg/m2 secondary to intolerance   INTERVAL HISTORY: [place] 82 y.o. female returns  to the clinic today for a follow-up visit accompanied by her husband. The patient is currently undergoing palliative chemotherapy and immunotherapy.  She was last seen by myself 3 weeks ago. At that time, she had a subcutaneous skin lesion that had been there for many years. She previously was told she needed to see a surgeon to have it removed. This started draining. She was sent in antibiotics and followed up with *** who recommended ***referral to wound clinic to see if they would debride it.   Due to the draining lesion, her chemotherapy with alimta was held.   Also in the interval since last being seen, she saw her cardiologist who  ***  She also was advised to see her pulmonologist as she frequently struggles with COPD. The week or two following treatment she called the clinic endorsing coughing, left sided wheezing, and "noisy chest". She states she feels better with prednisone but she understands that this does interfere with immunotherapy and if needed, it would be preferred to be less than 10 mg.   She denies any fever, chills, night sweats, or unexplained weight loss.  She denies any chest pain or hemoptysis.  She sometimes has mild nausea without any vomiting.  She has antiemetics.  She does struggle with insomnia.  She also struggles with constipation but is working on managing this better with magnesium, laxatives, and stool softeners.     She denies any rashes or skin changes. She is here for evaluation and repeat blood work before undergoing cycle #15.   MEDICAL HISTORY: Past Medical History:  Diagnosis Date   Anxiety    "had panic attacks years ago"   Asthma    COPD (chronic obstructive pulmonary disease) (HCC)    Goiter 2022   History of hiatal hernia    "dx in college, never bothered me"   History of kidney stones 2012   History of radiation therapy    Left Lung-07/10/22-08/23/22- Dr. Antony Blackbird   Hypertension    Lung cancer [facility])    Sleep apnea  ALLERGIES:  is allergic to compazine [prochlorperazine], norvasc [amlodipine], and trelegy ellipta [fluticasone-umeclidin-vilant].  MEDICATIONS:  Current Outpatient Medications  Medication Sig Dispense Refill   albuterol (VENTOLIN HFA) 108 (90 Base) MCG/ACT inhaler Inhale 2 puffs into the lungs every 6 (six) hours as needed for wheezing or shortness of breath. 18 g 0   ALPRAZolam (XANAX) 0.25 MG tablet Take 0.25 mg by mouth at bedtime as needed for anxiety.     amoxicillin-clavulanate (AUGMENTIN) 875-125 MG tablet Take 1 tablet by mouth 2 (two) times daily. 14 tablet 0   bacitracin-polymyxin b (POLYSPORIN) ophthalmic ointment Place 1 Application into  both eyes at bedtime.     Budeson-Glycopyrrol-Formoterol (BREZTRI AEROSPHERE) 160-9-4.8 MCG/ACT AERO INHALE 2 PUFFS BY MOUTH EVERY MORNING AND EVERY NIGHT AT BEDTIME (Patient taking differently: Inhale 2 puffs into the lungs in the morning and at bedtime.) 10.7 g 5   ELIQUIS 5 MG TABS tablet Take 1 tablet (5 mg total) by mouth 2 (two) times daily. 60 tablet 3   folic acid (FOLVITE) 1 MG tablet TAKE ONE TABLET BY MOUTH EVERY DAY 30 tablet 2   furosemide (LASIX) 20 MG tablet 1 tablet p.o. daily as needed for increased leg swelling. 20 tablet 0   gabapentin (NEURONTIN) 100 MG capsule Take 1 capsule (100 mg total) by mouth 2 (two) times daily. 90 capsule 2   hydrOXYzine (ATARAX) 10 MG tablet Take 1 tablet (10 mg total) by mouth 3 (three) times daily as needed. 60 tablet 2   ipratropium-albuterol (DUONEB) 0.5-2.5 (3) MG/3ML SOLN Take 3 mLs by nebulization every 6 (six) hours as needed. 360 mL 0   levocetirizine (XYZAL) 5 MG tablet Take 5 mg by mouth at bedtime as needed (for seasonal allergies- if not taking generic Claritin).     lidocaine-prilocaine (EMLA) cream Apply 1 Application topically as needed. 30 g 2   LORazepam (ATIVAN) 1 MG tablet Take 1 tablet (1 mg total) by mouth as needed. 30 min Prior to MRI or brain radiation procedure 5 tablet 0   mupirocin ointment (BACTROBAN) 2 % Apply 1 Application topically daily. 22 g 0   ondansetron (ZOFRAN) 8 MG tablet Take 1 tablet (8 mg total) by mouth every 8 (eight) hours as needed for nausea or vomiting. 30 tablet 2   oxyCODONE (OXY IR/ROXICODONE) 5 MG immediate release tablet Take 1 tablet (5 mg total) by mouth every 4 (four) hours as needed for severe pain. 12 tablet 0   pantoprazole (PROTONIX) 20 MG tablet Take 20 mg by mouth every evening.     predniSONE (DELTASONE) 5 MG tablet Take 15 mg by mouth daily.     sulfamethoxazole-trimethoprim (BACTRIM DS) 800-160 MG tablet Take 1 tablet by mouth 2 (two) times daily. 20 tablet 0   telmisartan (MICARDIS) 20  MG tablet Take 1 tablet (20 mg total) by mouth daily. 15 tablet 0   traMADol (ULTRAM) 50 MG tablet Take 2 tablets (100 mg total) by mouth every 6 (six) hours as needed for severe pain. (Patient taking differently: Take 50 mg by mouth every 6 (six) hours as needed for severe pain (pain score 7-10).) 30 tablet 0   Zinc Acetate, Oral, (ZINC ACETATE PO) Take 1 tablet by mouth daily. Received from herbalist     No current facility-administered medications for this visit.    SURGICAL HISTORY:  Past Surgical History:  Procedure Laterality Date   APPENDECTOMY     APPLICATION OF CRANIAL NAVIGATION N/A 07/11/2022   Procedure: APPLICATION OF CRANIAL NAVIGATION;  Surgeon: Bethann Goo, DO;  Location: [facility] OR;  Service: Neurosurgery;  Laterality: N/A;   BRONCHIAL BIOPSY  06/12/2021   Procedure: BRONCHIAL BIOPSIES;  Surgeon: Josephine Igo, DO;  Location: [facility] ENDOSCOPY;  Service: Pulmonary;;   BRONCHIAL BRUSHINGS  06/12/2021   Procedure: BRONCHIAL BRUSHINGS;  Surgeon: Josephine Igo, DO;  Location: [facility] ENDOSCOPY;  Service: Pulmonary;;   BRONCHIAL WASHINGS  06/12/2021   Procedure: BRONCHIAL WASHINGS;  Surgeon: Josephine Igo, DO;  Location: [facility];  Service: Pulmonary;;   CHOLECYSTECTOMY     COLON RESECTION     CRANIOTOMY N/A 07/11/2022   Procedure: Craniotomy for resection of tumor;  Surgeon: Bethann Goo, DO;  Location: [facility] OR;  Service: Neurosurgery;  Laterality: N/A;  RM 19   FIDUCIAL MARKER PLACEMENT  06/12/2021   Procedure: FIDUCIAL MARKER PLACEMENT;  Surgeon: Josephine Igo, DO;  Location: [facility];  Service: Pulmonary;;   hernia     x 2   IR IMAGING GUIDED PORT INSERTION  10/11/2022   TONSILLECTOMY     VIDEO BRONCHOSCOPY WITH ENDOBRONCHIAL NAVIGATION Bilateral 06/12/2021   Procedure: VIDEO BRONCHOSCOPY WITH ENDOBRONCHIAL NAVIGATION;  Surgeon: Josephine Igo, DO;  Location: [facility];  Service: Pulmonary;  Laterality: Bilateral;  ION   VIDEO BRONCHOSCOPY WITH RADIAL  ENDOBRONCHIAL ULTRASOUND  06/12/2021   Procedure: RADIAL ENDOBRONCHIAL ULTRASOUND;  Surgeon: Josephine Igo, DO;  Location: [facility];  Service: Pulmonary;;    REVIEW OF SYSTEMS:   Review of Systems  Constitutional: Negative for appetite change, chills, fatigue, fever and unexpected weight change.  HENT:   Negative for mouth sores, nosebleeds, sore throat and trouble swallowing.   Eyes: Negative for eye problems and icterus.  Respiratory: Negative for cough, hemoptysis, shortness of breath and wheezing.   Cardiovascular: Negative for chest pain and leg swelling.  Gastrointestinal: Negative for abdominal pain, constipation, diarrhea, nausea and vomiting.  Genitourinary: Negative for bladder incontinence, difficulty urinating, dysuria, frequency and hematuria.   Musculoskeletal: Negative for back pain, gait problem, neck pain and neck stiffness.  Skin: Negative for itching and rash.  Neurological: Negative for dizziness, extremity weakness, gait problem, headaches, light-headedness and seizures.  Hematological: Negative for adenopathy. Does not bruise/bleed easily.  Psychiatric/Behavioral: Negative for confusion, depression and sleep disturbance. The patient is not nervous/anxious.     PHYSICAL EXAMINATION:  There were no vitals taken for this visit.  ECOG PERFORMANCE STATUS: {CHL ONC ECOG [id number]  Physical Exam  Constitutional: Oriented to person, place, and time and well-developed, well-nourished, and in no distress. No distress.  HENT:  Head: Normocephalic and atraumatic.  Mouth/Throat: Oropharynx is clear and moist. No oropharyngeal exudate.  Eyes: Conjunctivae are normal. Right eye exhibits no discharge. Left eye exhibits no discharge. No scleral icterus.  Neck: Normal range of motion. Neck supple.  Cardiovascular: Normal rate, regular rhythm, normal heart sounds and intact distal pulses.   Pulmonary/Chest: Effort normal and breath sounds normal. No respiratory  distress. No wheezes. No rales.  Abdominal: Soft. Bowel sounds are normal. Exhibits no distension and no mass. There is no tenderness.  Musculoskeletal: Normal range of motion. Exhibits no edema.  Lymphadenopathy:    No cervical adenopathy.  Neurological: Alert and oriented to person, place, and time. Exhibits normal muscle tone. Gait normal. Coordination normal.  Skin: Skin is warm and dry. No rash noted. Not diaphoretic. No erythema. No pallor.  Psychiatric: Mood, memory and judgment normal.  Vitals reviewed.  LABORATORY DATA: Lab Results  Component Value Date   WBC 4.7 08/06/2023  HGB 11.7 (L) 08/06/2023   HCT 36.5 08/06/2023   MCV 101.1 (H) 08/06/2023   PLT 208 08/06/2023      Chemistry      Component Value Date/Time   NA 138 08/06/2023 0827   NA 139 09/17/2019 1550   K 3.8 08/06/2023 0827   CL 103 08/06/2023 0827   CO2 28 08/06/2023 0827   BUN 16 08/06/2023 0827   BUN 9 09/17/2019 1550   CREATININE 0.67 08/06/2023 0827      Component Value Date/Time   CALCIUM 8.8 (L) 08/06/2023 0827   ALKPHOS 62 08/06/2023 0827   AST 14 (L) 08/06/2023 0827   ALT 12 08/06/2023 0827   BILITOT 0.4 08/06/2023 0827       RADIOGRAPHIC STUDIES:  MR Brain W Wo Contrast  Result Date: 08/06/2023 CLINICAL DATA:  Brain metastases, assess treatment response 3T SRS Protocol EXAM: MRI HEAD WITHOUT AND WITH CONTRAST TECHNIQUE: Multiplanar, multiecho pulse sequences of the brain and surrounding structures were obtained without and with intravenous contrast. CONTRAST:  9 ml V[employer]way COMPARISON:  Brain MR 05/01/23 FINDINGS: Brain: Negative for an acute infarct. No hemorrhage. No hydrocephalus. No extra-axial fluid collection. Is a background of mild chronic microvascular ischemic change. Postsurgical changes from a right occipital craniotomy with unchanged T2/FLAIR hyperintense signal abnormality surrounding the resection cavity. Smooth contrast enhancement along the right lateral aspect of the  resection cavity is likely unchanged from prior exam when accounting for differences in technique. Unchanged small contrast-enhancing lesion along the atrium of the left lateral ventricle (series 14, image 106). There is a subtle dural-based contrast-enhancing lesion along the left frontal convexity, which is likely vascular (series 15, image 140). Vascular: Normal flow voids. Skull and upper cervical spine: Normal marrow signal. Sinuses/Orbits: Small right mastoid effusion. No middle ear effusion. Paranasal sinuses are grossly clear. Orbits are unremarkable. Other: None. IMPRESSION: 1. Unchanged appearance of the right occipital resection cavity and the solitary enhancing lesion along the atrium of the left lateral ventricle. 2. Punctate dural based contrast enhancement along the left frontal convexity is likely vascular. Recommend attention on follow up. Electronically Signed   By: Lorenza Cambridge M.D.   On: 08/06/2023 11:10     ASSESSMENT/PLAN:  This is a very pleasant 82 year old Caucasian female with likely stage IV (T2a, N3, M1B) non-small cell lung cancer, adenocarcinoma presented with left upper lobe perihilar mass in addition to left hilar and mediastinal lymphadenopathy as well as left supraclavicular lymphadenopathy and a solitary brain metastasis diagnosed in September 2023. The molecular studies showed positive KRAS G12C mutation. Discussed this can be used in the second line setting in the future.   The patient underwent SRS and craniotomy under the care of Dr. Basilio Cairo and Dr. Jake Samples on 07/10/22. Dr. Jake Samples does not recommend any chemotherapy for at least 1 month from her surgery due to wound healing.    She is recently completed radiation to the chest under the care of Dr. Roselind Messier. The last day of radiation was scheduled for 08/23/22  The plan is to undergo systemic chemotherapy wit carboplatin for an AUC of 5, Alimta 500 mg/m, Keytruda 200 mg IV every 3 weeks. She is status post 14 cycles.   Starting from cycle #5, she started maintenance Alimta and Keytruda. Her dose of Alimta is reduced to 400 mg/m2.   Labs were reviewed. Recommend ***  We will see her back for labs and follow up in *** for evaluation and repeat blood work before undergoing cycle #15.  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 She we will continue to follow with the vein specialist for her leg edema   Skin infection ***  COPD ***   The patient was advised to call immediately if she has any concerning symptoms in the interval. The patient voices understanding of current disease status and treatment options and is in agreement with the current care plan. All questions were answered. The patient knows to call the clinic with any problems, questions or concerns. We can certainly see the patient much sooner if necessary   No orders of the defined types were placed in this encounter.    I spent {CHL ONC TIME VISIT - AOZHY:[id number]} counseling the patient face to face. The total time spent in the appointment was {CHL ONC TIME VISIT - XBMWU:[id number]}.  Renso Swett L Penne Rosenstock, PA-C 08/24/23

## 2023-08-26 ENCOUNTER — Other Ambulatory Visit: Payer: Medicare Other

## 2023-08-26 ENCOUNTER — Ambulatory Visit: Payer: Medicare Other | Admitting: Physician Assistant

## 2023-08-26 ENCOUNTER — Ambulatory Visit: Payer: Medicare Other

## 2023-08-27 ENCOUNTER — Inpatient Hospital Stay: Payer: Medicare Other | Attending: Internal Medicine | Admitting: Physician Assistant

## 2023-08-27 ENCOUNTER — Other Ambulatory Visit: Payer: Self-pay

## 2023-08-27 ENCOUNTER — Emergency Department (HOSPITAL_COMMUNITY): Payer: Medicare Other

## 2023-08-27 ENCOUNTER — Emergency Department (HOSPITAL_COMMUNITY)
Admission: EM | Admit: 2023-08-27 | Discharge: 2023-08-27 | Disposition: A | Discharge status: Home / Self Care | Payer: Medicare Other | Attending: Emergency Medicine | Admitting: Emergency Medicine

## 2023-08-27 ENCOUNTER — Encounter (HOSPITAL_COMMUNITY): Payer: Self-pay

## 2023-08-27 ENCOUNTER — Inpatient Hospital Stay: Payer: Medicare Other | Attending: Internal Medicine

## 2023-08-27 ENCOUNTER — Inpatient Hospital Stay: Payer: Medicare Other

## 2023-08-27 DIAGNOSIS — Z888 Allergy status to other drugs, medicaments and biological substances status: Secondary | ICD-10-CM | POA: Insufficient documentation

## 2023-08-27 DIAGNOSIS — Z9049 Acquired absence of other specified parts of digestive tract: Secondary | ICD-10-CM | POA: Insufficient documentation

## 2023-08-27 DIAGNOSIS — C7931 Secondary malignant neoplasm of brain: Secondary | ICD-10-CM | POA: Insufficient documentation

## 2023-08-27 DIAGNOSIS — F419 Anxiety disorder, unspecified: Secondary | ICD-10-CM | POA: Insufficient documentation

## 2023-08-27 DIAGNOSIS — Z7901 Long term (current) use of anticoagulants: Secondary | ICD-10-CM | POA: Insufficient documentation

## 2023-08-27 DIAGNOSIS — J4489 Other specified chronic obstructive pulmonary disease: Secondary | ICD-10-CM | POA: Diagnosis not present

## 2023-08-27 DIAGNOSIS — Z923 Personal history of irradiation: Secondary | ICD-10-CM | POA: Insufficient documentation

## 2023-08-27 DIAGNOSIS — C3412 Malignant neoplasm of upper lobe, left bronchus or lung: Secondary | ICD-10-CM | POA: Insufficient documentation

## 2023-08-27 DIAGNOSIS — Z86718 Personal history of other venous thrombosis and embolism: Secondary | ICD-10-CM | POA: Insufficient documentation

## 2023-08-27 DIAGNOSIS — R0602 Shortness of breath: Secondary | ICD-10-CM | POA: Diagnosis present

## 2023-08-27 DIAGNOSIS — I6782 Cerebral ischemia: Secondary | ICD-10-CM | POA: Insufficient documentation

## 2023-08-27 DIAGNOSIS — Z85118 Personal history of other malignant neoplasm of bronchus and lung: Secondary | ICD-10-CM | POA: Insufficient documentation

## 2023-08-27 DIAGNOSIS — Z79899 Other long term (current) drug therapy: Secondary | ICD-10-CM | POA: Insufficient documentation

## 2023-08-27 DIAGNOSIS — Z5112 Encounter for antineoplastic immunotherapy: Secondary | ICD-10-CM

## 2023-08-27 DIAGNOSIS — C3490 Malignant neoplasm of unspecified part of unspecified bronchus or lung: Secondary | ICD-10-CM | POA: Diagnosis not present

## 2023-08-27 DIAGNOSIS — J441 Chronic obstructive pulmonary disease with (acute) exacerbation: Secondary | ICD-10-CM | POA: Insufficient documentation

## 2023-08-27 DIAGNOSIS — Z87442 Personal history of urinary calculi: Secondary | ICD-10-CM | POA: Insufficient documentation

## 2023-08-27 DIAGNOSIS — Z95828 Presence of other vascular implants and grafts: Secondary | ICD-10-CM

## 2023-08-27 DIAGNOSIS — M7989 Other specified soft tissue disorders: Secondary | ICD-10-CM | POA: Insufficient documentation

## 2023-08-27 DIAGNOSIS — Z9089 Acquired absence of other organs: Secondary | ICD-10-CM | POA: Insufficient documentation

## 2023-08-27 LAB — CBC WITH DIFFERENTIAL (CANCER CENTER ONLY)
Abs Immature Granulocytes: 0 10*3/uL (ref 0.00–0.07)
Basophils Absolute: 0 10*3/uL (ref 0.0–0.1)
Basophils Relative: 1 %
Eosinophils Absolute: 0.1 10*3/uL (ref 0.0–0.5)
Eosinophils Relative: 1 %
HCT: 38.1 % (ref 36.0–46.0)
Hemoglobin: 12.3 g/dL (ref 12.0–15.0)
Immature Granulocytes: 0 %
Lymphocytes Relative: 8 %
Lymphs Abs: 0.4 10*3/uL — ABNORMAL LOW (ref 0.7–4.0)
MCH: 32.3 pg (ref 26.0–34.0)
MCHC: 32.3 g/dL (ref 30.0–36.0)
MCV: 100 fL (ref 80.0–100.0)
Monocytes Absolute: 0.3 10*3/uL (ref 0.1–1.0)
Monocytes Relative: 5 %
Neutro Abs: 4.1 10*3/uL (ref 1.7–7.7)
Neutrophils Relative %: 85 %
Platelet Count: 190 10*3/uL (ref 150–400)
RBC: 3.81 MIL/uL — ABNORMAL LOW (ref 3.87–5.11)
RDW: 13.3 % (ref 11.5–15.5)
WBC Count: 4.8 10*3/uL (ref 4.0–10.5)
nRBC: 0 % (ref 0.0–0.2)

## 2023-08-27 LAB — CMP (CANCER CENTER ONLY)
ALT: 10 U/L (ref 0–44)
AST: 13 U/L — ABNORMAL LOW (ref 15–41)
Albumin: 3.9 g/dL (ref 3.5–5.0)
Alkaline Phosphatase: 66 U/L (ref 38–126)
Anion gap: 5 (ref 5–15)
BUN: 14 mg/dL (ref 8–23)
CO2: 29 mmol/L (ref 22–32)
Calcium: 8.9 mg/dL (ref 8.9–10.3)
Chloride: 103 mmol/L (ref 98–111)
Creatinine: 0.66 mg/dL (ref 0.44–1.00)
GFR, Estimated: >60 mL/min (ref >60)
Glucose, Bld: 148 mg/dL — ABNORMAL HIGH (ref 70–99)
Potassium: 4.3 mmol/L (ref 3.5–5.1)
Sodium: 137 mmol/L (ref 135–145)
Total Bilirubin: 0.5 mg/dL (ref <1.2)
Total Protein: 6.4 g/dL — ABNORMAL LOW (ref 6.5–8.1)

## 2023-08-27 LAB — TROPONIN I (HIGH SENSITIVITY): Troponin I (High Sensitivity): 6 ng/L (ref <18)

## 2023-08-27 LAB — BRAIN NATRIURETIC PEPTIDE: B Natriuretic Peptide: 22.2 pg/mL (ref 0.0–100.0)

## 2023-08-27 LAB — TSH: TSH: 4.514 u[IU]/mL — ABNORMAL HIGH (ref 0.350–4.500)

## 2023-08-27 MED ORDER — HEPARIN SOD (PORK) LOCK FLUSH 100 UNIT/ML IV SOLN
500.0000 [IU] | Freq: Once | INTRAVENOUS | Status: AC
  Administered 2023-08-27: 500 [IU]
  Filled 2023-08-27: qty 5

## 2023-08-27 MED ORDER — SODIUM CHLORIDE 0.9% FLUSH
10.0000 mL | Freq: Once | INTRAVENOUS | Status: AC
  Administered 2023-08-27: 10 mL

## 2023-08-27 MED ORDER — IPRATROPIUM-ALBUTEROL 0.5-2.5 (3) MG/3ML IN SOLN: 3.0000 mL | Freq: Four times a day (QID) | RESPIRATORY_TRACT | 0 refills | Status: AC | PRN

## 2023-08-27 MED ORDER — PREDNISONE 10 MG PO TABS
20.0000 mg | ORAL_TABLET | Freq: Every day | ORAL | 0 refills | Status: AC
Start: 2023-08-27 — End: ?

## 2023-08-27 MED ORDER — IPRATROPIUM-ALBUTEROL 0.5-2.5 (3) MG/3ML IN SOLN
3.0000 mL | RESPIRATORY_TRACT | Status: AC
  Administered 2023-08-27 (×2): 3 mL via RESPIRATORY_TRACT
  Filled 2023-08-27 (×2): qty 3

## 2023-08-27 MED ORDER — IOHEXOL 350 MG/ML SOLN
75.0000 mL | Freq: Once | INTRAVENOUS | Status: AC | PRN
  Administered 2023-08-27: 75 mL via INTRAVENOUS

## 2023-08-27 MED ORDER — IPRATROPIUM-ALBUTEROL 0.5-2.5 (3) MG/3ML IN SOLN
3.0000 mL | Freq: Once | RESPIRATORY_TRACT | Status: AC
  Administered 2023-08-27: 3 mL via RESPIRATORY_TRACT
  Filled 2023-08-27: qty 3

## 2023-08-27 MED ORDER — PREDNISONE 50 MG PO TABS
50.0000 mg | ORAL_TABLET | Freq: Once | ORAL | Status: AC
  Administered 2023-08-27: 50 mg via ORAL
  Filled 2023-08-27: qty 1

## 2023-08-27 MED ORDER — MAGNESIUM SULFATE 2 GM/50ML IV SOLN
2.0000 g | Freq: Once | INTRAVENOUS | Status: AC
  Administered 2023-08-27: 2 g via INTRAVENOUS
  Filled 2023-08-27: qty 50

## 2023-08-27 NOTE — ED Triage Notes (Addendum)
Pt arrives from cancer center for sob and chest tightness x1 week.  Patient reports currently on tapering dose of prednisone and took 10mg  this am and just finished abx for hip infection.  Last chemo in September for stage 4 non small cell lung cancer.

## 2023-08-27 NOTE — Progress Notes (Signed)
Pt given neb tx that was ordered.  Pt placed on 1L [place] after neb tx.  Pt wears O2 at home as needed.

## 2023-08-27 NOTE — ED Provider Notes (Signed)
EMERGENCY DEPARTMENT AT [facility] Provider Note   CSN: [id number] Arrival date & time: 08/27/23  1213     History  Chief Complaint  Patient presents with   Shortness of Breath    [place] is a 82 y.o. female past medical history significant for COPD and metastatic lung cancer presents today for shortness of breath and chest tightness x 1 week.  Patient just finished antibiotics for hip infection and is currently on a tapering dose of prednisone.  Her last chemo was in September.  Patient endorses mildly productive cough.  Patient denies fever, chills, nausea, vomiting, diarrhea, weakness, or headache.  Patient is on blood thinners.   Shortness of Breath Associated symptoms: cough        Home Medications Prior to Admission medications   Medication Sig Start Date End Date Taking? Authorizing Provider  ipratropium-albuterol (DUONEB) 0.5-2.5 (3) MG/3ML SOLN Take 3 mLs by nebulization every 6 (six) hours as needed. 08/27/23 09/26/23 Yes Dolphus Jenny, PA-C  predniSONE (DELTASONE) 10 MG tablet Take 2 tablets (20 mg total) by mouth daily. 08/27/23  Yes Dolphus Jenny, PA-C  albuterol (VENTOLIN HFA) 108 (90 Base) MCG/ACT inhaler Inhale 2 puffs into the lungs every 6 (six) hours as needed for wheezing or shortness of breath. 06/29/23   Jaci Standard, MD  ALPRAZolam Prudy Feeler) 0.25 MG tablet Take 0.25 mg by mouth at bedtime as needed for anxiety.    [provider]  amoxicillin-clavulanate (AUGMENTIN) 875-125 MG tablet Take 1 tablet by mouth 2 (two) times daily. Patient not taking: Reported on 08/27/2023 08/12/23   Heilingoetter, Cassandra L, PA-C  Budeson-Glycopyrrol-Formoterol (BREZTRI AEROSPHERE) 160-9-4.8 MCG/ACT AERO INHALE 2 PUFFS BY MOUTH EVERY MORNING AND EVERY NIGHT AT BEDTIME Patient taking differently: Inhale 2 puffs into the lungs in the morning and at bedtime. 05/09/22   Nyoka Cowden, MD  ELIQUIS 5 MG TABS tablet Take 1 tablet (5 mg  total) by mouth 2 (two) times daily. 04/10/23   Heilingoetter, Cassandra L, PA-C  folic acid (FOLVITE) 1 MG tablet TAKE ONE TABLET BY MOUTH EVERY DAY 08/12/23   Heilingoetter, Cassandra L, PA-C  furosemide (LASIX) 20 MG tablet 1 tablet p.o. daily as needed for increased leg swelling. 02/05/23   Si Gaul, MD  gabapentin (NEURONTIN) 100 MG capsule Take 1 capsule (100 mg total) by mouth 2 (two) times daily. Patient not taking: Reported on 08/27/2023 11/05/22   Heilingoetter, Cassandra L, PA-C  hydrOXYzine (ATARAX) 10 MG tablet Take 1 tablet (10 mg total) by mouth 3 (three) times daily as needed. 03/25/23   Heilingoetter, Cassandra L, PA-C  levocetirizine (XYZAL) 5 MG tablet Take 5 mg by mouth at bedtime as needed (for seasonal allergies- if not taking generic Claritin).    [provider]  lidocaine-prilocaine (EMLA) cream Apply 1 Application topically as needed. 03/25/23   Heilingoetter, Cassandra L, PA-C  LORazepam (ATIVAN) 1 MG tablet Take 1 tablet (1 mg total) by mouth as needed. 30 min Prior to MRI or brain radiation procedure 04/25/23   Lonie Peak, MD  mupirocin ointment (BACTROBAN) 2 % Apply 1 Application topically daily. 08/06/23   Heilingoetter, Cassandra L, PA-C  ondansetron (ZOFRAN) 8 MG tablet Take 1 tablet (8 mg total) by mouth every 8 (eight) hours as needed for nausea or vomiting. 07/22/22   Heilingoetter, Cassandra L, PA-C  oxyCODONE (OXY IR/ROXICODONE) 5 MG immediate release tablet Take 1 tablet (5 mg total) by mouth every 4 (four) hours as needed for  severe pain. 04/09/23   Jeannie Fend, PA-C  pantoprazole (PROTONIX) 20 MG tablet Take 20 mg by mouth every evening. 07/19/22   [provider]  sulfamethoxazole-trimethoprim (BACTRIM DS) 800-160 MG tablet Take 1 tablet by mouth 2 (two) times daily. Patient not taking: Reported on 08/27/2023 08/06/23   Heilingoetter, Cassandra L, PA-C  telmisartan (MICARDIS) 20 MG tablet Take 1 tablet (20 mg total) by mouth daily.  08/18/23   Denyce Robert, NP  traMADol (ULTRAM) 50 MG tablet Take 2 tablets (100 mg total) by mouth every 6 (six) hours as needed for severe pain. Patient taking differently: Take 50 mg by mouth every 6 (six) hours as needed for severe pain (pain score 7-10). 07/13/22   Dawley, Troy C, DO  Zinc Acetate, Oral, (ZINC ACETATE PO) Take 1 tablet by mouth daily. Received from [employer], Historical, MD      Allergies    Compazine [prochlorperazine], Norvasc [amlodipine], and Trelegy ellipta [fluticasone-umeclidin-vilant]    Review of Systems   Review of Systems  Respiratory:  Positive for cough and shortness of breath.     Physical Exam Updated Vital Signs BP (!) 118/48   Pulse (!) 107   Temp 98.5 F (36.9 C)   Resp (!) 21   Wt 88.5 kg   SpO2 92%   BMI 31.47 kg/m  Physical Exam Vitals and nursing note reviewed.  Constitutional:      General: She is not in acute distress.    Appearance: She is well-developed.  HENT:     Head: Normocephalic and atraumatic.  Eyes:     Extraocular Movements: Extraocular movements intact.     Conjunctiva/sclera: Conjunctivae normal.  Cardiovascular:     Rate and Rhythm: Normal rate and regular rhythm.     Heart sounds: No murmur heard.    Comments: Mildly tachycardic Pulmonary:     Effort: Pulmonary effort is normal. Tachypnea present. No respiratory distress.     Breath sounds: Examination of the right-lower field reveals decreased breath sounds. Examination of the left-lower field reveals decreased breath sounds. Decreased breath sounds present. No wheezing.     Comments: Moderately dyspneic when speaking Abdominal:     Palpations: Abdomen is soft.     Tenderness: There is no abdominal tenderness.  Musculoskeletal:        General: No swelling.     Cervical back: Neck supple.     Right lower leg: No edema.     Left lower leg: No edema.  Skin:    General: Skin is warm and dry.     Capillary Refill: Capillary refill takes less  than 2 seconds.  Neurological:     General: No focal deficit present.     Mental Status: She is alert.  Psychiatric:        Mood and Affect: Mood normal.     ED Results / Procedures / Treatments   Labs (all labs ordered are listed, but only abnormal results are displayed) Labs Reviewed  BRAIN NATRIURETIC PEPTIDE  TROPONIN I (HIGH SENSITIVITY)    EKG EKG Interpretation Date/Time:  Wednesday August 27 2023 13:28:47 EST Ventricular Rate:  93 PR Interval:  150 QRS Duration:  96 QT Interval:  361 QTC Calculation: 449 R Axis:   207  Text Interpretation: Sinus rhythm Probable lateral infarct, age indeterminate No significant change since last tracing Confirmed by Melene Plan [phone]) on 08/27/2023 3:25:34 PM  Radiology CT Angio Chest PE W and/or Wo Contrast  Result Date:  08/27/2023 CLINICAL DATA:  Short of breath. Concern pulmonary embolism. [facility] patient. Chemotherapy ongoing. Non-small cell lung cancer. * Tracking Code: BO *. EXAM: CT ANGIOGRAPHY CHEST WITH CONTRAST TECHNIQUE: Multidetector CT imaging of the chest was performed using the standard protocol during bolus administration of intravenous contrast. Multiplanar CT image reconstructions and MIPs were obtained to evaluate the vascular anatomy. RADIATION DOSE REDUCTION: This exam was performed according to the departmental dose-optimization program which includes automated exposure control, adjustment of the mA and/or kV according to patient size and/or use of iterative reconstruction technique. CONTRAST:  75mL OMNIPAQUE IOHEXOL 350 MG/ML SOLN COMPARISON:  None Available. FINDINGS: Cardiovascular: No filling defects within the pulmonary arteries to suggest acute pulmonary embolism. Mediastinum/Nodes: No axillary or supraclavicular adenopathy. No mediastinal or hilar adenopathy. No pericardial fluid. Esophagus normal. Stable cystic enlargement of the LEFT lobe of thyroid gland unchanged from prior. Lungs/Pleura: Moderate  layering LEFT pleural effusion not changed from prior. Patchy consolidation in the LEFT upper lobe is not changed from CT 06/23/2023. No new nodularity. No pneumothorax. Fiducial marker in the RIGHT lung with surrounding mild ground-glass density not changed from prior. No pneumonia. Upper Abdomen: Limited view of the liver, kidneys, pancreas are unremarkable. Normal adrenal glands. Musculoskeletal: No aggressive osseous lesion. Review of the MIP images confirms the above findings. IMPRESSION: 1. No evidence acute pulmonary embolism. 2. Patchy consolidation LEFT upper lobe suggest post radiation change. 3. Moderate LEFT pleural effusion unchanged from prior. 4. No evidence of pneumonia.  No pneumothorax. Electronically Signed   By: Genevive Bi M.D.   On: 08/27/2023 18:38   DG Chest 2 View  Result Date: 08/27/2023 CLINICAL DATA:  Shortness of breath. History of stage IV non-small cell lung cancer. EXAM: CHEST - 2 VIEW COMPARISON:  Chest CT 06/23/2023 FINDINGS: Right chest port in place. Chronic volume loss in the left hemithorax. Ill-defined opacity in the left upper lung zone, similar to prior CT. Small left pleural effusion. Right lung pulmonary nodules on CT are not well-defined. No pneumothorax. The heart is normal in size. Thoracic spondylosis. Bones diffusely under mineralized. IMPRESSION: 1. Chronic volume loss in the left hemithorax. Ill-defined opacity in the left upper lung zone, similar to prior CT likely reflecting treatment related change. 2. Small left pleural effusion. Electronically Signed   By: Narda Rutherford M.D.   On: 08/27/2023 16:52    Procedures Procedures    Medications Ordered in ED Medications  heparin lock flush 100 unit/mL (has no administration in time range)  ipratropium-albuterol (DUONEB) 0.5-2.5 (3) MG/3ML nebulizer solution 3 mL (3 mLs Nebulization Given 08/27/23 1353)  ipratropium-albuterol (DUONEB) 0.5-2.5 (3) MG/3ML nebulizer solution 3 mL (3 mLs Nebulization  Given 08/27/23 1457)  predniSONE (DELTASONE) tablet 50 mg (50 mg Oral Given 08/27/23 1438)  magnesium sulfate IVPB 2 g 50 mL (0 g Intravenous Stopped 08/27/23 1457)  iohexol (OMNIPAQUE) 350 MG/ML injection 75 mL (75 mLs Intravenous Contrast Given 08/27/23 1541)    ED Course/ Medical Decision Making/ A&P                                 Medical Decision Making Amount and/or Complexity of Data Reviewed Labs: ordered. Radiology: ordered.  Risk Prescription drug management.   This patient presents to the ED with chief complaint(s) of shortness of breath with pertinent past medical history of metastatic lung cancer and COPD which further complicates the presenting complaint. The complaint involves an extensive differential diagnosis  and also carries with it a high risk of complications and morbidity.    The differential diagnosis includes COPD exacerbation, viral illness, pneumonia, metastatic lung cancer  Additional history obtained: Records reviewed oncology progress notes  ED Course and Reassessment:   Independent labs interpretation:  The following labs were independently interpreted:  BMP: Mildly decreased AST and total protein BNP: 22.2 CBC: Mildly decreased RBCs EKG: Sinus rhythm Troponin: 6  Independent visualization of imaging: - I independently visualized the following imaging with scope of interpretation limited to determining acute life threatening conditions related to emergency care: Chest x-ray, which revealed small left pleural effusion.  CTA which showed moderate left pleural effusion, unchanged from previous imaging.  Consultation: - Consulted or discussed management/test interpretation w/ external professional: None  Consideration for admission or further workup: Considered for admission or further workup however utilizing shared decision making and discussing options with patient patient states that she feels well enough to go home and has oxygen if needed at  home.  Patient given strict return precautions, all patient's questions were answered to her satisfaction.        Final Clinical Impression(s) / ED Diagnoses Final diagnoses:  COPD exacerbation (HCC)    Rx / DC Orders ED Discharge Orders          Ordered    ipratropium-albuterol (DUONEB) 0.5-2.5 (3) MG/3ML SOLN  Every 6 hours PRN        08/27/23 1910    predniSONE (DELTASONE) 10 MG tablet  Daily        08/27/23 1910              Dolphus Jenny, PA-C 08/27/23 1912    Melene Plan, DO 08/28/23 [phone]

## 2023-08-27 NOTE — Discharge Instructions (Addendum)
Today you were seen for a COPD exacerbation.  Please pick up your prescriptions and take as prescribed.  Thank you for letting us treat you today. After reviewing your labs and imaging, I feel you are safe to go home. Please follow up with your PCP in the next several days and provide them with your records from this visit. Return to the Emergency Room if pain becomes severe or symptoms worsen.

## 2023-08-28 LAB — T4: T4, Total: 7.8 ug/dL (ref 4.5–12.0)

## 2023-09-01 ENCOUNTER — Telehealth: Payer: Self-pay | Admitting: Physician Assistant

## 2023-09-01 NOTE — Telephone Encounter (Signed)
Patient is aware of scheduled appointment times/dates for follow up appointment

## 2023-09-02 ENCOUNTER — Other Ambulatory Visit: Payer: Self-pay | Admitting: Physician Assistant

## 2023-09-02 DIAGNOSIS — I8290 Acute embolism and thrombosis of unspecified vein: Secondary | ICD-10-CM

## 2023-09-09 ENCOUNTER — Encounter: Payer: Self-pay | Admitting: Internal Medicine

## 2023-09-10 NOTE — Progress Notes (Signed)
[facility] OFFICE PROGRESS NOTE  [name], NP [phone] [address] [place] [address]  DIAGNOSIS: Stage IV (T2 a, N3, M1b) non-small cell lung cancer, adenocarcinoma presented with left upper lobe perihilar mass in addition to left hilar and mediastinal lymphadenopathy as well as left supraclavicular lymphadenopathy with brain metastasis diagnosed in September 2023.   [name]   Detected Alteration(s) / Biomarker(s)          Associated FDA-approved therapies  Clinical Trial Availability          % cfDNA or Amplification   KRAS G12C approved by FDA Adagrasib, Sotorasib Yes    1.7%   TP53 R249S None Yes          1.5%  PRIOR THERAPY: 1) SRS the the metastatic brain lesion under the care of Dr. Basilio Cairo 2) Craniotomy on 07/10/22 under the care of Dr. Jake Samples 3) radiation to the chest under care of Dr. Roselind Messier.  Last dose on 08/23/2022  CURRENT THERAPY: Systemic chemotherapy with carboplatin for an AUC of 5, to 500 mg/m2, and Keytruda 200 mg IV every 3 weeks.  First dose expected on October 08, 2022. Starting from cycle #5 she will be on maintenance treatment with Alimta and Keytruda every 3 weeks. Status post 14 cycles. Will reduce her dose of alimta to 400 mg/m2 secondary to intolerance   INTERVAL HISTORY: [place] 82 y.o. female returns to the clinic today for a follow-up visit.  The patient was last seen in clinic on 08/27/2023.  At that point in time, the patient was having significant worsening shortness of breath.  She had significant shortness of breath despite being on antibiotics and prednisone. She was sent to the ER and was treated for a COPD exacerbation.  She had a CT angio that was stable. Of note, this did show stable moderate left pleural effusion.  She has been struggling with frequent COPD exacerbations.  She is followed by [facility] pulmonary [facility]. She was last seen by them upon discharge on 09/02/23.  They reached out to adapt to change the CPAP water  pressure, spacers for Breztri and albuterol, and sent prescription for Augmentin and prednisone on hand if needed.  They are going to follow-up with her in 3 months. She is currently on 10 mg of prednisone.   Since being seen last her breathing is a lot better. It is about near her baseline.  When I saw the patient for the 08/06/23 appointment,  she had a subcutaneous skin lesion that had been there for many decades. She previously was told she needed to see a surgeon to have it removed. This started draining. She was sent in antibiotics and referred to wound care. This eventually stopped draining and she has not needed to see wound care.   She also had seen cardiology in November who ordered echocardiogram.   Overall, the patient is feeling fair today.  She denies any fever, chills, night sweats, or unexplained weight loss. She only has a cough if she is seated for long periods and has phlegm to cough up. She denies any chest pain or hemoptysis. She may have intermittent band like sensation around her chest but none at this time. She has antiemetics but denies significant nausea at this time. She also struggles with constipation and uses magnesium, laxatives, and stool softeners.  Denies any rashes or skin changes except she does report itching occasionally without rash. She uses xyzal at night. She is compliant with her  blood thinner for her history of chronic DVT. She is here for evaluation and repeat blood work before undergoing cycle #15.     MEDICAL HISTORY: Past Medical History:  Diagnosis Date   Anxiety    "had panic attacks years ago"   Asthma    COPD (chronic obstructive pulmonary disease) (HCC)    Goiter 2022   History of hiatal hernia    "dx in college, never bothered me"   History of kidney stones 2012   History of radiation therapy    Left Lung-07/10/22-08/23/22- Dr. Antony Blackbird   Hypertension    Lung cancer [facility])    Sleep apnea     ALLERGIES:  is allergic to compazine  [prochlorperazine], norvasc [amlodipine], and trelegy ellipta [fluticasone-umeclidin-vilant].  MEDICATIONS:  Current Outpatient Medications  Medication Sig Dispense Refill   albuterol (VENTOLIN HFA) 108 (90 Base) MCG/ACT inhaler Inhale 2 puffs into the lungs every 6 (six) hours as needed for wheezing or shortness of breath. 18 g 0   ALPRAZolam (XANAX) 0.25 MG tablet Take 0.25 mg by mouth at bedtime as needed for anxiety.     amoxicillin-clavulanate (AUGMENTIN) 875-125 MG tablet Take 1 tablet by mouth 2 (two) times daily. (Patient not taking: Reported on 08/27/2023) 14 tablet 0   Budeson-Glycopyrrol-Formoterol (BREZTRI AEROSPHERE) 160-9-4.8 MCG/ACT AERO INHALE 2 PUFFS BY MOUTH EVERY MORNING AND EVERY NIGHT AT BEDTIME (Patient taking differently: Inhale 2 puffs into the lungs in the morning and at bedtime.) 10.7 g 5   ELIQUIS 5 MG TABS tablet Take 1 tablet (5 mg total) by mouth 2 (two) times daily. 60 tablet 3   folic acid (FOLVITE) 1 MG tablet TAKE ONE TABLET BY MOUTH EVERY DAY 30 tablet 2   furosemide (LASIX) 20 MG tablet 1 tablet p.o. daily as needed for increased leg swelling. 20 tablet 0   gabapentin (NEURONTIN) 100 MG capsule Take 1 capsule (100 mg total) by mouth 2 (two) times daily. (Patient not taking: Reported on 08/27/2023) 90 capsule 2   hydrOXYzine (ATARAX) 10 MG tablet Take 1 tablet (10 mg total) by mouth 3 (three) times daily as needed. 60 tablet 2   ipratropium-albuterol (DUONEB) 0.5-2.5 (3) MG/3ML SOLN Take 3 mLs by nebulization every 6 (six) hours as needed. 360 mL 0   levocetirizine (XYZAL) 5 MG tablet Take 5 mg by mouth at bedtime as needed (for seasonal allergies- if not taking generic Claritin).     lidocaine-prilocaine (EMLA) cream Apply 1 Application topically as needed. 30 g 2   LORazepam (ATIVAN) 1 MG tablet Take 1 tablet (1 mg total) by mouth as needed. 30 min Prior to MRI or brain radiation procedure 5 tablet 0   mupirocin ointment (BACTROBAN) 2 % Apply 1 Application  topically daily. 22 g 0   ondansetron (ZOFRAN) 8 MG tablet Take 1 tablet (8 mg total) by mouth every 8 (eight) hours as needed for nausea or vomiting. 30 tablet 2   oxyCODONE (OXY IR/ROXICODONE) 5 MG immediate release tablet Take 1 tablet (5 mg total) by mouth every 4 (four) hours as needed for severe pain. 12 tablet 0   pantoprazole (PROTONIX) 20 MG tablet Take 20 mg by mouth every evening.     predniSONE (DELTASONE) 10 MG tablet Take 2 tablets (20 mg total) by mouth daily. 20 tablet 0   sulfamethoxazole-trimethoprim (BACTRIM DS) 800-160 MG tablet Take 1 tablet by mouth 2 (two) times daily. (Patient not taking: Reported on 08/27/2023) 20 tablet 0   telmisartan (MICARDIS) 20 MG tablet Take  1 tablet (20 mg total) by mouth daily. 15 tablet 0   traMADol (ULTRAM) 50 MG tablet Take 2 tablets (100 mg total) by mouth every 6 (six) hours as needed for severe pain. (Patient taking differently: Take 50 mg by mouth every 6 (six) hours as needed for severe pain (pain score 7-10).) 30 tablet 0   Zinc Acetate, Oral, (ZINC ACETATE PO) Take 1 tablet by mouth daily. Received from herbalist     No current facility-administered medications for this visit.    SURGICAL HISTORY:  Past Surgical History:  Procedure Laterality Date   APPENDECTOMY     APPLICATION OF CRANIAL NAVIGATION N/A 07/11/2022   Procedure: APPLICATION OF CRANIAL NAVIGATION;  Surgeon: Dawley, Alan Mulder, DO;  Location: [facility] OR;  Service: Neurosurgery;  Laterality: N/A;   BRONCHIAL BIOPSY  06/12/2021   Procedure: BRONCHIAL BIOPSIES;  Surgeon: Josephine Igo, DO;  Location: [facility] ENDOS[facility];  Service: Pulmonary;;   BRONCHIAL BRUSHINGS  06/12/2021   Procedure: BRONCHIAL BRUSHINGS;  Surgeon: Josephine Igo, DO;  Location: [facility];  Service: Pulmonary;;   BRONCHIAL WASHINGS  06/12/2021   Procedure: BRONCHIAL WASHINGS;  Surgeon: Josephine Igo, DO;  Location: [facility];  Service: Pulmonary;;   CHOLECYSTECTOMY     COLON RESECTION     CRANIOTOMY  N/A 07/11/2022   Procedure: Craniotomy for resection of tumor;  Surgeon: Bethann Goo, DO;  Location: [facility] OR;  Service: Neurosurgery;  Laterality: N/A;  RM 19   FIDUCIAL MARKER PLACEMENT  06/12/2021   Procedure: FIDUCIAL MARKER PLACEMENT;  Surgeon: Josephine Igo, DO;  Location: [facility];  Service: Pulmonary;;   hernia     x 2   IR IMAGING GUIDED PORT INSERTION  10/11/2022   TONSILLECTOMY     VIDEO BRONCHOSCOPY WITH ENDOBRONCHIAL NAVIGATION Bilateral 06/12/2021   Procedure: VIDEO BRONCHOSCOPY WITH ENDOBRONCHIAL NAVIGATION;  Surgeon: Josephine Igo, DO;  Location: [facility];  Service: Pulmonary;  Laterality: Bilateral;  ION   VIDEO BRONCHOSCOPY WITH RADIAL ENDOBRONCHIAL ULTRASOUND  06/12/2021   Procedure: RADIAL ENDOBRONCHIAL ULTRASOUND;  Surgeon: Josephine Igo, DO;  Location: [facility];  Service: Pulmonary;;    REVIEW OF SYSTEMS:   Review of Systems  Constitutional: Positive for stable fatigue. Negative for appetite change, chills,  fever and unexpected weight change.  HENT: Negative for mouth sores, nosebleeds, sore throat and trouble swallowing.   Eyes: Negative for eye problems and icterus.  Respiratory: Positive for baseline dyspnea on exertion. May get mild intermittent cough but not regular. Negative for hemoptysis and wheezing.   Cardiovascular: Negative for chest pain and leg swelling.  Gastrointestinal: Negative for abdominal pain, constipation, diarrhea, nausea and vomiting.  Genitourinary: Negative for bladder incontinence, difficulty urinating, dysuria, frequency and hematuria.   Musculoskeletal: Negative for back pain, gait problem, neck pain and neck stiffness.  Skin: Positive for occasional itching. Negative for rash.  Neurological: Negative for dizziness, extremity weakness, gait problem, headaches, light-headedness and seizures.  Hematological: Negative for adenopathy. Does not bruise/bleed easily.  Psychiatric/Behavioral: Negative for confusion, depression  and sleep disturbance. The patient is not nervous/anxious.     PHYSICAL EXAMINATION:  Blood pressure (!) 148/60, pulse 88, temperature 98 F (36.7 C), temperature source Temporal, resp. rate 15, weight 196 lb 9.6 oz (89.2 kg), SpO2 96%.  ECOG PERFORMANCE STATUS: 1-2  Physical Exam  Constitutional: Oriented to person, place, and time and well-developed, well-nourished. HENT:  Head: Normocephalic and atraumatic.  Mouth/Throat: Oropharynx is clear and moist. No oropharyngeal exudate.  Eyes: Conjunctivae are normal.  Right eye exhibits no discharge. Left eye exhibits no discharge. No scleral icterus.  Neck: Normal range of motion. Neck supple.  Cardiovascular: Normal rate, regular rhythm, normal heart sounds and intact distal pulses.   Pulmonary/Chest: Normal effort. Quiet breath sounds bilaterally. No wheezes. No rales.  Abdominal: Soft. Bowel sounds are normal. Exhibits no distension and no mass. There is no tenderness.  Musculoskeletal: Normal range of motion. Exhibits no edema.  Lymphadenopathy:    No cervical adenopathy.  Neurological: Alert and oriented to person, place, and time. Exhibits also wasting.  Ambulates with a walker.  Skin: Skin is warm and dry. No rash noted. Not diaphoretic. No erythema. No pallor.  Psychiatric: Mood, memory and judgment normal.  Vitals reviewed.  LABORATORY DATA: Lab Results  Component Value Date   WBC 5.6 09/12/2023   HGB 12.6 09/12/2023   HCT 38.1 09/12/2023   MCV 98.7 09/12/2023   PLT 201 09/12/2023      Chemistry      Component Value Date/Time   NA 137 08/27/2023 1058   NA 139 09/17/2019 1550   K 4.3 08/27/2023 1058   CL 103 08/27/2023 1058   CO2 29 08/27/2023 1058   BUN 14 08/27/2023 1058   BUN 9 09/17/2019 1550   CREATININE 0.66 08/27/2023 1058      Component Value Date/Time   CALCIUM 8.9 08/27/2023 1058   ALKPHOS 66 08/27/2023 1058   AST 13 (L) 08/27/2023 1058   ALT 10 08/27/2023 1058   BILITOT 0.5 08/27/2023 1058        RADIOGRAPHIC STUDIES:  CT Angio Chest PE W and/or Wo Contrast  Result Date: 08/27/2023 CLINICAL DATA:  Short of breath. Concern pulmonary embolism. [facility] Center patient. Chemotherapy ongoing. Non-small cell lung cancer. * Tracking Code: BO *. EXAM: CT ANGIOGRAPHY CHEST WITH CONTRAST TECHNIQUE: Multidetector CT imaging of the chest was performed using the standard protocol during bolus administration of intravenous contrast. Multiplanar CT image reconstructions and MIPs were obtained to evaluate the vascular anatomy. RADIATION DOSE REDUCTION: This exam was performed according to the departmental dose-optimization program which includes automated exposure control, adjustment of the mA and/or kV according to patient size and/or use of iterative reconstruction technique. CONTRAST:  75mL OMNIPAQUE IOHEXOL 350 MG/ML SOLN COMPARISON:  None Available. FINDINGS: Cardiovascular: No filling defects within the pulmonary arteries to suggest acute pulmonary embolism. Mediastinum/Nodes: No axillary or supraclavicular adenopathy. No mediastinal or hilar adenopathy. No pericardial fluid. Esophagus normal. Stable cystic enlargement of the LEFT lobe of thyroid gland unchanged from prior. Lungs/Pleura: Moderate layering LEFT pleural effusion not changed from prior. Patchy consolidation in the LEFT upper lobe is not changed from CT 06/23/2023. No new nodularity. No pneumothorax. Fiducial marker in the RIGHT lung with surrounding mild ground-glass density not changed from prior. No pneumonia. Upper Abdomen: Limited view of the liver, kidneys, pancreas are unremarkable. Normal adrenal glands. Musculoskeletal: No aggressive osseous lesion. Review of the MIP images confirms the above findings. IMPRESSION: 1. No evidence acute pulmonary embolism. 2. Patchy consolidation LEFT upper lobe suggest post radiation change. 3. Moderate LEFT pleural effusion unchanged from prior. 4. No evidence of pneumonia.  No pneumothorax.  Electronically Signed   By: Genevive Bi M.D.   On: 08/27/2023 18:38   DG Chest 2 View  Result Date: 08/27/2023 CLINICAL DATA:  Shortness of breath. History of stage IV non-small cell lung cancer. EXAM: CHEST - 2 VIEW COMPARISON:  Chest CT 06/23/2023 FINDINGS: Right chest port in place. Chronic volume loss in the left hemithorax.  Ill-defined opacity in the left upper lung zone, similar to prior CT. Small left pleural effusion. Right lung pulmonary nodules on CT are not well-defined. No pneumothorax. The heart is normal in size. Thoracic spondylosis. Bones diffusely under mineralized. IMPRESSION: 1. Chronic volume loss in the left hemithorax. Ill-defined opacity in the left upper lung zone, similar to prior CT likely reflecting treatment related change. 2. Small left pleural effusion. Electronically Signed   By: Narda Rutherford M.D.   On: 08/27/2023 16:52     ASSESSMENT/PLAN:  This is a very pleasant 82 year old Caucasian female with likely stage IV (T2a, N3, M1B) non-small cell lung cancer, adenocarcinoma presented with left upper lobe perihilar mass in addition to left hilar and mediastinal lymphadenopathy as well as left supraclavicular lymphadenopathy and a solitary brain metastasis diagnosed in September 2023. The molecular studies showed positive KRAS G12C mutation. Discussed this can be used in the second line setting in the future.    The patient underwent SRS and craniotomy under the care of Dr. Basilio Cairo and Dr. Jake Samples on 07/10/22. Dr. Jake Samples does not recommend any chemotherapy for at least 1 month from her surgery due to wound healing.    She is recently completed radiation to the chest under the care of Dr. Roselind Messier. The last day of radiation was scheduled for 08/23/22  The plan is to undergo systemic chemotherapy wit carboplatin for an AUC of 5, Alimta 500 mg/m, Keytruda 200 mg IV every 3 weeks. She is status post 14 cycles.  Starting from cycle #5, she started maintenance Alimta and  Keytruda. Her dose of Alimta is reduced to 400 mg/m2.   Labs were reviewed.  Recommend that she proceed with cycle #15 today as scheduled.  We will see her back for follow-up visit in 3 weeks for evaluation and repeat blood work. Her next infusion falls on 12/27. This is her anniversary. Therefore, we will schedule her next infusion on 12/30  She will continue on eliquis.   She will continue to follow with pulmonary medicine and cardiology.   The patient was advised to call immediately if she has any concerning symptoms in the interval. The patient voices understanding of current disease status and treatment options and is in agreement with the current care plan. All questions were answered. The patient knows to call the clinic with any problems, questions or concerns. We can certainly see the patient much sooner if necessary        Orders Placed This Encounter  Procedures   CBC with Differential (Cancer Center Only)    Standing Status:   Future    Standing Expiration Date:   09/11/2024   CMP (Cancer Center only)    Standing Status:   Future    Standing Expiration Date:   09/11/2024   T4    Standing Status:   Future    Standing Expiration Date:   09/11/2024   TSH    Standing Status:   Future    Standing Expiration Date:   09/11/2024    The total time spent in the appointment was 20-29 minutes  Fruma Africa L Praveen Coia, PA-C 09/12/23

## 2023-09-12 ENCOUNTER — Inpatient Hospital Stay: Payer: Medicare Other

## 2023-09-12 ENCOUNTER — Inpatient Hospital Stay: Payer: Medicare Other | Attending: Internal Medicine

## 2023-09-12 ENCOUNTER — Inpatient Hospital Stay (HOSPITAL_BASED_OUTPATIENT_CLINIC_OR_DEPARTMENT_OTHER): Payer: Medicare Other | Admitting: Physician Assistant

## 2023-09-12 VITALS — BP 148/60 | HR 88 | Temp 98.0°F | Resp 15 | Wt 196.6 lb

## 2023-09-12 DIAGNOSIS — F419 Anxiety disorder, unspecified: Secondary | ICD-10-CM | POA: Insufficient documentation

## 2023-09-12 DIAGNOSIS — R059 Cough, unspecified: Secondary | ICD-10-CM | POA: Diagnosis not present

## 2023-09-12 DIAGNOSIS — Z79899 Other long term (current) drug therapy: Secondary | ICD-10-CM | POA: Insufficient documentation

## 2023-09-12 DIAGNOSIS — Z95828 Presence of other vascular implants and grafts: Secondary | ICD-10-CM

## 2023-09-12 DIAGNOSIS — Z9981 Dependence on supplemental oxygen: Secondary | ICD-10-CM | POA: Insufficient documentation

## 2023-09-12 DIAGNOSIS — R590 Localized enlarged lymph nodes: Secondary | ICD-10-CM | POA: Diagnosis not present

## 2023-09-12 DIAGNOSIS — C3490 Malignant neoplasm of unspecified part of unspecified bronchus or lung: Secondary | ICD-10-CM

## 2023-09-12 DIAGNOSIS — J90 Pleural effusion, not elsewhere classified: Secondary | ICD-10-CM | POA: Diagnosis not present

## 2023-09-12 DIAGNOSIS — R0609 Other forms of dyspnea: Secondary | ICD-10-CM | POA: Diagnosis not present

## 2023-09-12 DIAGNOSIS — M7989 Other specified soft tissue disorders: Secondary | ICD-10-CM | POA: Diagnosis not present

## 2023-09-12 DIAGNOSIS — R5383 Other fatigue: Secondary | ICD-10-CM | POA: Insufficient documentation

## 2023-09-12 DIAGNOSIS — K5900 Constipation, unspecified: Secondary | ICD-10-CM | POA: Insufficient documentation

## 2023-09-12 DIAGNOSIS — C3412 Malignant neoplasm of upper lobe, left bronchus or lung: Secondary | ICD-10-CM | POA: Diagnosis present

## 2023-09-12 DIAGNOSIS — C7931 Secondary malignant neoplasm of brain: Secondary | ICD-10-CM | POA: Insufficient documentation

## 2023-09-12 DIAGNOSIS — Z923 Personal history of irradiation: Secondary | ICD-10-CM | POA: Insufficient documentation

## 2023-09-12 DIAGNOSIS — R0789 Other chest pain: Secondary | ICD-10-CM | POA: Diagnosis not present

## 2023-09-12 DIAGNOSIS — J4489 Other specified chronic obstructive pulmonary disease: Secondary | ICD-10-CM | POA: Diagnosis not present

## 2023-09-12 DIAGNOSIS — M47814 Spondylosis without myelopathy or radiculopathy, thoracic region: Secondary | ICD-10-CM | POA: Diagnosis not present

## 2023-09-12 DIAGNOSIS — Z9049 Acquired absence of other specified parts of digestive tract: Secondary | ICD-10-CM | POA: Insufficient documentation

## 2023-09-12 DIAGNOSIS — G4730 Sleep apnea, unspecified: Secondary | ICD-10-CM | POA: Insufficient documentation

## 2023-09-12 DIAGNOSIS — Z888 Allergy status to other drugs, medicaments and biological substances status: Secondary | ICD-10-CM | POA: Insufficient documentation

## 2023-09-12 DIAGNOSIS — Z87442 Personal history of urinary calculi: Secondary | ICD-10-CM | POA: Insufficient documentation

## 2023-09-12 DIAGNOSIS — Z5111 Encounter for antineoplastic chemotherapy: Secondary | ICD-10-CM | POA: Diagnosis present

## 2023-09-12 DIAGNOSIS — Z7901 Long term (current) use of anticoagulants: Secondary | ICD-10-CM | POA: Insufficient documentation

## 2023-09-12 DIAGNOSIS — Z5112 Encounter for antineoplastic immunotherapy: Secondary | ICD-10-CM | POA: Diagnosis not present

## 2023-09-12 DIAGNOSIS — L299 Pruritus, unspecified: Secondary | ICD-10-CM | POA: Diagnosis not present

## 2023-09-12 DIAGNOSIS — I10 Essential (primary) hypertension: Secondary | ICD-10-CM | POA: Insufficient documentation

## 2023-09-12 DIAGNOSIS — J441 Chronic obstructive pulmonary disease with (acute) exacerbation: Secondary | ICD-10-CM | POA: Diagnosis not present

## 2023-09-12 DIAGNOSIS — M62831 Muscle spasm of calf: Secondary | ICD-10-CM | POA: Insufficient documentation

## 2023-09-12 DIAGNOSIS — R0600 Dyspnea, unspecified: Secondary | ICD-10-CM | POA: Diagnosis not present

## 2023-09-12 DIAGNOSIS — Z9089 Acquired absence of other organs: Secondary | ICD-10-CM | POA: Insufficient documentation

## 2023-09-12 LAB — CBC WITH DIFFERENTIAL/PLATELET
Abs Immature Granulocytes: 0.02 10*3/uL (ref 0.00–0.07)
Basophils Absolute: 0.1 10*3/uL (ref 0.0–0.1)
Basophils Relative: 1 %
Eosinophils Absolute: 0.1 10*3/uL (ref 0.0–0.5)
Eosinophils Relative: 1 %
HCT: 38.1 % (ref 36.0–46.0)
Hemoglobin: 12.6 g/dL (ref 12.0–15.0)
Immature Granulocytes: 0 %
Lymphocytes Relative: 11 %
Lymphs Abs: 0.6 10*3/uL — ABNORMAL LOW (ref 0.7–4.0)
MCH: 32.6 pg (ref 26.0–34.0)
MCHC: 33.1 g/dL (ref 30.0–36.0)
MCV: 98.7 fL (ref 80.0–100.0)
Monocytes Absolute: 0.5 10*3/uL (ref 0.1–1.0)
Monocytes Relative: 8 %
Neutro Abs: 4.4 10*3/uL (ref 1.7–7.7)
Neutrophils Relative %: 79 %
Platelets: 201 10*3/uL (ref 150–400)
RBC: 3.86 MIL/uL — ABNORMAL LOW (ref 3.87–5.11)
RDW: 13.1 % (ref 11.5–15.5)
WBC: 5.6 10*3/uL (ref 4.0–10.5)
nRBC: 0 % (ref 0.0–0.2)

## 2023-09-12 LAB — COMPREHENSIVE METABOLIC PANEL
ALT: 12 U/L (ref 0–44)
AST: 13 U/L — ABNORMAL LOW (ref 15–41)
Albumin: 3.9 g/dL (ref 3.5–5.0)
Alkaline Phosphatase: 58 U/L (ref 38–126)
Anion gap: 5 (ref 5–15)
BUN: 20 mg/dL (ref 8–23)
CO2: 30 mmol/L (ref 22–32)
Calcium: 9 mg/dL (ref 8.9–10.3)
Chloride: 105 mmol/L (ref 98–111)
Creatinine, Ser: 0.66 mg/dL (ref 0.44–1.00)
GFR, Estimated: >60 mL/min (ref >60)
Glucose, Bld: 99 mg/dL (ref 70–99)
Potassium: 4.1 mmol/L (ref 3.5–5.1)
Sodium: 140 mmol/L (ref 135–145)
Total Bilirubin: 0.5 mg/dL (ref <1.2)
Total Protein: 6.1 g/dL — ABNORMAL LOW (ref 6.5–8.1)

## 2023-09-12 MED ORDER — SODIUM CHLORIDE 0.9 % IV SOLN
200.0000 mg | Freq: Once | INTRAVENOUS | Status: AC
  Administered 2023-09-12: 200 mg via INTRAVENOUS
  Filled 2023-09-12: qty 200

## 2023-09-12 MED ORDER — ONDANSETRON HCL 4 MG/2ML IJ SOLN
8.0000 mg | Freq: Once | INTRAMUSCULAR | Status: AC
  Administered 2023-09-12: 8 mg via INTRAVENOUS
  Filled 2023-09-12: qty 4

## 2023-09-12 MED ORDER — HEPARIN SOD (PORK) LOCK FLUSH 100 UNIT/ML IV SOLN
500.0000 [IU] | Freq: Once | INTRAVENOUS | Status: AC | PRN
  Administered 2023-09-12: 500 [IU]

## 2023-09-12 MED ORDER — SODIUM CHLORIDE 0.9 % IV SOLN: Freq: Once | INTRAVENOUS | Status: AC

## 2023-09-12 MED ORDER — CYANOCOBALAMIN 1000 MCG/ML IJ SOLN
1000.0000 ug | Freq: Once | INTRAMUSCULAR | Status: AC
  Administered 2023-09-12: 1000 ug via INTRAMUSCULAR
  Filled 2023-09-12: qty 1

## 2023-09-12 MED ORDER — SODIUM CHLORIDE 0.9% FLUSH
10.0000 mL | INTRAVENOUS | Status: DC | PRN
Start: 2023-09-12 — End: 2023-09-12
  Administered 2023-09-12: 10 mL

## 2023-09-12 MED ORDER — SODIUM CHLORIDE 0.9% FLUSH
10.0000 mL | Freq: Once | INTRAVENOUS | Status: AC
  Administered 2023-09-12: 10 mL

## 2023-09-12 MED ORDER — SODIUM CHLORIDE 0.9 % IV SOLN
400.0000 mg/m2 | Freq: Once | INTRAVENOUS | Status: AC
  Administered 2023-09-12: 800 mg via INTRAVENOUS
  Filled 2023-09-12: qty 20

## 2023-09-12 NOTE — Patient Instructions (Signed)
 [facility] - A DEPT OF [facility]  Discharge Instructions: Thank you for choosing [facility] to provide your oncology and hematology care.   If you have a lab appointment with the Cancer Center, please go directly to the Cancer Center and check in at the registration area.   Wear comfortable clothing and clothing appropriate for easy access to any Portacath or PICC line.   We strive to give you quality time with your provider. You may need to reschedule your appointment if you arrive late (15 or more minutes).  Arriving late affects you and other patients whose appointments are after yours.  Also, if you miss three or more appointments without notifying the office, you may be dismissed from the clinic at the provider's discretion.      For prescription refill requests, have your pharmacy contact our office and allow 72 hours for refills to be completed.    Today you received the following chemotherapy and/or immunotherapy agents: Keytruda/Alimta      To help prevent nausea and vomiting after your treatment, we encourage you to take your nausea medication as directed.  BELOW ARE SYMPTOMS THAT SHOULD BE REPORTED IMMEDIATELY: *FEVER GREATER THAN 100.4 F (38 C) OR HIGHER *CHILLS OR SWEATING *NAUSEA AND VOMITING THAT IS NOT CONTROLLED WITH YOUR NAUSEA MEDICATION *UNUSUAL SHORTNESS OF BREATH *UNUSUAL BRUISING OR BLEEDING *URINARY PROBLEMS (pain or burning when urinating, or frequent urination) *BOWEL PROBLEMS (unusual diarrhea, constipation, pain near the anus) TENDERNESS IN MOUTH AND THROAT WITH OR WITHOUT PRESENCE OF ULCERS (sore throat, sores in mouth, or a toothache) UNUSUAL RASH, SWELLING OR PAIN  UNUSUAL VAGINAL DISCHARGE OR ITCHING   Items with * indicate a potential emergency and should be followed up as soon as possible or go to the Emergency Department if any problems should occur.  Please show the CHEMOTHERAPY ALERT CARD or  IMMUNOTHERAPY ALERT CARD at check-in to the Emergency Department and triage nurse.  Should you have questions after your visit or need to cancel or reschedule your appointment, please contact [facility] - A DEPT [facility]  Dept: [phone]  and follow the prompts.  Office hours are 8:00 a.m. to 4:30 p.m. Monday - Friday. Please note that voicemails left after 4:00 p.m. may not be returned until the following business day.  We are closed weekends and major holidays. You have access to a nurse at all times for urgent questions. Please call the main number to the clinic Dept: [phone] and follow the prompts.   For any non-urgent questions, you may also contact your provider using MyChart. We now offer e-Visits for anyone 6 and older to request care online for non-urgent symptoms. For details visit [email or web address].   Also download the MyChart app! Go to the app store, search "MyChart", open the app, select [place], and log in with your MyChart username and password.

## 2023-09-13 ENCOUNTER — Other Ambulatory Visit: Payer: Self-pay

## 2023-09-17 ENCOUNTER — Ambulatory Visit: Payer: Medicare Other | Admitting: Physician Assistant

## 2023-09-17 ENCOUNTER — Other Ambulatory Visit: Payer: Medicare Other

## 2023-09-17 ENCOUNTER — Ambulatory Visit: Payer: Medicare Other

## 2023-09-22 ENCOUNTER — Other Ambulatory Visit: Payer: Self-pay | Admitting: Emergency Medicine

## 2023-09-23 ENCOUNTER — Ambulatory Visit (HOSPITAL_COMMUNITY): Payer: Medicare Other | Attending: Cardiology

## 2023-09-23 DIAGNOSIS — R0602 Shortness of breath: Secondary | ICD-10-CM | POA: Insufficient documentation

## 2023-09-23 DIAGNOSIS — M7989 Other specified soft tissue disorders: Secondary | ICD-10-CM | POA: Insufficient documentation

## 2023-09-23 LAB — ECHOCARDIOGRAM COMPLETE: S' Lateral: 3.22 cm

## 2023-09-24 ENCOUNTER — Other Ambulatory Visit (HOSPITAL_COMMUNITY): Payer: Medicare Other

## 2023-09-25 ENCOUNTER — Ambulatory Visit: Payer: Medicare Other | Attending: Physician Assistant | Admitting: Physician Assistant

## 2023-09-25 ENCOUNTER — Encounter: Payer: Self-pay | Admitting: Physician Assistant

## 2023-09-25 VITALS — BP 146/60 | HR 89 | Ht 66.0 in | Wt 199.6 lb

## 2023-09-25 DIAGNOSIS — E785 Hyperlipidemia, unspecified: Secondary | ICD-10-CM | POA: Insufficient documentation

## 2023-09-25 DIAGNOSIS — C3490 Malignant neoplasm of unspecified part of unspecified bronchus or lung: Secondary | ICD-10-CM | POA: Insufficient documentation

## 2023-09-25 DIAGNOSIS — I824Y9 Acute embolism and thrombosis of unspecified deep veins of unspecified proximal lower extremity: Secondary | ICD-10-CM | POA: Insufficient documentation

## 2023-09-25 DIAGNOSIS — R600 Localized edema: Secondary | ICD-10-CM | POA: Diagnosis present

## 2023-09-25 DIAGNOSIS — I2510 Atherosclerotic heart disease of native coronary artery without angina pectoris: Secondary | ICD-10-CM | POA: Insufficient documentation

## 2023-09-25 DIAGNOSIS — I10 Essential (primary) hypertension: Secondary | ICD-10-CM | POA: Insufficient documentation

## 2023-09-25 DIAGNOSIS — C7932 Secondary malignant neoplasm of cerebral meninges: Secondary | ICD-10-CM | POA: Insufficient documentation

## 2023-09-25 MED ORDER — TELMISARTAN 40 MG PO TABS: 40.0000 mg | ORAL_TABLET | Freq: Every day | ORAL | 3 refills | Status: AC

## 2023-09-25 NOTE — Progress Notes (Signed)
Cardiology Office Note:  .   Date:  09/27/2023  ID:  Laurie Mccoy, DOB [birth year], MRN [id number] PCP: [name], NP  [facility] Providers Cardiologist:  Reatha Harps, MD     History of Present Illness: .   Laurie Mccoy is a 82 y.o. 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  She has a history of nonobstructive CAD seen on coronary CT in December 2020.  Calcium score was 662 which placed the patient at the 69th percentile for age and sex matched control.  PFO was noted.  Patient was seen by Dr. Bufford Buttner in September 2022, since the last visit, patient was diagnosed with stage IV non-small cell lung cancer.  She has left upper lobe perihilar mass in addition to left hilar and mediastinal lymphadenopathy in the left supraclavicular lymphadenopathy with brain metastasis diagnosed in September 2023.  Patient underwent SRS with craniotomy under the care of Dr. Basilio Cairo and Dr. Dorinda Hill in October 2023.  She has completed radiation therapy on to her chest under the care of Dr. Roselind Messier.  Last day of radiation was scheduled for November 2023.  Patient was admitted to the hospital in November 2023 for pneumonia of the left lower lobe.  She is currently undergoing palliative chemotherapy and immunotherapy.  She is finished 13 cycle of chemotherapy.  She was last seen by Rise Paganini NP on 08/18/2023 at which time her cancer status was stable.  She has been having shortness of breath with exertion which is chronic.  She has been having mild lower extremity edema which did not improve with leg elevation compression stocking.  Her blood pressure has also been elevated.  She was taking telmisartan as needed, this was switched to daily dosing.  She was also given 20 mg Lasix as needed.  Echocardiogram obtained on 08/18/2023 demonstrated EF 55 to 60%, grade 1 DD, RVSP 34.8 mmHg, trivial MR.  Patient presents today for  follow-up accompanied by her husband.  She denies any chest pain.  She does have increased shortness of breath today and feels she needed a breathing treatment.  She forgot to take the breathing treatment before she arrived.  O2 saturation was 91% on room air.  She has diminished breath sound especially in the left lung.  Her blood pressure has been elevating in the 130s to 140s at home.  On initial arrival, blood pressure was elevated in the 160 range.  Even on repeat manual recheck by myself, blood pressure remained elevated in the 140s.  I recommended increase telmisartan to 40 mg daily.  On physical exam, she does not have significant leg edema.  She has not used Lasix in over a month.  She is aware to use the Lasix if she has increased leg edema.  She appears to be euvolemic on exam.  I am concerned for her pulmonary status.  She is aware to pay closer attention to her breathing.  ROS:   Patient has chronic shortness of breath but denies any chest pain.  She has no lower extremity edema, orthopnea or PND.  Studies Reviewed: .        Cardiac Studies & Procedures      ECHOCARDIOGRAM  ECHOCARDIOGRAM COMPLETE 09/23/2023  Narrative ECHOCARDIOGRAM REPORT    Patient Name:   Laurie Mccoy Date of Exam: 09/23/2023 Medical Rec #:  [id number]         Height:  66.0 in Accession #:    [id number]        Weight:       196.6 lb Date of Birth:  1940-12-23          BSA:          1.985 m Patient Age:    82 years          BP:           148/60 mmHg Patient Gender: F                 HR:           85 bpm. Exam Location:  [address]  Procedure: 2D Echo, 3D Echo, Cardiac Doppler, Color Doppler and Strain Analysis  Indications:    Leg swelling [244481] SOB (shortness of breath) [[id number]]  History:        Patient has no prior history of Echocardiogram examinations. COPD, Signs/Symptoms:Shortness of Breath; Risk Factors:Hypertension, Sleep Apnea and Former Smoker.  Sonographer:    Eulah Pont RDCS Referring Phys: [id number] M[place] L [place]   Sonographer Comments: Global longitudinal strain was attempted. IMPRESSIONS   1. Left ventricular ejection fraction, by estimation, is 55 to 60%. The left ventricle has normal function. The left ventricle has no regional wall motion abnormalities. Left ventricular diastolic parameters are consistent with Grade I diastolic dysfunction (impaired relaxation). The average left ventricular global longitudinal strain is -17.3 %. The global longitudinal strain is normal. 2. Right ventricular systolic function is normal. The right ventricular size is normal. There is normal pulmonary artery systolic pressure. The estimated right ventricular systolic pressure is 34.8 mmHg. 3. The mitral valve is normal in structure. Trivial mitral valve regurgitation. No evidence of mitral stenosis. 4. The aortic valve is normal in structure. Aortic valve regurgitation is not visualized. No aortic stenosis is present. 5. The inferior vena cava is normal in size with greater than 50% respiratory variability, suggesting right atrial pressure of 3 mmHg.  FINDINGS Left Ventricle: Left ventricular ejection fraction, by estimation, is 55 to 60%. The left ventricle has normal function. The left ventricle has no regional wall motion abnormalities. The average left ventricular global longitudinal strain is -17.3 %. The global longitudinal strain is normal. The left ventricular internal cavity size was normal in size. There is no left ventricular hypertrophy. Left ventricular diastolic parameters are consistent with Grade I diastolic dysfunction (impaired relaxation).  Right Ventricle: The right ventricular size is normal. No increase in right ventricular wall thickness. Right ventricular systolic function is normal. There is normal pulmonary artery systolic pressure. The tricuspid regurgitant velocity is 2.82 m/s, and with an assumed right atrial pressure of 3 mmHg, the  estimated right ventricular systolic pressure is 34.8 mmHg.  Left Atrium: Left atrial size was normal in size.  Right Atrium: Right atrial size was normal in size.  Pericardium: There is no evidence of pericardial effusion.  Mitral Valve: The mitral valve is normal in structure. There is moderate calcification of the mitral valve leaflet(s). Trivial mitral valve regurgitation. No evidence of mitral valve stenosis.  Tricuspid Valve: The tricuspid valve is normal in structure. Tricuspid valve regurgitation is mild . No evidence of tricuspid stenosis.  Aortic Valve: The aortic valve is normal in structure. Aortic valve regurgitation is not visualized. No aortic stenosis is present.  Pulmonic Valve: The pulmonic valve was normal in structure. Pulmonic valve regurgitation is not visualized. No evidence of pulmonic stenosis.  Aorta: The aortic root is normal in size and  structure.  Venous: The inferior vena cava is normal in size with greater than 50% respiratory variability, suggesting right atrial pressure of 3 mmHg.  IAS/Shunts: No atrial level shunt detected by color flow Doppler.   LEFT VENTRICLE PLAX 2D LVIDd:         4.85 cm   Diastology LVIDs:         3.22 cm   LV e' medial:    8.59 cm/s LV PW:         0.92 cm   LV E/e' medial:  9.4 LV IVS:        0.92 cm   LV e' lateral:   9.99 cm/s LVOT diam:     2.00 cm   LV E/e' lateral: 8.1 LV SV:         73 LV SV Index:   37        2D Longitudinal Strain LVOT Area:     3.14 cm  2D Strain GLS Avg:     -17.3 %  3D Volume EF: 3D EF:        52 % LV EDV:       136 ml LV ESV:       65 ml LV SV:        71 ml  RIGHT VENTRICLE RV S prime:     16.10 cm/s TAPSE (M-mode): 1.8 cm  LEFT ATRIUM             Index        RIGHT ATRIUM           Index LA diam:        3.40 cm 1.71 cm/m   RA Area:     16.40 cm LA Vol (A2C):   47.8 ml 24.08 ml/m  RA Volume:   38.20 ml  19.24 ml/m LA Vol (A4C):   40.1 ml 20.20 ml/m LA Biplane Vol: 44.5 ml 22.41  ml/m AORTIC VALVE LVOT Vmax:   111.00 cm/s LVOT Vmean:  75.600 cm/s LVOT VTI:    0.233 m  AORTA Ao Root diam: 3.10 cm Ao Asc diam:  3.70 cm  MV E velocity: 80.90 cm/s   TRICUSPID VALVE MV A velocity: 137.00 cm/s  TR Peak grad:   31.8 mmHg MV E/A ratio:  0.59         TR Vmax:        282.00 cm/s  SHUNTS Systemic VTI:  0.23 m Systemic Diam: 2.00 cm  Donato Schultz MD Electronically signed by Donato Schultz MD Signature Date/Time: 09/23/2023/1:23:50 PM    Final    CT SCANS  CT CORONARY MORPH W/CTA COR W/SCORE 09/23/2019  Addendum 09/23/2019  7:09 PM ADDENDUM REPORT: 09/23/2019 19:06  CLINICAL DATA:  Chest pain  EXAM: Cardiac/Coronary CTA  TECHNIQUE: The patient was scanned on a [employer]. A 100 kV prospective scan was triggered in the descending thoracic aorta at 111 HU's. Axial non-contrast 3 mm slices were carried out through the heart. The data set was analyzed on a dedicated work station and scored using the Agatson method. Gantry rotation speed was 250 msecs and collimation was .6 mm. No beta blockade and 0.8 mg of sl NTG was given. The 3D data set was reconstructed in 5% intervals of the 35-75 % of the R-R cycle. Diastolic phases were analyzed on a dedicated work station using MPR, MIP and VRT modes. The patient received 80 cc of contrast.  FINDINGS: Image quality: Excellent.  Noise artifact is: Limited.  Coronary  Arteries:  Normal coronary origin.  Right dominance.  Left main: The left main is a large caliber vessel with a normal take off from the left coronary cusp that bifurcates to form a left anterior descending artery and a left circumflex artery. The distal left main contains minimal calcified plaque (<25%).  Left anterior descending artery: The proximal LAD contains minimal calcified plaque (<25%). The mid LAD contains minimal calcified plaque (<25%). The distal LAD is patent without plaque or stenosis. The first 2 diagonal branches  are small and patent. The 3rd diagonal branch is a medium size vessel with mild calcified plaque (25-49%).  Left circumflex artery: The LCX is non-dominant. The proximal LCX contains minimal calcified plaque (<25%). The mid and distal LCX is patent without plaque or stenosis. The LCX gives off 1 large OM branch that has mild calcified plaque (25-49%).  Right coronary artery: The RCA is dominant. The ostial RCA contains mild (25-49%) non-calcified plaque. The proximal RCA contains mild calcified plaque (25-49%). The mid RCA contains mild, mixed density plaque (25-49%). The distal RCA is patent. The RCA terminates as a PDA and right posterolateral branch without evidence of plaque or stenosis.  Right Atrium: Right atrial size is within normal limits.  Right Ventricle: The right ventricular cavity is within normal limits.  Left Atrium: Left atrial size is normal in size with no left atrial appendage filling defect. A PFO is present.  Left Ventricle: The ventricular cavity size is within normal limits. There are no stigmata of prior infarction. There is no abnormal filling defect.  Pulmonary arteries: Normal in size without proximal filling defect.  Pulmonary veins: Normal pulmonary venous drainage.  Pericardium: Normal thickness with no significant effusion or calcium present.  Cardiac valves: The aortic valve is trileaflet without significant calcification. The mitral valve is normal structure without significant calcification.  Aorta: Normal caliber. Mild to moderate atherosclerosis in the aortic root, ascending and descending aorta.  Extra-cardiac findings: See attached radiology report for non-cardiac structures.  IMPRESSION: 1. Coronary calcium score of 662. This was 90 percentile for age and sex matched control.  2. Normal coronary origin with right dominance.  3. Mild non-obstructive (25-49%) CAD in the LAD, LCX, RCA.  4. PFO noted.  RECOMMENDATIONS: 1. Mild  non-obstructive CAD (25-49%). Consider non-atherosclerotic causes of chest pain. Consider preventive therapy and risk factor modification.  Lennie Odor, MD   Electronically Signed By: Lennie Odor On: 09/23/2019 19:06  Narrative EXAM: OVER-READ INTERPRETATION  CT CHEST  The following report is an over-read performed by radiologist Dr. Jeronimo Greaves of [facility] on 09/23/2019. This over-read does not include interpretation of cardiac or coronary anatomy or pathology. The coronary CTA interpretation by the cardiologist is attached.  COMPARISON:  None.  FINDINGS: Vascular: Normal aortic caliber. Aortic atherosclerosis. No central pulmonary embolism, on this non-dedicated study.  Mediastinum/Nodes: No imaged thoracic adenopathy.  Fluid level in the esophagus on 09/11.  Lungs/Pleura: No pleural fluid.  Incompletely imaged right upper lobe ground-glass nodule of 1.1 cm on 01/12.  Lingular volume loss and scarring or atelectasis.  Upper Abdomen: Normal imaged portions of the liver, spleen.  Musculoskeletal: No acute osseous abnormality. Moderate midthoracic spondylosis.  IMPRESSION: 1.  No acute findings in the imaged extracardiac chest. 2. Esophageal air fluid level suggests dysmotility or gastroesophageal reflux. 3.  Aortic Atherosclerosis (ICD10-I70.0). 4. Right upper lobe ground-glass nodule, incompletely imaged but on the order of 1.1 cm. Consider further evaluation with complete chest CT.  These results will be  called to the ordering clinician or representative by the Radiologist Assistant, and communication documented in the PACS or zVision Dashboard.  Electronically Signed: By: Jeronimo Greaves M.D. On: 09/23/2019 12:37          Risk Assessment/Calculations:          Physical Exam:   VS:  BP (!) 146/60   Pulse 89   Ht 5\' 6"  (1.676 m)   Wt 199 lb 9.6 oz (90.5 kg)   SpO2 91%   BMI 32.22 kg/m    Wt Readings from Last 3 Encounters:   09/25/23 199 lb 9.6 oz (90.5 kg)  09/12/23 196 lb 9.6 oz (89.2 kg)  08/27/23 195 lb (88.5 kg)    GEN: Well nourished, well developed in no acute distress NECK: No JVD; No carotid bruits CARDIAC: RRR, no murmurs, rubs, gallops RESPIRATORY:  Clear to auscultation without rales, wheezing or rhonchi  ABDOMEN: Soft, non-tender, non-distended EXTREMITIES:  No edema; No deformity   ASSESSMENT AND PLAN: .    CAD: Patient denies any recent chest pain  Chronic DVT: On Eliquis 5 mg twice a day  Stage IV Lung Cancer Stable disease status with brain metastasis. Completed radiation therapy and currently undergoing palliative chemotherapy and immunotherapy. Recent hospitalization for left lower lobe pneumonia. Chronic shortness of breath with exertion. -Continue current chemotherapy and immunotherapy regimen.  Hypertension Elevated blood pressure readings at home, currently on Telmisartan 20mg  daily. -Increase Telmisartan to 40mg  daily.  Hyperlipidemia LDL slightly above goal of less than 70 for patient's history of mild coronary artery disease. -Encourage dietary modifications to lower LDL.  Lower Extremity Edema Mild to moderate lower extremity edema, not responsive to leg elevation and compression stockings. Echocardiogram showed normal ejection fraction and mild diastolic dysfunction. No recent use of Lasix. -Continue Lasix 20mg  as needed for edema. -Encourage leg elevation.        Dispo: Follow-up in 3-4 months with Dr. Flora Lipps.  Signed, Azalee Course, [place]

## 2023-09-25 NOTE — Patient Instructions (Signed)
Medication Instructions:  INCREASE TELMISARTAN TO 40 MG DAILY *If you need a refill on your cardiac medications before your next appointment, please call your pharmacy*   Lab Work: NO LABS If you have labs (blood work) drawn today and your tests are completely normal, you will receive your results only by: MyChart Message (if you have MyChart) OR A paper copy in the mail If you have any lab test that is abnormal or we need to change your treatment, we will call you to review the results.   Testing/Procedures: NO TESTING   Follow-Up: At [facility], you and your health needs are our priority.  As part of our continuing mission to provide you with exceptional heart care, we have created designated Provider Care Teams.  These Care Teams include your primary Cardiologist (physician) and Advanced Practice Providers (APPs -  Physician Assistants and Nurse Practitioners) who all work together to provide you with the care you need, when you need it.    Your next appointment:   4 month(s)  Provider:   Lennie Odor, MD

## 2023-09-26 ENCOUNTER — Other Ambulatory Visit (HOSPITAL_COMMUNITY): Payer: Medicare Other

## 2023-09-26 ENCOUNTER — Other Ambulatory Visit: Payer: Self-pay

## 2023-09-30 NOTE — Progress Notes (Signed)
[facility] OFFICE PROGRESS NOTE  Laurie Manson, NP [phone] [address] [place] [address]  DIAGNOSIS: Stage IV (T2 a, N3, M1b) non-small cell lung cancer, adenocarcinoma presented with left upper lobe perihilar mass in addition to left hilar and mediastinal lymphadenopathy as well as left supraclavicular lymphadenopathy with brain metastasis diagnosed in September 2023.   [name]   Detected Alteration(s) / Biomarker(s)          Associated FDA-approved therapies  Clinical Trial Availability          % cfDNA or Amplification   KRAS G12C approved by FDA Adagrasib, Sotorasib Yes    1.7%   TP53 R249S None Yes          1.5%  PRIOR THERAPY: 1) SRS the the metastatic brain lesion under the care of Dr. Basilio Cairo 2) Craniotomy on 07/10/22 under the care of Dr. Jake Samples 3) radiation to the chest under care of Dr. Roselind Messier.  Last dose on 08/23/2022  CURRENT THERAPY: Systemic chemotherapy with carboplatin for an AUC of 5, to 500 mg/m2, and Keytruda 200 mg IV every 3 weeks.  First dose expected on October 08, 2022. Starting from cycle #5 she will be on maintenance treatment with Alimta and Keytruda every 3 weeks. Status post 15 cycles. Will reduce her dose of alimta to 400 mg/m2 secondary to intolerance   INTERVAL HISTORY: Laurie Mccoy 82 y.o. female returns to the clinic today for a follow-up visit accompanied by her husband. She called the clinic earlier today to cancel her infusion due to not feeling well.  The patient was last seen in clinic on 09/12/23. She struggles with frequent COPD exacerbations. She averages having a CT angio or CT chest every 1-2 months. She was feeling well at her last appointment since her prior COPD exacerbation. However, she is feeling very unwell today. She states this started to get worse around Christmas. Per chart review, it looks like she was having some worsening in her breathing for her last cardiology follow up on 09/25/23 as well. She has not followed up  with her pulmonologist since her last appointment last month. [facility] is followed by [facility] in [place]. She is on 10 mg of prednisone daily for COPD.  She did not take her prednisone today.  She also did not use her nebulizer today.   Today, she is having significant shortness of breath with minimal exertion and right lower diaphragm pain which she describes as a charley horse.  She has had this pain in the past.  She has supplemental oxygen at home if needed.  Her oxygen is 92% on room air.  She has intermittent lower extremity swelling at baseline that comes and goes.  Her swelling is a little bit worse today.  She is compliant with her Eliquis and has not missed any recent doses.  Denies any nausea, vomiting, diarrhea, or constipation.  She denies any recent antibiotics.  She denies any sick contacts.  Denies any associated fevers.  She is here for evaluation and repeat blood work before undergoing cycle #16     MEDICAL HISTORY: Past Medical History:  Diagnosis Date   Anxiety    "had panic attacks years ago"   Asthma    COPD (chronic obstructive pulmonary disease) (HCC)    Goiter 2022   History of hiatal hernia    "dx in college, never bothered me"   History of kidney stones 2012   History of radiation therapy  Left Lung-07/10/22-08/23/22- Dr. Antony Blackbird   Hypertension    Lung cancer [facility])    Sleep apnea     ALLERGIES:  is allergic to compazine [prochlorperazine], norvasc [amlodipine], and trelegy ellipta [fluticasone-umeclidin-vilant].  MEDICATIONS:  Current Outpatient Medications  Medication Sig Dispense Refill   albuterol (VENTOLIN HFA) 108 (90 Base) MCG/ACT inhaler Inhale 2 puffs into the lungs every 6 (six) hours as needed for wheezing or shortness of breath. 18 g 0   ALPRAZolam (XANAX) 0.25 MG tablet Take 0.25 mg by mouth at bedtime as needed for anxiety.     amoxicillin-clavulanate (AUGMENTIN) 875-125 MG tablet Take 1 tablet by mouth 2 (two) times daily.  (Patient not taking: Reported on 08/27/2023) 14 tablet 0   Budeson-Glycopyrrol-Formoterol (BREZTRI AEROSPHERE) 160-9-4.8 MCG/ACT AERO INHALE 2 PUFFS BY MOUTH EVERY MORNING AND EVERY NIGHT AT BEDTIME (Patient taking differently: Inhale 2 puffs into the lungs in the morning and at bedtime.) 10.7 g 5   ELIQUIS 5 MG TABS tablet Take 1 tablet (5 mg total) by mouth 2 (two) times daily. 60 tablet 3   folic acid (FOLVITE) 1 MG tablet TAKE ONE TABLET BY MOUTH EVERY DAY 30 tablet 2   furosemide (LASIX) 20 MG tablet 1 tablet p.o. daily as needed for increased leg swelling. 20 tablet 0   gabapentin (NEURONTIN) 100 MG capsule Take 1 capsule (100 mg total) by mouth 2 (two) times daily. (Patient not taking: Reported on 08/27/2023) 90 capsule 2   hydrOXYzine (ATARAX) 10 MG tablet Take 1 tablet (10 mg total) by mouth 3 (three) times daily as needed. 60 tablet 2   ipratropium-albuterol (DUONEB) 0.5-2.5 (3) MG/3ML SOLN Take 3 mLs by nebulization every 6 (six) hours as needed. 360 mL 0   levocetirizine (XYZAL) 5 MG tablet Take 5 mg by mouth at bedtime as needed (for seasonal allergies- if not taking generic Claritin).     lidocaine-prilocaine (EMLA) cream Apply 1 Application topically as needed. 30 g 2   LORazepam (ATIVAN) 1 MG tablet Take 1 tablet (1 mg total) by mouth as needed. 30 min Prior to MRI or brain radiation procedure 5 tablet 0   ondansetron (ZOFRAN) 8 MG tablet Take 1 tablet (8 mg total) by mouth every 8 (eight) hours as needed for nausea or vomiting. 30 tablet 2   oxyCODONE (OXY IR/ROXICODONE) 5 MG immediate release tablet Take 1 tablet (5 mg total) by mouth every 4 (four) hours as needed for severe pain. 12 tablet 0   pantoprazole (PROTONIX) 20 MG tablet Take 20 mg by mouth every evening.     predniSONE (DELTASONE) 10 MG tablet Take 2 tablets (20 mg total) by mouth daily. (Patient taking differently: Take 20 mg by mouth as needed.) 20 tablet 0   telmisartan (MICARDIS) 40 MG tablet Take 1 tablet (40 mg  total) by mouth daily. 90 tablet 3   traMADol (ULTRAM) 50 MG tablet Take 2 tablets (100 mg total) by mouth every 6 (six) hours as needed for severe pain. (Patient taking differently: Take 50 mg by mouth every 6 (six) hours as needed for severe pain (pain score 7-10).) 30 tablet 0   Zinc Acetate, Oral, (ZINC ACETATE PO) Take 1 tablet by mouth daily. Received from herbalist     No current facility-administered medications for this visit.    SURGICAL HISTORY:  Past Surgical History:  Procedure Laterality Date   APPENDECTOMY     APPLICATION OF CRANIAL NAVIGATION N/A 07/11/2022   Procedure: APPLICATION OF CRANIAL NAVIGATION;  Surgeon: Monia Pouch  C, DO;  Location: [facility] OR;  Service: Neurosurgery;  Laterality: N/A;   BRONCHIAL BIOPSY  06/12/2021   Procedure: BRONCHIAL BIOPSIES;  Surgeon: Josephine Igo, DO;  Location: [facility];  Service: Pulmonary;;   BRONCHIAL BRUSHINGS  06/12/2021   Procedure: BRONCHIAL BRUSHINGS;  Surgeon: Josephine Igo, DO;  Location: [facility];  Service: Pulmonary;;   BRONCHIAL WASHINGS  06/12/2021   Procedure: BRONCHIAL WASHINGS;  Surgeon: Josephine Igo, DO;  Location: [facility];  Service: Pulmonary;;   CHOLECYSTECTOMY     COLON RESECTION     CRANIOTOMY N/A 07/11/2022   Procedure: Craniotomy for resection of tumor;  Surgeon: Bethann Goo, DO;  Location: [facility] OR;  Service: Neurosurgery;  Laterality: N/A;  RM 19   FIDUCIAL MARKER PLACEMENT  06/12/2021   Procedure: FIDUCIAL MARKER PLACEMENT;  Surgeon: Josephine Igo, DO;  Location: [facility];  Service: Pulmonary;;   hernia     x 2   IR IMAGING GUIDED PORT INSERTION  10/11/2022   TONSILLECTOMY     VIDEO BRONCHOSCOPY WITH ENDOBRONCHIAL NAVIGATION Bilateral 06/12/2021   Procedure: VIDEO BRONCHOSCOPY WITH ENDOBRONCHIAL NAVIGATION;  Surgeon: Josephine Igo, DO;  Location: [facility];  Service: Pulmonary;  Laterality: Bilateral;  ION   VIDEO BRONCHOSCOPY WITH RADIAL ENDOBRONCHIAL ULTRASOUND  06/12/2021    Procedure: RADIAL ENDOBRONCHIAL ULTRASOUND;  Surgeon: Josephine Igo, DO;  Location: [facility];  Service: Pulmonary;;    REVIEW OF SYSTEMS:   Constitutional: Positive for fatigue. Negative for appetite change, chills, fever and unexpected weight change.  HENT: Negative for mouth sores, nosebleeds, sore throat and trouble swallowing.   Eyes: Negative for eye problems and icterus.  Respiratory: Positive for dyspnea and chest tightness.  Negative for cough, hemoptysis, and wheezing.    Cardiovascular: Positive for chest tightness and discomfort. Feels like charlie horse.  Gastrointestinal: Negative for abdominal pain, constipation, diarrhea, nausea and vomiting.  Genitourinary: Negative for bladder incontinence, difficulty urinating, dysuria, frequency and hematuria.   Musculoskeletal: Negative for back pain, gait problem, neck pain and neck stiffness.  Skin: Negative for itching and rash.  Neurological: Negative for dizziness, extremity weakness, gait problem, headaches, light-headedness and seizures.  Hematological: Negative for adenopathy. Does not bruise/bleed easily.  Psychiatric/Behavioral: Negative for confusion, depression and sleep disturbance. The patient is not nervous/anxious.       PHYSICAL EXAMINATION:  Blood pressure 135/72, pulse 100, temperature (!) 97.5 F (36.4 C), temperature source Temporal, resp. rate 18, SpO2 92%.  ECOG PERFORMANCE STATUS: 2  Physical Exam  Constitutional: Oriented to person, place, and time and well-developed, well-nourished. She appeared uncomfortable.  HENT:  Head: Normocephalic and atraumatic.  Mouth/Throat: Oropharynx is clear and moist. No oropharyngeal exudate.  Eyes: Conjunctivae are normal. Right eye exhibits no discharge. Left eye exhibits no discharge. No scleral icterus.  Neck: Normal range of motion. Neck supple.  Cardiovascular: Normal rate, regular rhythm, normal heart sounds and intact distal pulses.   Pulmonary/Chest:  Tachypnic with short shallow breaths and quiet breath sounds bilaterally. No wheezes. No rales.  Abdominal: Soft. Bowel sounds are normal. Exhibits no distension and no mass. There is no tenderness.  Musculoskeletal: Normal range of motion. Exhibits no edema.  Lymphadenopathy:    No cervical adenopathy.  Neurological: Alert and oriented to person, place, and time. Exhibits also wasting.  Examined in the chair.  Skin: Skin is warm and dry. No rash noted. Not diaphoretic. No erythema. No pallor.  Psychiatric: Mood, memory and judgment normal.  Vitals reviewed.  LABORATORY DATA: Lab Results  Component Value Date   WBC 3.7 (L) 10/06/2023   HGB 11.9 (L) 10/06/2023   HCT 36.4 10/06/2023   MCV 96.6 10/06/2023   PLT 213 10/06/2023      Chemistry      Component Value Date/Time   NA 137 10/06/2023 1343   NA 139 09/17/2019 1550   K 4.2 10/06/2023 1343   CL 101 10/06/2023 1343   CO2 30 10/06/2023 1343   BUN 12 10/06/2023 1343   BUN 9 09/17/2019 1550   CREATININE 0.55 10/06/2023 1343      Component Value Date/Time   CALCIUM 9.0 10/06/2023 1343   ALKPHOS 72 10/06/2023 1343   AST 14 (L) 10/06/2023 1343   ALT 10 10/06/2023 1343   BILITOT 0.4 10/06/2023 1343       RADIOGRAPHIC STUDIES:  ECHOCARDIOGRAM COMPLETE Result Date: 09/23/2023    ECHOCARDIOGRAM REPORT   Patient Name:   Laurie Mccoy Date of Exam: 09/23/2023 Medical Rec #:  [id number]         Height:       66.0 in Accession #:    [id number]        Weight:       196.6 lb Date of Birth:  [birth year]          BSA:          1.985 m Patient Age:    82 years          BP:           148/60 mmHg Patient Gender: F                 HR:           85 bpm. Exam Location:  [address] Procedure: 2D Echo, 3D Echo, Cardiac Doppler, Color Doppler and Strain Analysis Indications:    Leg swelling [244481]                 SOB (shortness of breath) [[id number]]  History:        Patient has no prior history of Echocardiogram examinations.                  COPD, Signs/Symptoms:Shortness of Breath; Risk                 Factors:Hypertension, Sleep Apnea and Former Smoker.  Sonographer:    Eulah Pont RDCS Referring Phys: [id number] M[place]ISON L [place]  Sonographer Comments: Global longitudinal strain was attempted. IMPRESSIONS  1. Left ventricular ejection fraction, by estimation, is 55 to 60%. The left ventricle has normal function. The left ventricle has no regional wall motion abnormalities. Left ventricular diastolic parameters are consistent with Grade I diastolic dysfunction (impaired relaxation). The average left ventricular global longitudinal strain is -17.3 %. The global longitudinal strain is normal.  2. Right ventricular systolic function is normal. The right ventricular size is normal. There is normal pulmonary artery systolic pressure. The estimated right ventricular systolic pressure is 34.8 mmHg.  3. The mitral valve is normal in structure. Trivial mitral valve regurgitation. No evidence of mitral stenosis.  4. The aortic valve is normal in structure. Aortic valve regurgitation is not visualized. No aortic stenosis is present.  5. The inferior vena cava is normal in size with greater than 50% respiratory variability, suggesting right atrial pressure of 3 mmHg. FINDINGS  Left Ventricle: Left ventricular ejection fraction, by estimation, is 55 to 60%. The left ventricle has normal function. The left ventricle has no regional wall motion abnormalities. The average  left ventricular global longitudinal strain is -17.3 %. The global longitudinal strain is normal. The left ventricular internal cavity size was normal in size. There is no left ventricular hypertrophy. Left ventricular diastolic parameters are consistent with Grade I diastolic dysfunction (impaired relaxation). Right Ventricle: The right ventricular size is normal. No increase in right ventricular wall thickness. Right ventricular systolic function is normal. There is normal pulmonary artery  systolic pressure. The tricuspid regurgitant velocity is 2.82 m/s, and  with an assumed right atrial pressure of 3 mmHg, the estimated right ventricular systolic pressure is 34.8 mmHg. Left [facility]: Left atrial size was normal in size. Right [facility]: Right atrial size was normal in size. Pericardium: There is no evidence of pericardial effusion. Mitral Valve: The mitral valve is normal in structure. There is moderate calcification of the mitral valve leaflet(s). Trivial mitral valve regurgitation. No evidence of mitral valve stenosis. Tricuspid Valve: The tricuspid valve is normal in structure. Tricuspid valve regurgitation is mild . No evidence of tricuspid stenosis. Aortic Valve: The aortic valve is normal in structure. Aortic valve regurgitation is not visualized. No aortic stenosis is present. Pulmonic Valve: The pulmonic valve was normal in structure. Pulmonic valve regurgitation is not visualized. No evidence of pulmonic stenosis. Aorta: The aortic root is normal in size and structure. Venous: The inferior vena cava is normal in size with greater than 50% respiratory variability, suggesting right atrial pressure of 3 mmHg. IAS/Shunts: No atrial level shunt detected by color flow Doppler.  LEFT VENTRICLE PLAX 2D LVIDd:         4.85 cm   Diastology LVIDs:         3.22 cm   LV e' medial:    8.59 cm/s LV PW:         0.92 cm   LV E/e' medial:  9.4 LV IVS:        0.92 cm   LV e' lateral:   9.99 cm/s LVOT diam:     2.00 cm   LV E/e' lateral: 8.1 LV SV:         73 LV SV Index:   37        2D Longitudinal Strain LVOT Area:     3.14 cm  2D Strain GLS Avg:     -17.3 %                           3D Volume EF:                          3D EF:        52 %                          LV EDV:       136 ml                          LV ESV:       65 ml                          LV SV:        71 ml RIGHT VENTRICLE RV S prime:     16.10 cm/s TAPSE (M-mode): 1.8 cm LEFT ATRIUM             Index  RIGHT ATRIUM           Index LA diam:         3.40 cm 1.71 cm/m   RA Area:     16.40 cm LA Vol (A2C):   47.8 ml 24.08 ml/m  RA Volume:   38.20 ml  19.24 ml/m LA Vol (A4C):   40.1 ml 20.20 ml/m LA Biplane Vol: 44.5 ml 22.41 ml/m  AORTIC VALVE LVOT Vmax:   111.00 cm/s LVOT Vmean:  75.600 cm/s LVOT VTI:    0.233 m  AORTA Ao Root diam: 3.10 cm Ao Asc diam:  3.70 cm MV E velocity: 80.90 cm/s   TRICUSPID VALVE MV A velocity: 137.00 cm/s  TR Peak grad:   31.8 mmHg MV E/A ratio:  0.59         TR Vmax:        282.00 cm/s                              SHUNTS                             Systemic VTI:  0.23 m                             Systemic Diam: 2.00 cm Donato Schultz MD Electronically signed by Donato Schultz MD Signature Date/Time: 09/23/2023/1:23:50 PM    Final      ASSESSMENT/PLAN:  This is a very pleasant 82 year old Caucasian female with likely stage IV (T2a, N3, M1B) non-small cell lung cancer, adenocarcinoma presented with left upper lobe perihilar mass in addition to left hilar and mediastinal lymphadenopathy as well as left supraclavicular lymphadenopathy and a solitary brain metastasis diagnosed in September 2023. The molecular studies showed positive KRAS G12C mutation. Discussed this can be used in the second line setting in the future.    The patient underwent SRS and craniotomy under the care of Dr. Basilio Cairo and Dr. Jake Samples on 07/10/22. Dr. Jake Samples does not recommend any chemotherapy for at least 1 month from her surgery due to wound healing.    She is recently completed radiation to the chest under the care of Dr. Roselind Messier. The last day of radiation was scheduled for 08/23/22   The plan is to undergo systemic chemotherapy wit carboplatin for an AUC of 5, Alimta 500 mg/m, Keytruda 200 mg IV every 3 weeks. She is status post 15 cycles.  Starting from cycle #5, she started maintenance Alimta and Keytruda. Her dose of Alimta is reduced to 400 mg/m2.   The patient was seen with Dr. Arbutus Ped today.  The patient does struggle with frequent COPD  exacerbations.  She is currently on 10 mg of prednisone although she did not take her dose today.  She is often sent to the emergency room for shortness of breath and respiratory distress from our office, her most recent being last month on 08/27/2023.  She is typically treated for COPD exacerbation.  The patient looks uncomfortable today and has similar symptoms with significant shortness of breath and charley horse-like sensation on her right diaphragm to when she has COPD exacerbations in the past. She has had several CTA's which fortunately have been negative for PE to date. She is compliant with her eliquis.  Her oxygen is 92% on room air.  She does wear supplemental oxygen at  home as needed for shortness of breath.  Her breath sounds are quiet on exam today most consistent with COPD exacerbation.  She did not use a nebulizer today.  We discussed the options including ER evaluation to rule out other etiologies of shortness of breath such as PE, pneumonia, etc. The patient strongly prefers to avoid emergency room evaluation if possible.  We reached out to her pulmonology office who will work her in to be seen today.  Her vitals are stable.  The patient was strongly encouraged in the future to reach out to pulmonary medicine should she have changes in her breathing/COPD before her symptoms get significant.  The patient does have prednisone at home and they could give her instructions about course of prednisone taper or consideration of antibiotics to avoid waiting to be seen for her routine chemotherapy visits. Waiting until she is having significant symptoms interferes with her ability to undergo her treatment as she has been sent to ER from our office in the past.   We will defer her infusion by 1 week.  Labs are acceptable for treatment if her breathing/symptoms are improved at that time.   She will continue on eliquis.    She will continue to follow with pulmonary medicine and cardiology.  The  patient was advised to call immediately if she has any concerning symptoms in the interval. The patient voices understanding of current disease status and treatment options and is in agreement with the current care plan. All questions were answered. The patient knows to call the clinic with any problems, questions or concerns. We can certainly see the patient much sooner if necessary       Orders Placed This Encounter  Procedures   CBC with Differential (Cancer Center Only)    Standing Status:   Future    Expected Date:   10/13/2023    Expiration Date:   10/12/2024   CMP (Cancer Center only)    Standing Status:   Future    Expected Date:   10/13/2023    Expiration Date:   10/12/2024      Florita Nitsch L Ysmael Hires, PA-C 10/06/23  ADDENDUM: Hematology/Oncology Attending: I had a face-to-face encounter with the patient today.  I reviewed her record, lab, and recommended her care plan.  This is a 82 years old white female with a stage IV non-small cell lung cancer, adenocarcinoma diagnosed in September 2023 with positive KRAS G12C mutation.  She is status post SRS to metastatic brain lesion followed by craniotomy and resection of the tumor as well as palliative radiation to the chest in November 2023.  The patient started systemic chemotherapy initially with carboplatin, Alimta and Keytruda for 4 cycles and starting from cycle #5 she has been on maintenance treatment with Alimta and Keytruda every 3 weeks status post 15 cycles.  Her dose of Alimta was reduced to 400 Mg/M2 recently secondary to intolerance.  The patient presented to the clinic today complaining of significant fatigue and weakness as well as shortness of breath.  She has been admitted to the hospital several times recently for COPD exacerbation.  She was supposed to start cycle #16 of her treatment today but she called earlier and canceled her infusion.  She is followed by Dr. Craige Cotta with [facility].  She has been on  treatment with prednisone 10 mg p.o. daily for COPD in addition to inhaler and nebulizer.  Her oxygen saturation in the clinic was 92% on room air but the patient continues to  complain of significant fatigue and weakness as well as shortness of breath. I recommended for the patient to hold her treatment with chemotherapy today. I contacted her pulmonologist Dr. Craige Cotta who kindly agreed to see her today for reevaluation of her condition and adjustment of her breathing medications. The patient will come back for follow-up visit next week for evaluation before resuming her treatment. She was advised to call immediately if she has any other concerning symptoms in the interval. The total time spent in the appointment was 30 minutes. Disclaimer: This note was dictated with voice recognition software. Similar sounding words can inadvertently be transcribed and may be missed upon review. Lajuana Matte, MD

## 2023-10-06 ENCOUNTER — Inpatient Hospital Stay: Payer: Medicare Other

## 2023-10-06 ENCOUNTER — Inpatient Hospital Stay (HOSPITAL_BASED_OUTPATIENT_CLINIC_OR_DEPARTMENT_OTHER): Payer: Medicare Other | Admitting: Physician Assistant

## 2023-10-06 VITALS — BP 135/72 | HR 100 | Temp 97.5°F | Resp 18

## 2023-10-06 DIAGNOSIS — R0602 Shortness of breath: Secondary | ICD-10-CM

## 2023-10-06 DIAGNOSIS — C3490 Malignant neoplasm of unspecified part of unspecified bronchus or lung: Secondary | ICD-10-CM | POA: Diagnosis not present

## 2023-10-06 DIAGNOSIS — Z95828 Presence of other vascular implants and grafts: Secondary | ICD-10-CM

## 2023-10-06 DIAGNOSIS — Z5112 Encounter for antineoplastic immunotherapy: Secondary | ICD-10-CM | POA: Diagnosis not present

## 2023-10-06 LAB — CMP (CANCER CENTER ONLY)
ALT: 10 U/L (ref 0–44)
AST: 14 U/L — ABNORMAL LOW (ref 15–41)
Albumin: 3.8 g/dL (ref 3.5–5.0)
Alkaline Phosphatase: 72 U/L (ref 38–126)
Anion gap: 6 (ref 5–15)
BUN: 12 mg/dL (ref 8–23)
CO2: 30 mmol/L (ref 22–32)
Calcium: 9 mg/dL (ref 8.9–10.3)
Chloride: 101 mmol/L (ref 98–111)
Creatinine: 0.55 mg/dL (ref 0.44–1.00)
GFR, Estimated: >60 mL/min (ref >60)
Glucose, Bld: 125 mg/dL — ABNORMAL HIGH (ref 70–99)
Potassium: 4.2 mmol/L (ref 3.5–5.1)
Sodium: 137 mmol/L (ref 135–145)
Total Bilirubin: 0.4 mg/dL (ref 0.0–1.2)
Total Protein: 6.5 g/dL (ref 6.5–8.1)

## 2023-10-06 LAB — CBC WITH DIFFERENTIAL (CANCER CENTER ONLY)
Abs Immature Granulocytes: 0.01 10*3/uL (ref 0.00–0.07)
Basophils Absolute: 0.1 10*3/uL (ref 0.0–0.1)
Basophils Relative: 1 %
Eosinophils Absolute: 0 10*3/uL (ref 0.0–0.5)
Eosinophils Relative: 1 %
HCT: 36.4 % (ref 36.0–46.0)
Hemoglobin: 11.9 g/dL — ABNORMAL LOW (ref 12.0–15.0)
Immature Granulocytes: 0 %
Lymphocytes Relative: 17 %
Lymphs Abs: 0.6 10*3/uL — ABNORMAL LOW (ref 0.7–4.0)
MCH: 31.6 pg (ref 26.0–34.0)
MCHC: 32.7 g/dL (ref 30.0–36.0)
MCV: 96.6 fL (ref 80.0–100.0)
Monocytes Absolute: 0.5 10*3/uL (ref 0.1–1.0)
Monocytes Relative: 13 %
Neutro Abs: 2.4 10*3/uL (ref 1.7–7.7)
Neutrophils Relative %: 68 %
Platelet Count: 213 10*3/uL (ref 150–400)
RBC: 3.77 MIL/uL — ABNORMAL LOW (ref 3.87–5.11)
RDW: 13.6 % (ref 11.5–15.5)
WBC Count: 3.7 10*3/uL — ABNORMAL LOW (ref 4.0–10.5)
nRBC: 0 % (ref 0.0–0.2)

## 2023-10-06 MED ORDER — SODIUM CHLORIDE 0.9% FLUSH
10.0000 mL | Freq: Once | INTRAVENOUS | Status: AC
  Administered 2023-10-06: 10 mL

## 2023-10-06 MED ORDER — HEPARIN SOD (PORK) LOCK FLUSH 100 UNIT/ML IV SOLN: 500.0000 [IU] | Freq: Once | INTRAVENOUS | Status: DC

## 2023-10-06 NOTE — Progress Notes (Signed)
Pt. Here for port flush/labs.  C/O SOB, pain in lungs and feels really bad.  Here to see Casandra/PA

## 2023-10-09 ENCOUNTER — Other Ambulatory Visit: Payer: Medicare Other

## 2023-10-09 ENCOUNTER — Ambulatory Visit: Payer: Medicare Other | Admitting: Physician Assistant

## 2023-10-09 ENCOUNTER — Ambulatory Visit: Payer: Medicare Other

## 2023-10-15 ENCOUNTER — Inpatient Hospital Stay: Payer: Medicare Other | Attending: Internal Medicine

## 2023-10-15 ENCOUNTER — Inpatient Hospital Stay: Payer: Medicare Other

## 2023-10-15 VITALS — BP 140/60 | HR 78 | Temp 98.3°F | Resp 17 | Wt 195.0 lb

## 2023-10-15 DIAGNOSIS — Z79631 Long term (current) use of antimetabolite agent: Secondary | ICD-10-CM | POA: Diagnosis not present

## 2023-10-15 DIAGNOSIS — Z79620 Long term (current) use of immunosuppressive biologic: Secondary | ICD-10-CM | POA: Diagnosis not present

## 2023-10-15 DIAGNOSIS — C3490 Malignant neoplasm of unspecified part of unspecified bronchus or lung: Secondary | ICD-10-CM

## 2023-10-15 DIAGNOSIS — C3412 Malignant neoplasm of upper lobe, left bronchus or lung: Secondary | ICD-10-CM | POA: Diagnosis present

## 2023-10-15 DIAGNOSIS — Z5112 Encounter for antineoplastic immunotherapy: Secondary | ICD-10-CM | POA: Diagnosis present

## 2023-10-15 DIAGNOSIS — C7931 Secondary malignant neoplasm of brain: Secondary | ICD-10-CM | POA: Diagnosis not present

## 2023-10-15 DIAGNOSIS — Z95828 Presence of other vascular implants and grafts: Secondary | ICD-10-CM

## 2023-10-15 DIAGNOSIS — C778 Secondary and unspecified malignant neoplasm of lymph nodes of multiple regions: Secondary | ICD-10-CM | POA: Insufficient documentation

## 2023-10-15 DIAGNOSIS — Z5111 Encounter for antineoplastic chemotherapy: Secondary | ICD-10-CM | POA: Diagnosis present

## 2023-10-15 LAB — CMP (CANCER CENTER ONLY)
ALT: 10 U/L (ref 0–44)
AST: 11 U/L — ABNORMAL LOW (ref 15–41)
Albumin: 3.8 g/dL (ref 3.5–5.0)
Alkaline Phosphatase: 71 U/L (ref 38–126)
Anion gap: 6 (ref 5–15)
BUN: 17 mg/dL (ref 8–23)
CO2: 30 mmol/L (ref 22–32)
Calcium: 8.9 mg/dL (ref 8.9–10.3)
Chloride: 100 mmol/L (ref 98–111)
Creatinine: 0.57 mg/dL (ref 0.44–1.00)
GFR, Estimated: >60 mL/min (ref >60)
Glucose, Bld: 116 mg/dL — ABNORMAL HIGH (ref 70–99)
Potassium: 3.9 mmol/L (ref 3.5–5.1)
Sodium: 136 mmol/L (ref 135–145)
Total Bilirubin: 0.4 mg/dL (ref 0.0–1.2)
Total Protein: 6.3 g/dL — ABNORMAL LOW (ref 6.5–8.1)

## 2023-10-15 LAB — CBC WITH DIFFERENTIAL (CANCER CENTER ONLY)
Abs Immature Granulocytes: 0.01 10*3/uL (ref 0.00–0.07)
Basophils Absolute: 0.1 10*3/uL (ref 0.0–0.1)
Basophils Relative: 1 %
Eosinophils Absolute: 0.1 10*3/uL (ref 0.0–0.5)
Eosinophils Relative: 1 %
HCT: 37.3 % (ref 36.0–46.0)
Hemoglobin: 12.3 g/dL (ref 12.0–15.0)
Immature Granulocytes: 0 %
Lymphocytes Relative: 19 %
Lymphs Abs: 1.2 10*3/uL (ref 0.7–4.0)
MCH: 31.7 pg (ref 26.0–34.0)
MCHC: 33 g/dL (ref 30.0–36.0)
MCV: 96.1 fL (ref 80.0–100.0)
Monocytes Absolute: 0.7 10*3/uL (ref 0.1–1.0)
Monocytes Relative: 10 %
Neutro Abs: 4.4 10*3/uL (ref 1.7–7.7)
Neutrophils Relative %: 69 %
Platelet Count: 211 10*3/uL (ref 150–400)
RBC: 3.88 MIL/uL (ref 3.87–5.11)
RDW: 13.6 % (ref 11.5–15.5)
WBC Count: 6.4 10*3/uL (ref 4.0–10.5)
nRBC: 0 % (ref 0.0–0.2)

## 2023-10-15 MED ORDER — SODIUM CHLORIDE 0.9 % IV SOLN: Freq: Once | INTRAVENOUS | Status: AC

## 2023-10-15 MED ORDER — HEPARIN SOD (PORK) LOCK FLUSH 100 UNIT/ML IV SOLN
500.0000 [IU] | Freq: Once | INTRAVENOUS | Status: AC | PRN
Start: 2023-10-15 — End: 2023-10-15
  Administered 2023-10-15: 500 [IU]

## 2023-10-15 MED ORDER — PEMBROLIZUMAB CHEMO INJECTION 100 MG/4ML
200.0000 mg | Freq: Once | INTRAVENOUS | Status: AC
  Administered 2023-10-15: 200 mg via INTRAVENOUS
  Filled 2023-10-15: qty 200

## 2023-10-15 MED ORDER — SODIUM CHLORIDE 0.9% FLUSH
10.0000 mL | INTRAVENOUS | Status: DC | PRN
  Administered 2023-10-15: 10 mL

## 2023-10-15 MED ORDER — SODIUM CHLORIDE 0.9% FLUSH
10.0000 mL | Freq: Once | INTRAVENOUS | Status: AC
  Administered 2023-10-15: 10 mL

## 2023-10-15 MED ORDER — PEMETREXED DISODIUM CHEMO INJECTION 500 MG
400.0000 mg/m2 | Freq: Once | INTRAVENOUS | Status: AC
  Administered 2023-10-15: 800 mg via INTRAVENOUS
  Filled 2023-10-15: qty 20

## 2023-10-15 MED ORDER — ONDANSETRON HCL 4 MG/2ML IJ SOLN
8.0000 mg | Freq: Once | INTRAMUSCULAR | Status: AC
  Administered 2023-10-15: 8 mg via INTRAVENOUS
  Filled 2023-10-15: qty 4

## 2023-10-15 NOTE — Patient Instructions (Signed)
 [facility] - A DEPT OF [facility]  Discharge Instructions: Thank you for choosing [facility] to provide your oncology and hematology care.   If you have a lab appointment with the Cancer Center, please go directly to the Cancer Center and check in at the registration area.   Wear comfortable clothing and clothing appropriate for easy access to any Portacath or PICC line.   We strive to give you quality time with your provider. You may need to reschedule your appointment if you arrive late (15 or more minutes).  Arriving late affects you and other patients whose appointments are after yours.  Also, if you miss three or more appointments without notifying the office, you may be dismissed from the clinic at the provider's discretion.      For prescription refill requests, have your pharmacy contact our office and allow 72 hours for refills to be completed.    Today you received the following chemotherapy and/or immunotherapy agents: Keytruda, Alimta.       To help prevent nausea and vomiting after your treatment, we encourage you to take your nausea medication as directed.  BELOW ARE SYMPTOMS THAT SHOULD BE REPORTED IMMEDIATELY: *FEVER GREATER THAN 100.4 F (38 C) OR HIGHER *CHILLS OR SWEATING *NAUSEA AND VOMITING THAT IS NOT CONTROLLED WITH YOUR NAUSEA MEDICATION *UNUSUAL SHORTNESS OF BREATH *UNUSUAL BRUISING OR BLEEDING *URINARY PROBLEMS (pain or burning when urinating, or frequent urination) *BOWEL PROBLEMS (unusual diarrhea, constipation, pain near the anus) TENDERNESS IN MOUTH AND THROAT WITH OR WITHOUT PRESENCE OF ULCERS (sore throat, sores in mouth, or a toothache) UNUSUAL RASH, SWELLING OR PAIN  UNUSUAL VAGINAL DISCHARGE OR ITCHING   Items with * indicate a potential emergency and should be followed up as soon as possible or go to the Emergency Department if any problems should occur.  Please show the CHEMOTHERAPY ALERT CARD or  IMMUNOTHERAPY ALERT CARD at check-in to the Emergency Department and triage nurse.  Should you have questions after your visit or need to cancel or reschedule your appointment, please contact [facility] - A DEPT OF [facility]  Dept: [phone]  and follow the prompts.  Office hours are 8:00 a.m. to 4:30 p.m. Monday - Friday. Please note that voicemails left after 4:00 p.m. may not be returned until the following business day.  We are closed weekends and major holidays. You have access to a nurse at all times for urgent questions. Please call the main number to the clinic Dept: [phone] and follow the prompts.   For any non-urgent questions, you may also contact your provider using MyChart. We now offer e-Visits for anyone 61 and older to request care online for non-urgent symptoms. For details visit [email or web address].   Also download the MyChart app! Go to the app store, search "MyChart", open the app, select , and log in with your MyChart username and password.

## 2023-10-27 ENCOUNTER — Ambulatory Visit: Payer: Medicare Other | Admitting: Internal Medicine

## 2023-10-27 ENCOUNTER — Ambulatory Visit: Payer: Medicare Other

## 2023-10-27 ENCOUNTER — Other Ambulatory Visit (HOSPITAL_COMMUNITY): Payer: Self-pay | Admitting: Neurological Surgery

## 2023-10-27 ENCOUNTER — Other Ambulatory Visit: Payer: Medicare Other

## 2023-10-27 DIAGNOSIS — C7931 Secondary malignant neoplasm of brain: Secondary | ICD-10-CM

## 2023-10-30 ENCOUNTER — Encounter: Payer: Self-pay | Admitting: Internal Medicine

## 2023-10-30 ENCOUNTER — Other Ambulatory Visit: Payer: Self-pay | Admitting: Physician Assistant

## 2023-10-30 ENCOUNTER — Telehealth: Payer: Self-pay | Admitting: Hematology

## 2023-11-03 ENCOUNTER — Other Ambulatory Visit: Payer: Self-pay | Admitting: Radiation Therapy

## 2023-11-04 ENCOUNTER — Other Ambulatory Visit: Payer: Self-pay | Admitting: Radiation Therapy

## 2023-11-05 ENCOUNTER — Emergency Department (HOSPITAL_COMMUNITY): Payer: Medicare Other

## 2023-11-05 ENCOUNTER — Telehealth: Payer: Self-pay | Admitting: *Deleted

## 2023-11-05 ENCOUNTER — Inpatient Hospital Stay: Payer: Medicare Other

## 2023-11-05 ENCOUNTER — Emergency Department (HOSPITAL_COMMUNITY)
Admission: EM | Admit: 2023-11-05 | Discharge: 2023-11-05 | Disposition: A | Discharge status: Home / Self Care | Payer: Medicare Other | Attending: Emergency Medicine | Admitting: Emergency Medicine

## 2023-11-05 ENCOUNTER — Inpatient Hospital Stay: Payer: Medicare Other | Admitting: Internal Medicine

## 2023-11-05 ENCOUNTER — Encounter: Payer: Self-pay | Admitting: Internal Medicine

## 2023-11-05 ENCOUNTER — Other Ambulatory Visit: Payer: Self-pay

## 2023-11-05 DIAGNOSIS — Z85841 Personal history of malignant neoplasm of brain: Secondary | ICD-10-CM | POA: Insufficient documentation

## 2023-11-05 DIAGNOSIS — J449 Chronic obstructive pulmonary disease, unspecified: Secondary | ICD-10-CM | POA: Diagnosis not present

## 2023-11-05 DIAGNOSIS — R0789 Other chest pain: Secondary | ICD-10-CM | POA: Insufficient documentation

## 2023-11-05 DIAGNOSIS — I2510 Atherosclerotic heart disease of native coronary artery without angina pectoris: Secondary | ICD-10-CM | POA: Diagnosis not present

## 2023-11-05 DIAGNOSIS — Z7901 Long term (current) use of anticoagulants: Secondary | ICD-10-CM | POA: Diagnosis not present

## 2023-11-05 DIAGNOSIS — I10 Essential (primary) hypertension: Secondary | ICD-10-CM | POA: Insufficient documentation

## 2023-11-05 DIAGNOSIS — Z85118 Personal history of other malignant neoplasm of bronchus and lung: Secondary | ICD-10-CM | POA: Insufficient documentation

## 2023-11-05 LAB — CBC
HCT: 38.1 % (ref 36.0–46.0)
Hemoglobin: 12.2 g/dL (ref 12.0–15.0)
MCH: 31.9 pg (ref 26.0–34.0)
MCHC: 32 g/dL (ref 30.0–36.0)
MCV: 99.5 fL (ref 80.0–100.0)
Platelets: 209 10*3/uL (ref 150–400)
RBC: 3.83 MIL/uL — ABNORMAL LOW (ref 3.87–5.11)
RDW: 13.9 % (ref 11.5–15.5)
WBC: 4.4 10*3/uL (ref 4.0–10.5)
nRBC: 0 % (ref 0.0–0.2)

## 2023-11-05 LAB — BASIC METABOLIC PANEL
Anion gap: 9 (ref 5–15)
BUN: 15 mg/dL (ref 8–23)
CO2: 26 mmol/L (ref 22–32)
Calcium: 8.9 mg/dL (ref 8.9–10.3)
Chloride: 100 mmol/L (ref 98–111)
Creatinine, Ser: 0.63 mg/dL (ref 0.44–1.00)
GFR, Estimated: >60 mL/min (ref >60)
Glucose, Bld: 108 mg/dL — ABNORMAL HIGH (ref 70–99)
Potassium: 4.6 mmol/L (ref 3.5–5.1)
Sodium: 135 mmol/L (ref 135–145)

## 2023-11-05 LAB — TROPONIN I (HIGH SENSITIVITY): Troponin I (High Sensitivity): 7 ng/L (ref <18)

## 2023-11-05 MED ORDER — METHOCARBAMOL 500 MG PO TABS: 500.0000 mg | ORAL_TABLET | Freq: Three times a day (TID) | ORAL | 0 refills | Status: AC | PRN

## 2023-11-05 MED ORDER — METHOCARBAMOL 500 MG PO TABS
500.0000 mg | ORAL_TABLET | Freq: Once | ORAL | Status: AC
Start: 2023-11-05 — End: 2023-11-05
  Administered 2023-11-05: 500 mg via ORAL
  Filled 2023-11-05: qty 1

## 2023-11-05 MED ORDER — IOHEXOL 350 MG/ML SOLN
100.0000 mL | Freq: Once | INTRAVENOUS | Status: AC | PRN
  Administered 2023-11-05: 100 mL via INTRAVENOUS

## 2023-11-05 NOTE — Telephone Encounter (Signed)
Telephone call to patient related to MyChart message stating she is having chest pain. Patient states she has been having this for a week. She is more short of breath and it increases in intensity with movement causing her to cough. Patient was advised to go to the ED. Patient declined. She is requesting a chest xray prior to her appointment today. She has to come into the office and have her infusion. She does not want to go to the ED and spend all day. She feels this is not her heart. This RN tried to encourage her to go to the ED. Patient again refused asking just to have a CXR.  Dr. Arbutus Ped advised of symptoms and agrees patient needs to go to ED. Call to patient returned and patient agrees with plan to go to the ED per Dr. Arbutus Ped.

## 2023-11-05 NOTE — ED Provider Triage Note (Signed)
Emergency Medicine Provider Triage Evaluation Note  Laurie Mccoy , a 83 y.o. female  was evaluated in triage.  Pt complains of chest pain.  Patient reports worsening chest pain that radiates to her back.  Also endorses shortness of breath.  Review of Systems  Positive: Chest pain, SOB, back pain Negative: Abdominal pain  Physical Exam  BP (!) 143/74 (BP Location: Right Arm)   Pulse (!) 104   Temp 98 F (36.7 C) (Oral)   Resp 14   Ht 5' 6.5" (1.689 m)   Wt 88 kg   SpO2 95%   BMI 30.84 kg/m  Gen:   Awake, no distress   Resp:  Normal effort  MSK:   Moves extremities without difficulty  Other:  oxygen saturation 92% on room air.  3 L supplemental O2 given - which patient uses at home  Medical Decision Making  Medically screening exam initiated at 1:41 PM.  Appropriate orders placed.  Laurie Mccoy was informed that the remainder of the evaluation will be completed by another provider, this initial triage assessment does not replace that evaluation, and the importance of remaining in the ED until their evaluation is complete.    Maxwell Marion, PA-C 11/05/23 1343

## 2023-11-05 NOTE — Discharge Instructions (Addendum)
If you feel like muscle relaxer helped, there was a prescription for this medication sent to your pharmacy.  Take as needed.  Continue your other home pain medications as needed.  Utilize heat pads and stretching.  Return to the emergency department for any new or worsening symptoms of concern.

## 2023-11-05 NOTE — ED Triage Notes (Signed)
Patient to ED by POV with c/o chest pain. States she recently seen her MD who thought it was a muscular issue but pain is not going away. It radiates to back and right chest area.

## 2023-11-05 NOTE — ED Provider Notes (Signed)
[place] EMERGENCY DEPARTMENT AT [facility] Provider Note   CSN: [id number] Arrival date & time: 11/05/23  1257     History  Chief Complaint  Patient presents with   Chest Pain    Laurie Mccoy is a 83 y.o. female.   Chest Pain Associated symptoms: back pain   Patient presents for chest and back pain.  Medical history includes COPD, HTN, OSA, lung cancer with brain metastases, CAD, HLD, anxiety.  For her cancer, she has undergone radiation and chemotherapy.  She is undergoing palliative chemotherapy and immunotherapy.  Her cardiologist is Dr. Flora Lipps.  A week ago, she had an episode of coughing.  She has since had pain in area of central chest radiating around right side to back.  Pain is worsened with movements and coughing.  She took 1/2 tablet of Percocet today and has had relief from that.  She continues to have pain with movement.     Home Medications Prior to Admission medications   Medication Sig Start Date End Date Taking? Authorizing Provider  methocarbamol (ROBAXIN) 500 MG tablet Take 1 tablet (500 mg total) by mouth every 8 (eight) hours as needed for muscle spasms. 11/05/23  Yes Gloris Manchester, MD  albuterol (VENTOLIN HFA) 108 (90 Base) MCG/ACT inhaler Inhale 2 puffs into the lungs every 6 (six) hours as needed for wheezing or shortness of breath. 06/29/23   Jaci Standard, MD  ALPRAZolam Prudy Feeler) 0.25 MG tablet Take 0.25 mg by mouth at bedtime as needed for anxiety.    [provider]  amoxicillin-clavulanate (AUGMENTIN) 875-125 MG tablet Take 1 tablet by mouth 2 (two) times daily. Patient not taking: Reported on 08/27/2023 08/12/23   Heilingoetter, Cassandra L, PA-C  Budeson-Glycopyrrol-Formoterol (BREZTRI AEROSPHERE) 160-9-4.8 MCG/ACT AERO INHALE 2 PUFFS BY MOUTH EVERY MORNING AND EVERY NIGHT AT BEDTIME Patient taking differently: Inhale 2 puffs into the lungs in the morning and at bedtime. 05/09/22   Nyoka Cowden, MD  ELIQUIS 5 MG TABS tablet  Take 1 tablet (5 mg total) by mouth 2 (two) times daily. 09/02/23   Heilingoetter, Cassandra L, PA-C  folic acid (FOLVITE) 1 MG tablet TAKE ONE TABLET BY MOUTH EVERY DAY 08/12/23   Heilingoetter, Cassandra L, PA-C  furosemide (LASIX) 20 MG tablet 1 tablet p.o. daily as needed for increased leg swelling. 02/05/23   Si Gaul, MD  gabapentin (NEURONTIN) 100 MG capsule Take 1 capsule (100 mg total) by mouth 2 (two) times daily. Patient not taking: Reported on 08/27/2023 11/05/22   Heilingoetter, Cassandra L, PA-C  hydrOXYzine (ATARAX) 10 MG tablet Take 1 tablet (10 mg total) by mouth 3 (three) times daily as needed. 03/25/23   Heilingoetter, Cassandra L, PA-C  ipratropium-albuterol (DUONEB) 0.5-2.5 (3) MG/3ML SOLN Take 3 mLs by nebulization every 6 (six) hours as needed. 08/27/23 09/26/23  Dolphus Jenny, PA-C  levocetirizine (XYZAL) 5 MG tablet Take 5 mg by mouth at bedtime as needed (for seasonal allergies- if not taking generic Claritin).    [provider]  lidocaine-prilocaine (EMLA) cream Apply 1 Application topically as needed. 03/25/23   Heilingoetter, Cassandra L, PA-C  LORazepam (ATIVAN) 1 MG tablet Take 1 tablet (1 mg total) by mouth as needed. 30 min Prior to MRI or brain radiation procedure 04/25/23   Lonie Peak, MD  ondansetron (ZOFRAN) 8 MG tablet Take 1 tablet (8 mg total) by mouth every 8 (eight) hours as needed for nausea or vomiting. 07/22/22   Heilingoetter, Cassandra L, PA-C  oxyCODONE (OXY  IR/ROXICODONE) 5 MG immediate release tablet Take 1 tablet (5 mg total) by mouth every 4 (four) hours as needed for severe pain. 04/09/23   Jeannie Fend, PA-C  pantoprazole (PROTONIX) 20 MG tablet Take 20 mg by mouth every evening. 07/19/22   [provider]  predniSONE (DELTASONE) 10 MG tablet Take 2 tablets (20 mg total) by mouth daily. Patient taking differently: Take 20 mg by mouth as needed. 08/27/23   Dolphus Jenny, PA-C  telmisartan (MICARDIS) 40 MG tablet Take 1  tablet (40 mg total) by mouth daily. 09/25/23   Azalee Course, PA  traMADol (ULTRAM) 50 MG tablet Take 2 tablets (100 mg total) by mouth every 6 (six) hours as needed for severe pain. Patient taking differently: Take 50 mg by mouth every 6 (six) hours as needed for severe pain (pain score 7-10). 07/13/22   Dawley, Troy C, DO  Zinc Acetate, Oral, (ZINC ACETATE PO) Take 1 tablet by mouth daily. Received from [employer], Historical, MD      Allergies    Compazine [prochlorperazine], Norvasc [amlodipine], and Trelegy ellipta [fluticasone-umeclidin-vilant]    Review of Systems   Review of Systems  Cardiovascular:  Positive for chest pain.  Musculoskeletal:  Positive for back pain.  All other systems reviewed and are negative.   Physical Exam Updated Vital Signs BP (!) 151/72 (BP Location: Left Arm)   Pulse (!) 109   Temp 98 F (36.7 C) (Oral)   Resp 16   Ht 5' 6.5" (1.689 m)   Wt 88 kg   SpO2 94%   BMI 30.84 kg/m  Physical Exam Vitals and nursing note reviewed.  Constitutional:      General: She is not in acute distress.    Appearance: She is well-developed. She is not ill-appearing, toxic-appearing or diaphoretic.  HENT:     Head: Normocephalic and atraumatic.  Eyes:     Conjunctiva/sclera: Conjunctivae normal.  Cardiovascular:     Rate and Rhythm: Normal rate and regular rhythm.  Pulmonary:     Effort: Pulmonary effort is normal. No tachypnea.  Chest:     Chest wall: No tenderness.  Abdominal:     Palpations: Abdomen is soft.     Tenderness: There is no abdominal tenderness.  Musculoskeletal:        General: No swelling. Normal range of motion.     Cervical back: Normal range of motion and neck supple.  Skin:    General: Skin is warm and dry.     Coloration: Skin is not cyanotic or pale.  Neurological:     General: No focal deficit present.     Mental Status: She is alert and oriented to person, place, and time.  Psychiatric:        Mood and Affect: Mood  normal.        Behavior: Behavior normal.     ED Results / Procedures / Treatments   Labs (all labs ordered are listed, but only abnormal results are displayed) Labs Reviewed  BASIC METABOLIC PANEL - Abnormal; Notable for the following components:      Result Value   Glucose, Bld 108 (*)    All other components within normal limits  CBC - Abnormal; Notable for the following components:   RBC 3.83 (*)    All other components within normal limits  TROPONIN I (HIGH SENSITIVITY)    EKG EKG Interpretation Date/Time:  Wednesday November 05 2023 13:06:34 EST Ventricular Rate:  103 PR Interval:  140 QRS Duration:  105 QT Interval:  324 QTC Calculation: 425 R Axis:   4  Text Interpretation: Sinus tachycardia Low voltage, extremity and precordial leads Confirmed by Gloris Manchester [phone]) on 11/05/2023 5:54:18 PM  Radiology CT Angio Chest/Abd/Pel for Dissection W and/or Wo Contrast Result Date: 11/05/2023 CLINICAL DATA:  Acute aortic syndrome (AAS) suspected. Chest pain radiating to the back shortness of breath. History of non-small cell lung cancer. EXAM: CT ANGIOGRAPHY CHEST, ABDOMEN AND PELVIS TECHNIQUE: Non-contrast CT of the chest was initially obtained. Multidetector CT imaging through the chest, abdomen and pelvis was performed using the standard protocol during bolus administration of intravenous contrast. Multiplanar reconstructed images and MIPs were obtained and reviewed to evaluate the vascular anatomy. RADIATION DOSE REDUCTION: This exam was performed according to the departmental dose-optimization program which includes automated exposure control, adjustment of the mA and/or kV according to patient size and/or use of iterative reconstruction technique. CONTRAST:  OMNIPAQUE IOHEXOL 350 MG/ML SOLN COMPARISON:  CTA chest dated August 27, 2023. CT chest, abdomen, and pelvis dated June 23, 2023. FINDINGS: CTA CHEST FINDINGS Cardiovascular: Preferential opacification of the  thoracic aorta. No evidence of thoracic aortic aneurysm or dissection. No evidence of pulmonary embolism to the segmental level. Normal heart size. No pericardial effusion. Aortic atherosclerosis. Mediastinum/Nodes: No enlarged mediastinal, hilar, or axillary lymph nodes. Unchanged 2.7 cm hypoattenuating left thyroid nodule, previously evaluated. The trachea and esophagus demonstrate no significant findings. Lungs/Pleura: Moderate left pleural effusion, not significantly changed compared to the prior exam. Similar posttreatment consolidative changes and scarring again noted in the left upper lobe. Fiducial marker again noted in the right lung with surrounding similar mild ground-glass change. No new suspicious pulmonary nodule. No pneumothorax. Mild centrilobular emphysema. Musculoskeletal: No chest wall abnormality. No acute or significant osseous findings. Review of the MIP images confirms the above findings. CTA ABDOMEN AND PELVIS FINDINGS VASCULAR Aorta: Normal caliber aorta without aneurysm, dissection, or significant stenosis. Severe aortic atherosclerosis. Celiac: Patent without evidence of aneurysm or dissection. Atherosclerotic calcification at the origin of the celiac artery without evidence of significant stenosis. SMA: Patent without evidence of aneurysm or dissection. Atherosclerotic calcification at the origin of the SMA contributes to mild-to-moderate stenosis. Renals: Both renal arteries are patent without evidence of aneurysm, dissection, or significant stenosis. IMA: Patent without evidence of aneurysm, dissection, or significant stenosis. Inflow: Patent without evidence of aneurysm, dissection, or significant stenosis. Moderate atherosclerotic calcification. Veins: No obvious venous abnormality within the limitations of this arterial phase study. Review of the MIP images confirms the above findings. NON-VASCULAR Hepatobiliary: No focal liver abnormality is seen. Status post cholecystectomy. No  biliary dilatation. Pancreas: Unremarkable. No pancreatic ductal dilatation or surrounding inflammatory changes. Spleen: Normal in size without focal abnormality. Adrenals/Urinary Tract: Adrenal glands are unremarkable. Stable interpolar left renal cyst, for which no follow-up imaging is recommended. No renal or ureteral calculi. No hydronephrosis. Bladder is unremarkable. Stomach/Bowel: Stomach is within normal limits. Status post appendectomy. There is a segment of nonobstructed transverse colon within a right paramidline ventral abdominal wall hernia. No surrounding fluid or stranding. Surgical sutures in the sigmoid. No evidence of obstruction. Lymphatic: No enlarged abdominopelvic lymph nodes. Reproductive: Retroverted uterus. Endometrial thickening is again noted measuring 20 mm. No adnexal mass. Other: No abdominopelvic ascites. Intraperitoneal free air. A more cephalad fat containing small right paramidline ventral abdominal wall hernia is again noted. Musculoskeletal: No acute osseous abnormality. No suspicious osseous lesion. Review of the MIP images confirms the above findings. IMPRESSION: 1. No evidence  of acute aortic syndrome. No evidence of thoracic aortic aneurysm or dissection. 2. Severe aortic atherosclerosis. Atherosclerotic calcification at the origin of the SMA contributes to mild-to-moderate stenosis. 3. Redemonstrated moderate left pleural effusion and post treatment changes in the left upper lobe, not significantly changed compared to the prior exam. 4. Similar ventral abdominal wall hernias containing nonobstructed transverse colon and fat. No associated fluid or inflammatory changes. 5. Chronic endometrial thickening. Recommend correlation with postmenopausal bleeding. Aortic Atherosclerosis (ICD10-I70.0) and Emphysema (ICD10-J43.9). Electronically Signed   By: Hart Robinsons M.D.   On: 11/05/2023 16:35   DG Chest 2 View Result Date: 11/05/2023 CLINICAL DATA:  Shortness of breath.   Chest pain. EXAM: CHEST - 2 VIEW COMPARISON:  08/27/2023. FINDINGS: Redemonstration of small-to-moderate left pleural effusion with associated underlying compressive atelectatic changes. There are heterogeneous opacities overlying the left mid lung zone, which appears slightly less pronounced than the prior exam. Bilateral lung fields are otherwise clear. Redemonstration of presumed fiducial marker/postbiopsy clip overlying the right mid lung zone. Right lateral costophrenic angle is clear. Stable cardio-mediastinal silhouette. No acute osseous abnormalities. The soft tissues are within normal limits. Right-sided CT Port-A-Cath is again seen with its tip overlying the cavoatrial junction region. IMPRESSION: *Redemonstration of small-to-moderate left pleural effusion. *Heterogeneous opacities overlying the left mid lung zone, slightly less pronounced than the prior exam. Electronically Signed   By: Jules Schick M.D.   On: 11/05/2023 14:59    Procedures Procedures    Medications Ordered in ED Medications  methocarbamol (ROBAXIN) tablet 500 mg (has no administration in time range)  iohexol (OMNIPAQUE) 350 MG/ML injection 100 mL (100 mLs Intravenous Contrast Given 11/05/23 1446)    ED Course/ Medical Decision Making/ A&P                                 Medical Decision Making Amount and/or Complexity of Data Reviewed Labs: ordered. Radiology: ordered.  Risk Prescription drug management.   This patient presents to the ED for concern of chest and back pain, this involves an extensive number of treatment options, and is a complaint that carries with it a high risk of complications and morbidity.  The differential diagnosis includes ACS, intercostal muscle inflammation, PE, pericarditis, dissection, pleural effusion   Co morbidities that complicate the patient evaluation  COPD, HTN, OSA, lung cancer with brain metastases, CAD, HLD, anxiety   Additional history obtained:  Additional  history obtained from N/A External records from outside source obtained and reviewed including EMR   Lab Tests:  I Ordered, and personally interpreted labs.  The pertinent results include: Normal hemoglobin, no leukocytosis, normal kidney function, normal electrolytes, normal troponin   Imaging Studies ordered:  I ordered imaging studies including x-ray, CTA dissection study I independently visualized and interpreted imaging which showed no acute findings to explain right-sided chest and back pain.  Chronic findings of atherosclerosis, low perfusion, endometrial thickening, ventral hernias I agree with the radiologist interpretation   Cardiac Monitoring: / EKG:  The patient was maintained on a cardiac monitor.  I personally viewed and interpreted the cardiac monitored which showed an underlying rhythm of: Sinus rhythm   Problem List / ED Course / Critical interventions / Medication management  Patient presents for 1 week of chest and back pain.  It is primarily right-sided.  It is worsened with coughing and movement.  Prior to being bedded in the ED, workup was initiated.  Workup results are  reassuring with no acute findings on CTA dissection study and normal troponin.  On exam, patient is well-appearing.  She states that she has had some relief with half tablet of Percocet that she took today.  She continues to have pain with movement.  Dose of Robaxin was provided.  History consistent with musculoskeletal etiology.  Given reassuring workup, patient is stable for discharge. I ordered medication including Robaxin for chest wall pain Reevaluation of the patient after these medicines showed that the patient improved I have reviewed the patients home medicines and have made adjustments as needed   Social Determinants of Health:  Has access to outpatient care        Final Clinical Impression(s) / ED Diagnoses Final diagnoses:  Chest wall pain    Rx / DC Orders ED Discharge  Orders          Ordered    methocarbamol (ROBAXIN) 500 MG tablet  Every 8 hours PRN        11/05/23 1755              Gloris Manchester, MD 11/05/23 1756

## 2023-11-06 ENCOUNTER — Telehealth: Payer: Self-pay | Admitting: Internal Medicine

## 2023-11-10 ENCOUNTER — Encounter: Payer: Self-pay | Admitting: Physician Assistant

## 2023-11-10 ENCOUNTER — Ambulatory Visit (HOSPITAL_COMMUNITY): Payer: Medicare Other

## 2023-11-10 ENCOUNTER — Encounter (HOSPITAL_COMMUNITY): Payer: Self-pay

## 2023-11-10 NOTE — Progress Notes (Signed)
 [facility] OFFICE PROGRESS NOTE  Laurie [name], NP [phone] [address] 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   Laurie Mccoy   Detected Alteration(s) / Biomarker(s)          Associated FDA-approved therapies  Clinical Trial Availability          % cfDNA or Amplification   KRAS G12C approved by FDA Adagrasib, Sotorasib Yes    1.7%   TP53 R249S None Yes          1.5%  PRIOR THERAPY: 1) SRS the the metastatic brain lesion under the care of Dr. Izell 2) Craniotomy on 07/10/22 under the care of Dr. Carollee 3) radiation to the chest under care of Dr. Shannon.  Last dose on 08/23/2022  CURRENT THERAPY: Systemic chemotherapy with carboplatin  for an AUC of 5, to 500 mg/m2, and Keytruda  200 mg IV every 3 weeks.  First dose expected on October 08, 2022. Starting from cycle #5 she will be on maintenance treatment with Alimta  and Keytruda  every 3 weeks. Status post 16 cycles. Will reduce her dose of alimta  to 400 mg/m2 secondary to intolerance   INTERVAL HISTORY: Laurie  Mccoy 83 y.o. female returns to the clinic today for a follow-up visit accompanied by her husband. She was supposed to come for treatment last week but she called reporting chest pain and shortness of breath. The patient has similar symptoms intermittently due to her COPD and several ER visits. She is followed by [facility].   She presented to the ER on 11/05/23, her CTA was negative for acute process, and her troponins were normal.  She had some relief with Percocet.  She was given Robaxin . It was felt that this is consistent with musculoskeletal etiology since this occurred after a coughing spell.   Today, she continues to have intermittent back  pain that comes and goes.  Her pain may occur especially if she is coughing. She denies a persistent cough but may have coughing now and then. She was previously prescribed tramadol  by one of her other providers and she states that she got that refilled recently for pain.  This is every 6 hours as needed for pain.  She also may take Tylenol  as well.  She has not tried using a heating pad or use any topical patches.  Overall she is feeling fair today.  Her son has bought her compression boots and she is wondering if she can use this. Additionally, there is a mobile IV company that administers IVF with various vitamins. She gave me a list to review to see if this is alright with her current treatment.    She denies any fever, chills, night sweats, unexplained weight loss.  She is on supplemental oxygen for her dyspnea on exertion.  I asked if she had ever been considered for pulmonary rehab although she does breathing exercises at home.  Denies any hemoptysis.  Denies any nausea, vomiting, or diarrhea.  She has been having some constipation recently.  Denies any rashes or skin changes.  Denies any headache or visual changes.  She is here today for evaluation and repeat blood work before undergoing cycle #17.   MEDICAL HISTORY: Past Medical History:  Diagnosis Date   Anxiety  had panic attacks years ago   Asthma    COPD (chronic obstructive pulmonary disease) (HCC)    Goiter 2022   History of hiatal hernia    dx in college, never bothered me   History of kidney stones 2012   History of radiation therapy    Left Lung-07/10/22-08/23/22- Dr. Lynwood Nasuti   Hypertension    Lung [facility])    Sleep apnea     ALLERGIES:  is allergic to compazine  [prochlorperazine ], norvasc  [amlodipine ], and trelegy ellipta  [fluticasone -umeclidin-vilant].  MEDICATIONS:  Current Outpatient Medications  Medication Sig Dispense Refill   albuterol  (VENTOLIN  HFA) 108 (90 Base) MCG/ACT inhaler Inhale 2 puffs  into the lungs every 6 (six) hours as needed for wheezing or shortness of breath. 18 g 0   ALPRAZolam  (XANAX ) 0.25 MG tablet Take 0.25 mg by mouth at bedtime as needed for anxiety.     amoxicillin -clavulanate (AUGMENTIN ) 875-125 MG tablet Take 1 tablet by mouth 2 (two) times daily. (Patient not taking: Reported on 08/27/2023) 14 tablet 0   Budeson-Glycopyrrol-Formoterol  (BREZTRI  AEROSPHERE) 160-9-4.8 MCG/ACT AERO INHALE 2 PUFFS BY MOUTH EVERY MORNING AND EVERY NIGHT AT BEDTIME (Patient taking differently: Inhale 2 puffs into the lungs in the morning and at bedtime.) 10.7 g 5   ELIQUIS  5 MG TABS tablet Take 1 tablet (5 mg total) by mouth 2 (two) times daily. 60 tablet 3   folic acid  (FOLVITE ) 1 MG tablet TAKE ONE TABLET BY MOUTH EVERY DAY 30 tablet 2   furosemide  (LASIX ) 20 MG tablet 1 tablet p.o. daily as needed for increased leg swelling. 20 tablet 0   gabapentin  (NEURONTIN ) 100 MG capsule Take 1 capsule (100 mg total) by mouth 2 (two) times daily. (Patient not taking: Reported on 08/27/2023) 90 capsule 2   hydrOXYzine  (ATARAX ) 10 MG tablet Take 1 tablet (10 mg total) by mouth 3 (three) times daily as needed. 60 tablet 2   ipratropium-albuterol  (DUONEB) 0.5-2.5 (3) MG/3ML SOLN Take 3 mLs by nebulization every 6 (six) hours as needed. 360 mL 0   levocetirizine (XYZAL) 5 MG tablet Take 5 mg by mouth at bedtime as needed (for seasonal allergies- if not taking generic Claritin ).     lidocaine -prilocaine  (EMLA ) cream Apply 1 Application topically as needed. 30 g 2   LORazepam  (ATIVAN ) 1 MG tablet Take 1 tablet (1 mg total) by mouth as needed. 30 min Prior to MRI or brain radiation procedure 5 tablet 0   methocarbamol  (ROBAXIN ) 500 MG tablet Take 1 tablet (500 mg total) by mouth every 8 (eight) hours as needed for muscle spasms. 20 tablet 0   ondansetron  (ZOFRAN ) 8 MG tablet Take 1 tablet (8 mg total) by mouth every 8 (eight) hours as needed for nausea or vomiting. 30 tablet 2   oxyCODONE  (OXY  IR/ROXICODONE ) 5 MG immediate release tablet Take 1 tablet (5 mg total) by mouth every 4 (four) hours as needed for severe pain. 12 tablet 0   pantoprazole  (PROTONIX ) 20 MG tablet Take 20 mg by mouth every evening.     predniSONE  (DELTASONE ) 10 MG tablet Take 2 tablets (20 mg total) by mouth daily. (Patient taking differently: Take 20 mg by mouth as needed.) 20 tablet 0   telmisartan  (MICARDIS ) 40 MG tablet Take 1 tablet (40 mg total) by mouth daily. 90 tablet 3   traMADol  (ULTRAM ) 50 MG tablet Take 2 tablets (100 mg total) by mouth every 6 (six) hours as needed for severe pain. (Patient taking differently: Take 50 mg by mouth  every 6 (six) hours as needed for severe pain (pain score 7-10).) 30 tablet 0   Zinc  Acetate, Oral, (ZINC  ACETATE PO) Take 1 tablet by mouth daily. Received from herbalist     No current facility-administered medications for this visit.   Facility-Administered Medications Ordered in Other Visits  Medication Dose Route Frequency Provider Last Rate Last Admin   heparin  lock flush 100 unit/mL  500 Units Intracatheter Once PRN Sherrod Sherrod, MD       oxyCODONE  (Oxy IR/ROXICODONE ) immediate release tablet 5 mg  5 mg Oral Once Quorra Rosene L, PA-C       pembrolizumab  (KEYTRUDA ) 200 mg in sodium chloride  0.9 % 50 mL chemo infusion  200 mg Intravenous Once Mohamed, Mohamed, MD 116 mL/hr at 11/12/23 1631 200 mg at 11/12/23 1631   PEMEtrexed  (ALIMTA ) 800 mg in sodium chloride  0.9 % 100 mL chemo infusion  400 mg/m2 (Treatment Plan Recorded) Intravenous Once Sherrod Sherrod, MD       sodium chloride  flush (NS) 0.9 % injection 10 mL  10 mL Intracatheter PRN Sherrod Sherrod, MD        SURGICAL HISTORY:  Past Surgical History:  Procedure Laterality Date   APPENDECTOMY     APPLICATION OF CRANIAL NAVIGATION N/A 07/11/2022   Procedure: APPLICATION OF CRANIAL NAVIGATION;  Surgeon: Carollee Lani BROCKS, DO;  Location: [facility] OR;  Service: Neurosurgery;  Laterality: N/A;   BRONCHIAL  BIOPSY  06/12/2021   Procedure: BRONCHIAL BIOPSIES;  Surgeon: Brenna Adine CROME, DO;  Location: [facility] ENDOSCOPY;  Service: Pulmonary;;   BRONCHIAL BRUSHINGS  06/12/2021   Procedure: BRONCHIAL BRUSHINGS;  Surgeon: Brenna Adine CROME, DO;  Location: [facility] ENDOSCOPY;  Service: Pulmonary;;   BRONCHIAL WASHINGS  06/12/2021   Procedure: BRONCHIAL WASHINGS;  Surgeon: Brenna Adine CROME, DO;  Location: [facility] ENDOSCOPY;  Service: Pulmonary;;   CHOLECYSTECTOMY     COLON RESECTION     CRANIOTOMY N/A 07/11/2022   Procedure: Craniotomy for resection of tumor;  Surgeon: Carollee Lani BROCKS, DO;  Location: [facility] OR;  Service: Neurosurgery;  Laterality: N/A;  RM 19   FIDUCIAL MARKER PLACEMENT  06/12/2021   Procedure: FIDUCIAL MARKER PLACEMENT;  Surgeon: Brenna Adine CROME, DO;  Location: [facility] ENDOS[facility];  Service: Pulmonary;;   hernia     x 2   IR IMAGING GUIDED PORT INSERTION  10/11/2022   TONSILLECTOMY     VIDEO BRONCHOSCOPY WITH ENDOBRONCHIAL NAVIGATION Bilateral 06/12/2021   Procedure: VIDEO BRONCHOSCOPY WITH ENDOBRONCHIAL NAVIGATION;  Surgeon: Brenna Adine CROME, DO;  Location: [facility] END[facility];  Service: Pulmonary;  Laterality: Bilateral;  ION   VIDEO BRONCHOSCOPY WITH RADIAL ENDOBRONCHIAL ULTRASOUND  06/12/2021   Procedure: RADIAL ENDOBRONCHIAL ULTRASOUND;  Surgeon: Brenna Adine CROME, DO;  Location: [facility] END[facility];  Service: Pulmonary;;    REVIEW OF SYSTEMS:   Constitutional: Positive for fatigue. Negative for appetite change, chills, fever and unexpected weight change.  HENT: Negative for mouth sores, nosebleeds, sore throat and trouble swallowing.   Eyes: Negative for eye problems and icterus.  Respiratory: Positive for stable dyspnea. Positive for intermittent cough. Negative for hemoptysis.  Cardiovascular: Negative for chest pain and edema (improved lower extremity swelling) Gastrointestinal: Negative for abdominal pain, constipation, diarrhea, nausea and vomiting.  Genitourinary: Negative for bladder incontinence, difficulty  urinating, dysuria, frequency and hematuria.   Musculoskeletal: Positive for central back pain with coughing. Negative for back pain, gait problem, neck pain and neck stiffness.  Skin: Negative for itching and rash.  Neurological: Negative for dizziness, extremity weakness, gait problem, headaches, light-headedness and seizures.  Hematological: Negative for adenopathy. Does not bruise/bleed easily.  Psychiatric/Behavioral: Negative for confusion, depression and sleep disturbance. The patient is not nervous/anxious.     PHYSICAL EXAMINATION:  Blood pressure (!) 147/57, pulse 75, temperature 97.8 F (36.6 C), temperature source Temporal, resp. rate 18, weight 197 lb 9.6 oz (89.6 kg), SpO2 94%.  ECOG PERFORMANCE STATUS: 2  Physical Exam  Constitutional: Oriented to person, place, and time and well-developed, well-nourished, and in no distress.  HENT:  Head: Normocephalic and atraumatic.  Mouth/Throat: Oropharynx is clear and moist. No oropharyngeal exudate.  Eyes: Conjunctivae are normal. Right eye exhibits no discharge. Left eye exhibits no discharge. No scleral icterus.  Neck: Normal range of motion. Neck supple.  Cardiovascular: Normal rate, regular rhythm, normal heart sounds and intact distal pulses.   Pulmonary/Chest: Effort normal and breath sounds normal. No respiratory distress. No wheezes. No rales. On supplemental oxygen  Abdominal: Soft. Bowel sounds are normal. Exhibits no distension and no mass. There is no tenderness.  Musculoskeletal: Normal range of motion. Exhibits no edema.  Lymphadenopathy:    No cervical adenopathy.  Neurological: Alert and oriented to person, place, and time. Neurological: Alert and oriented to person, place, and time. Exhibits also wasting.  Examined in the chair.  Skin: Skin is warm and dry. No rash noted. Not diaphoretic. No erythema. No pallor.  Psychiatric: Mood, memory and judgment normal.  Vitals reviewed.  LABORATORY DATA: Lab Results   Component Value Date   WBC 4.1 11/12/2023   HGB 11.8 (L) 11/12/2023   HCT 36.9 11/12/2023   MCV 98.9 11/12/2023   PLT 234 11/12/2023      Chemistry      Component Value Date/Time   NA 137 11/12/2023 1453   NA 139 09/17/2019 1550   K 4.1 11/12/2023 1453   CL 102 11/12/2023 1453   CO2 30 11/12/2023 1453   BUN 15 11/12/2023 1453   BUN 9 09/17/2019 1550   CREATININE 0.68 11/12/2023 1453      Component Value Date/Time   CALCIUM  8.9 11/12/2023 1453   ALKPHOS 74 11/12/2023 1453   AST 13 (L) 11/12/2023 1453   ALT 11 11/12/2023 1453   BILITOT 0.4 11/12/2023 1453       RADIOGRAPHIC STUDIES:  CT Angio Chest/Abd/Pel for Dissection W and/or Wo Contrast Result Date: 11/05/2023 CLINICAL DATA:  Acute aortic syndrome (AAS) suspected. Chest pain radiating to the back shortness of breath. History of non-small cell lung [facility]. EXAM: CT ANGIOGRAPHY CHEST, ABDOMEN AND PELVIS TECHNIQUE: Non-contrast CT of the chest was initially obtained. Multidetector CT imaging through the chest, abdomen and pelvis was performed using the standard protocol during bolus administration of intravenous contrast. Multiplanar reconstructed images and MIPs were obtained and reviewed to evaluate the vascular anatomy. RADIATION DOSE REDUCTION: This exam was performed according to the departmental dose-optimization program which includes automated exposure control, adjustment of the mA and/or kV according to patient size and/or use of iterative reconstruction technique. CONTRAST:  OMNIPAQUE  IOHEXOL  350 MG/ML SOLN COMPARISON:  CTA chest dated August 27, 2023. CT chest, abdomen, and pelvis dated June 23, 2023. FINDINGS: CTA CHEST FINDINGS Cardiovascular: Preferential opacification of the thoracic aorta. No evidence of thoracic aortic aneurysm or dissection. No evidence of pulmonary embolism to the segmental level. Normal heart size. No pericardial effusion. Aortic atherosclerosis. Mediastinum/Nodes: No enlarged  mediastinal, hilar, or axillary lymph nodes. Unchanged 2.7 cm hypoattenuating left thyroid  nodule, previously evaluated. The trachea and esophagus demonstrate no significant findings. Lungs/Pleura: Moderate left pleural effusion, not  significantly changed compared to the prior exam. Similar posttreatment consolidative changes and scarring again noted in the left upper lobe. Fiducial marker again noted in the right lung with surrounding similar mild ground-glass change. No new suspicious pulmonary nodule. No pneumothorax. Mild centrilobular emphysema. Musculoskeletal: No chest wall abnormality. No acute or significant osseous findings. Review of the MIP images confirms the above findings. CTA ABDOMEN AND PELVIS FINDINGS VASCULAR Aorta: Normal caliber aorta without aneurysm, dissection, or significant stenosis. Severe aortic atherosclerosis. Celiac: Patent without evidence of aneurysm or dissection. Atherosclerotic calcification at the origin of the celiac artery without evidence of significant stenosis. SMA: Patent without evidence of aneurysm or dissection. Atherosclerotic calcification at the origin of the SMA contributes to mild-to-moderate stenosis. Renals: Both renal arteries are patent without evidence of aneurysm, dissection, or significant stenosis. IMA: Patent without evidence of aneurysm, dissection, or significant stenosis. Inflow: Patent without evidence of aneurysm, dissection, or significant stenosis. Moderate atherosclerotic calcification. Veins: No obvious venous abnormality within the limitations of this arterial phase study. Review of the MIP images confirms the above findings. NON-VASCULAR Hepatobiliary: No focal liver abnormality is seen. Status post cholecystectomy. No biliary dilatation. Pancreas: Unremarkable. No pancreatic ductal dilatation or surrounding inflammatory changes. Spleen: Normal in size without focal abnormality. Adrenals/Urinary Tract: Adrenal glands are unremarkable. Stable  interpolar left renal cyst, for which no follow-up imaging is recommended. No renal or ureteral calculi. No hydronephrosis. Bladder is unremarkable. Stomach/Bowel: Stomach is within normal limits. Status post appendectomy. There is a segment of nonobstructed transverse colon within a right paramidline ventral abdominal wall hernia. No surrounding fluid or stranding. Surgical sutures in the sigmoid. No evidence of obstruction. Lymphatic: No enlarged abdominopelvic lymph nodes. Reproductive: Retroverted uterus. Endometrial thickening is again noted measuring 20 mm. No adnexal mass. Other: No abdominopelvic ascites. Intraperitoneal free air. A more cephalad fat containing small right paramidline ventral abdominal wall hernia is again noted. Musculoskeletal: No acute osseous abnormality. No suspicious osseous lesion. Review of the MIP images confirms the above findings. IMPRESSION: 1. No evidence of acute aortic syndrome. No evidence of thoracic aortic aneurysm or dissection. 2. Severe aortic atherosclerosis. Atherosclerotic calcification at the origin of the SMA contributes to mild-to-moderate stenosis. 3. Redemonstrated moderate left pleural effusion and post treatment changes in the left upper lobe, not significantly changed compared to the prior exam. 4. Similar ventral abdominal wall hernias containing nonobstructed transverse colon and fat. No associated fluid or inflammatory changes. 5. Chronic endometrial thickening. Recommend correlation with postmenopausal bleeding. Aortic Atherosclerosis (ICD10-I70.0) and Emphysema (ICD10-J43.9). Electronically Signed   By: Harrietta Sherry M.D.   On: 11/05/2023 16:35   DG Chest 2 View Result Date: 11/05/2023 CLINICAL DATA:  Shortness of breath.  Chest pain. EXAM: CHEST - 2 VIEW COMPARISON:  08/27/2023. FINDINGS: Redemonstration of small-to-moderate left pleural effusion with associated underlying compressive atelectatic changes. There are heterogeneous opacities overlying  the left mid lung zone, which appears slightly less pronounced than the prior exam. Bilateral lung fields are otherwise clear. Redemonstration of presumed fiducial marker/postbiopsy clip overlying the right mid lung zone. Right lateral costophrenic angle is clear. Stable cardio-mediastinal silhouette. No acute osseous abnormalities. The soft tissues are within normal limits. Right-sided CT Port-A-Cath is again seen with its tip overlying the cavoatrial junction region. IMPRESSION: *Redemonstration of small-to-moderate left pleural effusion. *Heterogeneous opacities overlying the left mid lung zone, slightly less pronounced than the prior exam. Electronically Signed   By: Ree Molt M.D.   On: 11/05/2023 14:59     ASSESSMENT/PLAN:  This is  a very pleasant 83 year old Caucasian female with likely stage IV (T2a, Laurie Mccoy, Laurie Mccoy, Laurie Mccoy positive KRAS G12C mutation. Discussed this can be used in the second line setting in the future.    The patient underwent SRS and craniotomy under the care of Dr. Izell and Dr. Carollee on 07/10/22. Dr. Carollee does not recommend any chemotherapy for at least 1 month from her surgery due to wound healing.    She is recently completed radiation to the chest under the care of Dr. Shannon. The last day of radiation was scheduled for 08/23/22  The plan is to undergo systemic chemotherapy wit carboplatin  for an AUC of 5, Alimta  500 mg/m, Keytruda  200 mg IV every 3 weeks. She is status post 16 cycles.  Starting from cycle #5, she started maintenance Alimta  and Keytruda . Her dose of Alimta  is reduced to 400 mg/m2.    Labs were reviewed. Recommend that she proceed with cycle #17 today as scheduled.   The patient  has frequently COPD exacerbations. She has had several Er workups which fortunately have been negative for PE or cardiac etiology to take. She does wear supplemental oxygen at home. She uses nebulizer. She knows should she have symptoms related to COPD, to reach out to her pulmonologist.  It may be worth a conversation of pulmonary rehab would be something that would benefit her or not doing breathing exercises such as with a incentive spirometer.   She will continue taking tramadol  for her back pain.  I also talked about topical Salonpas patches or heating pads.  She will continue on eliquis .    She will continue to follow with pulmonary medicine and cardiology.  She inquired about fit king recovery compression boots. 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 It may be best to avoid this product.  I offered to give her oxycodone  while in the infusion room today due to her back pain.  While back there her back pain was a 0.  Therefore she would like to hold off on taking oxycodone  today.    The patient was advised to call immediately if she has any concerning symptoms in the interval. The patient voices understanding of current disease status and treatment options and is in agreement with the current care plan. All questions were answered. The patient knows to call the clinic with any problems, questions or concerns. We can certainly see the patient much sooner if necessary  No orders of the defined types were placed in this encounter.    The total time spent in the appointment was 20-29 minutes  Brenisha Tsui L Arianna Delsanto, PA-C 11/12/23

## 2023-11-11 ENCOUNTER — Encounter: Payer: Self-pay | Admitting: Internal Medicine

## 2023-11-11 ENCOUNTER — Other Ambulatory Visit: Payer: Self-pay | Admitting: Physician Assistant

## 2023-11-12 ENCOUNTER — Inpatient Hospital Stay: Payer: Medicare Other

## 2023-11-12 ENCOUNTER — Inpatient Hospital Stay (HOSPITAL_BASED_OUTPATIENT_CLINIC_OR_DEPARTMENT_OTHER): Payer: Medicare Other | Admitting: Physician Assistant

## 2023-11-12 ENCOUNTER — Inpatient Hospital Stay: Payer: Medicare Other | Attending: Internal Medicine

## 2023-11-12 VITALS — BP 147/57 | HR 75 | Temp 97.8°F | Resp 18 | Wt 197.6 lb

## 2023-11-12 DIAGNOSIS — Z923 Personal history of irradiation: Secondary | ICD-10-CM | POA: Diagnosis not present

## 2023-11-12 DIAGNOSIS — Z5112 Encounter for antineoplastic immunotherapy: Secondary | ICD-10-CM | POA: Diagnosis not present

## 2023-11-12 DIAGNOSIS — Z79899 Other long term (current) drug therapy: Secondary | ICD-10-CM | POA: Diagnosis not present

## 2023-11-12 DIAGNOSIS — Z87442 Personal history of urinary calculi: Secondary | ICD-10-CM | POA: Insufficient documentation

## 2023-11-12 DIAGNOSIS — J432 Centrilobular emphysema: Secondary | ICD-10-CM | POA: Insufficient documentation

## 2023-11-12 DIAGNOSIS — C3412 Malignant neoplasm of upper lobe, left bronchus or lung: Secondary | ICD-10-CM | POA: Insufficient documentation

## 2023-11-12 DIAGNOSIS — Z7901 Long term (current) use of anticoagulants: Secondary | ICD-10-CM | POA: Diagnosis not present

## 2023-11-12 DIAGNOSIS — M549 Dorsalgia, unspecified: Secondary | ICD-10-CM | POA: Diagnosis not present

## 2023-11-12 DIAGNOSIS — G893 Neoplasm related pain (acute) (chronic): Secondary | ICD-10-CM | POA: Diagnosis not present

## 2023-11-12 DIAGNOSIS — I10 Essential (primary) hypertension: Secondary | ICD-10-CM | POA: Diagnosis not present

## 2023-11-12 DIAGNOSIS — C3490 Malignant neoplasm of unspecified part of unspecified bronchus or lung: Secondary | ICD-10-CM

## 2023-11-12 DIAGNOSIS — Z9089 Acquired absence of other organs: Secondary | ICD-10-CM | POA: Insufficient documentation

## 2023-11-12 DIAGNOSIS — C7931 Secondary malignant neoplasm of brain: Secondary | ICD-10-CM | POA: Insufficient documentation

## 2023-11-12 DIAGNOSIS — I700 Atherosclerosis of aorta: Secondary | ICD-10-CM | POA: Insufficient documentation

## 2023-11-12 DIAGNOSIS — R9389 Abnormal findings on diagnostic imaging of other specified body structures: Secondary | ICD-10-CM | POA: Diagnosis not present

## 2023-11-12 DIAGNOSIS — J4489 Other specified chronic obstructive pulmonary disease: Secondary | ICD-10-CM | POA: Diagnosis not present

## 2023-11-12 DIAGNOSIS — Z888 Allergy status to other drugs, medicaments and biological substances status: Secondary | ICD-10-CM | POA: Diagnosis not present

## 2023-11-12 DIAGNOSIS — Z5111 Encounter for antineoplastic chemotherapy: Secondary | ICD-10-CM | POA: Diagnosis present

## 2023-11-12 DIAGNOSIS — I82501 Chronic embolism and thrombosis of unspecified deep veins of right lower extremity: Secondary | ICD-10-CM | POA: Diagnosis not present

## 2023-11-12 DIAGNOSIS — J90 Pleural effusion, not elsewhere classified: Secondary | ICD-10-CM | POA: Diagnosis not present

## 2023-11-12 DIAGNOSIS — Z9049 Acquired absence of other specified parts of digestive tract: Secondary | ICD-10-CM | POA: Diagnosis not present

## 2023-11-12 DIAGNOSIS — K439 Ventral hernia without obstruction or gangrene: Secondary | ICD-10-CM | POA: Diagnosis not present

## 2023-11-12 DIAGNOSIS — Z9981 Dependence on supplemental oxygen: Secondary | ICD-10-CM | POA: Insufficient documentation

## 2023-11-12 DIAGNOSIS — F419 Anxiety disorder, unspecified: Secondary | ICD-10-CM | POA: Diagnosis not present

## 2023-11-12 DIAGNOSIS — Z95828 Presence of other vascular implants and grafts: Secondary | ICD-10-CM

## 2023-11-12 LAB — CBC WITH DIFFERENTIAL (CANCER CENTER ONLY)
Abs Immature Granulocytes: 0 10*3/uL (ref 0.00–0.07)
Basophils Absolute: 0.1 10*3/uL (ref 0.0–0.1)
Basophils Relative: 2 %
Eosinophils Absolute: 0.1 10*3/uL (ref 0.0–0.5)
Eosinophils Relative: 2 %
HCT: 36.9 % (ref 36.0–46.0)
Hemoglobin: 11.8 g/dL — ABNORMAL LOW (ref 12.0–15.0)
Immature Granulocytes: 0 %
Lymphocytes Relative: 21 %
Lymphs Abs: 0.9 10*3/uL (ref 0.7–4.0)
MCH: 31.6 pg (ref 26.0–34.0)
MCHC: 32 g/dL (ref 30.0–36.0)
MCV: 98.9 fL (ref 80.0–100.0)
Monocytes Absolute: 0.4 10*3/uL (ref 0.1–1.0)
Monocytes Relative: 10 %
Neutro Abs: 2.7 10*3/uL (ref 1.7–7.7)
Neutrophils Relative %: 65 %
Platelet Count: 234 10*3/uL (ref 150–400)
RBC: 3.73 MIL/uL — ABNORMAL LOW (ref 3.87–5.11)
RDW: 14.3 % (ref 11.5–15.5)
WBC Count: 4.1 10*3/uL (ref 4.0–10.5)
nRBC: 0 % (ref 0.0–0.2)

## 2023-11-12 LAB — CMP (CANCER CENTER ONLY)
ALT: 11 U/L (ref 0–44)
AST: 13 U/L — ABNORMAL LOW (ref 15–41)
Albumin: 3.9 g/dL (ref 3.5–5.0)
Alkaline Phosphatase: 74 U/L (ref 38–126)
Anion gap: 5 (ref 5–15)
BUN: 15 mg/dL (ref 8–23)
CO2: 30 mmol/L (ref 22–32)
Calcium: 8.9 mg/dL (ref 8.9–10.3)
Chloride: 102 mmol/L (ref 98–111)
Creatinine: 0.68 mg/dL (ref 0.44–1.00)
GFR, Estimated: >60 mL/min (ref >60)
Glucose, Bld: 121 mg/dL — ABNORMAL HIGH (ref 70–99)
Potassium: 4.1 mmol/L (ref 3.5–5.1)
Sodium: 137 mmol/L (ref 135–145)
Total Bilirubin: 0.4 mg/dL (ref 0.0–1.2)
Total Protein: 6.5 g/dL (ref 6.5–8.1)

## 2023-11-12 MED ORDER — SODIUM CHLORIDE 0.9% FLUSH
10.0000 mL | INTRAVENOUS | Status: DC | PRN
  Administered 2023-11-12: 10 mL

## 2023-11-12 MED ORDER — SODIUM CHLORIDE 0.9 % IV SOLN
Freq: Once | INTRAVENOUS | Status: AC
Start: 2023-11-12 — End: 2023-11-12

## 2023-11-12 MED ORDER — OXYCODONE HCL 5 MG PO TABS
5.0000 mg | ORAL_TABLET | Freq: Once | ORAL | Status: DC
  Filled 2023-11-12: qty 1

## 2023-11-12 MED ORDER — SODIUM CHLORIDE 0.9 % IV SOLN
200.0000 mg | Freq: Once | INTRAVENOUS | Status: AC
  Administered 2023-11-12: 200 mg via INTRAVENOUS
  Filled 2023-11-12: qty 200

## 2023-11-12 MED ORDER — HEPARIN SOD (PORK) LOCK FLUSH 100 UNIT/ML IV SOLN
500.0000 [IU] | Freq: Once | INTRAVENOUS | Status: AC | PRN
  Administered 2023-11-12: 500 [IU]

## 2023-11-12 MED ORDER — ONDANSETRON HCL 4 MG/2ML IJ SOLN
8.0000 mg | Freq: Once | INTRAMUSCULAR | Status: AC
  Administered 2023-11-12: 8 mg via INTRAVENOUS
  Filled 2023-11-12: qty 4

## 2023-11-12 MED ORDER — SODIUM CHLORIDE 0.9 % IV SOLN
400.0000 mg/m2 | Freq: Once | INTRAVENOUS | Status: AC
  Administered 2023-11-12: 800 mg via INTRAVENOUS
  Filled 2023-11-12: qty 20

## 2023-11-12 MED ORDER — SODIUM CHLORIDE 0.9% FLUSH
10.0000 mL | Freq: Once | INTRAVENOUS | Status: AC
  Administered 2023-11-12: 10 mL

## 2023-11-12 NOTE — Patient Instructions (Signed)
 [facility] - A DEPT OF [facility]  Discharge Instructions: Thank you for choosing [facility] to provide your oncology and hematology care.   If you have a lab appointment with the Cancer Center, please go directly to the Cancer Center and check in at the registration area.   Wear comfortable clothing and clothing appropriate for easy access to any Portacath or PICC line.   We strive to give you quality time with your provider. You may need to reschedule your appointment if you arrive late (15 or more minutes).  Arriving late affects you and other patients whose appointments are after yours.  Also, if you miss three or more appointments without notifying the office, you may be dismissed from the clinic at the provider's discretion.      For prescription refill requests, have your pharmacy contact our office and allow 72 hours for refills to be completed.    Today you received the following chemotherapy and/or immunotherapy agents: Keytruda, Alimta.       To help prevent nausea and vomiting after your treatment, we encourage you to take your nausea medication as directed.  BELOW ARE SYMPTOMS THAT SHOULD BE REPORTED IMMEDIATELY: *FEVER GREATER THAN 100.4 F (38 C) OR HIGHER *CHILLS OR SWEATING *NAUSEA AND VOMITING THAT IS NOT CONTROLLED WITH YOUR NAUSEA MEDICATION *UNUSUAL SHORTNESS OF BREATH *UNUSUAL BRUISING OR BLEEDING *URINARY PROBLEMS (pain or burning when urinating, or frequent urination) *BOWEL PROBLEMS (unusual diarrhea, constipation, pain near the anus) TENDERNESS IN MOUTH AND THROAT WITH OR WITHOUT PRESENCE OF ULCERS (sore throat, sores in mouth, or a toothache) UNUSUAL RASH, SWELLING OR PAIN  UNUSUAL VAGINAL DISCHARGE OR ITCHING   Items with * indicate a potential emergency and should be followed up as soon as possible or go to the Emergency Department if any problems should occur.  Please show the CHEMOTHERAPY ALERT CARD or  IMMUNOTHERAPY ALERT CARD at check-in to the Emergency Department and triage nurse.  Should you have questions after your visit or need to cancel or reschedule your appointment, please contact [facility] - A DEPT OF [facility]  Dept: [phone]  and follow the prompts.  Office hours are 8:00 a.m. to 4:30 p.m. Monday - Friday. Please note that voicemails left after 4:00 p.m. may not be returned until the following business day.  We are closed weekends and major holidays. You have access to a nurse at all times for urgent questions. Please call the main number to the clinic Dept: [phone] and follow the prompts.   For any non-urgent questions, you may also contact your provider using MyChart. We now offer e-Visits for anyone 61 and older to request care online for non-urgent symptoms. For details visit [email or web address].   Also download the MyChart app! Go to the app store, search "MyChart", open the app, select , and log in with your MyChart username and password.

## 2023-11-14 ENCOUNTER — Ambulatory Visit: Payer: Self-pay | Admitting: Radiation Oncology

## 2023-11-17 ENCOUNTER — Ambulatory Visit: Payer: Medicare Other | Admitting: Internal Medicine

## 2023-11-17 ENCOUNTER — Other Ambulatory Visit: Payer: Medicare Other

## 2023-11-17 ENCOUNTER — Ambulatory Visit: Payer: Medicare Other

## 2023-11-19 ENCOUNTER — Encounter: Payer: Self-pay | Admitting: Radiation Therapy

## 2023-11-19 NOTE — Progress Notes (Signed)
Pt cancelled brain MRI follow-up scan due to illness and does not want to reschedule until she is feeling better.   Jalene Mullet R.T.(R)(T) Radiation Special Procedures Lead

## 2023-11-22 ENCOUNTER — Other Ambulatory Visit: Payer: Self-pay

## 2023-11-26 ENCOUNTER — Other Ambulatory Visit (HOSPITAL_COMMUNITY): Payer: Self-pay

## 2023-11-26 ENCOUNTER — Ambulatory Visit: Payer: Medicare Other | Admitting: Physician Assistant

## 2023-11-26 ENCOUNTER — Other Ambulatory Visit: Payer: Medicare Other

## 2023-11-26 ENCOUNTER — Ambulatory Visit: Payer: Medicare Other

## 2023-11-29 NOTE — Progress Notes (Signed)
 Updated pemetrexed ERX to [id number]   Pryor Ochoa, PharmD 11/29/23

## 2023-12-03 ENCOUNTER — Ambulatory Visit: Payer: Medicare Other

## 2023-12-03 ENCOUNTER — Ambulatory Visit: Payer: Medicare Other | Admitting: Physician Assistant

## 2023-12-03 ENCOUNTER — Other Ambulatory Visit: Payer: Medicare Other

## 2023-12-04 ENCOUNTER — Other Ambulatory Visit: Payer: Self-pay | Admitting: Emergency Medicine

## 2023-12-08 ENCOUNTER — Other Ambulatory Visit: Payer: Self-pay | Admitting: Physician Assistant

## 2023-12-08 DIAGNOSIS — C3490 Malignant neoplasm of unspecified part of unspecified bronchus or lung: Secondary | ICD-10-CM

## 2023-12-10 ENCOUNTER — Inpatient Hospital Stay: Payer: Medicare Other

## 2023-12-10 ENCOUNTER — Inpatient Hospital Stay: Payer: Medicare Other | Attending: Internal Medicine

## 2023-12-10 ENCOUNTER — Telehealth: Payer: Self-pay | Admitting: Medical Oncology

## 2023-12-10 ENCOUNTER — Inpatient Hospital Stay: Payer: Medicare Other | Admitting: Internal Medicine

## 2023-12-10 ENCOUNTER — Telehealth: Payer: Self-pay | Admitting: Internal Medicine

## 2023-12-10 ENCOUNTER — Telehealth: Payer: Self-pay

## 2023-12-10 NOTE — Telephone Encounter (Signed)
 Called pt to let her know she was over late for her appt, She stated she had called yesterday to rescedule them because she was not feeling well.

## 2023-12-10 NOTE — Telephone Encounter (Signed)
 I called pt . She said she is very tired, hoarse voice and has a very loud wet sounding cough . She attributes the symptoms to Tramadol because she read the drug information sheet. She woke  up and her bed was wet. She said she was incontinent of urine and she is not sure if she also had a fever and then sweated.  I instructed her to contact PCP for further recommendations or Pulmonary.   I also instructed pt to go to ED -She said " No ,no , no I am not going to the ED . Last time I waited there for 7 hours".

## 2023-12-11 ENCOUNTER — Telehealth: Payer: Self-pay | Admitting: *Deleted

## 2023-12-11 NOTE — Telephone Encounter (Signed)
 Contacted patient to check in on her. She reports they called their [facility] and her husband picked up a covid and flu test. She will do them when she feels strong enough. She states she is very weak, requires assistance to get up and go to bathroom. Denies fever, but states pajamas and sheets damp overnight "like I'd broken a fever". She has audible wheeze with cough. Encouraged to drink plenty of fluids and and not just lie in bed or recliner -  ambulate with assistance as much as tolerated. Advised her to go to ED if symptoms worsened. She states she does not want to do that

## 2023-12-15 ENCOUNTER — Telehealth: Payer: Self-pay | Admitting: Medical Oncology

## 2023-12-15 ENCOUNTER — Ambulatory Visit: Payer: Medicare Other

## 2023-12-15 ENCOUNTER — Other Ambulatory Visit: Payer: Medicare Other

## 2023-12-15 ENCOUNTER — Encounter: Payer: Self-pay | Admitting: Internal Medicine

## 2023-12-15 ENCOUNTER — Ambulatory Visit: Payer: Medicare Other | Admitting: Physician Assistant

## 2023-12-15 ENCOUNTER — Other Ambulatory Visit: Payer: Self-pay | Admitting: Medical Oncology

## 2023-12-15 NOTE — Telephone Encounter (Signed)
 F/U- cough from last week. " I am better still just weak".  I told her to call if symptoms worsen or fever 100.5 or  higher.

## 2023-12-24 ENCOUNTER — Ambulatory Visit: Payer: Medicare Other

## 2023-12-24 ENCOUNTER — Other Ambulatory Visit: Payer: Medicare Other

## 2023-12-24 ENCOUNTER — Ambulatory Visit: Payer: Medicare Other | Admitting: Internal Medicine

## 2023-12-28 NOTE — Progress Notes (Unsigned)
 [facility] OFFICE PROGRESS NOTE  [name], NP [phone] [address] [place] [address]  DIAGNOSIS: Stage IV (T2 a, N3, M1b) non-small cell lung cancer, adenocarcinoma presented with left upper lobe perihilar mass in addition to left hilar and mediastinal lymphadenopathy as well as left supraclavicular lymphadenopathy with brain metastasis diagnosed in September 2023.   [name]   Detected Alteration(s) / Biomarker(s)          Associated FDA-approved therapies  Clinical Trial Availability          % cfDNA or Amplification   KRAS G12C approved by FDA Adagrasib, Sotorasib Yes    1.7%   TP53 R249S None Yes          1.5%  PRIOR THERAPY:  1) SRS the the metastatic brain lesion under the care of Dr. Basilio Cairo 2) Craniotomy on 07/10/22 under the care of Dr. Jake Samples 3) radiation to the chest under care of Dr. Roselind Messier.  Last dose on 08/23/2022  CURRENT THERAPY:  Systemic chemotherapy with carboplatin for an AUC of 5, to 500 mg/m2, and Keytruda 200 mg IV every 3 weeks.  First dose expected on October 08, 2022. Starting from cycle #5 she will be on maintenance treatment with Alimta and Keytruda every 3 weeks. Status post 17 cycles. Will reduce her dose of alimta to 400 mg/m2 secondary to intolerance. Currently on hold until we see her with her PET scan on 01/21/24  INTERVAL HISTORY: [place] 83 y.o. female returns to the clinic today for a follow-up visit accompanied by her husband. She was supposed to come for treatment earlier this month but had not been feeling well with increased weakness and cough.  The patient feels like she is declined over the last few weeks.  Her husband feels like she has had a significant decline.  He is not able to perform as many activities before she needs to stop and rest. She had a low-grade fever about 5 to 6 days ago.  She called her pulmonologist who prescribed antibiotics.  She is finishing up the antibiotics at this time.  She had an appointment with  her pulmonologist 2 days ago on 12/29/2023 the prescribed 40 mg of prednisone.  She is only taken this for 1 day and is too early to notice any appreciable adverse side effects but she does feel like this will help.  They are going to call his office back tomorrow for status update.  The patient feels like she may have had a infection.  She checked herself for COVID and flu which was negative.   Denies any chills or night.  She denies any sick contacts.  When she coughs, usually she does not produce phlegm.  When she does produce phlegm it is clear or yellow.  She denies any hemoptysis.  She is supposed wear supplemental oxygen but was not wearing any supplemental oxygen today.  She denies any nausea, vomiting, diarrhea, or constipation.  She states sometimes first in the morning she feels dizzy.  She admits that she is not always compliant with her CPAP machine at nighttime.  She denies any rashes or skin changes.  She is here today for evaluation repeat blood work before undergoing cycle #18.   MEDICAL HISTORY: Past Medical History:  Diagnosis Date   Anxiety    "had panic attacks years ago"   Asthma    COPD (chronic obstructive pulmonary disease) (HCC)    Goiter 2022   History of hiatal hernia    "  dx in college, never bothered me"   History of kidney stones 2012   History of radiation therapy    Left Lung-07/10/22-08/23/22- Dr. Antony Blackbird   Hypertension    Lung cancer [facility])    Sleep apnea     ALLERGIES:  is allergic to compazine [prochlorperazine], norvasc [amlodipine], and trelegy ellipta [fluticasone-umeclidin-vilant].  MEDICATIONS:  Current Outpatient Medications  Medication Sig Dispense Refill   albuterol (VENTOLIN HFA) 108 (90 Base) MCG/ACT inhaler Inhale 2 puffs into the lungs every 6 (six) hours as needed for wheezing or shortness of breath. 18 g 0   ALPRAZolam (XANAX) 0.25 MG tablet Take 0.25 mg by mouth at bedtime as needed for anxiety.     Budeson-Glycopyrrol-Formoterol  (BREZTRI AEROSPHERE) 160-9-4.8 MCG/ACT AERO INHALE 2 PUFFS BY MOUTH EVERY MORNING AND EVERY NIGHT AT BEDTIME (Patient taking differently: Inhale 2 puffs into the lungs in the morning and at bedtime.) 10.7 g 5   ELIQUIS 5 MG TABS tablet Take 1 tablet (5 mg total) by mouth 2 (two) times daily. 60 tablet 3   folic acid (FOLVITE) 1 MG tablet TAKE ONE TABLET BY MOUTH EVERY DAY 30 tablet 2   furosemide (LASIX) 20 MG tablet 1 tablet p.o. daily as needed for increased leg swelling. 20 tablet 0   gabapentin (NEURONTIN) 100 MG capsule Take 1 capsule (100 mg total) by mouth 2 (two) times daily. (Patient not taking: Reported on 08/27/2023) 90 capsule 2   hydrOXYzine (ATARAX) 10 MG tablet Take 1 tablet (10 mg total) by mouth 3 (three) times daily as needed. 60 tablet 2   ipratropium-albuterol (DUONEB) 0.5-2.5 (3) MG/3ML SOLN Take 3 mLs by nebulization every 6 (six) hours as needed. 360 mL 0   levocetirizine (XYZAL) 5 MG tablet Take 5 mg by mouth at bedtime as needed (for seasonal allergies- if not taking generic Claritin).     lidocaine-prilocaine (EMLA) cream Apply 1 Application topically as needed. 30 g 2   LORazepam (ATIVAN) 1 MG tablet Take 1 tablet (1 mg total) by mouth as needed. 30 min Prior to MRI or brain radiation procedure 5 tablet 0   methocarbamol (ROBAXIN) 500 MG tablet Take 1 tablet (500 mg total) by mouth every 8 (eight) hours as needed for muscle spasms. 20 tablet 0   ondansetron (ZOFRAN) 8 MG tablet Take 1 tablet (8 mg total) by mouth every 8 (eight) hours as needed for nausea or vomiting. 30 tablet 2   oxyCODONE (OXY IR/ROXICODONE) 5 MG immediate release tablet Take 1 tablet (5 mg total) by mouth every 4 (four) hours as needed for severe pain. 12 tablet 0   pantoprazole (PROTONIX) 20 MG tablet Take 20 mg by mouth every evening.     predniSONE (DELTASONE) 10 MG tablet Take 2 tablets (20 mg total) by mouth daily. (Patient taking differently: Take 20 mg by mouth as needed.) 20 tablet 0    telmisartan (MICARDIS) 40 MG tablet Take 1 tablet (40 mg total) by mouth daily. 90 tablet 3   traMADol (ULTRAM) 50 MG tablet Take 2 tablets (100 mg total) by mouth every 6 (six) hours as needed for severe pain. (Patient taking differently: Take 50 mg by mouth every 6 (six) hours as needed for severe pain (pain score 7-10).) 30 tablet 0   Zinc Acetate, Oral, (ZINC ACETATE PO) Take 1 tablet by mouth daily. Received from herbalist     No current facility-administered medications for this visit.    SURGICAL HISTORY:  Past Surgical History:  Procedure Laterality Date  APPENDECTOMY     APPLICATION OF CRANIAL NAVIGATION N/A 07/11/2022   Procedure: APPLICATION OF CRANIAL NAVIGATION;  Surgeon: Dawley, Alan Mulder, DO;  Location: [facility] OR;  Service: Neurosurgery;  Laterality: N/A;   BRONCHIAL BIOPSY  06/12/2021   Procedure: BRONCHIAL BIOPSIES;  Surgeon: Josephine Igo, DO;  Location: [facility] ENDOSCOPY;  Service: Pulmonary;;   BRONCHIAL BRUSHINGS  06/12/2021   Procedure: BRONCHIAL BRUSHINGS;  Surgeon: Josephine Igo, DO;  Location: [facility] ENDOSCOPY;  Service: Pulmonary;;   BRONCHIAL WASHINGS  06/12/2021   Procedure: BRONCHIAL WASHINGS;  Surgeon: Josephine Igo, DO;  Location: [facility] ENDOSCOPY;  Service: Pulmonary;;   CHOLECYSTECTOMY     COLON RESECTION     CRANIOTOMY N/A 07/11/2022   Procedure: Craniotomy for resection of tumor;  Surgeon: Bethann Goo, DO;  Location: [facility] OR;  Service: Neurosurgery;  Laterality: N/A;  RM 19   FIDUCIAL MARKER PLACEMENT  06/12/2021   Procedure: FIDUCIAL MARKER PLACEMENT;  Surgeon: Josephine Igo, DO;  Location: [facility] END[facility];  Service: Pulmonary;;   hernia     x 2   IR IMAGING GUIDED PORT INSERTION  10/11/2022   TONSILLECTOMY     VIDEO BRONCHOSCOPY WITH ENDOBRONCHIAL NAVIGATION Bilateral 06/12/2021   Procedure: VIDEO BRONCHOSCOPY WITH ENDOBRONCHIAL NAVIGATION;  Surgeon: Josephine Igo, DO;  Location: [facility];  Service: Pulmonary;  Laterality: Bilateral;  ION   VIDEO  BRONCHOSCOPY WITH RADIAL ENDOBRONCHIAL ULTRASOUND  06/12/2021   Procedure: RADIAL ENDOBRONCHIAL ULTRASOUND;  Surgeon: Josephine Igo, DO;  Location: [facility];  Service: Pulmonary;;    REVIEW OF SYSTEMS:   Review of Systems  Constitutional: Positive for fatigue and generalized weakness.  Negative for appetite change, chills, fever and unexpected weight change.  HENT: Negative for mouth sores, nosebleeds, sore throat and trouble swallowing.   Eyes: Negative for eye problems and icterus.  Respiratory: Positive for cough and shortness of breath with exertion.  Negative for hemoptysis and wheezing.   Cardiovascular: Negative for chest pain and leg swelling.  Gastrointestinal: Negative for abdominal pain, constipation, diarrhea, nausea and vomiting.  Genitourinary: Negative for bladder incontinence, difficulty urinating, dysuria, frequency and hematuria.   Musculoskeletal: Negative for back pain, gait problem, neck pain and neck stiffness.  Skin: Negative for itching and rash.  Neurological: Negative for dizziness, extremity weakness, gait problem, headaches, light-headedness and seizures.  Hematological: Negative for adenopathy. Does not bruise/bleed easily.  Psychiatric/Behavioral: Negative for confusion, depression and sleep disturbance. The patient is not nervous/anxious.     PHYSICAL EXAMINATION:  There were no vitals taken for this visit.  ECOG PERFORMANCE STATUS: 2  Physical Exam  Constitutional: Oriented to person, place, and time and well-developed, well-nourished, and in no distress.  HENT:  Head: Normocephalic and atraumatic.  Mouth/Throat: Oropharynx is clear and moist. No oropharyngeal exudate.  Eyes: Conjunctivae are normal. Right eye exhibits no discharge. Left eye exhibits no discharge. No scleral icterus.  Neck: Normal range of motion. Neck supple.  Cardiovascular: Normal rate, regular rhythm, normal heart sounds and intact distal pulses.   Pulmonary/Chest: Effort  normal and breath sounds normal. No respiratory distress. No wheezes. No rales.  Abdominal: Soft. Bowel sounds are normal. Exhibits no distension and no mass. There is no tenderness.  Musculoskeletal: Normal range of motion. Exhibits no edema.  Lymphadenopathy:    No cervical adenopathy.  Neurological: Alert and oriented to person, place, and time. Neurological: Alert and oriented to person, place, and time. Exhibits also wasting.  Examined in the chair.  Skin: Skin is warm and dry.  No rash noted. Not diaphoretic. No erythema. No pallor.  Psychiatric: Mood, memory and judgment normal.  Vitals reviewed.  LABORATORY DATA: Lab Results  Component Value Date   WBC 4.1 11/12/2023   HGB 11.8 (L) 11/12/2023   HCT 36.9 11/12/2023   MCV 98.9 11/12/2023   PLT 234 11/12/2023      Chemistry      Component Value Date/Time   NA 137 11/12/2023 1453   NA 139 09/17/2019 1550   K 4.1 11/12/2023 1453   CL 102 11/12/2023 1453   CO2 30 11/12/2023 1453   BUN 15 11/12/2023 1453   BUN 9 09/17/2019 1550   CREATININE 0.68 11/12/2023 1453      Component Value Date/Time   CALCIUM 8.9 11/12/2023 1453   ALKPHOS 74 11/12/2023 1453   AST 13 (L) 11/12/2023 1453   ALT 11 11/12/2023 1453   BILITOT 0.4 11/12/2023 1453       RADIOGRAPHIC STUDIES:  No results found.   ASSESSMENT/PLAN:  This is a very pleasant 83 year old Caucasian female with likely stage IV (T2a, N3, M1B) non-small cell lung cancer, adenocarcinoma presented with left upper lobe perihilar mass in addition to left hilar and mediastinal lymphadenopathy as well as left supraclavicular lymphadenopathy and a solitary brain metastasis diagnosed in September 2023. The molecular studies showed positive KRAS G12C mutation. Discussed this can be used in the second line setting in the future.    The patient underwent SRS and craniotomy under the care of Dr. Basilio Cairo and Dr. Jake Samples on 07/10/22. Dr. Jake Samples does not recommend any chemotherapy for at  least 1 month from her surgery due to wound healing.    She is recently completed radiation to the chest under the care of Dr. Roselind Messier. The last day of radiation was scheduled for 08/23/22   The plan is to undergo systemic chemotherapy wit carboplatin for an AUC of 5, Alimta 500 mg/m, Keytruda 200 mg IV every 3 weeks. She is status post 16 cycles.  Starting from cycle #5, she started maintenance Alimta and Keytruda. Her dose of Alimta is reduced to 400 mg/m2.   The patient is having progressively worsening fatigue and generalized weakness with more cumulative doses of chemotherapy.  Therefore she was seen with Dr. Arbutus Ped today we discussed her options.  The patient has had a lot of questions regarding viable tumor in her lung after chemo and radiation as she was treated initially for locally advanced disease in the chest initially and the solitary brain lesion was treated.  Therefore she will have a PET scan performed to help further characterize if viable tumor under the post radiation scarring.  If her scan looks stable, Dr. Arbutus Ped may consider her for a treatment break due to concerns with quality of life.  She is in agreement with this plan.  Since she is on 40 mg of prednisone, we will not administer her treatment with immunotherapy or chemotherapy today.  She will continue taking prednisone under the direction of her pulmonologist.  We will see her in 3 weeks to go over her PET scan results.  She will continue on eliquis.    The patient was advised to call immediately if she has any concerning symptoms in the interval. The patient voices understanding of current disease status and treatment options and is in agreement with the current care plan. All questions were answered. The patient knows to call the clinic with any problems, questions or concerns. We can certainly see the patient much sooner  if necessary  No orders of the defined types were placed in this encounter.    Taneeka Curtner L  Laneya Gasaway, PA-C 12/28/23  ADDENDUM: Hematology/Oncology Attending:  I had a face-to-face encounter with the patient today.  I reviewed her records, lab and recommended her care plan.  This is a 83 years old white female with stage IV non-small cell lung cancer, adenocarcinoma diagnosed in September 2023 with positive KRAS G12C mutation.  She is status post SRS to metastatic brain lesion followed by craniotomy followed by radiation to the left lung mass.  The patient then started systemic chemotherapy initially with carboplatin, Alimta and Keytruda for 4 cycles and starting from cycle #5 she has been on maintenance treatment with Alimta and Keytruda every 3 weeks.  She missed few of her treatment recently.  She presented today complaining of increasing fatigue and weakness and decreased quality of life.  She was seen by her pulmonologist recently and started treatment with a course of antibiotics and steroids which make her feel a little bit better. The patient is still have a lot of question about her diagnosis and prognosis and whether her cancer is still alive or dead. I had a lengthy discussion with the patient and her husband today about the nature of stage IV non-small cell lung cancer and the low rate of 5-year survival with this disease. I recommended for her to hold her treatment for now until she feels better.  In the meantime we will order a PET scan for further evaluation of her disease to assess the active disease versus her the responding target lesions. Will see the patient back for follow-up visit in 3 weeks for evaluation and discussion of the PET scan results. If the PET scan showed no significant residual disease, we will continue to monitor her closely with observation. She was advised to call immediately if she has any other concerning symptoms in the interval. The total time spent in the appointment was 30 minutes. Disclaimer: This note was dictated with voice recognition  software. Similar sounding words can inadvertently be transcribed and may be missed upon review. Lajuana Matte, MD

## 2023-12-31 ENCOUNTER — Inpatient Hospital Stay: Payer: Medicare Other | Attending: Internal Medicine

## 2023-12-31 ENCOUNTER — Inpatient Hospital Stay: Payer: Medicare Other

## 2023-12-31 ENCOUNTER — Inpatient Hospital Stay (HOSPITAL_BASED_OUTPATIENT_CLINIC_OR_DEPARTMENT_OTHER): Payer: Medicare Other | Admitting: Physician Assistant

## 2023-12-31 VITALS — BP 124/73 | HR 89 | Temp 98.7°F | Resp 18 | Ht 66.5 in

## 2023-12-31 DIAGNOSIS — C3490 Malignant neoplasm of unspecified part of unspecified bronchus or lung: Secondary | ICD-10-CM

## 2023-12-31 DIAGNOSIS — Z87442 Personal history of urinary calculi: Secondary | ICD-10-CM | POA: Diagnosis not present

## 2023-12-31 DIAGNOSIS — I10 Essential (primary) hypertension: Secondary | ICD-10-CM | POA: Insufficient documentation

## 2023-12-31 DIAGNOSIS — C3412 Malignant neoplasm of upper lobe, left bronchus or lung: Secondary | ICD-10-CM | POA: Insufficient documentation

## 2023-12-31 DIAGNOSIS — R059 Cough, unspecified: Secondary | ICD-10-CM | POA: Insufficient documentation

## 2023-12-31 DIAGNOSIS — Z7189 Other specified counseling: Secondary | ICD-10-CM | POA: Diagnosis not present

## 2023-12-31 DIAGNOSIS — C7931 Secondary malignant neoplasm of brain: Secondary | ICD-10-CM | POA: Insufficient documentation

## 2023-12-31 DIAGNOSIS — Z79899 Other long term (current) drug therapy: Secondary | ICD-10-CM | POA: Diagnosis not present

## 2023-12-31 DIAGNOSIS — Z9049 Acquired absence of other specified parts of digestive tract: Secondary | ICD-10-CM | POA: Insufficient documentation

## 2023-12-31 DIAGNOSIS — R42 Dizziness and giddiness: Secondary | ICD-10-CM | POA: Diagnosis not present

## 2023-12-31 DIAGNOSIS — R509 Fever, unspecified: Secondary | ICD-10-CM | POA: Diagnosis not present

## 2023-12-31 DIAGNOSIS — Z9089 Acquired absence of other organs: Secondary | ICD-10-CM | POA: Diagnosis not present

## 2023-12-31 DIAGNOSIS — J441 Chronic obstructive pulmonary disease with (acute) exacerbation: Secondary | ICD-10-CM | POA: Diagnosis not present

## 2023-12-31 DIAGNOSIS — Z888 Allergy status to other drugs, medicaments and biological substances status: Secondary | ICD-10-CM | POA: Diagnosis not present

## 2023-12-31 DIAGNOSIS — Z923 Personal history of irradiation: Secondary | ICD-10-CM | POA: Diagnosis not present

## 2023-12-31 DIAGNOSIS — Z7901 Long term (current) use of anticoagulants: Secondary | ICD-10-CM | POA: Insufficient documentation

## 2023-12-31 DIAGNOSIS — Z95828 Presence of other vascular implants and grafts: Secondary | ICD-10-CM

## 2023-12-31 DIAGNOSIS — F419 Anxiety disorder, unspecified: Secondary | ICD-10-CM | POA: Diagnosis not present

## 2023-12-31 DIAGNOSIS — C7932 Secondary malignant neoplasm of cerebral meninges: Secondary | ICD-10-CM | POA: Diagnosis not present

## 2023-12-31 LAB — CBC WITH DIFFERENTIAL (CANCER CENTER ONLY)
Abs Immature Granulocytes: 0.01 10*3/uL (ref 0.00–0.07)
Basophils Absolute: 0 10*3/uL (ref 0.0–0.1)
Basophils Relative: 0 %
Eosinophils Absolute: 0 10*3/uL (ref 0.0–0.5)
Eosinophils Relative: 0 %
HCT: 35.7 % — ABNORMAL LOW (ref 36.0–46.0)
Hemoglobin: 11.1 g/dL — ABNORMAL LOW (ref 12.0–15.0)
Immature Granulocytes: 0 %
Lymphocytes Relative: 10 %
Lymphs Abs: 0.5 10*3/uL — ABNORMAL LOW (ref 0.7–4.0)
MCH: 29.2 pg (ref 26.0–34.0)
MCHC: 31.1 g/dL (ref 30.0–36.0)
MCV: 93.9 fL (ref 80.0–100.0)
Monocytes Absolute: 0.5 10*3/uL (ref 0.1–1.0)
Monocytes Relative: 9 %
Neutro Abs: 4.2 10*3/uL (ref 1.7–7.7)
Neutrophils Relative %: 81 %
Platelet Count: 317 10*3/uL (ref 150–400)
RBC: 3.8 MIL/uL — ABNORMAL LOW (ref 3.87–5.11)
RDW: 14.5 % (ref 11.5–15.5)
WBC Count: 5.2 10*3/uL (ref 4.0–10.5)
nRBC: 0 % (ref 0.0–0.2)

## 2023-12-31 LAB — CMP (CANCER CENTER ONLY)
ALT: 12 U/L (ref 0–44)
AST: 12 U/L — ABNORMAL LOW (ref 15–41)
Albumin: 3.4 g/dL — ABNORMAL LOW (ref 3.5–5.0)
Alkaline Phosphatase: 71 U/L (ref 38–126)
Anion gap: 7 (ref 5–15)
BUN: 10 mg/dL (ref 8–23)
CO2: 30 mmol/L (ref 22–32)
Calcium: 9 mg/dL (ref 8.9–10.3)
Chloride: 101 mmol/L (ref 98–111)
Creatinine: 0.4 mg/dL — ABNORMAL LOW (ref 0.44–1.00)
GFR, Estimated: >60 mL/min (ref >60)
Glucose, Bld: 153 mg/dL — ABNORMAL HIGH (ref 70–99)
Potassium: 3.8 mmol/L (ref 3.5–5.1)
Sodium: 138 mmol/L (ref 135–145)
Total Bilirubin: 0.4 mg/dL (ref 0.0–1.2)
Total Protein: 6.6 g/dL (ref 6.5–8.1)

## 2023-12-31 LAB — TSH: TSH: 1.903 u[IU]/mL (ref 0.350–4.500)

## 2023-12-31 MED ORDER — SODIUM CHLORIDE 0.9% FLUSH
10.0000 mL | Freq: Once | INTRAVENOUS | Status: AC
  Administered 2023-12-31: 10 mL

## 2024-01-01 LAB — T4: T4, Total: 8.2 ug/dL (ref 4.5–12.0)

## 2024-01-08 ENCOUNTER — Ambulatory Visit (HOSPITAL_COMMUNITY)
Admission: RE | Admit: 2024-01-08 | Discharge: 2024-01-08 | Disposition: A | Discharge status: Home / Self Care | Source: Ambulatory Visit | Attending: Physician Assistant | Admitting: Physician Assistant

## 2024-01-08 DIAGNOSIS — C3490 Malignant neoplasm of unspecified part of unspecified bronchus or lung: Secondary | ICD-10-CM | POA: Diagnosis present

## 2024-01-08 DIAGNOSIS — C7932 Secondary malignant neoplasm of cerebral meninges: Secondary | ICD-10-CM | POA: Diagnosis present

## 2024-01-08 MED ORDER — FLUDEOXYGLUCOSE F - 18 (FDG) INJECTION
10.6500 | Freq: Once | INTRAVENOUS | Status: AC | PRN
  Administered 2024-01-08: 10.65 via INTRAVENOUS

## 2024-01-21 ENCOUNTER — Telehealth: Payer: Self-pay | Admitting: Medical Oncology

## 2024-01-21 ENCOUNTER — Inpatient Hospital Stay: Admitting: Internal Medicine

## 2024-01-21 ENCOUNTER — Inpatient Hospital Stay

## 2024-01-21 ENCOUNTER — Inpatient Hospital Stay: Attending: Internal Medicine

## 2024-01-21 DIAGNOSIS — C778 Secondary and unspecified malignant neoplasm of lymph nodes of multiple regions: Secondary | ICD-10-CM | POA: Insufficient documentation

## 2024-01-21 DIAGNOSIS — I2510 Atherosclerotic heart disease of native coronary artery without angina pectoris: Secondary | ICD-10-CM | POA: Insufficient documentation

## 2024-01-21 DIAGNOSIS — M2550 Pain in unspecified joint: Secondary | ICD-10-CM | POA: Insufficient documentation

## 2024-01-21 DIAGNOSIS — J449 Chronic obstructive pulmonary disease, unspecified: Secondary | ICD-10-CM | POA: Insufficient documentation

## 2024-01-21 DIAGNOSIS — R0609 Other forms of dyspnea: Secondary | ICD-10-CM | POA: Insufficient documentation

## 2024-01-21 DIAGNOSIS — M6281 Muscle weakness (generalized): Secondary | ICD-10-CM | POA: Insufficient documentation

## 2024-01-21 DIAGNOSIS — I10 Essential (primary) hypertension: Secondary | ICD-10-CM | POA: Insufficient documentation

## 2024-01-21 DIAGNOSIS — G479 Sleep disorder, unspecified: Secondary | ICD-10-CM | POA: Insufficient documentation

## 2024-01-21 DIAGNOSIS — C3412 Malignant neoplasm of upper lobe, left bronchus or lung: Secondary | ICD-10-CM | POA: Insufficient documentation

## 2024-01-21 DIAGNOSIS — C7931 Secondary malignant neoplasm of brain: Secondary | ICD-10-CM | POA: Insufficient documentation

## 2024-01-21 DIAGNOSIS — D649 Anemia, unspecified: Secondary | ICD-10-CM | POA: Insufficient documentation

## 2024-01-21 DIAGNOSIS — R5383 Other fatigue: Secondary | ICD-10-CM | POA: Insufficient documentation

## 2024-01-21 DIAGNOSIS — R059 Cough, unspecified: Secondary | ICD-10-CM | POA: Insufficient documentation

## 2024-01-21 NOTE — Telephone Encounter (Signed)
 LVM that we will r/s her appts.

## 2024-01-22 ENCOUNTER — Encounter: Payer: Self-pay | Admitting: Medical Oncology

## 2024-01-23 ENCOUNTER — Other Ambulatory Visit: Payer: Self-pay

## 2024-01-23 ENCOUNTER — Telehealth: Payer: Self-pay | Admitting: Internal Medicine

## 2024-01-23 ENCOUNTER — Encounter: Payer: Self-pay | Admitting: Internal Medicine

## 2024-01-23 NOTE — Telephone Encounter (Signed)
 Scheduled appointments and the patient is aware .

## 2024-01-24 ENCOUNTER — Other Ambulatory Visit: Payer: Self-pay

## 2024-01-27 ENCOUNTER — Inpatient Hospital Stay

## 2024-01-27 ENCOUNTER — Telehealth: Payer: Self-pay | Admitting: Internal Medicine

## 2024-01-27 ENCOUNTER — Inpatient Hospital Stay (HOSPITAL_BASED_OUTPATIENT_CLINIC_OR_DEPARTMENT_OTHER): Admitting: Internal Medicine

## 2024-01-27 VITALS — BP 116/59 | HR 110 | Temp 97.2°F | Resp 15 | Ht 66.5 in

## 2024-01-27 DIAGNOSIS — C7931 Secondary malignant neoplasm of brain: Secondary | ICD-10-CM | POA: Diagnosis not present

## 2024-01-27 DIAGNOSIS — M6281 Muscle weakness (generalized): Secondary | ICD-10-CM | POA: Diagnosis not present

## 2024-01-27 DIAGNOSIS — C3412 Malignant neoplasm of upper lobe, left bronchus or lung: Secondary | ICD-10-CM | POA: Diagnosis present

## 2024-01-27 DIAGNOSIS — I10 Essential (primary) hypertension: Secondary | ICD-10-CM | POA: Diagnosis not present

## 2024-01-27 DIAGNOSIS — C3490 Malignant neoplasm of unspecified part of unspecified bronchus or lung: Secondary | ICD-10-CM | POA: Diagnosis not present

## 2024-01-27 DIAGNOSIS — M2550 Pain in unspecified joint: Secondary | ICD-10-CM | POA: Diagnosis not present

## 2024-01-27 DIAGNOSIS — G479 Sleep disorder, unspecified: Secondary | ICD-10-CM | POA: Diagnosis not present

## 2024-01-27 DIAGNOSIS — R059 Cough, unspecified: Secondary | ICD-10-CM | POA: Diagnosis not present

## 2024-01-27 DIAGNOSIS — R0609 Other forms of dyspnea: Secondary | ICD-10-CM | POA: Diagnosis not present

## 2024-01-27 DIAGNOSIS — D649 Anemia, unspecified: Secondary | ICD-10-CM | POA: Diagnosis not present

## 2024-01-27 DIAGNOSIS — Z95828 Presence of other vascular implants and grafts: Secondary | ICD-10-CM

## 2024-01-27 DIAGNOSIS — I2510 Atherosclerotic heart disease of native coronary artery without angina pectoris: Secondary | ICD-10-CM | POA: Diagnosis not present

## 2024-01-27 DIAGNOSIS — C778 Secondary and unspecified malignant neoplasm of lymph nodes of multiple regions: Secondary | ICD-10-CM | POA: Diagnosis not present

## 2024-01-27 DIAGNOSIS — J449 Chronic obstructive pulmonary disease, unspecified: Secondary | ICD-10-CM | POA: Diagnosis not present

## 2024-01-27 DIAGNOSIS — R5383 Other fatigue: Secondary | ICD-10-CM | POA: Diagnosis not present

## 2024-01-27 LAB — CBC WITH DIFFERENTIAL (CANCER CENTER ONLY)
Abs Immature Granulocytes: 0.05 10*3/uL (ref 0.00–0.07)
Basophils Absolute: 0.1 10*3/uL (ref 0.0–0.1)
Basophils Relative: 1 %
Eosinophils Absolute: 0.1 10*3/uL (ref 0.0–0.5)
Eosinophils Relative: 2 %
HCT: 36.4 % (ref 36.0–46.0)
Hemoglobin: 11.4 g/dL — ABNORMAL LOW (ref 12.0–15.0)
Immature Granulocytes: 1 %
Lymphocytes Relative: 12 %
Lymphs Abs: 0.9 10*3/uL (ref 0.7–4.0)
MCH: 28.3 pg (ref 26.0–34.0)
MCHC: 31.3 g/dL (ref 30.0–36.0)
MCV: 90.3 fL (ref 80.0–100.0)
Monocytes Absolute: 0.5 10*3/uL (ref 0.1–1.0)
Monocytes Relative: 6 %
Neutro Abs: 6.1 10*3/uL (ref 1.7–7.7)
Neutrophils Relative %: 78 %
Platelet Count: 343 10*3/uL (ref 150–400)
RBC: 4.03 MIL/uL (ref 3.87–5.11)
RDW: 15.1 % (ref 11.5–15.5)
WBC Count: 7.7 10*3/uL (ref 4.0–10.5)
nRBC: 0 % (ref 0.0–0.2)

## 2024-01-27 LAB — CMP (CANCER CENTER ONLY)
ALT: 38 U/L (ref 0–44)
AST: 16 U/L (ref 15–41)
Albumin: 3.2 g/dL — ABNORMAL LOW (ref 3.5–5.0)
Alkaline Phosphatase: 87 U/L (ref 38–126)
Anion gap: 7 (ref 5–15)
BUN: 17 mg/dL (ref 8–23)
CO2: 30 mmol/L (ref 22–32)
Calcium: 8.6 mg/dL — ABNORMAL LOW (ref 8.9–10.3)
Chloride: 100 mmol/L (ref 98–111)
Creatinine: 0.43 mg/dL — ABNORMAL LOW (ref 0.44–1.00)
GFR, Estimated: >60 mL/min (ref >60)
Glucose, Bld: 131 mg/dL — ABNORMAL HIGH (ref 70–99)
Potassium: 3.9 mmol/L (ref 3.5–5.1)
Sodium: 137 mmol/L (ref 135–145)
Total Bilirubin: 0.5 mg/dL (ref 0.0–1.2)
Total Protein: 6.3 g/dL — ABNORMAL LOW (ref 6.5–8.1)

## 2024-01-27 LAB — TSH: TSH: 7.21 u[IU]/mL — ABNORMAL HIGH (ref 0.350–4.500)

## 2024-01-27 MED ORDER — HEPARIN SOD (PORK) LOCK FLUSH 100 UNIT/ML IV SOLN
500.0000 [IU] | Freq: Once | INTRAVENOUS | Status: AC
  Administered 2024-01-27: 500 [IU]

## 2024-01-27 MED ORDER — SODIUM CHLORIDE 0.9% FLUSH
10.0000 mL | Freq: Once | INTRAVENOUS | Status: AC
  Administered 2024-01-27: 10 mL

## 2024-01-27 NOTE — Progress Notes (Signed)
 [facility] [phone]   [phone]  OFFICE PROGRESS NOTE  Sallye Crease, NP [phone] [address] [place] [address]  DIAGNOSIS: Stage IV (T2 a, N3, M1b) non-small cell lung cancer, adenocarcinoma presented with left upper lobe perihilar mass in addition to left hilar and mediastinal lymphadenopathy as well as left supraclavicular lymphadenopathy with brain metastasis diagnosed in September 2023.   Aaron Aas   Detected Alteration(s) / Biomarker(s)          Associated FDA-approved therapies  Clinical Trial Availability          % cfDNA or Amplification   KRAS G12C approved by FDA Adagrasib, Sotorasib Yes    1.7%   TP53 R249S None Yes          1.5%   PRIOR THERAPY:  1) SRS the the metastatic brain lesion under the care of Dr. Lurena Sally 2) Craniotomy on 07/10/22 under the care of Dr. Julane Ny 3) radiation to the chest under care of Dr. Eloise Hake.  Last dose on 08/23/2022 4) Systemic chemotherapy with carboplatin  for an AUC of 5,  Alimta  500 mg/m2, and Keytruda  200 mg IV every 3 weeks.  First dose expected on October 08, 2022. Starting from cycle #5 she will be on maintenance treatment with Alimta  and Keytruda  every 3 weeks. Status post 17 cycles. Will reduce her dose of alimta  to 400 mg/m2 secondary to intolerance. Last dose of treatment was given on November 03, 2023 discontinued secondary to intolerance.  CURRENT THERAPY: Observation.  INTERVAL HISTORY: Laurie  Mccoy 83 y.o. female returns to the clinic today for follow-up visit accompanied by her husband.Discussed the use of AI scribe software for clinical note transcription with the patient, who gave verbal consent to proceed.  History of Present Illness   Laurie  Mccoy is an 83 year old female with stage IV adenocarcinoma who presents for evaluation and repeat PET scan.  She was diagnosed with stage IV adenocarcinoma in September 2023, with a positive KRAS G12C mutation. Her treatment history  includes SRS to brain metastasis, craniotomy with resection of a brain tumor, and radiation to the left side of the chest. She initially received systemic chemotherapy with carboplatin , Alimta , and Keytruda  for four cycles, followed by maintenance therapy with Alimta  and Keytruda  for seventeen cycles. She has been off treatment for several weeks due to intolerance.  She experiences difficulty sleeping through the night, often waking around 2 AM and feeling uncomfortable in the bedroom. She frequently moves to a recliner to sleep, where she spends most of her day. This sleep disruption has been impacting her daily life.  She has a lot of phlegm and is concerned about potential pneumonia, although this is not confirmed. She also reports mild anemia, but her hemoglobin levels are not significantly low.  She mentions pain under her right rib, which she attributes to possibly pulled muscles or her sleeping position. There is no indication of any significant findings on the right side.  Her recent PET scan shows diffuse interstitial thickening in the left upper lobe at the treatment site, a small focus of metabolic activity in the left lower lobe, and a small left pleural effusion. There is no evidence of metastatic adenopathy in the mediastinum or distant metastatic disease.  She has been off maintenance treatment for two months and reports feeling better without it. She is currently managing her COPD with the help of her pulmonologist.       MEDICAL HISTORY: Past Medical  History:  Diagnosis Date   Anxiety    "had panic attacks years ago"   Asthma    COPD (chronic obstructive pulmonary disease) (HCC)    Goiter 2022   History of hiatal hernia    "dx in college, never bothered me"   History of kidney stones 2012   History of radiation therapy    Left Lung-07/10/22-08/23/22- Dr. Retta Caster   Hypertension    Lung cancer [facility])    Sleep apnea     ALLERGIES:  is allergic to compazine   [prochlorperazine ], norvasc  [amlodipine ], and trelegy ellipta  [fluticasone -umeclidin-vilant].  MEDICATIONS:  Current Outpatient Medications  Medication Sig Dispense Refill   albuterol  (VENTOLIN  HFA) 108 (90 Base) MCG/ACT inhaler Inhale 2 puffs into the lungs every 6 (six) hours as needed for wheezing or shortness of breath. 18 g 0   ALPRAZolam  (XANAX ) 0.25 MG tablet Take 0.25 mg by mouth at bedtime as needed for anxiety.     Budeson-Glycopyrrol-Formoterol  (BREZTRI  AEROSPHERE) 160-9-4.8 MCG/ACT AERO INHALE 2 PUFFS BY MOUTH EVERY MORNING AND EVERY NIGHT AT BEDTIME (Patient taking differently: Inhale 2 puffs into the lungs in the morning and at bedtime.) 10.7 g 5   ELIQUIS  5 MG TABS tablet Take 1 tablet (5 mg total) by mouth 2 (two) times daily. 60 tablet 3   folic acid  (FOLVITE ) 1 MG tablet TAKE ONE TABLET BY MOUTH EVERY DAY 30 tablet 2   furosemide  (LASIX ) 20 MG tablet 1 tablet p.o. daily as needed for increased leg swelling. 20 tablet 0   gabapentin  (NEURONTIN ) 100 MG capsule Take 1 capsule (100 mg total) by mouth 2 (two) times daily. (Patient not taking: Reported on 08/27/2023) 90 capsule 2   hydrOXYzine  (ATARAX ) 10 MG tablet Take 1 tablet (10 mg total) by mouth 3 (three) times daily as needed. 60 tablet 2   ipratropium-albuterol  (DUONEB) 0.5-2.5 (3) MG/3ML SOLN Take 3 mLs by nebulization every 6 (six) hours as needed. 360 mL 0   levocetirizine (XYZAL) 5 MG tablet Take 5 mg by mouth at bedtime as needed (for seasonal allergies- if not taking generic Claritin ).     lidocaine -prilocaine  (EMLA ) cream Apply 1 Application topically as needed. 30 g 2   LORazepam  (ATIVAN ) 1 MG tablet Take 1 tablet (1 mg total) by mouth as needed. 30 min Prior to MRI or brain radiation procedure 5 tablet 0   methocarbamol  (ROBAXIN ) 500 MG tablet Take 1 tablet (500 mg total) by mouth every 8 (eight) hours as needed for muscle spasms. 20 tablet 0   ondansetron  (ZOFRAN ) 8 MG tablet Take 1 tablet (8 mg total) by mouth every 8  (eight) hours as needed for nausea or vomiting. 30 tablet 2   oxyCODONE  (OXY IR/ROXICODONE ) 5 MG immediate release tablet Take 1 tablet (5 mg total) by mouth every 4 (four) hours as needed for severe pain. 12 tablet 0   pantoprazole  (PROTONIX ) 20 MG tablet Take 20 mg by mouth every evening.     predniSONE  (DELTASONE ) 10 MG tablet Take 2 tablets (20 mg total) by mouth daily. (Patient taking differently: Take 20 mg by mouth as needed.) 20 tablet 0   telmisartan  (MICARDIS ) 40 MG tablet Take 1 tablet (40 mg total) by mouth daily. 90 tablet 3   traMADol  (ULTRAM ) 50 MG tablet Take 2 tablets (100 mg total) by mouth every 6 (six) hours as needed for severe pain. (Patient taking differently: Take 50 mg by mouth every 6 (six) hours as needed for severe pain (pain score 7-10).) 30 tablet 0  Zinc  Acetate, Oral, (ZINC  ACETATE PO) Take 1 tablet by mouth daily. Received from herbalist     No current facility-administered medications for this visit.    SURGICAL HISTORY:  Past Surgical History:  Procedure Laterality Date   APPENDECTOMY     APPLICATION OF CRANIAL NAVIGATION N/A 07/11/2022   Procedure: APPLICATION OF CRANIAL NAVIGATION;  Surgeon: Dawley, Colby Daub, DO;  Location: [facility] OR;  Service: Neurosurgery;  Laterality: N/A;   BRONCHIAL BIOPSY  06/12/2021   Procedure: BRONCHIAL BIOPSIES;  Surgeon: Prudy Brownie, DO;  Location: [facility] ENDOSCOPY;  Service: Pulmonary;;   BRONCHIAL BRUSHINGS  06/12/2021   Procedure: BRONCHIAL BRUSHINGS;  Surgeon: Prudy Brownie, DO;  Location: [facility] ENDOS[facility];  Service: Pulmonary;;   BRONCHIAL WASHINGS  06/12/2021   Procedure: BRONCHIAL WASHINGS;  Surgeon: Prudy Brownie, DO;  Location: [facility] END[facility];  Service: Pulmonary;;   CHOLECYSTECTOMY     COLON RESECTION     CRANIOTOMY N/A 07/11/2022   Procedure: Craniotomy for resection of tumor;  Surgeon: Pincus Bridgeman, DO;  Location: [facility] OR;  Service: Neurosurgery;  Laterality: N/A;  RM 19   FIDUCIAL MARKER PLACEMENT  06/12/2021    Procedure: FIDUCIAL MARKER PLACEMENT;  Surgeon: Prudy Brownie, DO;  Location: [facility];  Service: Pulmonary;;   hernia     x 2   IR IMAGING GUIDED PORT INSERTION  10/11/2022   TONSILLECTOMY     VIDEO BRONCHOSCOPY WITH ENDOBRONCHIAL NAVIGATION Bilateral 06/12/2021   Procedure: VIDEO BRONCHOSCOPY WITH ENDOBRONCHIAL NAVIGATION;  Surgeon: Prudy Brownie, DO;  Location: [facility];  Service: Pulmonary;  Laterality: Bilateral;  ION   VIDEO BRONCHOSCOPY WITH RADIAL ENDOBRONCHIAL ULTRASOUND  06/12/2021   Procedure: RADIAL ENDOBRONCHIAL ULTRASOUND;  Surgeon: Prudy Brownie, DO;  Location: [facility];  Service: Pulmonary;;    REVIEW OF SYSTEMS:  Constitutional: positive for fatigue Eyes: negative Ears, nose, mouth, throat, and face: negative Respiratory: positive for cough, dyspnea on exertion, and sputum Cardiovascular: negative Gastrointestinal: negative Genitourinary:negative Integument/breast: negative Hematologic/lymphatic: negative Musculoskeletal:positive for arthralgias and muscle weakness Neurological: negative Behavioral/Psych: positive for sleep disturbance Endocrine: negative Allergic/Immunologic: negative   PHYSICAL EXAMINATION: General appearance: alert, cooperative, fatigued, and no distress Head: Normocephalic, without obvious abnormality, atraumatic Neck: no adenopathy, no JVD, supple, symmetrical, trachea midline, and thyroid  not enlarged, symmetric, no tenderness/mass/nodules Lymph nodes: Cervical, supraclavicular, and axillary nodes normal. Resp: rhonchi bilaterally Back: symmetric, no curvature. ROM normal. No CVA tenderness. Cardio: regular rate and rhythm, S1, S2 normal, no murmur, click, rub or gallop GI: soft, non-tender; bowel sounds normal; no masses,  no organomegaly Extremities: extremities normal, atraumatic, no cyanosis or edema Neurologic: Alert and oriented X 3, normal strength and tone. Normal symmetric reflexes. Normal coordination and  gait  ECOG PERFORMANCE STATUS: 1 - Symptomatic but completely ambulatory  Blood pressure (!) 116/59, pulse (!) 110, temperature (!) 97.2 F (36.2 C), resp. rate 15, height 5' 6.5" (1.689 m), SpO2 91%.  LABORATORY DATA: Lab Results  Component Value Date   WBC 7.7 01/27/2024   HGB 11.4 (L) 01/27/2024   HCT 36.4 01/27/2024   MCV 90.3 01/27/2024   PLT 343 01/27/2024      Chemistry      Component Value Date/Time   NA 137 01/27/2024 0931   NA 139 09/17/2019 1550   K 3.9 01/27/2024 0931   CL 100 01/27/2024 0931   CO2 30 01/27/2024 0931   BUN 17 01/27/2024 0931   BUN 9 09/17/2019 1550   CREATININE 0.43 (L) 01/27/2024 1610  Component Value Date/Time   CALCIUM  8.6 (L) 01/27/2024 0931   ALKPHOS 87 01/27/2024 0931   AST 16 01/27/2024 0931   ALT 38 01/27/2024 0931   BILITOT 0.5 01/27/2024 0931       RADIOGRAPHIC STUDIES: NM PET Image Restage (PS) Skull Base to Thigh (F-18 FDG) Result Date: 01/20/2024 CLINICAL DATA:  Subsequent treatment strategy for non-small cell lung cancer. EXAM: NUCLEAR MEDICINE PET SKULL BASE TO THIGH TECHNIQUE: 10.7 mCi F-18 FDG was injected intravenously. Full-ring PET imaging was performed from the skull base to thigh after the radiotracer. CT data was obtained and used for attenuation correction and anatomic localization. Fasting blood glucose: 116 mg/dl COMPARISON:  CT 84/69/6295, 08/27/2023 choose one FINDINGS: NECK: No hypermetabolic lymph nodes in the neck. Incidental CT findings: None. CHEST: There is interstitial thickening in the LEFT upper lobe is treatment site. The interstitial thickening is slightly progressed from comparison CT scans. No focality within the interstitial thickening. Moderate radiotracer activity associated with the interstitial thickening with SUV max equal 5.5 on image 67 for example. There is no focality associated with the diffuse interstitial thickening which is moderately hypermetabolic. No evidence of chest wall invasion. No  hypermetabolic mediastinal nodes. Within the LEFT lower lobe there is a focus metabolic activity posterior to the hilum with SUV max equal 4.6 on image 73. There is a small nodule in medial lung adjacent the descending aorta measuring 5 mm on image 73/CT series 202. Incidental CT findings: Small effusion. ABDOMEN/PELVIS: No abnormal hypermetabolic activity within the liver, pancreas, adrenal glands, or spleen. No hypermetabolic lymph nodes in the abdomen or pelvis. Activity through the distal anus is favored physiologic Incidental CT findings: None. SKELETON: No focal hypermetabolic activity to suggest skeletal metastasis. Incidental CT findings: None. IMPRESSION: 1. Diffuse interstitial thickening in the LEFT upper lobe at treatment site. There is moderate radiotracer activity associated with the interstitial thickening. No focality within the interstitial thickening. Findings are favored post radiation change. Recommend attention on follow-up. 2. Small focus of metabolic activity in the LEFT lower lobe posterior to the hilum is indeterminate. Recommend attention on follow-up. Potential follow-up PET-CT scan as lesions difficult define on CT. 3. Small LEFT pleural effusion. 4. No evidence of metastatic adenopathy in the mediastinum. 5. No evidence distant metastatic disease. Electronically Signed   By: Deboraha Fallow M.D.   On: 01/20/2024 16:34    ASSESSMENT AND PLAN: This is a 83 years old white female with Stage IV (T2 a, N3, M1b) non-small cell lung cancer, adenocarcinoma presented with left upper lobe perihilar mass in addition to left hilar and mediastinal lymphadenopathy as well as left supraclavicular lymphadenopathy with brain metastasis diagnosed in September 2023.   . The patient is status post SRS to brain metastasis followed by craniotomy followed by palliative radiotherapy to the left lung mass.  She was then started on systemic chemotherapy initially with carboplatin , Alimta  and Keytruda  for 4  cycles followed by maintenance treatment with Alimta  and Keytruda  starting from cycle #5 for a total of 17 cycles.  She has been off treatment since late January 2025 secondary to intolerance. She had repeat PET scan performed recently that showed no concerning findings for disease progression and significant improvement of her disease compared to the initial studies.    Stage IV (T2 a, N3, M1b) non-small cell lung cancer, adenocarcinoma presented with left upper lobe perihilar mass in addition to left hilar and mediastinal lymphadenopathy as well as left supraclavicular lymphadenopathy with brain metastasis diagnosed in September 2023.  Adenocarcinoma of the lung with KRAS mutation, initially diagnosed in September 2023. She has undergone systemic chemotherapy and radiation therapy. Recent PET scan shows no evidence of metastatic adenopathy in the mediastinum or distant metastatic disease. There is diffuse interstitial thickening in the left upper lobe at the treatment site, consistent with post-radiation changes, and a small left pleural effusion. A small focus of metabolic activity in the left lower lobe is indeterminate. The cancer is currently under control, and she has been off treatment for two months due to intolerance. She reports feeling better off treatment. - Continue treatment break for three months - Order CT scan in three months - Consider oral drug therapy if future scans show concerning findings due to KRAS mutation  COPD COPD is likely contributing to her symptoms of fatigue and phlegm production. She is under the care of Dr. Matilde Son, a pulmonologist, for management of COPD. - Continue management with pulmonologist Dr. Matilde Son  Sleep disturbance She experiences difficulty sleeping through the night, often waking up uncomfortable and needing to move to a recliner. This may be related to COPD or other factors such as sleeping position. - Encourage adjustment of sleeping habits and positions    She was advised to call immediately if she has any other concerning symptoms in the interval. The patient voices understanding of current disease status and treatment options and is in agreement with the current care plan.  All questions were answered. The patient knows to call the clinic with any problems, questions or concerns. We can certainly see the patient much sooner if necessary.  The total time spent in the appointment was 40 minutes.  Disclaimer: This note was dictated with voice recognition software. Similar sounding words can inadvertently be transcribed and may not be corrected upon review.

## 2024-01-27 NOTE — Telephone Encounter (Signed)
 Scheduled appointments around a scan expected date. The patient is aware of the appointment dates.

## 2024-01-28 ENCOUNTER — Telehealth: Payer: Self-pay | Admitting: Radiation Therapy

## 2024-01-28 ENCOUNTER — Ambulatory Visit

## 2024-01-28 LAB — T4: T4, Total: 8 ug/dL (ref 4.5–12.0)

## 2024-01-28 NOTE — Telephone Encounter (Signed)
 I called [name] to see if she is feeling well enough to get her brain MRI rescheduled. She is doing some better. She is on a break from systemic treatment and happy for the good results of her recent PET scan. She has asked for more time before getting the brain MRI rescheduled. She also asked for a reminder and if she can schedule the scan on her own, so I am sending her a card in the mail as a reminder and including the phone number for central scheduling at [facility] for her.   Laurie Mccoy R.T.(R)(T) Radiation Special Procedures Lead

## 2024-01-29 ENCOUNTER — Ambulatory Visit: Payer: Medicare Other | Admitting: Cardiovascular Disease

## 2024-02-04 ENCOUNTER — Telehealth: Admitting: Physician Assistant

## 2024-02-04 ENCOUNTER — Encounter: Payer: Self-pay | Admitting: Internal Medicine

## 2024-02-04 NOTE — Progress Notes (Signed)
 The patient no-showed for appointment despite this provider sending direct link, reaching out via phone with no response and waiting for at least 10 minutes from appointment time for patient to join. They will be marked as a NS for this appointment/time.  ? ?Piedad Climes, PA-C ? ? ? ?

## 2024-02-06 ENCOUNTER — Other Ambulatory Visit: Payer: Self-pay | Admitting: Physician Assistant

## 2024-02-06 DIAGNOSIS — I8290 Acute embolism and thrombosis of unspecified vein: Secondary | ICD-10-CM

## 2024-02-18 ENCOUNTER — Ambulatory Visit

## 2024-02-18 ENCOUNTER — Ambulatory Visit: Admitting: Physician Assistant

## 2024-02-18 ENCOUNTER — Other Ambulatory Visit

## 2024-02-19 ENCOUNTER — Other Ambulatory Visit

## 2024-02-19 ENCOUNTER — Ambulatory Visit: Admitting: Internal Medicine

## 2024-02-19 ENCOUNTER — Ambulatory Visit

## 2024-03-01 NOTE — Progress Notes (Unsigned)
 Cardiology Office Note:  .   Date:  03/03/2024  ID:  Laurie Mccoy, DOB 09-01-1941, MRN [id number] PCP: Laurie Crease, NP  [facility] Providers Cardiologist:  Laurie Bigness, MD { History of Present Illness: .    Chief Complaint  Patient presents with   Follow-up    Laurie  Mccoy is a 83 y.o. female with history of CAD, HTN, lung CA, DVT, venous insufficiency who presents for follow-up.    History of Present Illness   Laurie  Mccoy is an 83 year old female with non-obstructive CAD, hypertension, and lung cancer who presents for follow-up. She is accompanied by her husband, Laurie Mccoy.  She experiences significant weakness and low energy levels, with difficulty walking unassisted and spending much of her time sitting due to leg weakness. She has shortness of breath but no chest pain. She has a history of COPD and is on prednisone  40 mg daily to manage symptoms.  She has stage IV lung cancer and is currently on immunotherapy with Keytruda . She was given a break from treatment for the last couple of months and is scheduled for another PET scan in three months. Her last PET scan reportedly looked good, and she has not experienced any pain related to her cancer.  She has a history of non-obstructive coronary artery disease and is not on aspirin  due to her use of Eliquis  for a previous right popliteal deep vein thrombosis diagnosed in March 2024. She continues to take Eliquis  as prescribed and reports no recent swelling in her legs.  Her blood pressure is managed with telmisartan  40 mg daily. She reports some confusion about her blood pressure medication regimen, as she was advised by different providers to take varying doses. She has not taken Lasix  for several months. She monitors her blood pressure at home and reports it is usually around 115/74.  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 She also mentions a smaller nodule on  her left thyroid  that led to the discovery of the larger one on the right. Her thyroid  function tests have not required any adjustments.  No fever, chills, or chest pain are present. She has a history of low red blood cell count, but her recent hemoglobin levels were normal. She has regular lab work done every three weeks in conjunction with her infusions.          Problem List 1. Non-obstructive CAD  -CAC 662 (89th percentile)  -25-49% LAD/LCX/RCA  -T chol 173, TG 99, HDL 80, LDL 75 -CAC score 782 (89th percentile) 2. Tobacco abuse 3. Hypertension  4. OSA 5. Stage IV Lung CA -brain mets  6. Venous insufficiency  7. R popliteal DVT 12/11/2022    ROS: All other ROS reviewed and negative. Pertinent positives noted in the HPI.     Studies Reviewed: Aaron Aas   EKG Interpretation Date/Time:  Wednesday Mar 03 2024 14:03:03 EDT Ventricular Rate:  114 PR Interval:  130 QRS Duration:  90 QT Interval:  314 QTC Calculation: 432 R Axis:   -49  Text Interpretation: Sinus tachycardia Left axis deviation Pulmonary disease pattern Confirmed by Jackquelyn Mass [phone]) on 03/03/2024 2:31:16 PM   TTE 09/23/2023  1. Left ventricular ejection fraction, by estimation, is 55 to 60%. The  left ventricle has normal function. The left ventricle has no regional  wall motion abnormalities. Left ventricular diastolic parameters are  consistent with Grade I diastolic  dysfunction (impaired relaxation). The average left ventricular  global  longitudinal strain is -17.3 %. The global longitudinal strain is normal.   2. Right ventricular systolic function is normal. The right ventricular  size is normal. There is normal pulmonary artery systolic pressure. The  estimated right ventricular systolic pressure is 34.8 mmHg.   3. The mitral valve is normal in structure. Trivial mitral valve  regurgitation. No evidence of mitral stenosis.   4. The aortic valve is normal in structure. Aortic valve regurgitation is  not  visualized. No aortic stenosis is present.   5. The inferior vena cava is normal in size with greater than 50%  respiratory variability, suggesting right atrial pressure of 3 mmHg.  Physical Exam:   VS:  BP 115/74 (BP Location: Right Arm, Patient Position: Sitting, Cuff Size: Normal)   Pulse (!) 114   Ht 5' 5.5" (1.664 m)   SpO2 90%   BMI 32.38 kg/m    Wt Readings from Last 3 Encounters:  11/12/23 197 lb 9.6 oz (89.6 kg)  11/05/23 194 lb (88 kg)  10/15/23 195 lb (88.5 kg)    GEN: Well nourished, well developed in no acute distress NECK: No JVD; No carotid bruits CARDIAC: RRR, no murmurs, rubs, gallops RESPIRATORY:  Clear to auscultation without rales, wheezing or rhonchi  ABDOMEN: Soft, non-tender, non-distended EXTREMITIES:  No edema; No deformity  ASSESSMENT AND PLAN: .   Assessment and Plan    Sinus tachycardia Sinus tachycardia with heart rate of 114, likely related to COPD and overall health status. - no arrhythmia.   Stage 4 lung cancer Stage 4 lung cancer well-managed on immunotherapy and chemotherapy, currently on a treatment break. - Continue current immunotherapy and chemotherapy regimen. - Follow up with oncology as scheduled.  Hypertension Hypertension controlled with telmisartan . Blood pressure 115/74. Emphasized importance of maintaining BP above 100/70 to prevent weakness. - Continue telmisartan  40 mg daily. - Monitor blood pressure regularly, ensuring it remains above 100/70. - Take Lasix  as needed, not daily.  Right popliteal DVT Right popliteal DVT on Eliquis  with no recent leg swelling. - Continue Eliquis  as prescribed.  Nonobstructive coronary artery disease (CAD) Nonobstructive CAD with well-managed cholesterol levels. Statin therapy held due to active malignancy. - Continue to hold statin therapy given current LDL levels and active malignancy.              Follow-up: Return in about 1 year (around 03/03/2025).   Signed, Gigi Kyle. Rolm Clos,  MD, [facility]  [facility]  [address], [address] [place], [place] [address] [phone]  4:14 PM

## 2024-03-03 ENCOUNTER — Ambulatory Visit: Payer: Medicare Other | Attending: Cardiovascular Disease | Admitting: Cardiovascular Disease

## 2024-03-03 ENCOUNTER — Encounter: Payer: Self-pay | Admitting: Cardiovascular Disease

## 2024-03-03 VITALS — BP 115/74 | HR 114 | Ht 65.5 in

## 2024-03-03 DIAGNOSIS — I2510 Atherosclerotic heart disease of native coronary artery without angina pectoris: Secondary | ICD-10-CM | POA: Insufficient documentation

## 2024-03-03 DIAGNOSIS — I10 Essential (primary) hypertension: Secondary | ICD-10-CM | POA: Insufficient documentation

## 2024-03-03 DIAGNOSIS — I872 Venous insufficiency (chronic) (peripheral): Secondary | ICD-10-CM | POA: Insufficient documentation

## 2024-03-03 DIAGNOSIS — E785 Hyperlipidemia, unspecified: Secondary | ICD-10-CM | POA: Insufficient documentation

## 2024-03-03 NOTE — Patient Instructions (Signed)
 Medication Instructions:  NO  CHANGES  *If you need a refill on your cardiac medications before your next appointment, please call your pharmacy*   Lab Work: NOT NEEDED If you have labs (blood work) drawn today and your tests are completely normal, you will receive your results only by: MyChart Message (if you have MyChart) OR A paper copy in the mail If you have any lab test that is abnormal or we need to change your treatment, we will call you to review the results.   Testing/Procedures:  NOT NEEDED  Follow-Up: At [facility], you and your health needs are our priority.  As part of our continuing mission to provide you with exceptional heart care, we have created designated Provider Care Teams.  These Care Teams include your primary Cardiologist (physician) and Advanced Practice Providers (APPs -  Physician Assistants and Nurse Practitioners) who all work together to provide you with the care you need, when you need it.     Your next appointment:   12 month(s)  The format for your next appointment:   In Person  Provider:   Lawana Pray, NP, Palmer Bobo, NP, or Marlana Silvan, NP

## 2024-03-10 ENCOUNTER — Ambulatory Visit

## 2024-03-10 ENCOUNTER — Telehealth: Payer: Self-pay

## 2024-03-10 ENCOUNTER — Other Ambulatory Visit

## 2024-03-10 ENCOUNTER — Ambulatory Visit: Admitting: Internal Medicine

## 2024-03-10 NOTE — Telephone Encounter (Signed)
 Called patient to discuss scheduling her MRI f/u. She is still not feeling well and has a bad cough. Pt states she will get back with us  when she feels better and I let her know we will call her in one month to follow up.

## 2024-03-23 ENCOUNTER — Encounter: Payer: Self-pay | Admitting: Internal Medicine

## 2024-03-29 NOTE — Telephone Encounter (Signed)
 I have put in an order for a chest xray.

## 2024-03-30 NOTE — Telephone Encounter (Signed)
 [id number] to advise the pt of the order.

## 2024-03-31 NOTE — Telephone Encounter (Signed)
 Pt has chest xray completed on 03/31/24.

## 2024-04-01 NOTE — Telephone Encounter (Signed)
 Chest xray reviewed.  Chronic changes of Lt upper lobe radiation changes with volume loss and left pleural effusion.  She has tried OCT cough syrup.  Still having a cough that is mostly dry.  Takes pain medication every 4 to 6 hours.  Has back and rib pain from coughing.    Will send in script for benzonatate.

## 2024-04-16 ENCOUNTER — Ambulatory Visit (HOSPITAL_COMMUNITY)
Admission: RE | Admit: 2024-04-16 | Discharge: 2024-04-16 | Disposition: A | Discharge status: Home / Self Care | Source: Ambulatory Visit | Attending: Internal Medicine | Admitting: Internal Medicine

## 2024-04-16 ENCOUNTER — Inpatient Hospital Stay: Attending: Internal Medicine

## 2024-04-16 DIAGNOSIS — Z9221 Personal history of antineoplastic chemotherapy: Secondary | ICD-10-CM | POA: Diagnosis not present

## 2024-04-16 DIAGNOSIS — C3412 Malignant neoplasm of upper lobe, left bronchus or lung: Secondary | ICD-10-CM | POA: Insufficient documentation

## 2024-04-16 DIAGNOSIS — M2550 Pain in unspecified joint: Secondary | ICD-10-CM | POA: Insufficient documentation

## 2024-04-16 DIAGNOSIS — Z79899 Other long term (current) drug therapy: Secondary | ICD-10-CM | POA: Insufficient documentation

## 2024-04-16 DIAGNOSIS — C3490 Malignant neoplasm of unspecified part of unspecified bronchus or lung: Secondary | ICD-10-CM | POA: Insufficient documentation

## 2024-04-16 DIAGNOSIS — Z87442 Personal history of urinary calculi: Secondary | ICD-10-CM | POA: Insufficient documentation

## 2024-04-16 DIAGNOSIS — I10 Essential (primary) hypertension: Secondary | ICD-10-CM | POA: Insufficient documentation

## 2024-04-16 DIAGNOSIS — C7931 Secondary malignant neoplasm of brain: Secondary | ICD-10-CM | POA: Diagnosis not present

## 2024-04-16 DIAGNOSIS — Z923 Personal history of irradiation: Secondary | ICD-10-CM | POA: Insufficient documentation

## 2024-04-16 DIAGNOSIS — M6281 Muscle weakness (generalized): Secondary | ICD-10-CM | POA: Diagnosis not present

## 2024-04-16 DIAGNOSIS — R5383 Other fatigue: Secondary | ICD-10-CM | POA: Insufficient documentation

## 2024-04-16 DIAGNOSIS — Z7952 Long term (current) use of systemic steroids: Secondary | ICD-10-CM | POA: Diagnosis not present

## 2024-04-16 DIAGNOSIS — F419 Anxiety disorder, unspecified: Secondary | ICD-10-CM | POA: Insufficient documentation

## 2024-04-16 DIAGNOSIS — J4489 Other specified chronic obstructive pulmonary disease: Secondary | ICD-10-CM | POA: Diagnosis not present

## 2024-04-16 DIAGNOSIS — Z9049 Acquired absence of other specified parts of digestive tract: Secondary | ICD-10-CM | POA: Diagnosis not present

## 2024-04-16 DIAGNOSIS — R059 Cough, unspecified: Secondary | ICD-10-CM | POA: Insufficient documentation

## 2024-04-16 DIAGNOSIS — Z9089 Acquired absence of other organs: Secondary | ICD-10-CM | POA: Insufficient documentation

## 2024-04-16 DIAGNOSIS — Z7901 Long term (current) use of anticoagulants: Secondary | ICD-10-CM | POA: Diagnosis not present

## 2024-04-16 DIAGNOSIS — R0609 Other forms of dyspnea: Secondary | ICD-10-CM | POA: Diagnosis not present

## 2024-04-16 DIAGNOSIS — Z888 Allergy status to other drugs, medicaments and biological substances status: Secondary | ICD-10-CM | POA: Diagnosis not present

## 2024-04-16 LAB — CBC WITH DIFFERENTIAL (CANCER CENTER ONLY)
Abs Immature Granulocytes: 0.03 K/uL (ref 0.00–0.07)
Basophils Absolute: 0 K/uL (ref 0.0–0.1)
Basophils Relative: 1 %
Eosinophils Absolute: 0.1 K/uL (ref 0.0–0.5)
Eosinophils Relative: 1 %
HCT: 36.2 % (ref 36.0–46.0)
Hemoglobin: 11.6 g/dL — ABNORMAL LOW (ref 12.0–15.0)
Immature Granulocytes: 0 %
Lymphocytes Relative: 7 %
Lymphs Abs: 0.5 K/uL — ABNORMAL LOW (ref 0.7–4.0)
MCH: 28.9 pg (ref 26.0–34.0)
MCHC: 32 g/dL (ref 30.0–36.0)
MCV: 90.3 fL (ref 80.0–100.0)
Monocytes Absolute: 0.2 K/uL (ref 0.1–1.0)
Monocytes Relative: 3 %
Neutro Abs: 6.3 K/uL (ref 1.7–7.7)
Neutrophils Relative %: 88 %
Platelet Count: 234 K/uL (ref 150–400)
RBC: 4.01 MIL/uL (ref 3.87–5.11)
RDW: 15.6 % — ABNORMAL HIGH (ref 11.5–15.5)
WBC Count: 7.1 K/uL (ref 4.0–10.5)
nRBC: 0 % (ref 0.0–0.2)

## 2024-04-16 LAB — CMP (CANCER CENTER ONLY)
ALT: 11 U/L (ref 0–44)
AST: 9 U/L — ABNORMAL LOW (ref 15–41)
Albumin: 3.5 g/dL (ref 3.5–5.0)
Alkaline Phosphatase: 70 U/L (ref 38–126)
Anion gap: 6 (ref 5–15)
BUN: 14 mg/dL (ref 8–23)
CO2: 28 mmol/L (ref 22–32)
Calcium: 8.8 mg/dL — ABNORMAL LOW (ref 8.9–10.3)
Chloride: 102 mmol/L (ref 98–111)
Creatinine: 0.44 mg/dL (ref 0.44–1.00)
GFR, Estimated: >60 mL/min (ref >60)
Glucose, Bld: 144 mg/dL — ABNORMAL HIGH (ref 70–99)
Potassium: 4.2 mmol/L (ref 3.5–5.1)
Sodium: 136 mmol/L (ref 135–145)
Total Bilirubin: 0.6 mg/dL (ref 0.0–1.2)
Total Protein: 6.2 g/dL — ABNORMAL LOW (ref 6.5–8.1)

## 2024-04-16 MED ORDER — IOHEXOL 300 MG/ML  SOLN
100.0000 mL | Freq: Once | INTRAMUSCULAR | Status: AC | PRN
  Administered 2024-04-16: 100 mL via INTRAVENOUS

## 2024-04-21 ENCOUNTER — Ambulatory Visit: Admitting: Internal Medicine

## 2024-04-21 ENCOUNTER — Ambulatory Visit

## 2024-04-21 ENCOUNTER — Other Ambulatory Visit

## 2024-04-22 ENCOUNTER — Ambulatory Visit (HOSPITAL_COMMUNITY)
Admission: RE | Admit: 2024-04-22 | Discharge: 2024-04-22 | Disposition: A | Discharge status: Home / Self Care | Source: Ambulatory Visit | Attending: Neurological Surgery | Admitting: Neurological Surgery

## 2024-04-22 DIAGNOSIS — C7931 Secondary malignant neoplasm of brain: Secondary | ICD-10-CM | POA: Insufficient documentation

## 2024-04-22 MED ORDER — GADOBUTROL 1 MMOL/ML IV SOLN
10.0000 mL | Freq: Once | INTRAVENOUS | Status: AC | PRN
  Administered 2024-04-22: 10 mL via INTRAVENOUS

## 2024-04-26 ENCOUNTER — Inpatient Hospital Stay (HOSPITAL_BASED_OUTPATIENT_CLINIC_OR_DEPARTMENT_OTHER): Admitting: Internal Medicine

## 2024-04-26 ENCOUNTER — Telehealth: Payer: Self-pay | Admitting: Medical Oncology

## 2024-04-26 ENCOUNTER — Other Ambulatory Visit: Payer: Self-pay | Admitting: Medical Oncology

## 2024-04-26 VITALS — BP 134/79 | HR 111 | Temp 97.9°F | Resp 17 | Ht 65.5 in

## 2024-04-26 DIAGNOSIS — G629 Polyneuropathy, unspecified: Secondary | ICD-10-CM

## 2024-04-26 DIAGNOSIS — C3490 Malignant neoplasm of unspecified part of unspecified bronchus or lung: Secondary | ICD-10-CM

## 2024-04-26 DIAGNOSIS — C7932 Secondary malignant neoplasm of cerebral meninges: Secondary | ICD-10-CM

## 2024-04-26 DIAGNOSIS — C3412 Malignant neoplasm of upper lobe, left bronchus or lung: Secondary | ICD-10-CM | POA: Diagnosis not present

## 2024-04-26 NOTE — Telephone Encounter (Signed)
 Faxed referral to [facility] for PT home health.

## 2024-04-26 NOTE — Progress Notes (Signed)
 [facility] [phone]   [phone]  OFFICE PROGRESS NOTE  Laurie [name], NP [phone] [address] 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   [name]   Detected Alteration(s) / Biomarker(s)          Associated FDA-approved therapies  Clinical Trial Availability          % cfDNA or Amplification   KRAS G12C approved by FDA Adagrasib, Sotorasib Yes    1.7%   TP53 R249S None Yes          1.5%   PRIOR THERAPY:  1) SRS the the metastatic brain lesion under the care of Dr. Izell 2) Craniotomy on 07/10/22 under the care of Dr. Carollee 3) radiation to the chest under care of Dr. Shannon.  Last dose on 08/23/2022 4) Systemic chemotherapy with carboplatin  for an AUC of 5,  Alimta  500 mg/m2, and Keytruda  200 mg IV every 3 weeks.  First dose expected on October 08, 2022. Starting from cycle #5 she will be on maintenance treatment with Alimta  and Keytruda  every 3 weeks. Status post 17 cycles. Will reduce her dose of alimta  to 400 mg/m2 secondary to intolerance. Last dose of treatment was given on November 03, 2023 discontinued secondary to intolerance.  CURRENT THERAPY: Observation.  INTERVAL HISTORY: Laurie  Mccoy 83 y.o. female returns to the clinic today for follow-up visit accompanied by her husband.Discussed the use of AI scribe software for clinical note transcription with the patient, who gave verbal consent to proceed.  History of Present Illness Laurie  Mccoy is an 83 year old female with stage four non-small cell lung cancer who presents for evaluation and repeat CT scan of the chest. She is accompanied by her husband, who is her primary caregiver.  She was diagnosed with stage four  non-small cell lung cancer, adenocarcinoma subtype, in September 2023, with a positive KRAS G12C mutation. She underwent stereotactic radiosurgery for brain metastasis and completed seventeen cycles of chemotherapy, which were discontinued in January 2025 due to intolerance. A PET scan at that time showed no concerning findings for disease progression. Currently, she is not on chemotherapy. She reports that her most recent CT scan was good.  She experiences weakness as her primary complaint over the past two years. No pain is noted, but her breathing was 'great' last week and is 'a little rough' today, which she attributes to a lack of exercise and being sedentary. Her son is considering arranging for in-home physical therapy to address her weakness and sedentary lifestyle.  She has a history of COPD, which significantly affects her breathing. There is no regular schedule for her visits to her pulmonologist, but she sees him as needed. Her breathing difficulties are more pronounced today, which she attributes to her sedentary lifestyle.     MEDICAL HISTORY: Past Medical History:  Diagnosis Date   Anxiety    had panic attacks years ago   Asthma    COPD (chronic obstructive pulmonary disease) (HCC)    Goiter 2022   History of hiatal hernia    dx in college, never bothered me   History of kidney stones 2012   History of radiation therapy    Left Lung-07/10/22-08/23/22- Dr. Lynwood Shannon   Hypertension  Lung cancer (HCC)    Sleep apnea     ALLERGIES:  is allergic to compazine  [prochlorperazine ], norvasc  [amlodipine ], and trelegy ellipta  [fluticasone -umeclidin-vilant].  MEDICATIONS:  Current Outpatient Medications  Medication Sig Dispense Refill   albuterol  (VENTOLIN  HFA) 108 (90 Base) MCG/ACT inhaler Inhale 2 puffs into the lungs every 6 (six) hours as needed for wheezing or shortness of breath. 18 g 0   ALPRAZolam  (XANAX ) 0.25 MG tablet Take 0.25 mg by mouth at bedtime as needed  for anxiety.     Budeson-Glycopyrrol-Formoterol  (BREZTRI  AEROSPHERE) 160-9-4.8 MCG/ACT AERO INHALE 2 PUFFS BY MOUTH EVERY MORNING AND EVERY NIGHT AT BEDTIME (Patient taking differently: Inhale 2 puffs into the lungs in the morning and at bedtime.) 10.7 g 5   ELIQUIS  5 MG TABS tablet Take 1 tablet (5 mg total) by mouth 2 (two) times daily. 60 tablet 3   folic acid  (FOLVITE ) 1 MG tablet TAKE ONE TABLET BY MOUTH EVERY DAY 30 tablet 2   furosemide  (LASIX ) 20 MG tablet 1 tablet p.o. daily as needed for increased leg swelling. 20 tablet 0   gabapentin  (NEURONTIN ) 100 MG capsule Take 1 capsule (100 mg total) by mouth 2 (two) times daily. 90 capsule 2   hydrOXYzine  (ATARAX ) 10 MG tablet Take 1 tablet (10 mg total) by mouth 3 (three) times daily as needed. 60 tablet 2   ipratropium-albuterol  (DUONEB) 0.5-2.5 (3) MG/3ML SOLN Take 3 mLs by nebulization every 6 (six) hours as needed. 360 mL 0   levocetirizine (XYZAL) 5 MG tablet Take 5 mg by mouth at bedtime as needed (for seasonal allergies- if not taking generic Claritin ).     lidocaine -prilocaine  (EMLA ) cream Apply 1 Application topically as needed. 30 g 2   LORazepam  (ATIVAN ) 1 MG tablet Take 1 tablet (1 mg total) by mouth as needed. 30 min Prior to MRI or brain radiation procedure 5 tablet 0   methocarbamol  (ROBAXIN ) 500 MG tablet Take 1 tablet (500 mg total) by mouth every 8 (eight) hours as needed for muscle spasms. 20 tablet 0   ondansetron  (ZOFRAN ) 8 MG tablet Take 1 tablet (8 mg total) by mouth every 8 (eight) hours as needed for nausea or vomiting. 30 tablet 2   oxyCODONE  (OXY IR/ROXICODONE ) 5 MG immediate release tablet Take 1 tablet (5 mg total) by mouth every 4 (four) hours as needed for severe pain. 12 tablet 0   pantoprazole  (PROTONIX ) 20 MG tablet Take 20 mg by mouth every evening.     predniSONE  (DELTASONE ) 10 MG tablet Take 2 tablets (20 mg total) by mouth daily. (Patient taking differently: Take 20 mg by mouth as needed.) 20 tablet 0    telmisartan  (MICARDIS ) 40 MG tablet Take 1 tablet (40 mg total) by mouth daily. 90 tablet 3   traMADol  (ULTRAM ) 50 MG tablet Take 2 tablets (100 mg total) by mouth every 6 (six) hours as needed for severe pain. (Patient taking differently: Take 50 mg by mouth every 6 (six) hours as needed for severe pain (pain score 7-10).) 30 tablet 0   Zinc  Acetate, Oral, (ZINC  ACETATE PO) Take 1 tablet by mouth daily. Received from herbalist     No current facility-administered medications for this visit.    SURGICAL HISTORY:  Past Surgical History:  Procedure Laterality Date   APPENDECTOMY     APPLICATION OF CRANIAL NAVIGATION N/A 07/11/2022   Procedure: APPLICATION OF CRANIAL NAVIGATION;  Surgeon: Dawley, Lani BROCKS, DO;  Location: [facility] OR;  Service: Neurosurgery;  Laterality: N/A;   BRONCHIAL  BIOPSY  06/12/2021   Procedure: BRONCHIAL BIOPSIES;  Surgeon: Brenna Adine CROME, DO;  Location: [facility] ENDOSCOPY;  Service: Pulmonary;;   BRONCHIAL BRUSHINGS  06/12/2021   Procedure: BRONCHIAL BRUSHINGS;  Surgeon: Brenna Adine CROME, DO;  Location: [facility];  Service: Pulmonary;;   BRONCHIAL WASHINGS  06/12/2021   Procedure: BRONCHIAL WASHINGS;  Surgeon: Brenna Adine CROME, DO;  Location: [facility];  Service: Pulmonary;;   CHOLECYSTECTOMY     COLON RESECTION     CRANIOTOMY N/A 07/11/2022   Procedure: Craniotomy for resection of tumor;  Surgeon: Carollee Lani BROCKS, DO;  Location: [facility] OR;  Service: Neurosurgery;  Laterality: N/A;  RM 19   FIDUCIAL MARKER PLACEMENT  06/12/2021   Procedure: FIDUCIAL MARKER PLACEMENT;  Surgeon: Brenna Adine CROME, DO;  Location: [facility];  Service: Pulmonary;;   hernia     x 2   IR IMAGING GUIDED PORT INSERTION  10/11/2022   TONSILLECTOMY     VIDEO BRONCHOSCOPY WITH ENDOBRONCHIAL NAVIGATION Bilateral 06/12/2021   Procedure: VIDEO BRONCHOSCOPY WITH ENDOBRONCHIAL NAVIGATION;  Surgeon: Brenna Adine CROME, DO;  Location: [facility];  Service: Pulmonary;  Laterality: Bilateral;  ION   VIDEO  BRONCHOSCOPY WITH RADIAL ENDOBRONCHIAL ULTRASOUND  06/12/2021   Procedure: RADIAL ENDOBRONCHIAL ULTRASOUND;  Surgeon: Brenna Adine CROME, DO;  Location: [facility];  Service: Pulmonary;;    REVIEW OF SYSTEMS:  Constitutional: positive for fatigue Eyes: negative Ears, nose, mouth, throat, and face: negative Respiratory: positive for cough and dyspnea on exertion Cardiovascular: negative Gastrointestinal: negative Genitourinary:negative Integument/breast: negative Hematologic/lymphatic: negative Musculoskeletal:positive for arthralgias and muscle weakness Neurological: negative Behavioral/Psych: positive for anxiety Endocrine: negative Allergic/Immunologic: negative   PHYSICAL EXAMINATION: General appearance: alert, cooperative, fatigued, and no distress Head: Normocephalic, without obvious abnormality, atraumatic Neck: no adenopathy, no JVD, supple, symmetrical, trachea midline, and thyroid  not enlarged, symmetric, no tenderness/mass/nodules Lymph nodes: Cervical, supraclavicular, and axillary nodes normal. Resp: rhonchi bilaterally Back: symmetric, no curvature. ROM normal. No CVA tenderness. Cardio: regular rate and rhythm, S1, S2 normal, no murmur, click, rub or gallop GI: soft, non-tender; bowel sounds normal; no masses,  no organomegaly Extremities: extremities normal, atraumatic, no cyanosis or edema Neurologic: Alert and oriented X 3, normal strength and tone. Normal symmetric reflexes. Normal coordination and gait  ECOG PERFORMANCE STATUS: 1 - Symptomatic but completely ambulatory  Blood pressure 134/79, pulse (!) 111, temperature 97.9 F (36.6 C), temperature source Temporal, resp. rate 17, height 5' 5.5 (1.664 m), SpO2 93%.  LABORATORY DATA: Lab Results  Component Value Date   WBC 7.1 04/16/2024   HGB 11.6 (L) 04/16/2024   HCT 36.2 04/16/2024   MCV 90.3 04/16/2024   PLT 234 04/16/2024      Chemistry      Component Value Date/Time   NA 136 04/16/2024 1151    NA 139 09/17/2019 1550   K 4.2 04/16/2024 1151   CL 102 04/16/2024 1151   CO2 28 04/16/2024 1151   BUN 14 04/16/2024 1151   BUN 9 09/17/2019 1550   CREATININE 0.44 04/16/2024 1151      Component Value Date/Time   CALCIUM  8.8 (L) 04/16/2024 1151   ALKPHOS 70 04/16/2024 1151   AST 9 (L) 04/16/2024 1151   ALT 11 04/16/2024 1151   BILITOT 0.6 04/16/2024 1151       RADIOGRAPHIC STUDIES: MR BRAIN W WO CONTRAST Result Date: 04/25/2024 EXAM: MRI BRAIN WITH AND WITHOUT CONTRAST 04/22/2024 02:45:57 PM TECHNIQUE: Multiplanar multisequence MRI of the head/brain was performed with and without the administration of intravenous contrast. 10mL  gadobutrol  (GADAVIST ) 1 MMOL/ML injection 10 mL GADOBUTROL  1 MMOL/ML IV SOLN. COMPARISON: 08/05/2023 CLINICAL HISTORY: FINDINGS: BRAIN AND VENTRICLES: No acute infarct. No acute intracranial hemorrhage. No mass effect or midline shift. No hydrocephalus. The sella is unremarkable. Normal flow voids. No mass or abnormal enhancement. Chronic left parietal periventricular hemorrhage. Right occipital encephalomalacia. 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 Remote right craniotomy. ORBITS: No acute abnormality. SINUSES: Right mastoid effusion. BONES AND SOFT TISSUES: Normal bone marrow signal and enhancement. No acute soft tissue abnormality. IMPRESSION: 1. No acute intracranial abnormality. 2. Chronic left parietal periventricular hemorrhage and right occipital encephalomalacia. 3. 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 Electronically signed by: Franky Stanford MD 04/25/2024 10:11 PM EDT RP Workstation: [id number]   CT Chest W Contrast Result Date: 04/18/2024 EXAM:  CT CHEST WITH IV CONTRAST INDICATION:  Non-small cell lung cancer (NSCLC), staging TECHNIQUE: Spiral CT scanning was performed through the  chest after the patient received IV contrast. COMPARISON: 11/05/2023 CTA, 01/08/2024 PET/CT FINDINGS: The heart size is within normal limits. No pericardial effusion is present. There is no hilar, mediastinal, or axillary adenopathy. The small left pleural effusion has not changed significantly. The left thyroid  nodules are stable. There is stable left-sided volume loss. Areas of dense airspace opacification have developed involving most of the lingula. There are associated air bronchograms with a lobulated appearance. This also involves the superior segment of the left lower lobe. Atelectasis is present in the posterior inferior left lower lobe. Patchy groundglass attenuation has developed in the posterior right lower lobe, nonspecific. 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 There is no developing mass on the right. The esophagus and thyroid  gland have a normal appearance. A right-sided chest wall port is present. T3, T4 and T6 compression deformities are present with no significant retropulsion. IMPRESSION: 1. Increasing dense airspace opacification with lobulated air bronchograms. This is presumably due to radiation therapy. Concurrent tumor extension or pneumonia cannot be excluded. 2. Stable small left pleural effusion. 3. No developing metastatic disease is identified. Please note that CT scanning at this site utilizes multiple dose reduction techniques, including automatic exposure control, and adjustment of the MAA and/or KVP according to the patient's size. Electronically signed by: Eddy Oar MD 04/18/2024 03:36 PM EDT RP Workstation: [id number]   CT ABDOMEN PELVIS W CONTRAST Result Date: 04/18/2024 EXAM:  CT ABDOMEN PELVIS WITH IV CONTRAST INDICATION:  Non-small cell lung cancer (NSCLC), staging TECHNIQUE: Spiral CT scanning was performed through the abdomen and pelvis after the patient received IV contrast. COMPARISON: 12/06/2022 CT, 01/08/2024 PET/CT FINDINGS: No hepatic  masses have developed. The gallbladder is surgically absent. The spleen and pancreas are within normal limits. Small nonobstructive calculi are present in both kidneys. There are no adrenal masses. The abdominal bowel loops are unremarkable. There is no evidence of ascites or adenopathy. No abdominal aortic aneurysm is present. Persistent fluid is present in the endometrial cavity. The bladder has a normal appearance. There is no adnexal mass. There is a surgical anastomosis in the distal sigmoid colon. A stable right anterior pelvic wall hernia is present containing a small portion of nonobstructed colon. A small fat-containing hernia is present cranial to this. There is no acute fracture or bone destruction. Stable mid and upper thoracic compression deformities are present. IMPRESSION: 1. No evidence of acute process. No evidence of developing metastatic disease in the abdomen or  pelvis. 2. Stable nonacute findings as described above. Please note that CT scanning at this site utilizes multiple dose reduction techniques, including automatic exposure control, adjustment of the MAA and/or KVP according to the patient's size, and use of iterative reconstruction. Electronically signed by: Eddy Oar MD 04/18/2024 03:25 PM EDT RP Workstation: [id number]    ASSESSMENT AND PLAN: This is a 83 years old white female with Stage IV (T2 a, N3, M1b) non-small cell lung cancer, adenocarcinoma presented with left upper lobe perihilar mass in addition to left hilar and mediastinal lymphadenopathy as well as left supraclavicular lymphadenopathy with brain metastasis diagnosed in September 2023.   . The patient is status post SRS to brain metastasis followed by craniotomy followed by palliative radiotherapy to the left lung mass.  She was then started on systemic chemotherapy initially with carboplatin , Alimta  and Keytruda  for 4 cycles followed by maintenance treatment with Alimta  and Keytruda  starting from cycle #5 for a  total of 17 cycles.  She has been off treatment since late January 2025 secondary to intolerance. She had repeat PET scan performed recently that showed no concerning findings for disease progression and significant improvement of her disease compared to the initial studies. The patient is currently on observation and she is feeling fine except for the baseline fatigue. She had repeat CT scan of the chest, abdomen and pelvis performed recently.  I personally and independently reviewed the scan and discussed the result with the patient and her husband. Her scan showed no concerning findings for disease progression. Assessment and Plan Assessment & Plan Stage 4 non-small cell lung cancer, adenocarcinoma with positive KRAS G12C mutation Initially diagnosed in September 2023. Status post stereotactic radiosurgery to brain metastasis and 17 cycles of chemotherapy, discontinued in January 2025 due to intolerance. Recent CT scan shows no evidence of disease progression, only scarring from previous radiation. - No current treatment necessary as the disease is stable. - Schedule follow-up in three months for re-evaluation and repeat scan.  Chronic obstructive pulmonary disease (COPD) COPD is currently a more significant issue than the cancer, contributing to compromised breathing. - Coordinate with primary care physician for potential cardiopulmonary rehabilitation. - Investigate options for in-home physical therapy to address weakness and sedentary lifestyle. She was advised to call immediately if she has any concerning symptoms in the interval. The patient voices understanding of current disease status and treatment options and is in agreement with the current care plan.  All questions were answered. The patient knows to call the clinic with any problems, questions or concerns. We can certainly see the patient much sooner if necessary.  The total time spent in the appointment was 35  minutes.  Disclaimer: This note was dictated with voice recognition software. Similar sounding words can inadvertently be transcribed and may not be corrected upon review.

## 2024-04-28 ENCOUNTER — Telehealth: Payer: Self-pay | Admitting: Internal Medicine

## 2024-04-28 ENCOUNTER — Other Ambulatory Visit: Payer: Self-pay | Admitting: Radiation Therapy

## 2024-04-28 NOTE — Telephone Encounter (Signed)
Scheduled appointments with the patient

## 2024-04-30 ENCOUNTER — Encounter: Payer: Self-pay | Admitting: Radiology

## 2024-04-30 ENCOUNTER — Ambulatory Visit
Admission: RE | Admit: 2024-04-30 | Discharge: 2024-04-30 | Disposition: A | Discharge status: Home / Self Care | Source: Ambulatory Visit | Attending: Radiology | Admitting: Radiology

## 2024-04-30 DIAGNOSIS — C7931 Secondary malignant neoplasm of brain: Secondary | ICD-10-CM | POA: Insufficient documentation

## 2024-04-30 DIAGNOSIS — Z7952 Long term (current) use of systemic steroids: Secondary | ICD-10-CM | POA: Insufficient documentation

## 2024-04-30 DIAGNOSIS — Z79899 Other long term (current) drug therapy: Secondary | ICD-10-CM | POA: Insufficient documentation

## 2024-04-30 DIAGNOSIS — Z7901 Long term (current) use of anticoagulants: Secondary | ICD-10-CM | POA: Diagnosis not present

## 2024-04-30 DIAGNOSIS — Z923 Personal history of irradiation: Secondary | ICD-10-CM | POA: Insufficient documentation

## 2024-04-30 DIAGNOSIS — C3412 Malignant neoplasm of upper lobe, left bronchus or lung: Secondary | ICD-10-CM | POA: Diagnosis not present

## 2024-04-30 NOTE — Progress Notes (Signed)
 Laurie Mccoy presents today for follow-up after completion of SRS to her brain on 10/23/2022.  Patient had MRI brain on 04/22/2024 and is here to discuss those results.   Patient states overall she is doing well, no complaints or concerns today.

## 2024-04-30 NOTE — Progress Notes (Addendum)
 Radiation Oncology         [phone]) [phone] ________________________________  Name: Laurie Mccoy MRN: [id number]  Date: 04/30/2024  DOB: Jan 28, 1941  Follow-Up Visit Note - Conducted via telephone at patient request.   I spoke with the patient to conduct this consult visit via telephone. The patient was notified in advance and was offered an in person or telemedicine meeting to allow for face to face communication but instead preferred to proceed with a telephone follow-up.   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 Metastasis to brain [facility])  C79.31       CHIEF COMPLAINT: Here for MRI results  Narrative:   Laurie Mccoy presents today for follow-up after completing SRS to her brain on 10/23/2022  Pt had MRI on 04/22/2024, we will review these results with pt.   Patient reports to be doing well overall today.  She denies any headaches, nausea, visual or auditory disturbances, difficulty with ambulation, or any other neurologic symptoms.  She most recently met with Dr. Gatha on 04/26/2024.  At that time, we reviewed her most recent CT scan of the chest, abdomen, and pelvis demonstrated no concerning findings for disease progression.  Per Dr. Jeannett recommendations, the patient proceeded with active surveillance.  She is scheduled to see him next on 07/27/2024 to review her next.   ALLERGIES:  is allergic to compazine  [prochlorperazine ], norvasc  [amlodipine ], and trelegy ellipta  [fluticasone -umeclidin-vilant].  Meds: Current Outpatient Medications  Medication Sig Dispense Refill   albuterol  (VENTOLIN  HFA) 108 (90 Base) MCG/ACT inhaler Inhale 2 puffs into the lungs every 6 (six) hours as needed for wheezing or shortness of breath. 18 g 0   ALPRAZolam  (XANAX ) 0.25 MG tablet Take 0.25 mg by mouth at bedtime as needed for anxiety.     Budeson-Glycopyrrol-Formoterol   (BREZTRI  AEROSPHERE) 160-9-4.8 MCG/ACT AERO INHALE 2 PUFFS BY MOUTH EVERY MORNING AND EVERY NIGHT AT BEDTIME (Patient taking differently: Inhale 2 puffs into the lungs in the morning and at bedtime.) 10.7 g 5   ELIQUIS  5 MG TABS tablet Take 1 tablet (5 mg total) by mouth 2 (two) times daily. 60 tablet 3   folic acid  (FOLVITE ) 1 MG tablet TAKE ONE TABLET BY MOUTH EVERY DAY 30 tablet 2   furosemide  (LASIX ) 20 MG tablet 1 tablet p.o. daily as needed for increased leg swelling. 20 tablet 0   gabapentin  (NEURONTIN ) 100 MG capsule Take 1 capsule (100 mg total) by mouth 2 (two) times daily. 90 capsule 2   hydrOXYzine  (ATARAX ) 10 MG tablet Take 1 tablet (10 mg total) by mouth 3 (three) times daily as needed. 60 tablet 2   levocetirizine (XYZAL) 5 MG tablet Take 5 mg by mouth at bedtime as needed (for seasonal allergies- if not taking generic Claritin ).     lidocaine -prilocaine  (EMLA ) cream Apply 1 Application topically as needed. 30 g 2   LORazepam  (ATIVAN ) 1 MG tablet Take 1 tablet (1 mg total) by mouth as needed. 30 min Prior to MRI or brain radiation procedure 5 tablet 0   methocarbamol  (ROBAXIN ) 500 MG tablet Take 1 tablet (500 mg total) by mouth every 8 (eight) hours as needed for muscle spasms. 20 tablet 0   ondansetron  (ZOFRAN ) 8 MG tablet Take 1 tablet (8 mg total) by mouth every 8 (eight) hours as needed for nausea or vomiting. 30 tablet 2  oxyCODONE  (OXY IR/ROXICODONE ) 5 MG immediate release tablet Take 1 tablet (5 mg total) by mouth every 4 (four) hours as needed for severe pain. 12 tablet 0   pantoprazole  (PROTONIX ) 20 MG tablet Take 20 mg by mouth every evening.     predniSONE  (DELTASONE ) 10 MG tablet Take 2 tablets (20 mg total) by mouth daily. (Patient taking differently: Take 20 mg by mouth as needed.) 20 tablet 0   telmisartan  (MICARDIS ) 40 MG tablet Take 1 tablet (40 mg total) by mouth daily. 90 tablet 3   traMADol  (ULTRAM ) 50 MG tablet Take 2 tablets (100 mg total) by mouth every 6 (six)  hours as needed for severe pain. (Patient taking differently: Take 50 mg by mouth every 6 (six) hours as needed for severe pain (pain score 7-10).) 30 tablet 0   Zinc  Acetate, Oral, (ZINC  ACETATE PO) Take 1 tablet by mouth daily. Received from herbalist     ipratropium-albuterol  (DUONEB) 0.5-2.5 (3) MG/3ML SOLN Take 3 mLs by nebulization every 6 (six) hours as needed. (Patient not taking: Reported on 04/30/2024) 360 mL 0   No current facility-administered medications for this encounter.    Physical Findings: The patient is in no acute distress. Patient is alert and oriented.  vitals were not taken for this visit. .  No significant changes. Deferred, due to of the telephone visit  KPS = 60  100 - Normal; no complaints; no evidence of disease. 90   - Able to carry on normal activity; minor signs or symptoms of disease. 80   - Normal activity with effort; some signs or symptoms of disease. 56   - Cares for self; unable to carry on normal activity or to do active work. 60   - Requires occasional assistance, but is able to care for most of his personal needs. 50   - Requires considerable assistance and frequent medical care. 40   - Disabled; requires special care and assistance. 30   - Severely disabled; hospital admission is indicated although death not imminent. 20   - Very sick; hospital admission necessary; active supportive treatment necessary. 10   - Moribund; fatal processes progressing rapidly. 0     - Dead  Karnofsky DA, Abelmann WH, Craver LS and Burchenal [facility] [phone]) The use of the nitrogen mustards in the palliative treatment of carcinoma: with particular reference to bronchogenic carcinoma Cancer 1 634-56  Lab Findings: Lab Results  Component Value Date   WBC 7.1 04/16/2024   HGB 11.6 (L) 04/16/2024   HCT 36.2 04/16/2024   MCV 90.3 04/16/2024   PLT 234 04/16/2024    Radiographic Findings: MR BRAIN W WO CONTRAST Result Date: 04/25/2024 EXAM: MRI BRAIN WITH AND WITHOUT  CONTRAST 04/22/2024 02:45:57 PM TECHNIQUE: Multiplanar multisequence MRI of the head/brain was performed with and without the administration of intravenous contrast. 10mL gadobutrol  (GADAVIST ) 1 MMOL/ML injection 10 mL GADOBUTROL  1 MMOL/ML IV SOLN. COMPARISON: 08/05/2023 CLINICAL HISTORY: FINDINGS: BRAIN AND VENTRICLES: No acute infarct. No acute intracranial hemorrhage. No mass effect or midline shift. No hydrocephalus. The sella is unremarkable. Normal flow voids. No mass or abnormal enhancement. Chronic left parietal periventricular hemorrhage. Right occipital encephalomalacia. 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 Remote right craniotomy. ORBITS: No acute abnormality. SINUSES: Right mastoid effusion. BONES AND SOFT TISSUES: Normal bone marrow signal and enhancement. No acute soft tissue abnormality. IMPRESSION: 1. No acute intracranial abnormality. 2. Chronic left parietal periventricular hemorrhage and right occipital encephalomalacia.  3. 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 Electronically signed by: Franky Stanford MD 04/25/2024 10:11 PM EDT RP Workstation: [id number]   CT Chest W Contrast Result Date: 04/18/2024 EXAM:  CT CHEST WITH IV CONTRAST INDICATION:  Non-small cell lung cancer (NSCLC), staging TECHNIQUE: Spiral CT scanning was performed through the chest after the patient received IV contrast. COMPARISON: 11/05/2023 CTA, 01/08/2024 PET/CT FINDINGS: The heart size is within normal limits. No pericardial effusion is present. There is no hilar, mediastinal, or axillary adenopathy. The small left pleural effusion has not changed significantly. The left thyroid  nodules are stable. There is stable left-sided volume loss. Areas of dense airspace opacification have developed involving most of the lingula. There are associated air  bronchograms with a lobulated appearance. This also involves the superior segment of the left lower lobe. Atelectasis is present in the posterior inferior left lower lobe. Patchy groundglass attenuation has developed in the posterior right lower lobe, nonspecific. 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 There is no developing mass on the right. The esophagus and thyroid  gland have a normal appearance. A right-sided chest wall port is present. T3, T4 and T6 compression deformities are present with no significant retropulsion. IMPRESSION: 1. Increasing dense airspace opacification with lobulated air bronchograms. This is presumably due to radiation therapy. Concurrent tumor extension or pneumonia cannot be excluded. 2. Stable small left pleural effusion. 3. No developing metastatic disease is identified. Please note that CT scanning at this site utilizes multiple dose reduction techniques, including automatic exposure control, and adjustment of the MAA and/or KVP according to the patient's size. Electronically signed by: Eddy Oar MD 04/18/2024 03:36 PM EDT RP Workstation: [id number]   CT ABDOMEN PELVIS W CONTRAST Result Date: 04/18/2024 EXAM:  CT ABDOMEN PELVIS WITH IV CONTRAST INDICATION:  Non-small cell lung cancer (NSCLC), staging TECHNIQUE: Spiral CT scanning was performed through the abdomen and pelvis after the patient received IV contrast. COMPARISON: 12/06/2022 CT, 01/08/2024 PET/CT FINDINGS: No hepatic masses have developed. The gallbladder is surgically absent. The spleen and pancreas are within normal limits. Small nonobstructive calculi are present in both kidneys. There are no adrenal masses. The abdominal bowel loops are unremarkable. There is no evidence of ascites or adenopathy. No abdominal aortic aneurysm is present. Persistent fluid is present in the endometrial cavity. The bladder has a normal appearance. There is no adnexal mass. There is a surgical anastomosis in  the distal sigmoid colon. A stable right anterior pelvic wall hernia is present containing a small portion of nonobstructed colon. A small fat-containing hernia is present cranial to this. There is no acute fracture or bone destruction. Stable mid and upper thoracic compression deformities are present. IMPRESSION: 1. No evidence of acute process. No evidence of developing metastatic disease in the abdomen or pelvis. 2. Stable nonacute findings as described above. Please note that CT scanning at this site utilizes multiple dose reduction techniques, including automatic exposure control, adjustment of the MAA and/or KVP according to the patient's size, and use of iterative reconstruction. Electronically signed by: Eddy Oar MD 04/18/2024 03:25 PM EDT RP Workstation: [id number]    Impression/Plan:   Dr. Izell personally reviewed the results of her most recent MRI scan.  Images show a new LEFT frontal lesion, concerning for disease progression.  Dr. Izell anticipates a close follow-up with MRI of the brain in 6 weeks. We will review her case at our tumor board on 05/03/2024. We will share our  discussion with the patient and confirm the follow-up plan at that time. She expressed understanding and is in agreement with this plan.  She will continue under active surveillance under the care of Dr. Gatha.  She is scheduled to see him after her next restaging scans at the end of October.     This encounter was conducted via telephone.  The patient has provided two factor identification and has given verbal consent for this type of encounter and has been advised to only accept a meeting of this type in a secure network environment.  The time spent during this encounter was 20 minutes including preparation, discussion, and coordination of the patient's care  The attendants for this meeting include Leeroy Due PA-C, and patient During the encounter Leeroy Due PA-C was located at [facility]  Radiation Oncology Department.  Patient was located at home. _____________________________________     Leeroy Due, PA-C

## 2024-05-03 ENCOUNTER — Encounter

## 2024-05-05 ENCOUNTER — Telehealth: Payer: Self-pay | Admitting: Radiology

## 2024-05-05 ENCOUNTER — Other Ambulatory Visit: Payer: Self-pay | Admitting: Radiation Therapy

## 2024-05-05 ENCOUNTER — Encounter: Payer: Self-pay | Admitting: Internal Medicine

## 2024-05-05 DIAGNOSIS — C7931 Secondary malignant neoplasm of brain: Secondary | ICD-10-CM

## 2024-05-05 NOTE — Addendum Note (Signed)
 Encounter addended by: Wyatt Leeroy HERO, PA-C on: 05/05/2024 9:46 AM  Actions taken: Clinical Note Signed

## 2024-05-05 NOTE — Telephone Encounter (Signed)
 I called the patient to share our discussion from CNS tumor board earlier this week. The consensus was to proceed with follow-up MRI in 6 weeks with SRS protocol for the suspicious LEFT frontal lobe. The patient will be called after her imaging is reviewed at our tumor board with the plan moving forward. She expressed understanding and is in agreement with the stated plan. She was encouraged to call with any questions or concerns in the meantime.     Leeroy Due, PA-C

## 2024-05-06 ENCOUNTER — Telehealth: Payer: Self-pay

## 2024-05-06 NOTE — Telephone Encounter (Signed)
 Patient is currently receiving physical therapy. Her therapist called and informed us  that she is scheduled for PT twice weekly for 4 weeks, then once weekly for 4 weeks. He reported that the patient appeared to have increased shortness of breath, fatigue, compared to two days ago.  Spoke with the patient to assess her condition. She stated that she is experiencing shortness of breath and has not been getting much exercise recently. Patient reported, I have been trying to wean myself off prednisone , but I likely shouldn't have. She stated she took a prednisone  dose this afternoon. Advised patient to seek evaluation in the ER if symptoms of shortness of breath worsen. Patient voiced understanding.

## 2024-05-18 ENCOUNTER — Telehealth: Payer: Self-pay | Admitting: Medical Oncology

## 2024-05-18 NOTE — Telephone Encounter (Signed)
 Patient reported right flank pain to [facility], her physical therapist, stating it began 2 days ago. Patient also reports increased shortness of breath with anxiety and movement.

## 2024-05-18 NOTE — Telephone Encounter (Signed)
 LVM for Laurie Mccoy to return my call to find out if she had exertional SOB and did it resolve after resting.

## 2024-06-02 ENCOUNTER — Other Ambulatory Visit: Payer: Self-pay | Admitting: Physician Assistant

## 2024-06-02 DIAGNOSIS — C3490 Malignant neoplasm of unspecified part of unspecified bronchus or lung: Secondary | ICD-10-CM

## 2024-06-02 NOTE — Telephone Encounter (Signed)
 No longer needed

## 2024-06-09 ENCOUNTER — Ambulatory Visit
Admission: RE | Admit: 2024-06-09 | Discharge: 2024-06-09 | Disposition: A | Discharge status: Home / Self Care | Source: Ambulatory Visit | Attending: Radiation Oncology | Admitting: Radiation Oncology

## 2024-06-09 DIAGNOSIS — C7931 Secondary malignant neoplasm of brain: Secondary | ICD-10-CM

## 2024-06-09 MED ORDER — GADOPICLENOL 0.5 MMOL/ML IV SOLN
8.5000 mL | Freq: Once | INTRAVENOUS | Status: AC | PRN
Start: 2024-06-09 — End: 2024-06-09
  Administered 2024-06-09: 8.5 mL via INTRAVENOUS

## 2024-06-14 ENCOUNTER — Inpatient Hospital Stay: Attending: Internal Medicine

## 2024-06-14 NOTE — Progress Notes (Signed)
 Radiation Oncology         [phone]) [phone] ________________________________  Name: Laurie Mccoy MRN: [id number]  Date: 06/15/2024  DOB: 03/02/1941  Follow-Up Visit Note - Conducted via telephone at patient request.   I spoke with the patient to conduct this consult visit via telephone. The patient was notified in advance and was offered an in person or telemedicine meeting to allow for face to face communication but instead preferred to proceed with a telephone follow-up.   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 ***  Previous Radiation:  Radiation Treatment Date: 07/10/2024 Site/dose:   Brain metastases/ 16-20 [place] in 1 fraction   Radiation Treatment Date: 10/23/2022 Plan Name: Brain_SRS_dca Site: Brain Technique: 3D Mode: Photon Dose Per Fraction: 20 Gy Prescribed Dose (Delivered / Prescribed): 20 Gy / 20 Gy Prescribed Fxs (Delivered / Prescribed): 1 / 1  Radiation Treatment Dates: 07/10/2022 through 08/23/2022 Site Technique Total Dose (Gy) Dose per Fx (Gy) Completed Fx Beam Energies  Lung, Left: Lung_L 3D 60/60 2 30/30 6X, 10X  Brain: Brain_SRS IMRT 16/16 16 1/1 6XFFF      CHIEF COMPLAINT: Here for MRI results  Narrative:   Laurie Mccoy presents today for follow-up after completing SRS to her brain on 10/23/2022 and 07/30/2024.  She is here to review the results of her brain MRI from 06/09/2024.  MRI of the brain on 06/09/2024 demonstrates an increase in size of the new central cavitation of the previously seen lesion in the left superior frontal gyrus, now measuring up to 1.4 cm; a new 0.3 cm enhancing lesion in the superior aspect of the left parietal lob; and unchanged focus of susceptibility along posterior horn of the left lateral ventricle, consistent with prior treated disease; and unchanged postoperative appearance of the right occipital resection  cavity.  ***  She most recently met with Dr. Gatha on 04/26/2024.  At that time, he reviewed her most recent CT scan of the chest, abdomen, and pelvis demonstrated no concerning findings for disease progression.  Per Dr. Jeannett recommendations, the patient proceeded with active surveillance.  She is scheduled to see him on 07/27/2024 to review her next restaging images.   ALLERGIES:  is allergic to compazine  [prochlorperazine ], norvasc  [amlodipine ], and trelegy ellipta  [fluticasone -umeclidin-vilant].  Meds: Current Outpatient Medications  Medication Sig Dispense Refill   albuterol  (VENTOLIN  HFA) 108 (90 Base) MCG/ACT inhaler Inhale 2 puffs into the lungs every 6 (six) hours as needed for wheezing or shortness of breath. 18 g 0   ALPRAZolam  (XANAX ) 0.25 MG tablet Take 0.25 mg by mouth at bedtime as needed for anxiety.     Budeson-Glycopyrrol-Formoterol  (BREZTRI  AEROSPHERE) 160-9-4.8 MCG/ACT AERO INHALE 2 PUFFS BY MOUTH EVERY MORNING AND EVERY NIGHT AT BEDTIME (Patient taking differently: Inhale 2 puffs into the lungs in the morning and at bedtime.) 10.7 g 5   ELIQUIS  5 MG TABS tablet Take 1 tablet (5 mg total) by mouth 2 (two) times daily. 60 tablet 3   folic acid  (FOLVITE ) 1 MG tablet TAKE ONE TABLET BY MOUTH EVERY DAY 30 tablet 2   furosemide  (LASIX ) 20 MG tablet 1 tablet p.o. daily as needed for increased leg swelling. 20 tablet 0   gabapentin  (NEURONTIN ) 100 MG capsule Take 1 capsule (100 mg total) by mouth 2 (two) times daily. 90 capsule 2   hydrOXYzine  (ATARAX ) 10 MG tablet Take 1 tablet (  10 mg total) by mouth 3 (three) times daily as needed. 60 tablet 2   ipratropium-albuterol  (DUONEB) 0.5-2.5 (3) MG/3ML SOLN Take 3 mLs by nebulization every 6 (six) hours as needed. (Patient not taking: Reported on 04/30/2024) 360 mL 0   levocetirizine (XYZAL) 5 MG tablet Take 5 mg by mouth at bedtime as needed (for seasonal allergies- if not taking generic Claritin ).     lidocaine -prilocaine  (EMLA )  cream Apply 1 Application topically as needed. 30 g 2   LORazepam  (ATIVAN ) 1 MG tablet Take 1 tablet (1 mg total) by mouth as needed. 30 min Prior to MRI or brain radiation procedure 5 tablet 0   methocarbamol  (ROBAXIN ) 500 MG tablet Take 1 tablet (500 mg total) by mouth every 8 (eight) hours as needed for muscle spasms. 20 tablet 0   ondansetron  (ZOFRAN ) 8 MG tablet Take 1 tablet (8 mg total) by mouth every 8 (eight) hours as needed for nausea or vomiting. 30 tablet 2   oxyCODONE  (OXY IR/ROXICODONE ) 5 MG immediate release tablet Take 1 tablet (5 mg total) by mouth every 4 (four) hours as needed for severe pain. 12 tablet 0   pantoprazole  (PROTONIX ) 20 MG tablet Take 20 mg by mouth every evening.     predniSONE  (DELTASONE ) 10 MG tablet Take 2 tablets (20 mg total) by mouth daily. (Patient taking differently: Take 20 mg by mouth as needed.) 20 tablet 0   telmisartan  (MICARDIS ) 40 MG tablet Take 1 tablet (40 mg total) by mouth daily. 90 tablet 3   traMADol  (ULTRAM ) 50 MG tablet Take 2 tablets (100 mg total) by mouth every 6 (six) hours as needed for severe pain. (Patient taking differently: Take 50 mg by mouth every 6 (six) hours as needed for severe pain (pain score 7-10).) 30 tablet 0   Zinc  Acetate, Oral, (ZINC  ACETATE PO) Take 1 tablet by mouth daily. Received from herbalist     No current facility-administered medications for this visit.    Physical Findings: The patient is in no acute distress. Patient is alert and oriented.  vitals were not taken for this visit. .  No significant changes. Deferred, due to of the telephone visit  KPS = 60  100 - Normal; no complaints; no evidence of disease. 90   - Able to carry on normal activity; minor signs or symptoms of disease. 80   - Normal activity with effort; some signs or symptoms of disease. 52   - Cares for self; unable to carry on normal activity or to do active work. 60   - Requires occasional assistance, but is able to care for most of  his personal needs. 50   - Requires considerable assistance and frequent medical care. 40   - Disabled; requires special care and assistance. 30   - Severely disabled; hospital admission is indicated although death not imminent. 20   - Very sick; hospital admission necessary; active supportive treatment necessary. 10   - Moribund; fatal processes progressing rapidly. 0     - Dead  Karnofsky DA, Abelmann WH, Craver LS and Burchenal [facility] [phone]) The use of the nitrogen mustards in the palliative treatment of carcinoma: with particular reference to bronchogenic carcinoma Cancer 1 634-56  Lab Findings: Lab Results  Component Value Date   WBC 7.1 04/16/2024   HGB 11.6 (L) 04/16/2024   HCT 36.2 04/16/2024   MCV 90.3 04/16/2024   PLT 234 04/16/2024    Radiographic Findings: MR Brain W Wo Contrast Result Date: 06/11/2024 EXAM: MRI  BRAIN WITH AND WITHOUT CONTRAST 06/09/2024 03:56:14 PM TECHNIQUE: Multiplanar multisequence MRI of the head/brain was performed with and without the administration of intravenous contrast. COMPARISON: MRI brain 04/22/2024 CLINICAL HISTORY: Brain metastases, assess treatment response; 3T SRS Protocol. Short interval scan. Srs brain wwo; 8.5 ml vueway ; 1.5 ml wasted; Assess treatment response; Pt unable to hold still bc of coughing FINDINGS: BRAIN AND VENTRICLES: Increased size with new central cavitation of the previously seen enhancing lesion in the left superior frontal gyrus, now measuring up to 14 x 10 mm on axial image 146 series 13. New 3 mm enhancing lesion in the superior aspect of the left parietal lobe seen on axial image 126 series 13. Unchanged focus of susceptibility along the posterior horn of the left lateral ventricle within periventricular white matter, consistent with prior treated disease. Unchanged postoperative appearances of the right occipital resection cavity with thin linear enhancement. Prior right occipital craniotomy. ORBITS: No acute abnormality.  SINUSES: No acute abnormality. BONES AND SOFT TISSUES: Normal bone marrow signal and enhancement. No acute soft tissue abnormality. IMPRESSION: 1. Increased size with new central cavitation of the previously seen enhancing lesion in the left superior frontal gyrus, now measuring up to 14 x 10 mm. 2. New 3 mm enhancing lesion in the superior aspect of the left parietal lobe. 3. Unchanged focus of susceptibility along the posterior horn of the left lateral ventricle within periventricular white matter, consistent with prior treated disease. 4. Unchanged postoperative appearance of the right occipital resection cavity with thin linear enhancement. 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  MRI demonstrates 2 new areas of enhancement, highly concerning for disease metastases.  These areas have confirmed to be new.  The consensus from tumor board was to proceed with stereotactic radiosurgery [facility]) to these areas.  Patient will be scheduled for CT simulation and SRS. ***  She will continue under active surveillance under the care of Dr. Gatha.  She is scheduled to see him after her next restaging scans at the end of October.     This encounter was conducted via telephone.  The patient has provided two factor identification and has given verbal consent for this type of encounter and has been advised to only accept a meeting of this type in a secure network environment.  The time spent during this encounter was 20 minutes including preparation, discussion, and coordination of the patient's care  The attendants for this meeting include Leeroy Due PA-C, and patient During the encounter Leeroy Due PA-C was located at  [place] Radiation Oncology Department.  Patient was located at home. *** _____________________________________     Leeroy Due, PA-C

## 2024-06-14 NOTE — Progress Notes (Incomplete)
 Name: Breslin Burklow        MRN: [id number]         Date: 06/15/2024                        DOB: 1941-02-11   Follow-Up Visit Note - Conducted via telephone at patient request.    Mrs. Reither presents today for follow-up after completing SRS to her brain on 10/23/2022 and 07/30/2024. She is here to review the results of her brain MRI from 06/09/2024.       Does the patient complain of any of the following: Headache: Every once in a while  Visual Changes: Denies Hearing Changes: Denies Nausea: Denies Vomiting: Denies Balance or coordination issues: She reports not stable to walk but using walker. Memory issues: Yes, but she contributes it to getting older.  Is the patient currently on a Decadron  regimen? :   Additional comments if applicable:Patient states overall she is doing well, no complaints or concerns today. She is scheduled to see Dr. Gatha on 07/27/2024 to review her next restaging images   No vitals were taken for this visit.

## 2024-06-15 ENCOUNTER — Other Ambulatory Visit: Payer: Self-pay | Admitting: Radiation Therapy

## 2024-06-15 ENCOUNTER — Ambulatory Visit
Admission: RE | Admit: 2024-06-15 | Discharge: 2024-06-15 | Disposition: A | Discharge status: Home / Self Care | Source: Ambulatory Visit | Attending: Radiology | Admitting: Radiology

## 2024-06-15 ENCOUNTER — Encounter: Payer: Self-pay | Admitting: Radiology

## 2024-06-15 DIAGNOSIS — Z79899 Other long term (current) drug therapy: Secondary | ICD-10-CM | POA: Insufficient documentation

## 2024-06-15 DIAGNOSIS — Z923 Personal history of irradiation: Secondary | ICD-10-CM | POA: Insufficient documentation

## 2024-06-15 DIAGNOSIS — Z7901 Long term (current) use of anticoagulants: Secondary | ICD-10-CM | POA: Insufficient documentation

## 2024-06-15 DIAGNOSIS — C7931 Secondary malignant neoplasm of brain: Secondary | ICD-10-CM

## 2024-06-15 DIAGNOSIS — F419 Anxiety disorder, unspecified: Secondary | ICD-10-CM | POA: Insufficient documentation

## 2024-06-15 DIAGNOSIS — Z888 Allergy status to other drugs, medicaments and biological substances status: Secondary | ICD-10-CM | POA: Insufficient documentation

## 2024-06-15 DIAGNOSIS — C3490 Malignant neoplasm of unspecified part of unspecified bronchus or lung: Secondary | ICD-10-CM | POA: Insufficient documentation

## 2024-06-15 DIAGNOSIS — C7932 Secondary malignant neoplasm of cerebral meninges: Secondary | ICD-10-CM | POA: Insufficient documentation

## 2024-06-15 MED ORDER — LORAZEPAM 1 MG PO TABS: 1.0000 mg | ORAL_TABLET | ORAL | 0 refills | Status: DC | PRN

## 2024-06-16 ENCOUNTER — Ambulatory Visit
Admission: RE | Admit: 2024-06-16 | Discharge: 2024-06-16 | Disposition: A | Discharge status: Home / Self Care | Source: Ambulatory Visit | Attending: Radiology | Admitting: Radiology

## 2024-06-16 ENCOUNTER — Other Ambulatory Visit: Payer: Self-pay

## 2024-06-16 ENCOUNTER — Ambulatory Visit: Admission: RE | Admit: 2024-06-16 | Source: Ambulatory Visit

## 2024-06-16 ENCOUNTER — Ambulatory Visit
Admission: RE | Admit: 2024-06-16 | Discharge: 2024-06-16 | Disposition: A | Discharge status: Home / Self Care | Source: Ambulatory Visit | Attending: Radiation Oncology | Admitting: Radiation Oncology

## 2024-06-16 VITALS — BP 140/73 | HR 99 | Temp 97.7°F | Resp 20 | Ht 65.5 in

## 2024-06-16 DIAGNOSIS — C3490 Malignant neoplasm of unspecified part of unspecified bronchus or lung: Secondary | ICD-10-CM

## 2024-06-16 DIAGNOSIS — Z7901 Long term (current) use of anticoagulants: Secondary | ICD-10-CM | POA: Diagnosis not present

## 2024-06-16 DIAGNOSIS — F419 Anxiety disorder, unspecified: Secondary | ICD-10-CM | POA: Diagnosis not present

## 2024-06-16 DIAGNOSIS — Z888 Allergy status to other drugs, medicaments and biological substances status: Secondary | ICD-10-CM | POA: Diagnosis not present

## 2024-06-16 DIAGNOSIS — C7931 Secondary malignant neoplasm of brain: Secondary | ICD-10-CM

## 2024-06-16 DIAGNOSIS — Z79899 Other long term (current) drug therapy: Secondary | ICD-10-CM | POA: Diagnosis not present

## 2024-06-16 DIAGNOSIS — Z923 Personal history of irradiation: Secondary | ICD-10-CM | POA: Diagnosis not present

## 2024-06-16 DIAGNOSIS — C7932 Secondary malignant neoplasm of cerebral meninges: Secondary | ICD-10-CM | POA: Diagnosis not present

## 2024-06-16 LAB — BASIC METABOLIC PANEL - CANCER CENTER ONLY
Anion gap: 6 (ref 5–15)
BUN: 21 mg/dL (ref 8–23)
CO2: 30 mmol/L (ref 22–32)
Calcium: 8.5 mg/dL — ABNORMAL LOW (ref 8.9–10.3)
Chloride: 101 mmol/L (ref 98–111)
Creatinine: 0.51 mg/dL (ref 0.44–1.00)
GFR, Estimated: >60 mL/min (ref >60)
Glucose, Bld: 203 mg/dL — ABNORMAL HIGH (ref 70–99)
Potassium: 4.1 mmol/L (ref 3.5–5.1)
Sodium: 137 mmol/L (ref 135–145)

## 2024-06-16 NOTE — Progress Notes (Addendum)
 Has armband been applied?  Yes.    Does patient have an allergy to IV contrast dye?: None   Has patient ever received premedication for IV contrast dye?: No.   Does patient take metformin?: No.  If patient does take metformin when was the last dose: N/A  Date of lab work: June 16, 2024 BUN: 21 CR: 0.51 eGfr: >60 Port  Has IV site been added to flowsheet? Yes  There were no vitals taken for this visit.

## 2024-06-16 NOTE — Addendum Note (Signed)
 Encounter addended by: Claudene Eudora GAILS, LPN on: 0/89/7974 1:19 PM  Actions taken: Flowsheet accepted

## 2024-06-18 DIAGNOSIS — C3490 Malignant neoplasm of unspecified part of unspecified bronchus or lung: Secondary | ICD-10-CM | POA: Diagnosis not present

## 2024-06-23 ENCOUNTER — Ambulatory Visit
Admission: RE | Admit: 2024-06-23 | Discharge: 2024-06-23 | Disposition: A | Discharge status: Home / Self Care | Source: Ambulatory Visit | Attending: Radiation Oncology | Admitting: Radiation Oncology

## 2024-06-23 ENCOUNTER — Other Ambulatory Visit: Payer: Self-pay

## 2024-06-23 DIAGNOSIS — C3490 Malignant neoplasm of unspecified part of unspecified bronchus or lung: Secondary | ICD-10-CM | POA: Diagnosis not present

## 2024-06-23 LAB — RAD ONC ARIA SESSION SUMMARY
Course Elapsed Days: 0
Plan Fractions Treated to Date: 1
Plan Prescribed Dose Per Fraction: 20 Gy
Plan Total Fractions Prescribed: 1
Plan Total Prescribed Dose: 20 Gy
Reference Point Dosage Given to Date: 20 Gy
Reference Point Session Dosage Given: 20 Gy
Session Number: 1

## 2024-06-23 NOTE — Progress Notes (Addendum)
 Ms. Laurie  Mccoy rested with us  for 30 minutes following SRS treatment.  Patient denies headache, dizziness, nausea, diplopia. Has been experiencing ringing in the ears for the past two years. Patient is feeling fatigued and weak. Understands to avoid strenuous activity for the next 24 hours and call [phone] with needs   Nurse monitoring complete status post radiation of SRS treatments. Patient without complaints. Patient denies new or worsening neurologic symptoms. Vitals stable. Instructed patient to avoid strenuous activity for the next 24 hours. (Instructed patient to not miss any of her decadron  doses). Instructed patient to call [phone] with needs related to treatment after hours or over the weekend. Patient verbalized understanding.  Temp 96.4 oral B/P 139/85 Pulse 112 Spo2:139/85 Respirations 20    Wt Readings from Last 3 Encounters:  11/12/23 197 lb 9.6 oz (89.6 kg)  11/05/23 194 lb (88 kg)  10/15/23 195 lb (88.5 kg)

## 2024-06-23 NOTE — Op Note (Signed)
  Name: Laurie Mccoy  MRN: [id number]  Date: 06/23/2024   DOB: [birth year]  Stereotactic Radiosurgery Operative Note  PRE-OPERATIVE DIAGNOSIS:  Multiple Brain Metastases, non-small cell lung CA  POST-OPERATIVE DIAGNOSIS:  Same  PROCEDURE:  Stereotactic Radiosurgery  SURGEON:  Gerldine JAYSON Maizes, MD  RADIATION ONCOLOGIST: Dr. Lauraine Golden, MD  NARRATIVE: The patient underwent a radiation treatment planning session in the radiation oncology simulation suite under the care of the radiation oncology physician and physicist.  I participated closely in the radiation treatment planning afterwards. The patient underwent planning CT which was fused to 3T high resolution MRI with 1 mm axial slices.  These images were fused on the planning system.  We contoured the gross target volumes and subsequently expanded this to yield the Planning Target Volume. I actively participated in the planning process.  I helped to define and review the target contours and also the contours of the optic pathway, eyes, brainstem and selected nearby organs at risk.  All the dose constraints for critical structures were reviewed and compared to AAPM Task Group 101.  The prescription dose conformity was reviewed.  I approved the plan electronically.    Accordingly, Milyn  Burgueno was brought to the TrueBeam stereotactic radiation treatment linac and placed in the custom immobilization mask.  The patient was aligned according to the IR fiducial markers with BrainLab Exactrac, then orthogonal x-rays were used in ExacTrac with the 6DOF robotic table and the shifts were made to align the patient  Shaletta  Kathleene received stereotactic radiosurgery to an Rx dose of 20Gy to both lesions uneventfully.    Lesions treated:  2  Left frontal Left parietal  Complex lesions treated:  0 (>3.5 cm, <55mm of optic path, or within the brainstem)   The detailed description of the procedure is recorded in the radiation oncology procedure  note.  I was present for the duration of the procedure.  DISPOSITION:  Following delivery, the patient was transported to nursing in stable condition and monitored for possible acute effects to be discharged to home in stable condition with follow-up in one month.  Gerldine JAYSON Maizes, MD 06/23/2024 12:38 PM

## 2024-06-24 NOTE — Radiation Completion Notes (Signed)
 Patient Name: Laurie Mccoy, Laurie Mccoy  MRN: [id number] Date of Birth: [birth year] Referring Physician: ALDONA PIZZA, M.D. Date of Service: 2024-06-24 Radiation Oncologist: Lauraine Golden, M.D. [facility] [place]                             RADIATION ONCOLOGY END OF TREATMENT NOTE     Diagnosis: C79.31 Secondary malignant neoplasm of brain Staging on 2022-06-21: Malignant neoplasm of unspecified part of unspecified bronchus or lung (HCC) T=cT2a, N=cN3, M=cM1b Intent: Palliative     ==========DELIVERED PLANS==========  First Treatment Date: 2024-06-23 Last Treatment Date: 2024-06-23   Plan Name: Brain_SRS Site: Brain Technique: SBRT/SRT-IMRT Mode: Photon Dose Per Fraction: 20 Gy Prescribed Dose (Delivered / Prescribed): 20 Gy / 20 Gy Prescribed Fxs (Delivered / Prescribed): 1 / 1     ==========ON TREATMENT VISIT DATES========== 2024-06-23     ==========UPCOMING VISITS========== 07/27/2024 [facility]-RADIATION ONC FOLLOW UP 30 Wyatt Leeroy HER[place], [place]  07/27/2024 [facility]-MED ONCOLOGY EST PT 15 Sherrod Sherrod, [place]  07/19/2024 [facility] ONCOLOGY LAB [facility] LAB  06/28/2024 [facility]-RADIATION ONC WEEKLY UT CHECK LINAC-SQUIRE        ==========APPENDIX - ON TREATMENT VISIT NOTES==========   See weekly On Treatment Notes in Epic for details in the Media tab (listed as Progress notes on the On Treatment Visit Dates listed above).

## 2024-06-25 ENCOUNTER — Ambulatory Visit: Admitting: Radiation Oncology

## 2024-06-28 ENCOUNTER — Ambulatory Visit

## 2024-06-28 ENCOUNTER — Other Ambulatory Visit: Payer: Self-pay

## 2024-06-28 ENCOUNTER — Ambulatory Visit: Admitting: Radiation Oncology

## 2024-07-03 ENCOUNTER — Other Ambulatory Visit: Payer: Self-pay | Admitting: Physician Assistant

## 2024-07-03 DIAGNOSIS — C3490 Malignant neoplasm of unspecified part of unspecified bronchus or lung: Secondary | ICD-10-CM

## 2024-07-05 NOTE — Telephone Encounter (Signed)
 No longer needed. No longer on [place] 

## 2024-07-06 ENCOUNTER — Other Ambulatory Visit: Payer: Self-pay | Admitting: Internal Medicine

## 2024-07-06 DIAGNOSIS — C3490 Malignant neoplasm of unspecified part of unspecified bronchus or lung: Secondary | ICD-10-CM

## 2024-07-07 ENCOUNTER — Encounter: Payer: Self-pay | Admitting: Internal Medicine

## 2024-07-19 ENCOUNTER — Inpatient Hospital Stay: Attending: Internal Medicine

## 2024-07-19 ENCOUNTER — Ambulatory Visit (HOSPITAL_COMMUNITY)
Admission: RE | Admit: 2024-07-19 | Discharge: 2024-07-19 | Disposition: A | Discharge status: Home / Self Care | Source: Ambulatory Visit | Attending: Internal Medicine | Admitting: Internal Medicine

## 2024-07-19 ENCOUNTER — Inpatient Hospital Stay

## 2024-07-19 DIAGNOSIS — I700 Atherosclerosis of aorta: Secondary | ICD-10-CM | POA: Insufficient documentation

## 2024-07-19 DIAGNOSIS — M8008XA Age-related osteoporosis with current pathological fracture, vertebra(e), initial encounter for fracture: Secondary | ICD-10-CM | POA: Insufficient documentation

## 2024-07-19 DIAGNOSIS — C3490 Malignant neoplasm of unspecified part of unspecified bronchus or lung: Secondary | ICD-10-CM | POA: Insufficient documentation

## 2024-07-19 DIAGNOSIS — Z888 Allergy status to other drugs, medicaments and biological substances status: Secondary | ICD-10-CM | POA: Insufficient documentation

## 2024-07-19 DIAGNOSIS — J4489 Other specified chronic obstructive pulmonary disease: Secondary | ICD-10-CM | POA: Diagnosis not present

## 2024-07-19 DIAGNOSIS — J432 Centrilobular emphysema: Secondary | ICD-10-CM | POA: Insufficient documentation

## 2024-07-19 DIAGNOSIS — I10 Essential (primary) hypertension: Secondary | ICD-10-CM | POA: Insufficient documentation

## 2024-07-19 DIAGNOSIS — Z87442 Personal history of urinary calculi: Secondary | ICD-10-CM | POA: Diagnosis not present

## 2024-07-19 DIAGNOSIS — Z9089 Acquired absence of other organs: Secondary | ICD-10-CM | POA: Insufficient documentation

## 2024-07-19 DIAGNOSIS — Z79899 Other long term (current) drug therapy: Secondary | ICD-10-CM | POA: Insufficient documentation

## 2024-07-19 DIAGNOSIS — Z923 Personal history of irradiation: Secondary | ICD-10-CM | POA: Insufficient documentation

## 2024-07-19 DIAGNOSIS — F419 Anxiety disorder, unspecified: Secondary | ICD-10-CM | POA: Diagnosis not present

## 2024-07-19 DIAGNOSIS — Z7901 Long term (current) use of anticoagulants: Secondary | ICD-10-CM | POA: Insufficient documentation

## 2024-07-19 DIAGNOSIS — E041 Nontoxic single thyroid nodule: Secondary | ICD-10-CM | POA: Diagnosis not present

## 2024-07-19 DIAGNOSIS — C3412 Malignant neoplasm of upper lobe, left bronchus or lung: Secondary | ICD-10-CM | POA: Diagnosis present

## 2024-07-19 DIAGNOSIS — I2510 Atherosclerotic heart disease of native coronary artery without angina pectoris: Secondary | ICD-10-CM | POA: Diagnosis not present

## 2024-07-19 DIAGNOSIS — Z9049 Acquired absence of other specified parts of digestive tract: Secondary | ICD-10-CM | POA: Insufficient documentation

## 2024-07-19 DIAGNOSIS — C7931 Secondary malignant neoplasm of brain: Secondary | ICD-10-CM | POA: Insufficient documentation

## 2024-07-19 DIAGNOSIS — J90 Pleural effusion, not elsewhere classified: Secondary | ICD-10-CM | POA: Diagnosis not present

## 2024-07-19 DIAGNOSIS — K439 Ventral hernia without obstruction or gangrene: Secondary | ICD-10-CM | POA: Diagnosis not present

## 2024-07-19 LAB — CMP (CANCER CENTER ONLY)
ALT: 10 U/L (ref 0–44)
AST: <10 U/L — ABNORMAL LOW (ref 15–41)
Albumin: 3.7 g/dL (ref 3.5–5.0)
Alkaline Phosphatase: 76 U/L (ref 38–126)
Anion gap: 5 (ref 5–15)
BUN: 20 mg/dL (ref 8–23)
CO2: 29 mmol/L (ref 22–32)
Calcium: 9.1 mg/dL (ref 8.9–10.3)
Chloride: 102 mmol/L (ref 98–111)
Creatinine: 0.46 mg/dL (ref 0.44–1.00)
GFR, Estimated: >60 mL/min (ref >60)
Glucose, Bld: 145 mg/dL — ABNORMAL HIGH (ref 70–99)
Potassium: 4.2 mmol/L (ref 3.5–5.1)
Sodium: 136 mmol/L (ref 135–145)
Total Bilirubin: 0.7 mg/dL (ref 0.0–1.2)
Total Protein: 6.3 g/dL — ABNORMAL LOW (ref 6.5–8.1)

## 2024-07-19 LAB — CBC WITH DIFFERENTIAL (CANCER CENTER ONLY)
Abs Immature Granulocytes: 0.03 K/uL (ref 0.00–0.07)
Basophils Absolute: 0 K/uL (ref 0.0–0.1)
Basophils Relative: 0 %
Eosinophils Absolute: 0 K/uL (ref 0.0–0.5)
Eosinophils Relative: 1 %
HCT: 38.2 % (ref 36.0–46.0)
Hemoglobin: 12.7 g/dL (ref 12.0–15.0)
Immature Granulocytes: 0 %
Lymphocytes Relative: 9 %
Lymphs Abs: 0.7 K/uL (ref 0.7–4.0)
MCH: 30.2 pg (ref 26.0–34.0)
MCHC: 33.2 g/dL (ref 30.0–36.0)
MCV: 91 fL (ref 80.0–100.0)
Monocytes Absolute: 0.4 K/uL (ref 0.1–1.0)
Monocytes Relative: 5 %
Neutro Abs: 6.2 K/uL (ref 1.7–7.7)
Neutrophils Relative %: 85 %
Platelet Count: 223 K/uL (ref 150–400)
RBC: 4.2 MIL/uL (ref 3.87–5.11)
RDW: 14 % (ref 11.5–15.5)
WBC Count: 7.3 K/uL (ref 4.0–10.5)
nRBC: 0 % (ref 0.0–0.2)

## 2024-07-19 MED ORDER — IOHEXOL 300 MG/ML  SOLN
100.0000 mL | Freq: Once | INTRAMUSCULAR | Status: AC | PRN
  Administered 2024-07-19: 100 mL via INTRAVENOUS

## 2024-07-19 MED ORDER — HEPARIN SOD (PORK) LOCK FLUSH 100 UNIT/ML IV SOLN: 500.0000 [IU] | Freq: Once | INTRAVENOUS | Status: DC

## 2024-07-19 MED ORDER — SODIUM CHLORIDE (PF) 0.9 % IJ SOLN
INTRAMUSCULAR | Status: AC
  Filled 2024-07-19: qty 50

## 2024-07-27 ENCOUNTER — Encounter: Payer: Self-pay | Admitting: Radiology

## 2024-07-27 ENCOUNTER — Ambulatory Visit
Admission: RE | Admit: 2024-07-27 | Discharge: 2024-07-27 | Disposition: A | Discharge status: Home / Self Care | Source: Ambulatory Visit | Attending: Radiology | Admitting: Radiology

## 2024-07-27 ENCOUNTER — Inpatient Hospital Stay: Admitting: Internal Medicine

## 2024-07-27 VITALS — BP 132/67 | HR 105 | Temp 98.0°F | Resp 17 | Ht 65.0 in | Wt 183.0 lb

## 2024-07-27 DIAGNOSIS — C3412 Malignant neoplasm of upper lobe, left bronchus or lung: Secondary | ICD-10-CM | POA: Diagnosis not present

## 2024-07-27 DIAGNOSIS — C7931 Secondary malignant neoplasm of brain: Secondary | ICD-10-CM | POA: Insufficient documentation

## 2024-07-27 DIAGNOSIS — F419 Anxiety disorder, unspecified: Secondary | ICD-10-CM | POA: Insufficient documentation

## 2024-07-27 DIAGNOSIS — C3490 Malignant neoplasm of unspecified part of unspecified bronchus or lung: Secondary | ICD-10-CM

## 2024-07-27 MED ORDER — LORAZEPAM 1 MG PO TABS: 1.0000 mg | ORAL_TABLET | ORAL | 0 refills | Status: AC | PRN

## 2024-07-27 NOTE — Progress Notes (Signed)
 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 Secondary malignant neoplasm of brain (HCC)  C79.31 LORazepam  (ATIVAN ) 1 MG tablet    2. Anxiety  F41.9 LORazepam  (ATIVAN ) 1 MG tablet      Previous Radiation:  Radiation Treatment Date: 06/23/2024 Plan Name: Brain_SRS Site: Brain Technique: SBRT/SRT-IMRT Mode: Photon Dose Per Fraction: 20 Gy Prescribed Dose (Delivered / Prescribed): 20 Gy / 20 Gy Prescribed Fxs (Delivered / Prescribed): 1 / 1  Radiation Treatment Date: 10/23/2022 Plan Name: Brain_SRS_dca Site: Brain Technique: 3D Mode: Photon Dose Per Fraction: 20 Gy Prescribed Dose (Delivered / Prescribed): 20 Gy / 20 Gy Prescribed Fxs (Delivered / Prescribed): 1 / 1  Radiation Treatment Dates: 07/10/2022 through 08/23/2022 Site Technique Total Dose (Gy) Dose per Fx (Gy) Completed Fx Beam Energies  Lung, Left: Lung_L 3D 60/60 2 30/30 6X, 10X  Brain: Brain_SRS IMRT 16/16 16 1/1 6XFFF     CHIEF COMPLAINT: Here for MRI results  Narrative:   Laurie Mccoy presents today for follow-up after completing SRS to her brain on 10/23/2022 and 07/30/2024.  She is here to review the results of her brain MRI from 06/09/2024.  Patient reports to be doing well overall. She notes some issues with balance due to her peripheral neuropathy. She denies any changes to these issues since completing her radiation treatment. She is starting to taper her prednisone  for her breathing issues. She is currently taking 20 mg of prednisone  daily.   Headache: No Visual Changes: No Hearing Changes: No Nausea: No Vomiting: No Balance or coordination issues: yes, balance issues (consistent with baseline) Memory issues:  good   She most recently met with Dr. Gatha earlier today.  At that time, he reviewed her most recent CT scan of the chest, abdomen, and pelvis demonstrated no concerning findings for disease progression.  Per Dr. Jeannett recommendations, the patient proceeded with a follow-up scan in 3 months.    ALLERGIES:  is allergic to compazine  [prochlorperazine ], norvasc  [amlodipine ], and trelegy ellipta  [fluticasone -umeclidin-vilant].  Meds: Current Outpatient Medications  Medication Sig Dispense Refill   albuterol  (VENTOLIN  HFA) 108 (90 Base) MCG/ACT inhaler Inhale 2 puffs into the lungs every 6 (six) hours as needed for wheezing or shortness of breath. 18 g 0   ALPRAZolam  (XANAX ) 0.25 MG tablet Take 0.25 mg by mouth at bedtime as needed for anxiety.     Budeson-Glycopyrrol-Formoterol  (BREZTRI  AEROSPHERE) 160-9-4.8 MCG/ACT AERO INHALE 2 PUFFS BY MOUTH EVERY MORNING AND EVERY NIGHT AT BEDTIME (Patient taking differently: Inhale 2 puffs into the lungs in the morning and at bedtime.) 10.7 g 5   ELIQUIS  5 MG TABS tablet Take 1 tablet (5 mg total) by mouth 2 (two) times daily. 60 tablet 3   folic acid  (FOLVITE ) 1 MG tablet TAKE ONE TABLET BY MOUTH EVERY DAY 30 tablet 2   furosemide  (LASIX ) 20 MG tablet 1 tablet p.o. daily as needed for increased leg swelling. 20 tablet 0   gabapentin  (NEURONTIN ) 100 MG capsule Take 1 capsule (100 mg total) by mouth 2 (two) times daily. 90 capsule 2   hydrOXYzine  (ATARAX ) 10 MG tablet Take 1 tablet (10 mg total) by mouth 3 (three) times daily as  needed. 60 tablet 2   ipratropium-albuterol  (DUONEB) 0.5-2.5 (3) MG/3ML SOLN Take 3 mLs by nebulization every 6 (six) hours as needed. (Patient not taking: Reported on 04/30/2024) 360 mL 0   levocetirizine (XYZAL) 5 MG tablet Take 5 mg by mouth at bedtime as needed (for seasonal allergies- if not taking generic Claritin ).     lidocaine -prilocaine  (EMLA ) cream Apply 1 Application topically as needed. 30 g 2   LORazepam  (ATIVAN ) 1  MG tablet Take 1 tablet (1 mg total) by mouth as needed. 30 min Prior to MRI or brain radiation procedure 5 tablet 0   LORazepam  (ATIVAN ) 1 MG tablet Take 1 tablet (1 mg total) by mouth as needed for anxiety. Take 1 tablet (0.5 mg total) by mouth 20 minutes prior to radiation treatment. Do not drive after taking medication. 3 tablet 0   methocarbamol  (ROBAXIN ) 500 MG tablet Take 1 tablet (500 mg total) by mouth every 8 (eight) hours as needed for muscle spasms. 20 tablet 0   ondansetron  (ZOFRAN ) 8 MG tablet Take 1 tablet (8 mg total) by mouth every 8 (eight) hours as needed for nausea or vomiting. 30 tablet 2   oxyCODONE  (OXY IR/ROXICODONE ) 5 MG immediate release tablet Take 1 tablet (5 mg total) by mouth every 4 (four) hours as needed for severe pain. 12 tablet 0   pantoprazole  (PROTONIX ) 20 MG tablet Take 20 mg by mouth every evening.     predniSONE  (DELTASONE ) 10 MG tablet Take 2 tablets (20 mg total) by mouth daily. (Patient taking differently: Take 20 mg by mouth as needed.) 20 tablet 0   telmisartan  (MICARDIS ) 40 MG tablet Take 1 tablet (40 mg total) by mouth daily. 90 tablet 3   traMADol  (ULTRAM ) 50 MG tablet Take 2 tablets (100 mg total) by mouth every 6 (six) hours as needed for severe pain. (Patient taking differently: Take 50 mg by mouth every 6 (six) hours as needed for severe pain (pain score 7-10).) 30 tablet 0   Zinc  Acetate, Oral, (ZINC  ACETATE PO) Take 1 tablet by mouth daily. Received from herbalist     No current facility-administered medications for this encounter.    Physical Findings: Vitals:   07/27/24 1149  BP: 132/67  Pulse: (!) 105  Resp: 17  Temp: 98 F (36.7 C)  SpO2: 96%    General: Alert and oriented, in no acute distress HEENT: Head is normocephalic. Extraocular movements are intact. Neck: Neck is supple, no palpable cervical or supraclavicular lymphadenopathy. Heart: Regular in rate and rhythm with no murmurs, rubs, or gallops. Chest: Clear to auscultation  bilaterally, with no rhonchi, wheezes, or rales. Abdomen: Soft, nontender, nondistended, with no rigidity or guarding. Extremities: No cyanosis or edema. Lymphatics: see Neck Exam Skin: No concerning lesions. Musculoskeletal: symmetric strength and muscle tone throughout. Neurologic: Cranial nerves II through XII are grossly intact. No obvious focalities. Speech is fluent. Coordination is intact.  Psychiatric: Judgment and insight are intact. Affect is appropriate.   KPS = 60  100 - Normal; no complaints; no evidence of disease. 90   - Able to carry on normal activity; minor signs or symptoms of disease. 80   - Normal activity with effort; some signs or symptoms of disease. 50   - Cares for self; unable to carry on normal activity or to do active work. 60   - Requires occasional assistance, but is able to care for most of his personal needs. 50   - Requires considerable assistance and frequent medical care. 40   -  Disabled; requires special care and assistance. 30   - Severely disabled; hospital admission is indicated although death not imminent. 20   - Very sick; hospital admission necessary; active supportive treatment necessary. 10   - Moribund; fatal processes progressing rapidly. 0     - Dead  Karnofsky DA, Abelmann WH, Craver LS and Burchenal [facility] [phone]) The use of the nitrogen mustards in the palliative treatment of carcinoma: with particular reference to bronchogenic carcinoma Cancer 1 634-56  Lab Findings: Lab Results  Component Value Date   WBC 7.3 07/19/2024   HGB 12.7 07/19/2024   HCT 38.2 07/19/2024   MCV 91.0 07/19/2024   PLT 223 07/19/2024    Radiographic Findings: CT CHEST ABDOMEN PELVIS W CONTRAST Result Date: 07/22/2024 CLINICAL DATA:  Non-small-cell lung cancer restaging * Tracking Code: BO * EXAM: CT CHEST, ABDOMEN, AND PELVIS WITH CONTRAST TECHNIQUE: Multidetector CT imaging of the chest, abdomen and pelvis was performed following the standard protocol during  bolus administration of intravenous contrast. RADIATION DOSE REDUCTION: This exam was performed according to the departmental dose-optimization program which includes automated exposure control, adjustment of the mA and/or kV according to patient size and/or use of iterative reconstruction technique. CONTRAST:  OMNIPAQUE  IOHEXOL  300 MG/ML  SOLN COMPARISON:  04/16/2024 FINDINGS: CT CHEST FINDINGS Cardiovascular: Right chest port catheter. Aortic atherosclerosis. Normal heart size. Three-vessel coronary artery calcifications. No pericardial effusion. Mediastinum/Nodes: No enlarged mediastinal, hilar, or axillary lymph nodes. Left lobe thyroid  nodule of doubtful clinical significance in the setting of known primary lung malignancy. Trachea, and esophagus demonstrate no significant findings. Lungs/Pleura: Unchanged post treatment/post radiation appearance of the chest with dense perihilar and suprahilar fibrotic consolidation with substantial volume loss of the left upper lobe. Moderate centrilobular emphysema. Biopsy marking clip and faint irregular ground-glass opacity in the peripheral right upper lobe (series 4, image 56). Unchanged small, chronic, loculated appearing bilateral pleural effusions. Musculoskeletal: No chest wall abnormality. Interval progression of multiple thoracic wedge deformities, involving T3-T4 T6, and T7, particularly fracture of T7 is completely new when compared to prior examination. CT ABDOMEN PELVIS FINDINGS Hepatobiliary: No focal liver abnormality is seen. Status post cholecystectomy. No biliary dilatation. Pancreas: Unremarkable. No pancreatic ductal dilatation or surrounding inflammatory changes. Spleen: Normal in size without significant abnormality. Adrenals/Urinary Tract: Adrenal glands are unremarkable. Kidneys are normal, without renal calculi, solid lesion, or hydronephrosis. Bladder is unremarkable. Stomach/Bowel: Stomach is within normal limits. Appendix appears normal. No  evidence of bowel wall thickening, distention, or inflammatory changes. Sigmoid colon resection and reanastomosis. Vascular/Lymphatic: Severe aortic atherosclerosis. No enlarged abdominal or pelvic lymph nodes. Reproductive: Unchanged, expansile, hypodense appearance of the fundal uterus (series 6, image 96). Other: Unchanged right eccentric ventral hernia containing nonobstructed transverse colon (series 2, image 77). No ascites. Musculoskeletal: No acute osseous findings. IMPRESSION: 1. Unchanged post treatment/post radiation appearance of the left chest with dense perihilar and suprahilar fibrotic consolidation with substantial volume loss of the left upper lobe. 2. Biopsy marking clip and faint irregular ground-glass opacity in the peripheral right upper lobe, unchanged in appearance. 3. No evidence of lymphadenopathy or metastatic disease in the chest, abdomen, or pelvis. 4. Unchanged small, chronic, loculated appearing bilateral pleural effusions. 5. Interval progression of multiple thoracic wedge deformities, involving T3-T4 T6, and T7, particularly fracture of T7 is completely new when compared to prior examination. 6. Unchanged, expansile, hypodense appearance of the fundal uterus, possibly reflecting endometrial thickening or fluid. Consider pelvic ultrasound for further evaluation if and when clinically appropriate in the setting of  known primary lung malignancy. 7. Coronary artery disease. Aortic Atherosclerosis (ICD10-I70.0) and Emphysema (ICD10-J43.9). Electronically Signed   By: Marolyn JONETTA Jaksch M.D.   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 We will schedule her for a brain MRI in 2 months with an telephone visit to follow to review the results. Lorazepam  prescribed for  MRI-induced anxiety.   She will continue under active surveillance under the care of Dr. Gatha. Per his recommendations, she will see him in approximately 3 months after her next re-staging scans.    I personally spent 20 minutes in this encounter including chart review, reviewing radiological studies, meeting face-to-face with the patient, entering orders and completing documentation.  _____________________________________     Leeroy Due, PA-C

## 2024-07-27 NOTE — Progress Notes (Signed)
 Laurie  Mccoy is here today for follow up post radiation to the brain.    Does the patient complain of any of the following: Headache: No Visual Changes: No Hearing Changes: No Nausea: No Vomiting: No Balance or coordination issues: yes, balance issues.  Memory issues: good   Is the patient currently on a Decadron  regimen? :  Patient is currently on prednisone  20 mg daily.   Additional comments if applicable: BP 132/67 (BP Location: Left Arm, Patient Position: Sitting, Cuff Size: Large)   Pulse (!) 105   Temp 98 F (36.7 C)   Resp 17   Ht 5' 5 (1.651 m)   Wt 183 lb (83 kg)   SpO2 96%   BMI 30.45 kg/m

## 2024-07-27 NOTE — Progress Notes (Signed)
 [facility] [phone]   [phone]  OFFICE PROGRESS NOTE  [name], NP [phone] [address] [place] [address]  DIAGNOSIS: Stage IV (T2 a, N3, M1b) non-small cell lung cancer, adenocarcinoma presented with left upper lobe perihilar mass in addition to left hilar and mediastinal lymphadenopathy as well as left supraclavicular lymphadenopathy with brain metastasis diagnosed in September 2023.   [name]   Detected Alteration(s) / Biomarker(s)          Associated FDA-approved therapies  Clinical Trial Availability          % cfDNA or Amplification   KRAS G12C approved by FDA Adagrasib, Sotorasib Yes    1.7%   TP53 R249S None Yes          1.5%   PRIOR THERAPY:  1) SRS the the metastatic brain lesion under the care of Dr. Izell 2) Craniotomy on 07/10/22 under the care of Dr. Carollee 3) radiation to the chest under care of Dr. Shannon.  Last dose on 08/23/2022 4) Systemic chemotherapy with carboplatin  for an AUC of 5,  Alimta  500 mg/m2, and Keytruda  200 mg IV every 3 weeks.  First dose expected on October 08, 2022. Starting from cycle #5 she will be on maintenance treatment with Alimta  and Keytruda  every 3 weeks. Status post 17 cycles. Will reduce her dose of alimta  to 400 mg/m2 secondary to intolerance. Last dose of treatment was given on November 03, 2023 discontinued secondary to intolerance.  CURRENT THERAPY: Observation.  INTERVAL HISTORY: Laurie  Mccoy 83 y.o. female returns to the clinic today for follow-up visit accompanied by her husband.Discussed the use of AI scribe software for clinical note transcription with the patient, who gave verbal consent to proceed.  History of Present Illness Laurie  Mccoy is an 83 year old female with non-small cell lung cancer who presents for evaluation with repeat CT scan for restaging of her disease.  She has been on a treatment regimen of Alimta  and Keytruda  every three weeks, which was  discontinued in January 2025 due to intolerance. She is currently in an observation period.  She feels generally well with no significant pain, although she experiences intermittent back pain due to compression fractures, which she attributes to osteoporosis. The back pain is described as 'comes and goes' but is currently absent.  She experiences shortness of breath and uses an inhaler. She reports that she sometimes uses oxygen, but not all the time, and sometimes does not use it at all during the day.  She has undergone a CT scan of the chest, abdomen, and pelvis for restaging of her disease.  No current pain. She reports shortness of breath and intermittent back pain.    MEDICAL HISTORY: Past Medical History:  Diagnosis Date   Anxiety    had panic attacks years ago   Asthma    COPD (chronic obstructive pulmonary disease) (HCC)    Goiter 2022   History of hiatal hernia    dx in college, never bothered me   History of kidney stones 2012   History of radiation therapy    Left Lung-07/10/22-08/23/22- Dr. Lynwood Shannon   Hypertension    Lung cancer [facility])    Sleep apnea     ALLERGIES:  is allergic to compazine  [prochlorperazine ], norvasc  [amlodipine ], and trelegy ellipta  [fluticasone -umeclidin-vilant].  MEDICATIONS:  Current Outpatient Medications  Medication Sig Dispense Refill   albuterol  (VENTOLIN  HFA) 108 (90 Base) MCG/ACT inhaler Inhale 2 puffs into the  lungs every 6 (six) hours as needed for wheezing or shortness of breath. 18 g 0   ALPRAZolam  (XANAX ) 0.25 MG tablet Take 0.25 mg by mouth at bedtime as needed for anxiety.     Budeson-Glycopyrrol-Formoterol  (BREZTRI  AEROSPHERE) 160-9-4.8 MCG/ACT AERO INHALE 2 PUFFS BY MOUTH EVERY MORNING AND EVERY NIGHT AT BEDTIME (Patient taking differently: Inhale 2 puffs into the lungs in the morning and at bedtime.) 10.7 g 5   ELIQUIS  5 MG TABS tablet Take 1 tablet (5 mg total) by mouth 2 (two) times daily. 60 tablet 3   folic acid   (FOLVITE ) 1 MG tablet TAKE ONE TABLET BY MOUTH EVERY DAY 30 tablet 2   furosemide  (LASIX ) 20 MG tablet 1 tablet p.o. daily as needed for increased leg swelling. 20 tablet 0   gabapentin  (NEURONTIN ) 100 MG capsule Take 1 capsule (100 mg total) by mouth 2 (two) times daily. 90 capsule 2   hydrOXYzine  (ATARAX ) 10 MG tablet Take 1 tablet (10 mg total) by mouth 3 (three) times daily as needed. 60 tablet 2   ipratropium-albuterol  (DUONEB) 0.5-2.5 (3) MG/3ML SOLN Take 3 mLs by nebulization every 6 (six) hours as needed. (Patient not taking: Reported on 04/30/2024) 360 mL 0   levocetirizine (XYZAL) 5 MG tablet Take 5 mg by mouth at bedtime as needed (for seasonal allergies- if not taking generic Claritin ).     lidocaine -prilocaine  (EMLA ) cream Apply 1 Application topically as needed. 30 g 2   LORazepam  (ATIVAN ) 1 MG tablet Take 1 tablet (1 mg total) by mouth as needed. 30 min Prior to MRI or brain radiation procedure 5 tablet 0   LORazepam  (ATIVAN ) 1 MG tablet Take 1 tablet (1 mg total) by mouth as needed for anxiety. Take 1 tablet (0.5 mg total) by mouth 20 minutes prior to radiation treatment. Do not drive after taking medication. 3 tablet 0   methocarbamol  (ROBAXIN ) 500 MG tablet Take 1 tablet (500 mg total) by mouth every 8 (eight) hours as needed for muscle spasms. 20 tablet 0   ondansetron  (ZOFRAN ) 8 MG tablet Take 1 tablet (8 mg total) by mouth every 8 (eight) hours as needed for nausea or vomiting. 30 tablet 2   oxyCODONE  (OXY IR/ROXICODONE ) 5 MG immediate release tablet Take 1 tablet (5 mg total) by mouth every 4 (four) hours as needed for severe pain. 12 tablet 0   pantoprazole  (PROTONIX ) 20 MG tablet Take 20 mg by mouth every evening.     predniSONE  (DELTASONE ) 10 MG tablet Take 2 tablets (20 mg total) by mouth daily. (Patient taking differently: Take 20 mg by mouth as needed.) 20 tablet 0   telmisartan  (MICARDIS ) 40 MG tablet Take 1 tablet (40 mg total) by mouth daily. 90 tablet 3   traMADol   (ULTRAM ) 50 MG tablet Take 2 tablets (100 mg total) by mouth every 6 (six) hours as needed for severe pain. (Patient taking differently: Take 50 mg by mouth every 6 (six) hours as needed for severe pain (pain score 7-10).) 30 tablet 0   Zinc  Acetate, Oral, (ZINC  ACETATE PO) Take 1 tablet by mouth daily. Received from herbalist     No current facility-administered medications for this visit.    SURGICAL HISTORY:  Past Surgical History:  Procedure Laterality Date   APPENDECTOMY     APPLICATION OF CRANIAL NAVIGATION N/A 07/11/2022   Procedure: APPLICATION OF CRANIAL NAVIGATION;  Surgeon: Dawley, Lani BROCKS, DO;  Location: [facility];  Service: Neurosurgery;  Laterality: N/A;   BRONCHIAL BIOPSY  06/12/2021  Procedure: BRONCHIAL BIOPSIES;  Surgeon: Brenna Adine CROME, DO;  [facility]: [facility];  Service: Pulmonary;;   BRONCHIAL BRUSHINGS  06/12/2021   Procedure: BRONCHIAL BRUSHINGS;  Surgeon: Brenna Adine CROME, DO;  [facility]: [facility];  Service: Pulmonary;;   BRONCHIAL WASHINGS  06/12/2021   Procedure: BRONCHIAL WASHINGS;  Surgeon: Brenna Adine CROME, DO;  [facility]: [facility];  Service: Pulmonary;;   CHOLECYSTECTOMY     COLON RESECTION     CRANIOTOMY N/A 07/11/2022   Procedure: Craniotomy for resection of tumor;  Surgeon: Carollee Lani BROCKS, DO;  Location: [facility];  Service: Neurosurgery;  Laterality: N/A;  RM 19   FIDUCIAL MARKER PLACEMENT  06/12/2021   Procedure: FIDUCIAL MARKER PLACEMENT;  Surgeon: Brenna Adine CROME, DO;  [facility];  Service: Pulmonary;;   hernia     x 2   IR IMAGING GUIDED PORT INSERTION  10/11/2022   TONSILLECTOMY     VIDEO BRONCHOSCOPY WITH ENDOBRONCHIAL NAVIGATION Bilateral 06/12/2021   Procedure: VIDEO BRONCHOSCOPY WITH ENDOBRONCHIAL NAVIGATION;  Surgeon: Brenna Adine CROME, DO;  [facility];  Service: Pulmonary;  Laterality: Bilateral;  ION   VIDEO BRONCHOSCOPY WITH RADIAL ENDOBRONCHIAL ULTRASOUND  06/12/2021   Procedure: RADIAL ENDOBRONCHIAL ULTRASOUND;   Surgeon: Brenna Adine CROME, DO;  [facility]: [facility];  Service: Pulmonary;;    REVIEW OF SYSTEMS:  Constitutional: positive for fatigue Eyes: negative Ears, nose, mouth, throat, and face: negative Respiratory: positive for cough and dyspnea on exertion Cardiovascular: negative Gastrointestinal: negative Genitourinary:negative Integument/breast: negative Hematologic/lymphatic: negative Musculoskeletal:positive for arthralgias, back pain, and muscle weakness Neurological: negative Behavioral/Psych: negative Endocrine: negative Allergic/Immunologic: negative   PHYSICAL EXAMINATION: General appearance: alert, cooperative, fatigued, and no distress Head: Normocephalic, without obvious abnormality, atraumatic Neck: no adenopathy, no JVD, supple, symmetrical, trachea midline, and thyroid  not enlarged, symmetric, no tenderness/mass/nodules Lymph nodes: Cervical, supraclavicular, and axillary nodes normal. Resp: rhonchi bilaterally Back: symmetric, no curvature. ROM normal. No CVA tenderness. Cardio: regular rate and rhythm, S1, S2 normal, no murmur, click, rub or gallop GI: soft, non-tender; bowel sounds normal; no masses,  no organomegaly Extremities: extremities normal, atraumatic, no cyanosis or edema Neurologic: Alert and oriented X 3, normal strength and tone. Normal symmetric reflexes. Normal coordination and gait  ECOG PERFORMANCE STATUS: 1 - Symptomatic but completely ambulatory  Blood pressure 132/67, pulse (!) 105, temperature 98 F (36.7 C), resp. rate 17, height 5' 5 (1.651 m), weight 183 lb (83 kg), SpO2 96%.  LABORATORY DATA: Lab Results  Component Value Date   WBC 7.3 07/19/2024   HGB 12.7 07/19/2024   HCT 38.2 07/19/2024   MCV 91.0 07/19/2024   PLT 223 07/19/2024      Chemistry      Component Value Date/Time   NA 136 07/19/2024 1440   NA 139 09/17/2019 1550   K 4.2 07/19/2024 1440   CL 102 07/19/2024 1440   CO2 29 07/19/2024 1440   BUN 20 07/19/2024 1440    BUN 9 09/17/2019 1550   CREATININE 0.46 07/19/2024 1440      Component Value Date/Time   CALCIUM  9.1 07/19/2024 1440   ALKPHOS 76 07/19/2024 1440   AST <10 (L) 07/19/2024 1440   ALT 10 07/19/2024 1440   BILITOT 0.7 07/19/2024 1440       RADIOGRAPHIC STUDIES: CT CHEST ABDOMEN PELVIS W CONTRAST Result Date: 07/22/2024 CLINICAL DATA:  Non-small-cell lung cancer restaging * Tracking Code: BO * EXAM: CT CHEST, ABDOMEN, AND PELVIS WITH CONTRAST TECHNIQUE: Multidetector CT imaging of the chest, abdomen and pelvis was performed  following the standard protocol during bolus administration of intravenous contrast. RADIATION DOSE REDUCTION: This exam was performed according to the departmental dose-optimization program which includes automated exposure control, adjustment of the mA and/or kV according to patient size and/or use of iterative reconstruction technique. CONTRAST:  OMNIPAQUE  IOHEXOL  300 MG/ML  SOLN COMPARISON:  04/16/2024 FINDINGS: CT CHEST FINDINGS Cardiovascular: Right chest port catheter. Aortic atherosclerosis. Normal heart size. Three-vessel coronary artery calcifications. No pericardial effusion. Mediastinum/Nodes: No enlarged mediastinal, hilar, or axillary lymph nodes. Left lobe thyroid  nodule of doubtful clinical significance in the setting of known primary lung malignancy. Trachea, and esophagus demonstrate no significant findings. Lungs/Pleura: Unchanged post treatment/post radiation appearance of the chest with dense perihilar and suprahilar fibrotic consolidation with substantial volume loss of the left upper lobe. Moderate centrilobular emphysema. Biopsy marking clip and faint irregular ground-glass opacity in the peripheral right upper lobe (series 4, image 56). Unchanged small, chronic, loculated appearing bilateral pleural effusions. Musculoskeletal: No chest wall abnormality. Interval progression of multiple thoracic wedge deformities, involving T3-T4 T6, and T7,  particularly fracture of T7 is completely new when compared to prior examination. CT ABDOMEN PELVIS FINDINGS Hepatobiliary: No focal liver abnormality is seen. Status post cholecystectomy. No biliary dilatation. Pancreas: Unremarkable. No pancreatic ductal dilatation or surrounding inflammatory changes. Spleen: Normal in size without significant abnormality. Adrenals/Urinary Tract: Adrenal glands are unremarkable. Kidneys are normal, without renal calculi, solid lesion, or hydronephrosis. Bladder is unremarkable. Stomach/Bowel: Stomach is within normal limits. Appendix appears normal. No evidence of bowel wall thickening, distention, or inflammatory changes. Sigmoid colon resection and reanastomosis. Vascular/Lymphatic: Severe aortic atherosclerosis. No enlarged abdominal or pelvic lymph nodes. Reproductive: Unchanged, expansile, hypodense appearance of the fundal uterus (series 6, image 96). Other: Unchanged right eccentric ventral hernia containing nonobstructed transverse colon (series 2, image 77). No ascites. Musculoskeletal: No acute osseous findings. IMPRESSION: 1. Unchanged post treatment/post radiation appearance of the left chest with dense perihilar and suprahilar fibrotic consolidation with substantial volume loss of the left upper lobe. 2. Biopsy marking clip and faint irregular ground-glass opacity in the peripheral right upper lobe, unchanged in appearance. 3. No evidence of lymphadenopathy or metastatic disease in the chest, abdomen, or pelvis. 4. Unchanged small, chronic, loculated appearing bilateral pleural effusions. 5. Interval progression of multiple thoracic wedge deformities, involving T3-T4 T6, and T7, particularly fracture of T7 is completely new when compared to prior examination. 6. Unchanged, expansile, hypodense appearance of the fundal uterus, possibly reflecting endometrial thickening or fluid. Consider pelvic ultrasound for further evaluation if and when clinically appropriate in the  setting of known primary lung malignancy. 7. Coronary artery disease. Aortic Atherosclerosis (ICD10-I70.0) and Emphysema (ICD10-J43.9). Electronically Signed   By: Marolyn JONETTA Jaksch M.D.   On: 07/22/2024 07:11    ASSESSMENT AND PLAN: This is a 83 years old white female with Stage IV (T2 a, N3, M1b) non-small cell lung cancer, adenocarcinoma presented with left upper lobe perihilar mass in addition to left hilar and mediastinal lymphadenopathy as well as left supraclavicular lymphadenopathy with brain metastasis diagnosed in September 2023.   . The patient is status post SRS to brain metastasis followed by craniotomy followed by palliative radiotherapy to the left lung mass.  She was then started on systemic chemotherapy initially with carboplatin , Alimta  and Keytruda  for 4 cycles followed by maintenance treatment with Alimta  and Keytruda  starting from cycle #5 for a total of 17 cycles.  She has been off treatment since late January 2025 secondary to intolerance. She had repeat PET scan performed recently  that showed no concerning findings for disease progression and significant improvement of her disease compared to the initial studies. The patient is currently on observation and she is feeling fine except for the baseline fatigue. She had repeat CT scan of the chest, abdomen pelvis performed recently.  I personally independently reviewed the scan and discussed the result with the patient and her husband.  Her scan showed no concerning findings for disease progression but she has interval progression of multiple thoracic wedge deformities. Assessment and Plan Assessment & Plan Malignant neoplasm of lung, currently under observation Currently under observation following discontinuation of Alimta  and Keytruda  due to intolerance. Recent CT scan of the chest, abdomen, and pelvis shows no evidence of disease progression. No new treatment is necessary unless concerning findings arise. - Continue observation  without new treatment - Will schedule follow-up CT scan in three months  Compression fracture of vertebra Compression fracture likely secondary to osteoporosis. No acute intervention required at this time. - Continue monitoring for any changes or symptoms  Dyspnea Intermittent dyspnea, likely related to underlying lung condition. Uses an inhaler and oxygen as needed, though not consistently. No acute intervention required. - Continue current management with inhaler and oxygen as needed She was advised to call immediately if she has any other concerning symptoms in the interval.  The patient voices understanding of current disease status and treatment options and is in agreement with the current care plan.  All questions were answered. The patient knows to call the clinic with any problems, questions or concerns. We can certainly see the patient much sooner if necessary.  The total time spent in the appointment was 30 minutes.  Disclaimer: This note was dictated with voice recognition software. Similar sounding words can inadvertently be transcribed and may not be corrected upon review.

## 2024-08-03 ENCOUNTER — Ambulatory Visit: Admitting: Radiology

## 2024-09-04 ENCOUNTER — Emergency Department (HOSPITAL_COMMUNITY)

## 2024-09-04 ENCOUNTER — Encounter (HOSPITAL_COMMUNITY): Payer: Self-pay

## 2024-09-04 ENCOUNTER — Other Ambulatory Visit: Payer: Self-pay

## 2024-09-04 ENCOUNTER — Emergency Department (HOSPITAL_COMMUNITY): Admission: EM | Admit: 2024-09-04 | Discharge: 2024-09-04 | Disposition: A | Discharge status: Home / Self Care

## 2024-09-04 DIAGNOSIS — Z8709 Personal history of other diseases of the respiratory system: Secondary | ICD-10-CM

## 2024-09-04 DIAGNOSIS — Z7901 Long term (current) use of anticoagulants: Secondary | ICD-10-CM | POA: Insufficient documentation

## 2024-09-04 DIAGNOSIS — M25561 Pain in right knee: Secondary | ICD-10-CM | POA: Insufficient documentation

## 2024-09-04 DIAGNOSIS — S51011A Laceration without foreign body of right elbow, initial encounter: Secondary | ICD-10-CM | POA: Insufficient documentation

## 2024-09-04 DIAGNOSIS — W19XXXA Unspecified fall, initial encounter: Secondary | ICD-10-CM

## 2024-09-04 DIAGNOSIS — W010XXA Fall on same level from slipping, tripping and stumbling without subsequent striking against object, initial encounter: Secondary | ICD-10-CM | POA: Insufficient documentation

## 2024-09-04 DIAGNOSIS — J449 Chronic obstructive pulmonary disease, unspecified: Secondary | ICD-10-CM | POA: Insufficient documentation

## 2024-09-04 DIAGNOSIS — M549 Dorsalgia, unspecified: Secondary | ICD-10-CM | POA: Insufficient documentation

## 2024-09-04 LAB — CBC WITH DIFFERENTIAL/PLATELET
Abs Immature Granulocytes: 0.07 K/uL (ref 0.00–0.07)
Basophils Absolute: 0.1 K/uL (ref 0.0–0.1)
Basophils Relative: 1 %
Eosinophils Absolute: 0.1 K/uL (ref 0.0–0.5)
Eosinophils Relative: 1 %
HCT: 40.7 % (ref 36.0–46.0)
Hemoglobin: 12.5 g/dL (ref 12.0–15.0)
Immature Granulocytes: 1 %
Lymphocytes Relative: 19 %
Lymphs Abs: 1.4 K/uL (ref 0.7–4.0)
MCH: 29.2 pg (ref 26.0–34.0)
MCHC: 30.7 g/dL (ref 30.0–36.0)
MCV: 95.1 fL (ref 80.0–100.0)
Monocytes Absolute: 0.5 K/uL (ref 0.1–1.0)
Monocytes Relative: 6 %
Neutro Abs: 5.5 K/uL (ref 1.7–7.7)
Neutrophils Relative %: 72 %
Platelets: 250 K/uL (ref 150–400)
RBC: 4.28 MIL/uL (ref 3.87–5.11)
RDW: 13.5 % (ref 11.5–15.5)
WBC: 7.6 K/uL (ref 4.0–10.5)
nRBC: 0 % (ref 0.0–0.2)

## 2024-09-04 LAB — BASIC METABOLIC PANEL WITH GFR
Anion gap: 12 (ref 5–15)
BUN: 10 mg/dL (ref 8–23)
CO2: 28 mmol/L (ref 22–32)
Calcium: 8.5 mg/dL — ABNORMAL LOW (ref 8.9–10.3)
Chloride: 100 mmol/L (ref 98–111)
Creatinine, Ser: 0.56 mg/dL (ref 0.44–1.00)
GFR, Estimated: >60 mL/min (ref >60)
Glucose, Bld: 143 mg/dL — ABNORMAL HIGH (ref 70–99)
Potassium: 4.1 mmol/L (ref 3.5–5.1)
Sodium: 140 mmol/L (ref 135–145)

## 2024-09-04 MED ORDER — FENTANYL CITRATE (PF) 50 MCG/ML IJ SOSY
50.0000 ug | PREFILLED_SYRINGE | Freq: Once | INTRAMUSCULAR | Status: AC
  Administered 2024-09-04: 50 ug via INTRAVENOUS
  Filled 2024-09-04: qty 1

## 2024-09-04 MED ORDER — LACTATED RINGERS IV BOLUS
500.0000 mL | Freq: Once | INTRAVENOUS | Status: AC
  Administered 2024-09-04: 500 mL via INTRAVENOUS

## 2024-09-04 MED ORDER — HYDROXYZINE HCL 10 MG PO TABS
10.0000 mg | ORAL_TABLET | Freq: Once | ORAL | Status: AC
  Administered 2024-09-04: 10 mg via ORAL
  Filled 2024-09-04: qty 1

## 2024-09-04 MED ORDER — LORAZEPAM 1 MG PO TABS
1.0000 mg | ORAL_TABLET | Freq: Once | ORAL | Status: AC
  Administered 2024-09-04: 1 mg via ORAL
  Filled 2024-09-04: qty 1

## 2024-09-04 MED ORDER — HYDROMORPHONE HCL 1 MG/ML IJ SOLN
0.5000 mg | Freq: Once | INTRAMUSCULAR | Status: AC
  Administered 2024-09-04: 0.5 mg via INTRAVENOUS
  Filled 2024-09-04: qty 1

## 2024-09-04 NOTE — ED Notes (Signed)
 Trauma Response Nurse Documentation   Laurie Mccoy is a 83 y.o. female arriving to [facility] ED via EMS  On Eliquis  (apixaban ) daily. Trauma was activated as a Level 2 by ED Charge RN based on the following trauma criteria Elderly patients > 65 with head trauma on anti-coagulation (excluding ASA).  Patient cleared for CT by Dr. Loreda. Pt transported to CT with trauma response nurse present to monitor. RN remained with the patient throughout their absence from the department for clinical observation.   GCS 15.  History   Past Medical History:  Diagnosis Date   Anxiety    had panic attacks years ago   Asthma    COPD (chronic obstructive pulmonary disease) (HCC)    Goiter 2022   History of hiatal hernia    dx in college, never bothered me   History of kidney stones 2012   History of radiation therapy    Left Lung-07/10/22-08/23/22- Dr. Lynwood Nasuti   Hypertension    Lung cancer [facility])    Sleep apnea      Past Surgical History:  Procedure Laterality Date   APPENDECTOMY     APPLICATION OF CRANIAL NAVIGATION N/A 07/11/2022   Procedure: APPLICATION OF CRANIAL NAVIGATION;  Surgeon: Carollee Lani BROCKS, DO;  Location: [facility];  Service: Neurosurgery;  Laterality: N/A;   BRONCHIAL BIOPSY  06/12/2021   Procedure: BRONCHIAL BIOPSIES;  Surgeon: Brenna Adine CROME, DO;  [facility];  Service: Pulmonary;;   BRONCHIAL BRUSHINGS  06/12/2021   Procedure: BRONCHIAL BRUSHINGS;  Surgeon: Brenna Adine CROME, DO;  [facility];  Service: Pulmonary;;   BRONCHIAL WASHINGS  06/12/2021   Procedure: BRONCHIAL WASHINGS;  Surgeon: Brenna Adine CROME, DO;  [facility];  Service: Pulmonary;;   CHOLECYSTECTOMY     COLON RESECTION     CRANIOTOMY N/A 07/11/2022   Procedure: Craniotomy for resection of tumor;  Surgeon: Carollee Lani BROCKS, DO;  Location: [facility];  Service: Neurosurgery;  Laterality: N/A;  RM 19   FIDUCIAL MARKER PLACEMENT  06/12/2021   Procedure: FIDUCIAL MARKER  PLACEMENT;  Surgeon: Brenna Adine CROME, DO;  [facility];  Service: Pulmonary;;   hernia     x 2   IR IMAGING GUIDED PORT INSERTION  10/11/2022   TONSILLECTOMY     VIDEO BRONCHOSCOPY WITH ENDOBRONCHIAL NAVIGATION Bilateral 06/12/2021   Procedure: VIDEO BRONCHOSCOPY WITH ENDOBRONCHIAL NAVIGATION;  Surgeon: Brenna Adine CROME, DO;  [facility];  Service: Pulmonary;  Laterality: Bilateral;  ION   VIDEO BRONCHOSCOPY WITH RADIAL ENDOBRONCHIAL ULTRASOUND  06/12/2021   Procedure: RADIAL ENDOBRONCHIAL ULTRASOUND;  Surgeon: Brenna Adine CROME, DO;  [facility]: [facility];  Service: Pulmonary;;     Initial Focused Assessment (If applicable, or please see trauma documentation): Airway: Intact, patent Breathing: Breath sounds - crackles auscultated bilaterally, wet cough present. On 4L O2 (baseline), SpO2 95%. No SOB or CP. Circulation: Small hematoma noted to top right side of head, Bruising and skin tear noted to R elbow, bruising and swelling to R knee. Pulses intact throughout. No obvious external hemorrhage noted. 18G PIV to L AC Disability: PERRLA, MAE equally with equal sensation throughout. C/O lower back pain. No neck pain present.   CT's Completed:   CT Head and CT C-Spine   Interventions:  CXR Pelvic XR Basic labs drawn CT head and c-spine  Undressed and assessed thoroughly  Spoke with pt and her husband regarding POC.   Plan for disposition:  Discharge home   Consults completed:  none at 1100.  Event Summary: Pt was BIB [facility] after falling while in the bathroom.  Pt was getting out of the shower when she fell, striking her head on the shower door and landed on her right side.  No LOC.  Pt's husband, Odis, was unable to get her off of the floor by himself.  FD and EMS got pt safely off the floor and brought her into the ED. Pt is on Eliquis  for A-fib and has a hx of lung CA with mets; on 4L home O2. Pt has a port to her R chest but states that she has not had to  receive chemo lately and that her last PET scan looked good according to her oncologist.   Bedside handoff with ED RN Alaina.    LEBRON ROCKIE ORN  Trauma Response RN  Please call TRN at [phone] for further assistance.

## 2024-09-04 NOTE — ED Provider Notes (Signed)
 [place] EMERGENCY DEPARTMENT AT [facility] Provider Note   CSN: [id number] Arrival date & time: 09/04/24  9052     Patient presents with: Level 2- FOT   Laurie Mccoy is a 83 y.o. female.   83 year old female presents for evaluation of a fall.  She did hit her head on the shower door following.  She is on Eliquis .  She states she did not lose consciousness.  States she lost balance and just tripped and fell.  Complaining of right knee and elbow pain as well.  Per EMS patient was able to ambulate without much difficulty on their arrival.  Patient also complaining of some mild neck pain.  She denies any other symptoms or concerns.        Prior to Admission medications   Medication Sig Start Date End Date Taking? Authorizing Provider  albuterol  (VENTOLIN  HFA) 108 (90 Base) MCG/ACT inhaler Inhale 2 puffs into the lungs every 6 (six) hours as needed for wheezing or shortness of breath. 06/29/23   Federico Norleen ONEIDA MADISON, MD  ALPRAZolam  (XANAX ) 0.25 MG tablet Take 0.25 mg by mouth at bedtime as needed for anxiety.    [provider]  Budeson-Glycopyrrol-Formoterol  (BREZTRI  AEROSPHERE) 160-9-4.8 MCG/ACT AERO INHALE 2 PUFFS BY MOUTH EVERY MORNING AND EVERY NIGHT AT BEDTIME Patient taking differently: Inhale 2 puffs into the lungs in the morning and at bedtime. 05/09/22   Darlean Ozell NOVAK, MD  ELIQUIS  5 MG TABS tablet Take 1 tablet (5 mg total) by mouth 2 (two) times daily. 02/06/24   Heilingoetter, Cassandra L, PA-C  folic acid  (FOLVITE ) 1 MG tablet TAKE ONE TABLET BY MOUTH EVERY DAY 12/08/23   Heilingoetter, Cassandra L, PA-C  furosemide  (LASIX ) 20 MG tablet 1 tablet p.o. daily as needed for increased leg swelling. 02/05/23   Sherrod Sherrod, MD  gabapentin  (NEURONTIN ) 100 MG capsule Take 1 capsule (100 mg total) by mouth 2 (two) times daily. 11/05/22   Heilingoetter, Cassandra L, PA-C  hydrOXYzine  (ATARAX ) 10 MG tablet Take 1 tablet (10 mg total) by mouth 3 (three) times daily as  needed. 03/25/23   Heilingoetter, Cassandra L, PA-C  ipratropium-albuterol  (DUONEB) 0.5-2.5 (3) MG/3ML SOLN Take 3 mLs by nebulization every 6 (six) hours as needed. Patient not taking: Reported on 04/30/2024 08/27/23 03/03/24  Keith, Kayla N, PA-C  levocetirizine (XYZAL) 5 MG tablet Take 5 mg by mouth at bedtime as needed (for seasonal allergies- if not taking generic Claritin ).    [provider]  lidocaine -prilocaine  (EMLA ) cream Apply 1 Application topically as needed. 03/25/23   Heilingoetter, Cassandra L, PA-C  LORazepam  (ATIVAN ) 1 MG tablet Take 1 tablet (1 mg total) by mouth as needed. 30 min Prior to MRI or brain radiation procedure 04/25/23   Izell Domino, MD  LORazepam  (ATIVAN ) 1 MG tablet Take 1 tablet (1 mg total) by mouth as needed for anxiety. Take 1 tablet (0.5 mg total) by mouth 20 minutes prior to radiation treatment. Do not drive after taking medication. 07/27/24   Wyatt Leeroy HERO, PA-C  methocarbamol  (ROBAXIN ) 500 MG tablet Take 1 tablet (500 mg total) by mouth every 8 (eight) hours as needed for muscle spasms. 11/05/23   Melvenia Motto, MD  ondansetron  (ZOFRAN ) 8 MG tablet Take 1 tablet (8 mg total) by mouth every 8 (eight) hours as needed for nausea or vomiting. 07/22/22   Heilingoetter, Cassandra L, PA-C  oxyCODONE  (OXY IR/ROXICODONE ) 5 MG immediate release tablet Take 1 tablet (5 mg total) by mouth every 4 (four)  hours as needed for severe pain. 04/09/23   Beverley Leita LABOR, PA-C  pantoprazole  (PROTONIX ) 20 MG tablet Take 20 mg by mouth every evening. 07/19/22   [provider]  predniSONE  (DELTASONE ) 10 MG tablet Take 2 tablets (20 mg total) by mouth daily. Patient taking differently: Take 20 mg by mouth as needed. 08/27/23   Keith, Kayla N, PA-C  telmisartan  (MICARDIS ) 40 MG tablet Take 1 tablet (40 mg total) by mouth daily. 09/25/23   Meng, Hao, PA  traMADol  (ULTRAM ) 50 MG tablet Take 2 tablets (100 mg total) by mouth every 6 (six) hours as needed for severe  pain. Patient taking differently: Take 50 mg by mouth every 6 (six) hours as needed for severe pain (pain score 7-10). 07/13/22   Dawley, Troy C, DO  Zinc  Acetate, Oral, (ZINC  ACETATE PO) Take 1 tablet by mouth daily. Received from herbalist    [provider]    Allergies: Compazine  [prochlorperazine ], Norvasc  [amlodipine ], and Trelegy ellipta  [fluticasone -umeclidin-vilant]    Review of Systems  Constitutional:  Negative for chills and fever.  HENT:  Negative for ear pain and sore throat.   Eyes:  Negative for pain and visual disturbance.  Respiratory:  Negative for cough and shortness of breath.   Cardiovascular:  Negative for chest pain and palpitations.  Gastrointestinal:  Negative for abdominal pain and vomiting.  Genitourinary:  Negative for dysuria and hematuria.  Musculoskeletal:  Positive for neck pain. Negative for arthralgias and back pain.       Admits right elbow and knee pain  Skin:  Negative for color change and rash.  Neurological:  Negative for seizures and syncope.  All other systems reviewed and are negative.   Updated Vital Signs BP (!) 126/57 (BP Location: Left Arm)   Pulse (!) 102   Temp 97.7 F (36.5 C) (Oral)   Resp 18   Ht 5' 5 (1.651 m)   Wt 83 kg   SpO2 98%   BMI 30.45 kg/m   Physical Exam Vitals and nursing note reviewed.  Constitutional:      General: She is not in acute distress.    Appearance: She is well-developed.  HENT:     Head: Normocephalic and atraumatic.  Eyes:     Conjunctiva/sclera: Conjunctivae normal.  Cardiovascular:     Rate and Rhythm: Normal rate and regular rhythm.     Heart sounds: No murmur heard. Pulmonary:     Effort: Pulmonary effort is normal. No respiratory distress.     Breath sounds: Normal breath sounds.  Abdominal:     Palpations: Abdomen is soft.     Tenderness: There is no abdominal tenderness.  Musculoskeletal:        General: No swelling.     Cervical back: Neck supple. No tenderness.      Comments: Right knee with some mild tenderness to palpation on the lateral side, there is a skin tear to the right elbow, full range of motion of the elbow  Skin:    General: Skin is warm and dry.     Capillary Refill: Capillary refill takes less than 2 seconds.  Neurological:     Mental Status: She is alert.  Psychiatric:        Mood and Affect: Mood normal.     (all labs ordered are listed, but only abnormal results are displayed) Labs Reviewed  BASIC METABOLIC PANEL WITH GFR - Abnormal; Notable for the following components:      Result Value   Glucose,  Bld 143 (*)    Calcium  8.5 (*)    All other components within normal limits  CBC WITH DIFFERENTIAL/PLATELET    EKG: EKG Interpretation Date/Time:  Saturday September 04 2024 10:51:30 EST Ventricular Rate:  109 PR Interval:  137 QRS Duration:  101 QT Interval:  333 QTC Calculation: 449 R Axis:   -48  Text Interpretation: Sinus tachycardia Atrial premature complexes Left anterior fascicular block  Nonspecific ST changes in the inferior and lateral lead Comapred with prior EKG from 03/03/2024 Confirmed by Gennaro Bouchard ([id number]) on 09/04/2024 11:00:32 AM  Radiology: CT Lumbar Spine Wo Contrast Result Date: 09/04/2024 EXAM: CT OF THE LUMBAR SPINE WITHOUT CONTRAST 09/04/2024 10:55:05 AM TECHNIQUE: CT of the lumbar spine was performed without the administration of intravenous contrast. Multiplanar reformatted images are provided for review. Automated exposure control, iterative reconstruction, and/or weight based adjustment of the mA/kV was utilized to reduce the radiation dose to as low as reasonably achievable. COMPARISON: None available. CLINICAL HISTORY: fall, back pain FINDINGS: BONES AND ALIGNMENT: Mild levoscoliosis of the thoracolumbar spine. Mild downward bowing of the superior endplate of L1, which appears to be chronic. No apparent acute fracture. No suspicious bone lesion. DEGENERATIVE CHANGES: Moderate chronic facet  arthrosis at L4-L5 and L5-S1. SOFT TISSUES: Moderate calcific atheromatous disease within the abdominal aorta. Mild dependent atelectasis within the lower lobes. Small bilateral pleural effusions. IMPRESSION: 1. No acute lumbar spine fracture or malalignment identified. 2. Mild levoscoliosis of the thoracolumbar spine. 3. Moderate chronic facet arthrosis at L4-5 and L5-S1. 4. Small bilateral pleural effusions with mild dependent atelectasis in the lower lobes. Electronically signed by: Evalene Coho MD 09/04/2024 11:39 AM EST RP Workstation: [id number]   CT Head Wo Contrast Result Date: 09/04/2024 CLINICAL DATA:  Fall. EXAM: CT HEAD WITHOUT CONTRAST CT CERVICAL SPINE WITHOUT CONTRAST TECHNIQUE: Multidetector CT imaging of the head and cervical spine was performed following the standard protocol without intravenous contrast. Multiplanar CT image reconstructions of the cervical spine were also generated. RADIATION DOSE REDUCTION: This exam was performed according to the departmental dose-optimization program which includes automated exposure control, adjustment of the mA and/or kV according to patient size and/or use of iterative reconstruction technique. COMPARISON:  Brain MRI, 06/09/2024. FINDINGS: CT HEAD FINDINGS Brain: No evidence of acute infarction, hemorrhage, hydrocephalus, extra-axial collection or mass lesion/mass effect. Right posterior occipital parietal encephalomalacia, stable prior MRI. Patchy areas of bilateral white matter hypoattenuation consistent with mild chronic microvascular ischemic change. Vascular: No hyperdense vessel or unexpected calcification. Skull: Prior right occipital parietal craniotomy. No acute fracture. No bone lesion. Sinuses/Orbits: Globes and orbits are unremarkable. Sinuses are clear. Other: None. CT CERVICAL SPINE FINDINGS Alignment: Normal. Skull base and vertebrae: No cervical fracture. Old depression of the lower endplate of T3 and upper endplate of T4 stable from  a chest CT dated 04/16/2024. No bone lesion. Soft tissues and spinal canal: No prevertebral fluid or swelling. No visible canal hematoma. Disc levels: Mild loss of disc height at C5-C6 and C6-C7 mild endplate spurring. Remaining disc spaces are well preserved. Facet degenerative changes bilaterally. No convincing disc herniation. Upper chest: No acute finding. Other: None. IMPRESSION: HEAD CT 1. No acute intracranial abnormalities. CERVICAL CT 1. No acute fracture or acute finding. Electronically Signed   By: Alm Parkins M.D.   On: 09/04/2024 11:36   CT Cervical Spine Wo Contrast Result Date: 09/04/2024 CLINICAL DATA:  Fall. EXAM: CT HEAD WITHOUT CONTRAST CT CERVICAL SPINE WITHOUT CONTRAST TECHNIQUE: Multidetector CT imaging of the head and cervical  spine was performed following the standard protocol without intravenous contrast. Multiplanar CT image reconstructions of the cervical spine were also generated. RADIATION DOSE REDUCTION: This exam was performed according to the departmental dose-optimization program which includes automated exposure control, adjustment of the mA and/or kV according to patient size and/or use of iterative reconstruction technique. COMPARISON:  Brain MRI, 06/09/2024. FINDINGS: CT HEAD FINDINGS Brain: No evidence of acute infarction, hemorrhage, hydrocephalus, extra-axial collection or mass lesion/mass effect. Right posterior occipital parietal encephalomalacia, stable prior MRI. Patchy areas of bilateral white matter hypoattenuation consistent with mild chronic microvascular ischemic change. Vascular: No hyperdense vessel or unexpected calcification. Skull: Prior right occipital parietal craniotomy. No acute fracture. No bone lesion. Sinuses/Orbits: Globes and orbits are unremarkable. Sinuses are clear. Other: None. CT CERVICAL SPINE FINDINGS Alignment: Normal. Skull base and vertebrae: No cervical fracture. Old depression of the lower endplate of T3 and upper endplate of T4 stable  from a chest CT dated 04/16/2024. No bone lesion. Soft tissues and spinal canal: No prevertebral fluid or swelling. No visible canal hematoma. Disc levels: Mild loss of disc height at C5-C6 and C6-C7 mild endplate spurring. Remaining disc spaces are well preserved. Facet degenerative changes bilaterally. No convincing disc herniation. Upper chest: No acute finding. Other: None. IMPRESSION: HEAD CT 1. No acute intracranial abnormalities. CERVICAL CT 1. No acute fracture or acute finding. Electronically Signed   By: Alm Parkins M.D.   On: 09/04/2024 11:36   DG Knee Complete 4 Views Right Result Date: 09/04/2024 CLINICAL DATA:  Fall this morning.  Right knee pain. EXAM: RIGHT KNEE - COMPLETE 4+ VIEW COMPARISON:  None Available. FINDINGS: No fracture. Knee joint normally aligned. Mild medial joint space compartment. Small medial compartment marginal osteophytes. There is a well corticated ossified density in the posterior aspect of the knee joint. No joint effusion. Soft tissues are unremarkable. IMPRESSION: 1. No fracture or acute finding. 2. Mild medial compartment osteoarthritis. Suspected intra-articular ossified body lies along the posterior central aspect of the knee. Electronically Signed   By: Alm Parkins M.D.   On: 09/04/2024 10:58   DG Elbow Complete Right Result Date: 09/04/2024 CLINICAL DATA:  Pain. EXAM: RIGHT ELBOW - COMPLETE 3+ VIEW COMPARISON:  None Available. FINDINGS: No fracture or bone lesion. Elbow joint is normally spaced and aligned. No arthropathic changes. Soft tissues are unremarkable. IMPRESSION: Negative. Electronically Signed   By: Alm Parkins M.D.   On: 09/04/2024 10:56   DG Chest 1 View Result Date: 09/04/2024 CLINICAL DATA:  Short of breath.  Cough. EXAM: CHEST  1 VIEW COMPARISON:  11/05/2023 and prior exams.  CT, 07/19/2024. FINDINGS: Cardiac silhouette is normal in size. No convincing mediastinal or right hilar mass. Left hilum is retracted superiorly. There is opacity  in the left upper lung with associated volume loss. This is stable from the recent prior CT consistent post treatment scarring treatment of lung carcinoma. Mild linear opacity noted at the left lung base consistent with atelectasis. Right lung is clear. Right anterior chest wall Port-A-Cath is stable. Skeletal structures grossly intact. IMPRESSION: 1. No acute cardiopulmonary disease. 2. Lung cancer treatment related scarring/changes in the left upper lung, similar to the CT from 07/19/2024. Electronically Signed   By: Alm Parkins M.D.   On: 09/04/2024 10:56     Procedures   Medications Ordered in the ED  lactated ringers  bolus 500 mL (500 mLs Intravenous New Bag/Given 09/04/24 1216)  fentaNYL  (SUBLIMAZE ) injection 50 mcg (50 mcg Intravenous Given 09/04/24 1216)  HYDROmorphone (DILAUDID)  injection 0.5 mg (0.5 mg Intravenous Given 09/04/24 1248)                                    Medical Decision Making Cardiac monitor interpretation: Sinus tachycardia, occasional bigeminy and ectopy, this is patient's baseline  Patient here as a level 2 trauma alert after a fall.  She is on blood thinners.  Lost her balance fell hit her head on the shower.  She appears well no signs of external trauma.  Imaging negative.  Vitals are stable.  She did have some pain and she was given fentanyl  and Dilaudid which did improve her back pain.  Discussed results and plan with family at bedside.  They do not want muscle relaxers, she does have oxycodone  at home and they will plan to take this.  Patient and family bedside agreeable with plan for discharge.  Problems Addressed: Acute bilateral back pain, unspecified back location: acute illness or injury Fall, initial encounter: acute illness or injury History of COPD: chronic illness or injury  Amount and/or Complexity of Data Reviewed External Data Reviewed: notes.    Details: Prior office records reviewed and patient follows up with oncology for  malignancy Labs: ordered. Decision-making details documented in ED Course.    Details: Ordered and reviewed by me and fairly unremarkable Radiology: ordered and independent interpretation performed. Decision-making details documented in ED Course.    Details: Ordered and reviewed by me CT lumbar spine: Shows no acute abnormality CT head: Shows no acute abnormality CT C-spine: Shows no acute abnormality  Ordered and interpreted by me independently radiology Chest x-ray is unremarkable for acute abnormality ECG/medicine tests: ordered and independent interpretation performed. Decision-making details documented in ED Course.    Details: Ordered and interpreted by me in the absence of cardiology and EKG shows sinus rhythm, no STEMI or significant change when compared to prior  Risk OTC drugs. Prescription drug management. Parenteral controlled substances. Drug therapy requiring intensive monitoring for toxicity. Decision regarding hospitalization.      Final diagnoses:  Fall, initial encounter  Acute bilateral back pain, unspecified back location  History of COPD    ED Discharge Orders     None          Gennaro Duwaine CROME, DO 09/04/24 1506

## 2024-09-04 NOTE — ED Notes (Signed)
 Pt asked to keep PureWick on while going home through [facility]. RN and NT told Pt that was not possible, and pt removed the PureWick.

## 2024-09-04 NOTE — ED Notes (Signed)
 Pt leaving for home in care of ptar. No new onset distress at this time.AVS paper work given to pt earlier and at bedside.

## 2024-09-04 NOTE — Progress Notes (Signed)
 Orthopedic Tech Progress Note Patient Details:  Laurie  Mccoy 1940/11/21 [id number] Level 2 Trauma  Patient ID: Laurie Mccoy, female   DOB: 1941-07-18, 83 y.o.   MRN: [id number]  [name] 09/04/2024, 10:03 AM

## 2024-09-04 NOTE — ED Notes (Signed)
 X-ray at bedside.

## 2024-09-04 NOTE — ED Triage Notes (Signed)
 Per EMS, Pt, from home, c/o R head, R elbow, and R knee pain following a mechanical fall this morning.  Pt reports hitting her head on her shower door.  Denies LOC.  Pt takes Eliquis .

## 2024-09-04 NOTE — ED Notes (Signed)
 Pt assisted with using the PureWick. NT hooked up PureWick and Pt put it in place herself. NT then changed the linen and straightened the bed and pulled the Pt up in the bed. NT then gave Pt graham crackers and a [employer].   Pt requested that the PureWick be turned off due to noise. PureWick still in place, and Pt knows to call for assistance to turn it back on if she needs to urinate.

## 2024-09-04 NOTE — ED Notes (Addendum)
Patient transported to CT by Trauma RN 

## 2024-09-04 NOTE — Discharge Instructions (Addendum)
 Stay on your medications as prescribed.  You can use your pain medicine that you have at home as needed.

## 2024-09-04 NOTE — ED Notes (Signed)
 Patient transported to CT

## 2024-09-24 ENCOUNTER — Telehealth: Payer: Self-pay

## 2024-09-24 ENCOUNTER — Other Ambulatory Visit: Payer: Self-pay | Admitting: Physician Assistant

## 2024-09-24 DIAGNOSIS — I8290 Acute embolism and thrombosis of unspecified vein: Secondary | ICD-10-CM

## 2024-09-24 MED ORDER — APIXABAN 5 MG PO TABS: 5.0000 mg | ORAL_TABLET | Freq: Two times a day (BID) | ORAL | 3 refills | Status: AC

## 2024-09-24 NOTE — Telephone Encounter (Signed)
 Spoke with patient regarding refill request for Eliquis . Patient reports that the pharmacy is requesting a new prescription be sent to [facility] in [place], [place]. Information relayed to [place], [place], for review.  Patient was reminded that she needs to schedule her scan for January. Provided patient with the phone number for centralized scheduling and instructed her to call to arrange the appointment. Patient verbalized understanding.

## 2024-10-04 NOTE — Progress Notes (Incomplete)
 Laurie Mccoy presents today for follow up after completing SRS radiation treatment on 06/23/2024.  Recent neurologic symptoms, if any:  Seizures: {:18581} Headaches: {:18581} Nausea: {:18581} Dizziness/ataxia: {:18581} Difficulty with hand coordination: {:18581} Focal numbness/weakness: {:18581} Visual deficits/changes: {:18581} Confusion/Memory deficits: {:18581}  Other issues of note:

## 2024-10-05 ENCOUNTER — Ambulatory Visit (HOSPITAL_COMMUNITY)

## 2024-10-11 ENCOUNTER — Inpatient Hospital Stay

## 2024-10-12 ENCOUNTER — Ambulatory Visit: Admitting: Radiation Oncology

## 2024-10-13 ENCOUNTER — Encounter: Payer: Self-pay | Admitting: Internal Medicine

## 2024-10-19 ENCOUNTER — Inpatient Hospital Stay

## 2024-10-21 ENCOUNTER — Inpatient Hospital Stay

## 2024-10-21 ENCOUNTER — Inpatient Hospital Stay: Attending: Internal Medicine

## 2024-10-21 ENCOUNTER — Ambulatory Visit (HOSPITAL_COMMUNITY)
Admission: RE | Admit: 2024-10-21 | Discharge: 2024-10-21 | Disposition: A | Discharge status: Home / Self Care | Source: Ambulatory Visit | Attending: Internal Medicine | Admitting: Internal Medicine

## 2024-10-21 DIAGNOSIS — J4489 Other specified chronic obstructive pulmonary disease: Secondary | ICD-10-CM | POA: Diagnosis not present

## 2024-10-21 DIAGNOSIS — E041 Nontoxic single thyroid nodule: Secondary | ICD-10-CM | POA: Diagnosis not present

## 2024-10-21 DIAGNOSIS — Z87442 Personal history of urinary calculi: Secondary | ICD-10-CM | POA: Insufficient documentation

## 2024-10-21 DIAGNOSIS — N281 Cyst of kidney, acquired: Secondary | ICD-10-CM | POA: Insufficient documentation

## 2024-10-21 DIAGNOSIS — J9611 Chronic respiratory failure with hypoxia: Secondary | ICD-10-CM | POA: Insufficient documentation

## 2024-10-21 DIAGNOSIS — Z79899 Other long term (current) drug therapy: Secondary | ICD-10-CM | POA: Diagnosis not present

## 2024-10-21 DIAGNOSIS — J439 Emphysema, unspecified: Secondary | ICD-10-CM | POA: Insufficient documentation

## 2024-10-21 DIAGNOSIS — J479 Bronchiectasis, uncomplicated: Secondary | ICD-10-CM | POA: Diagnosis not present

## 2024-10-21 DIAGNOSIS — M4854XA Collapsed vertebra, not elsewhere classified, thoracic region, initial encounter for fracture: Secondary | ICD-10-CM | POA: Diagnosis not present

## 2024-10-21 DIAGNOSIS — N854 Malposition of uterus: Secondary | ICD-10-CM | POA: Diagnosis not present

## 2024-10-21 DIAGNOSIS — Z888 Allergy status to other drugs, medicaments and biological substances status: Secondary | ICD-10-CM | POA: Insufficient documentation

## 2024-10-21 DIAGNOSIS — Z923 Personal history of irradiation: Secondary | ICD-10-CM | POA: Insufficient documentation

## 2024-10-21 DIAGNOSIS — I700 Atherosclerosis of aorta: Secondary | ICD-10-CM | POA: Insufficient documentation

## 2024-10-21 DIAGNOSIS — F419 Anxiety disorder, unspecified: Secondary | ICD-10-CM | POA: Diagnosis not present

## 2024-10-21 DIAGNOSIS — I10 Essential (primary) hypertension: Secondary | ICD-10-CM | POA: Insufficient documentation

## 2024-10-21 DIAGNOSIS — Z7901 Long term (current) use of anticoagulants: Secondary | ICD-10-CM | POA: Insufficient documentation

## 2024-10-21 DIAGNOSIS — C7931 Secondary malignant neoplasm of brain: Secondary | ICD-10-CM | POA: Insufficient documentation

## 2024-10-21 DIAGNOSIS — C3412 Malignant neoplasm of upper lobe, left bronchus or lung: Secondary | ICD-10-CM | POA: Insufficient documentation

## 2024-10-21 DIAGNOSIS — Z9089 Acquired absence of other organs: Secondary | ICD-10-CM | POA: Insufficient documentation

## 2024-10-21 DIAGNOSIS — T451X5A Adverse effect of antineoplastic and immunosuppressive drugs, initial encounter: Secondary | ICD-10-CM | POA: Diagnosis not present

## 2024-10-21 DIAGNOSIS — M4856XA Collapsed vertebra, not elsewhere classified, lumbar region, initial encounter for fracture: Secondary | ICD-10-CM | POA: Insufficient documentation

## 2024-10-21 DIAGNOSIS — G8190 Hemiplegia, unspecified affecting unspecified side: Secondary | ICD-10-CM | POA: Diagnosis not present

## 2024-10-21 DIAGNOSIS — Z9049 Acquired absence of other specified parts of digestive tract: Secondary | ICD-10-CM | POA: Insufficient documentation

## 2024-10-21 DIAGNOSIS — G4730 Sleep apnea, unspecified: Secondary | ICD-10-CM | POA: Insufficient documentation

## 2024-10-21 DIAGNOSIS — G4700 Insomnia, unspecified: Secondary | ICD-10-CM | POA: Insufficient documentation

## 2024-10-21 DIAGNOSIS — Z9981 Dependence on supplemental oxygen: Secondary | ICD-10-CM | POA: Insufficient documentation

## 2024-10-21 DIAGNOSIS — C3490 Malignant neoplasm of unspecified part of unspecified bronchus or lung: Secondary | ICD-10-CM | POA: Insufficient documentation

## 2024-10-21 DIAGNOSIS — I2510 Atherosclerotic heart disease of native coronary artery without angina pectoris: Secondary | ICD-10-CM | POA: Diagnosis not present

## 2024-10-21 DIAGNOSIS — G620 Drug-induced polyneuropathy: Secondary | ICD-10-CM | POA: Diagnosis not present

## 2024-10-21 DIAGNOSIS — K439 Ventral hernia without obstruction or gangrene: Secondary | ICD-10-CM | POA: Insufficient documentation

## 2024-10-21 LAB — CMP (CANCER CENTER ONLY)
ALT: 8 U/L (ref 0–44)
AST: 12 U/L — ABNORMAL LOW (ref 15–41)
Albumin: 3.8 g/dL (ref 3.5–5.0)
Alkaline Phosphatase: 82 U/L (ref 38–126)
Anion gap: 10 (ref 5–15)
BUN: 14 mg/dL (ref 8–23)
CO2: 27 mmol/L (ref 22–32)
Calcium: 9.2 mg/dL (ref 8.9–10.3)
Chloride: 99 mmol/L (ref 98–111)
Creatinine: 0.52 mg/dL (ref 0.44–1.00)
GFR, Estimated: >60 mL/min
Glucose, Bld: 156 mg/dL — ABNORMAL HIGH (ref 70–99)
Potassium: 4.2 mmol/L (ref 3.5–5.1)
Sodium: 136 mmol/L (ref 135–145)
Total Bilirubin: 0.4 mg/dL (ref 0.0–1.2)
Total Protein: 6.5 g/dL (ref 6.5–8.1)

## 2024-10-21 LAB — CBC WITH DIFFERENTIAL (CANCER CENTER ONLY)
Abs Immature Granulocytes: 0.02 K/uL (ref 0.00–0.07)
Basophils Absolute: 0.1 K/uL (ref 0.0–0.1)
Basophils Relative: 1 %
Eosinophils Absolute: 0.2 K/uL (ref 0.0–0.5)
Eosinophils Relative: 3 %
HCT: 36.7 % (ref 36.0–46.0)
Hemoglobin: 11.7 g/dL — ABNORMAL LOW (ref 12.0–15.0)
Immature Granulocytes: 0 %
Lymphocytes Relative: 21 %
Lymphs Abs: 1.4 K/uL (ref 0.7–4.0)
MCH: 29.2 pg (ref 26.0–34.0)
MCHC: 31.9 g/dL (ref 30.0–36.0)
MCV: 91.5 fL (ref 80.0–100.0)
Monocytes Absolute: 0.5 K/uL (ref 0.1–1.0)
Monocytes Relative: 7 %
Neutro Abs: 4.5 K/uL (ref 1.7–7.7)
Neutrophils Relative %: 68 %
Platelet Count: 282 K/uL (ref 150–400)
RBC: 4.01 MIL/uL (ref 3.87–5.11)
RDW: 13.6 % (ref 11.5–15.5)
WBC Count: 6.6 K/uL (ref 4.0–10.5)
nRBC: 0 % (ref 0.0–0.2)

## 2024-10-21 MED ORDER — IOHEXOL 300 MG/ML  SOLN
100.0000 mL | Freq: Once | INTRAMUSCULAR | Status: AC | PRN
  Administered 2024-10-21: 100 mL via INTRAVENOUS

## 2024-10-21 MED ORDER — HEPARIN SOD (PORK) LOCK FLUSH 100 UNIT/ML IV SOLN
500.0000 [IU] | Freq: Once | INTRAVENOUS | Status: AC
  Administered 2024-10-21: 500 [IU] via INTRAVENOUS

## 2024-10-26 ENCOUNTER — Inpatient Hospital Stay: Admitting: Internal Medicine

## 2024-10-26 VITALS — BP 123/75 | HR 103 | Temp 98.2°F | Resp 17 | Ht 65.0 in | Wt 190.0 lb

## 2024-10-26 DIAGNOSIS — C3412 Malignant neoplasm of upper lobe, left bronchus or lung: Secondary | ICD-10-CM | POA: Diagnosis not present

## 2024-10-26 DIAGNOSIS — C3490 Malignant neoplasm of unspecified part of unspecified bronchus or lung: Secondary | ICD-10-CM

## 2024-10-26 NOTE — Progress Notes (Signed)
 "     [facility] [phone]   [phone]  OFFICE PROGRESS NOTE  [name], NP [phone] 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   Laurie Mccoy   Detected Alteration(s) / Biomarker(s)          Associated FDA-approved therapies  Clinical Trial Availability          % cfDNA or Amplification   KRAS G12C approved by FDA Adagrasib, Sotorasib Yes    1.7%   TP53 R249S None Yes          1.5%   PRIOR THERAPY:  1) SRS the the metastatic brain lesion under the care of Dr. Izell 2) Craniotomy on 07/10/22 under the care of Dr. Carollee 3) radiation to the chest under care of Dr. Shannon.  Last dose on 08/23/2022 4) Systemic chemotherapy with carboplatin  for an AUC of 5,  Alimta  500 mg/m2, and Keytruda  200 mg IV every 3 weeks.  First dose expected on October 08, 2022. Starting from cycle #5 she will be on maintenance treatment with Alimta  and Keytruda  every 3 weeks. Status post 17 cycles. Will reduce her dose of alimta  to 400 mg/m2 secondary to intolerance. Last dose of treatment was given on November 03, 2023 discontinued secondary to intolerance.  CURRENT THERAPY: Observation.  INTERVAL HISTORY: Laurie  Mccoy 84 y.o. female returns to the clinic today for follow-up visit accompanied by her husband.Discussed the use of AI scribe software for clinical note transcription with the patient, who gave verbal consent to proceed.  History of Present Illness Laurie  Mccoy is an 84 year old female with stage IV non-small cell lung cancer who presents for routine follow-up and disease restaging.  She discontinued Alimta  and Keytruda  in January 2025 due to intolerance and has been off active therapy for  the past year.  She continues to experience chronic dyspnea and requires supplemental oxygen, with no significant change in her respiratory status since her last visit.  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 She does not use gabapentin , Lyrica, or tramadol  due to intolerance or lack of efficacy, and instead uses an unspecified herbal remedy provided by her husband. Oxycodone  is listed among her medications, but current use for neuropathy is not specified.  She endorses ongoing insomnia, characterized by frequent nighttime awakenings and difficulty returning to sleep. She has tried magnesium  supplementation without benefit and is not currently using prescription sleep aids. She previously used lorazepam  but is not currently taking it, and has not tried melatonin or Tylenol  PM. She notes that sleep disturbance, neuropathy, and oxygen dependence limit her ability to exercise.    MEDICAL HISTORY: Past Medical History:  Diagnosis Date   Anxiety    had panic attacks years ago   Asthma    COPD (chronic obstructive pulmonary disease) (HCC)    Goiter 2022   History of hiatal hernia    dx in college, never bothered me   History of kidney stones 2012   History of radiation therapy    Left Lung-07/10/22-08/23/22- Dr. Lynwood Shannon   Hypertension    Lung cancer [facility])    Sleep apnea     ALLERGIES:  is allergic to compazine  [  prochlorperazine ], norvasc  [amlodipine ], and trelegy ellipta  [fluticasone -umeclidin-vilant].  MEDICATIONS:  Current Outpatient Medications  Medication Sig Dispense Refill   HYDROcodone -acetaminophen  (NORCO/VICODIN) 5-325 MG tablet Take 1 tablet by mouth.     albuterol  (VENTOLIN  HFA) 108 (90 Base) MCG/ACT inhaler Inhale 2 puffs into the lungs every 6 (six) hours as needed for wheezing or shortness of breath. 18 g 0   ALPRAZolam  (XANAX ) 0.25 MG tablet Take 0.25  mg by mouth at bedtime as needed for anxiety.     apixaban  (ELIQUIS ) 5 MG TABS tablet Take 1 tablet (5 mg total) by mouth 2 (two) times daily. 60 tablet 3   Budeson-Glycopyrrol-Formoterol  (BREZTRI  AEROSPHERE) 160-9-4.8 MCG/ACT AERO INHALE 2 PUFFS BY MOUTH EVERY MORNING AND EVERY NIGHT AT BEDTIME (Patient taking differently: Inhale 2 puffs into the lungs in the morning and at bedtime.) 10.7 g 5   folic acid  (FOLVITE ) 1 MG tablet TAKE ONE TABLET BY MOUTH EVERY DAY 30 tablet 2   furosemide  (LASIX ) 20 MG tablet 1 tablet p.o. daily as needed for increased leg swelling. 20 tablet 0   gabapentin  (NEURONTIN ) 100 MG capsule Take 1 capsule (100 mg total) by mouth 2 (two) times daily. 90 capsule 2   hydrOXYzine  (ATARAX ) 10 MG tablet Take 1 tablet (10 mg total) by mouth 3 (three) times daily as needed. 60 tablet 2   ipratropium-albuterol  (DUONEB) 0.5-2.5 (3) MG/3ML SOLN Take 3 mLs by nebulization every 6 (six) hours as needed. (Patient not taking: Reported on 04/30/2024) 360 mL 0   levocetirizine (XYZAL) 5 MG tablet Take 5 mg by mouth at bedtime as needed (for seasonal allergies- if not taking generic Claritin ).     lidocaine -prilocaine  (EMLA ) cream Apply 1 Application topically as needed. 30 g 2   LORazepam  (ATIVAN ) 1 MG tablet Take 1 tablet (1 mg total) by mouth as needed. 30 min Prior to MRI or brain radiation procedure 5 tablet 0   LORazepam  (ATIVAN ) 1 MG tablet Take 1 tablet (1 mg total) by mouth as needed for anxiety. Take 1 tablet (0.5 mg total) by mouth 20 minutes prior to radiation treatment. Do not drive after taking medication. 3 tablet 0   methocarbamol  (ROBAXIN ) 500 MG tablet Take 1 tablet (500 mg total) by mouth every 8 (eight) hours as needed for muscle spasms. 20 tablet 0   ondansetron  (ZOFRAN ) 8 MG tablet Take 1 tablet (8 mg total) by mouth every 8 (eight) hours as needed for nausea or vomiting. 30 tablet 2   oxyCODONE  (OXY IR/ROXICODONE ) 5 MG immediate release tablet Take 1 tablet (5 mg total) by  mouth every 4 (four) hours as needed for severe pain. 12 tablet 0   pantoprazole  (PROTONIX ) 20 MG tablet Take 20 mg by mouth every evening.     predniSONE  (DELTASONE ) 10 MG tablet Take 2 tablets (20 mg total) by mouth daily. (Patient taking differently: Take 20 mg by mouth as needed.) 20 tablet 0   telmisartan  (MICARDIS ) 40 MG tablet Take 1 tablet (40 mg total) by mouth daily. 90 tablet 3   traMADol  (ULTRAM ) 50 MG tablet Take 2 tablets (100 mg total) by mouth every 6 (six) hours as needed for severe pain. (Patient not taking: Reported on 10/26/2024) 30 tablet 0   Zinc  Acetate, Oral, (ZINC  ACETATE PO) Take 1 tablet by mouth daily. Received from herbalist     No current facility-administered medications for this visit.    SURGICAL HISTORY:  Past Surgical History:  Procedure Laterality Date   APPENDECTOMY     APPLICATION  OF CRANIAL NAVIGATION N/A 07/11/2022   Procedure: APPLICATION OF CRANIAL NAVIGATION;  Surgeon: Dawley, Lani BROCKS, DO;  Location: [facility] OR;  Service: Neurosurgery;  Laterality: N/A;   BRONCHIAL BIOPSY  06/12/2021   Procedure: BRONCHIAL BIOPSIES;  Surgeon: Brenna Adine CROME, DO;  Location: [facility] ENDOSCOPY;  Service: Pulmonary;;   BRONCHIAL BRUSHINGS  06/12/2021   Procedure: BRONCHIAL BRUSHINGS;  Surgeon: Brenna Adine CROME, DO;  Location: [facility] END[facility];  Service: Pulmonary;;   BRONCHIAL WASHINGS  06/12/2021   Procedure: BRONCHIAL WASHINGS;  Surgeon: Brenna Adine CROME, DO;  Location: [facility];  Service: Pulmonary;;   CHOLECYSTECTOMY     COLON RESECTION     CRANIOTOMY N/A 07/11/2022   Procedure: Craniotomy for resection of tumor;  Surgeon: Carollee Lani BROCKS, DO;  Location: [facility] OR;  Service: Neurosurgery;  Laterality: N/A;  RM 19   FIDUCIAL MARKER PLACEMENT  06/12/2021   Procedure: FIDUCIAL MARKER PLACEMENT;  Surgeon: Brenna Adine CROME, DO;  Location: [facility];  Service: Pulmonary;;   hernia     x 2   IR IMAGING GUIDED PORT INSERTION  10/11/2022   TONSILLECTOMY     VIDEO BRONCHOSCOPY WITH  ENDOBRONCHIAL NAVIGATION Bilateral 06/12/2021   Procedure: VIDEO BRONCHOSCOPY WITH ENDOBRONCHIAL NAVIGATION;  Surgeon: Brenna Adine CROME, DO;  Location: [facility];  Service: Pulmonary;  Laterality: Bilateral;  ION   VIDEO BRONCHOSCOPY WITH RADIAL ENDOBRONCHIAL ULTRASOUND  06/12/2021   Procedure: RADIAL ENDOBRONCHIAL ULTRASOUND;  Surgeon: Brenna Adine CROME, DO;  Location: [facility];  Service: Pulmonary;;    REVIEW OF SYSTEMS:  Constitutional: positive for fatigue Eyes: negative Ears, nose, mouth, throat, and face: negative Respiratory: positive for dyspnea on exertion Cardiovascular: negative Gastrointestinal: negative Genitourinary:negative Integument/breast: negative Hematologic/lymphatic: negative Musculoskeletal:positive for arthralgias, back pain, and muscle weakness Neurological: negative Behavioral/Psych: positive for sleep disturbance Endocrine: negative Allergic/Immunologic: negative   PHYSICAL EXAMINATION: General appearance: alert, cooperative, fatigued, and no distress Head: Normocephalic, without obvious abnormality, atraumatic Neck: no adenopathy, no JVD, supple, symmetrical, trachea midline, and thyroid  not enlarged, symmetric, no tenderness/mass/nodules Lymph nodes: Cervical, supraclavicular, and axillary nodes normal. Resp: rhonchi bilaterally Back: symmetric, no curvature. ROM normal. No CVA tenderness. Cardio: regular rate and rhythm, S1, S2 normal, no murmur, click, rub or gallop GI: soft, non-tender; bowel sounds normal; no masses,  no organomegaly Extremities: extremities normal, atraumatic, no cyanosis or edema Neurologic: Alert and oriented X 3, normal strength and tone. Normal symmetric reflexes. Normal coordination and gait  ECOG PERFORMANCE STATUS: 1 - Symptomatic but completely ambulatory  Blood pressure 123/75, pulse (!) 103, temperature 98.2 F (36.8 C), temperature source Temporal, resp. rate 17, height 5' 5 (1.651 m), weight 190 lb (86.2 kg),  SpO2 92%.  LABORATORY DATA: Lab Results  Component Value Date   WBC 6.6 10/21/2024   HGB 11.7 (L) 10/21/2024   HCT 36.7 10/21/2024   MCV 91.5 10/21/2024   PLT 282 10/21/2024      Chemistry      Component Value Date/Time   NA 136 10/21/2024 1416   NA 139 09/17/2019 1550   K 4.2 10/21/2024 1416   CL 99 10/21/2024 1416   CO2 27 10/21/2024 1416   BUN 14 10/21/2024 1416   BUN 9 09/17/2019 1550   CREATININE 0.52 10/21/2024 1416      Component Value Date/Time   CALCIUM  9.2 10/21/2024 1416   ALKPHOS 82 10/21/2024 1416   AST 12 (L) 10/21/2024 1416   ALT 8 10/21/2024 1416   BILITOT 0.4 10/21/2024 1416       RADIOGRAPHIC STUDIES:  CT CHEST ABDOMEN PELVIS W CONTRAST Result Date: 10/21/2024 CLINICAL DATA:  Non-small cell lung cancer (NSCLC), staging. * Tracking Code: BO * EXAM: CT CHEST, ABDOMEN, AND PELVIS WITH CONTRAST TECHNIQUE: Multidetector CT imaging of the chest, abdomen and pelvis was performed following the standard protocol during bolus administration of intravenous contrast. RADIATION DOSE REDUCTION: This exam was performed according to the departmental dose-optimization program which includes automated exposure control, adjustment of the mA and/or kV according to patient size and/or use of iterative reconstruction technique. CONTRAST:  OMNIPAQUE  IOHEXOL  300 MG/ML  SOLN COMPARISON:  CT scan chest, abdomen and pelvis from 08/05/2024. FINDINGS: CT CHEST FINDINGS Cardiovascular: Normal cardiac size. No pericardial effusion. No aortic aneurysm. There are coronary artery calcifications, in keeping with coronary artery disease. There are also moderate to severe peripheral atherosclerotic vascular calcifications of thoracic aorta and its major branches. Mediastinum/Nodes: Redemonstration of 1.6 x 2.2 cm hypoattenuating left thyroid  nodule, incompletely characterized on the current exam but unchanged since the prior study. The nodule meets the size criteria for nonemergent thyroid   ultrasound evaluation, unless previously performed. No solid / cystic mediastinal masses. The esophagus is nondistended precluding optimal assessment. No axillary, mediastinal or hilar lymphadenopathy by size criteria. Lungs/Pleura: The central tracheo-bronchial tree is patent. Redemonstration of large opacity in the left upper lobe with associated bronchiectasis without significant interval change. There are mild-to-moderate upper lobe predominant emphysematous changes. There is mild bilateral pleural effusion, grossly similar to the prior study. There are associated areas of rounded atelectasis in the bilateral lower lobes, posterior inferiorly, also unchanged. Redemonstration of fiducial marker and adjacent irregular opacities in the right upper lobe, posterior segment without interval change. No new mass or consolidation. No pleural effusion or pneumothorax. No suspicious lung nodules. Musculoskeletal: The visualized soft tissues of the chest wall are grossly unremarkable. No suspicious osseous lesions. There are moderate multilevel degenerative changes in the visualized spine. Redemonstration of multiple, mild-to-moderate vertebral compression deformities, for example, T1 (new), T3, T4, T6, T7, T8 (new) etc. No significant retropulsion or spinal canal compromise. CT ABDOMEN PELVIS FINDINGS Hepatobiliary: The liver is normal in size. Non-cirrhotic configuration. No suspicious mass. No intrahepatic or extrahepatic bile duct dilation. Gallbladder is surgically absent. Pancreas: Unremarkable. No pancreatic ductal dilatation or surrounding inflammatory changes. Spleen: Within normal limits. No focal lesion. Adrenals/Urinary Tract: Adrenal glands are unremarkable. There is a 3 mm calcification in the right kidney interpolar region, posteriorly, which may represent renal calculus versus vascular calcification. There is vascular calcification in the left kidney interpolar region. No other nephroureterolithiasis on  either side. There is a partially exophytic cyst arising from the left kidney interpolar region, anteriorly measuring up to 9 x 11 mm no suspicious renal mass. Unremarkable urinary bladder. Stomach/Bowel: No disproportionate dilation of the small or large bowel loops. No evidence of abnormal bowel wall thickening or inflammatory changes. The appendix was not visualized; however there is no acute inflammatory process in the right lower quadrant. Rectosigmoid anastomotic suture noted. There are multiple diverticula mainly in the left hemi colon, without imaging signs of diverticulitis. There is fecaloma in the rectum measuring up to 7.6 cm. Vascular/Lymphatic: No ascites or pneumoperitoneum. No abdominal or pelvic lymphadenopathy, by size criteria. No aneurysmal dilation of the major abdominal arteries. There are marked peripheral atherosclerotic vascular calcifications of the aorta and its major branches. Reproductive: Evaluation of reported organs is limited on the CT scan exam. However, having said that note is made of retroverted uterus with endometrial thickness measuring up to 1.2 cm, which  is abnormal in the postmenopausal patient. Findings are grossly similar to the prior study. Correlate clinically to determine the need for further evaluation with pelvic ultrasound. No large adnexal mass seen. Other: There is right paramedian periumbilical ventral hernia containing portion of unobstructed transverse colon. No interval change. Superior to it, there is a small fat containing right paramedian ventral hernia. The soft tissues and abdominal wall are otherwise unremarkable. Musculoskeletal: No suspicious osseous lesions. There is new mild superior endplate compression deformity of L1 vertebra. No significant retropulsion or spinal canal compromise. There are mild - moderate multilevel degenerative changes in the visualized spine. IMPRESSION: 1. Redemonstration of large opacity in the left upper lobe with associated  bronchiectasis without significant interval change. Findings are compatible with posttreatment/postradiation changes. 2. Redemonstration of fiducial marker and adjacent irregular opacities in the right upper lobe, posterior segment without interval change. 3. No new lung mass, consolidation, pleural effusion or pneumothorax. No suspicious lung nodule. 4. No metastatic disease identified within the abdomen or pelvis. 5. Multiple other nonacute observations (such as stable left thyroid  nodule, meeting the size criteria for nonemergent targeted ultrasound evaluation, new vertebral compression deformities of T1, T8 and L1 vertebrae without significant retropulsion or spinal canal compromise, left renal cyst, thickened endometrium needing clinical correlation with or without further evaluation with pelvic ultrasound, etc.) as described above. Aortic Atherosclerosis (ICD10-I70.0). Electronically Signed   By: Ree Molt M.D.   On: 10/21/2024 16:38    ASSESSMENT AND PLAN: This is a 84 years old white female with Stage IV (T2 a, N3, M1b) non-small cell lung cancer, adenocarcinoma presented with left upper lobe perihilar mass in addition to left hilar and mediastinal lymphadenopathy as well as left supraclavicular lymphadenopathy with brain metastasis diagnosed in September 2023.   . The patient is status post SRS to brain metastasis followed by craniotomy followed by palliative radiotherapy to the left lung mass.  She was then started on systemic chemotherapy initially with carboplatin , Alimta  and Keytruda  for 4 cycles followed by maintenance treatment with Alimta  and Keytruda  starting from cycle #5 for a total of 17 cycles.  She has been off treatment since late January 2025 secondary to intolerance. She had repeat PET scan performed recently that showed no concerning findings for disease progression and significant improvement of her disease compared to the initial studies. The patient is currently on observation  and she is feeling fine except for the baseline fatigue. The patient had repeat CT scan of the chest, abdomen and pelvis performed recently.  I personally independently reviewed the scan and discussed the result with the patient and her husband today.  Her scan showed no concerning findings for disease progression. Assessment and Plan Assessment & Plan Non-small cell lung cancer Metastatic disease previously treated with pemetrexed  and pembrolizumab , discontinued due to intolerance. She has been under observation for the past year. Recent imaging demonstrates stable disease without progression or new metastases. She remains asymptomatic from her malignancy. - Ordered repeat CT chest, abdomen, and pelvis for restaging, which demonstrated stable disease. - Planned follow-up in four months. - Continued observation without active oncologic therapy.  Chemotherapy-induced peripheral neuropathy She experiences persistent, bothersome neuropathy in her legs. She is not using prescription medications due to intolerance or lack of efficacy, and currently uses a non-prescription herbal remedy. - Discussed pharmacologic options including gabapentin , tramadol , and oxycodone . - Reviewed current use of non-prescription herbal remedy.  Chronic respiratory failure with hypoxemia She remains chronically dyspneic and requires supplemental oxygen. Her respiratory status is unchanged  from previous visits. - Continued supplemental oxygen therapy.  Insomnia She reports ongoing sleep disturbance with frequent awakenings and difficulty returning to sleep. She is not using prescription hypnotics. Non-pharmacologic and over-the-counter options were discussed. Lorazepam  was reviewed and not recommended due to risk of dependence and respiratory suppression. - Advised against use of lorazepam  and other anxiolytics for sleep due to risk of dependence and respiratory suppression.  The patient was advised to call immediately  if she has any other concerning symptoms in the interval. The patient voices understanding of current disease status and treatment options and is in agreement with the current care plan.  All questions were answered. The patient knows to call the clinic with any problems, questions or concerns. We can certainly see the patient much sooner if necessary. The total time spent in the appointment was 30 minutes including review of chart and various tests results, discussions about plan of care and coordination of care plan .   Disclaimer: This note was dictated with voice recognition software. Similar sounding words can inadvertently be transcribed and may not be corrected upon review.        "

## 2024-10-27 ENCOUNTER — Telehealth: Payer: Self-pay | Admitting: Internal Medicine

## 2024-10-27 NOTE — Telephone Encounter (Signed)
 Called pt to sched lab and MD visit for 5/12 and 5/20 but pt requested to wait til closer date

## 2024-12-24 ENCOUNTER — Inpatient Hospital Stay: Attending: Internal Medicine

## 2025-02-23 ENCOUNTER — Inpatient Hospital Stay: Attending: Internal Medicine
